# Patient Record
Sex: Female | Born: 1950
Health system: Southern US, Community
[De-identification: ages and names within clinical notes are randomized; demographics above are authoritative.]

## PROBLEM LIST (undated history)

## (undated) DIAGNOSIS — E119 Type 2 diabetes mellitus without complications: Secondary | ICD-10-CM

## (undated) DIAGNOSIS — I10 Essential (primary) hypertension: Secondary | ICD-10-CM

## (undated) DIAGNOSIS — F32A Depression, unspecified: Secondary | ICD-10-CM

## (undated) DIAGNOSIS — I2510 Atherosclerotic heart disease of native coronary artery without angina pectoris: Secondary | ICD-10-CM

## (undated) DIAGNOSIS — N182 Chronic kidney disease, stage 2 (mild): Secondary | ICD-10-CM

## (undated) DIAGNOSIS — I219 Acute myocardial infarction, unspecified: Secondary | ICD-10-CM

## (undated) DIAGNOSIS — F329 Major depressive disorder, single episode, unspecified: Secondary | ICD-10-CM

## (undated) DIAGNOSIS — G4733 Obstructive sleep apnea (adult) (pediatric): Secondary | ICD-10-CM

## (undated) DIAGNOSIS — F419 Anxiety disorder, unspecified: Secondary | ICD-10-CM

## (undated) DIAGNOSIS — G43909 Migraine, unspecified, not intractable, without status migrainosus: Secondary | ICD-10-CM

## (undated) DIAGNOSIS — D649 Anemia, unspecified: Secondary | ICD-10-CM

## (undated) DIAGNOSIS — J189 Pneumonia, unspecified organism: Secondary | ICD-10-CM

## (undated) DIAGNOSIS — M1990 Unspecified osteoarthritis, unspecified site: Secondary | ICD-10-CM

## (undated) DIAGNOSIS — R51 Headache: Secondary | ICD-10-CM

## (undated) DIAGNOSIS — K219 Gastro-esophageal reflux disease without esophagitis: Secondary | ICD-10-CM

## (undated) DIAGNOSIS — R0789 Other chest pain: Secondary | ICD-10-CM

## (undated) DIAGNOSIS — Z9189 Other specified personal risk factors, not elsewhere classified: Secondary | ICD-10-CM

## (undated) DIAGNOSIS — Z9989 Dependence on other enabling machines and devices: Secondary | ICD-10-CM

## (undated) DIAGNOSIS — H3500 Unspecified background retinopathy: Secondary | ICD-10-CM

## (undated) DIAGNOSIS — E785 Hyperlipidemia, unspecified: Secondary | ICD-10-CM

## (undated) DIAGNOSIS — E669 Obesity, unspecified: Secondary | ICD-10-CM

## (undated) DIAGNOSIS — G2581 Restless legs syndrome: Secondary | ICD-10-CM

## (undated) DIAGNOSIS — E079 Disorder of thyroid, unspecified: Secondary | ICD-10-CM

## (undated) DIAGNOSIS — I5042 Chronic combined systolic (congestive) and diastolic (congestive) heart failure: Secondary | ICD-10-CM

## (undated) DIAGNOSIS — N189 Chronic kidney disease, unspecified: Secondary | ICD-10-CM

## (undated) DIAGNOSIS — M8580 Other specified disorders of bone density and structure, unspecified site: Secondary | ICD-10-CM

## (undated) HISTORY — DX: Restless legs syndrome: G25.81

## (undated) HISTORY — DX: Other chest pain: R07.89

## (undated) HISTORY — PX: LAPAROSCOPIC CHOLECYSTECTOMY: SUR755

## (undated) HISTORY — DX: Chronic kidney disease, stage 2 (mild): N18.2

## (undated) HISTORY — DX: Unspecified background retinopathy: H35.00

## (undated) HISTORY — DX: Other specified personal risk factors, not elsewhere classified: Z91.89

## (undated) HISTORY — PX: CORONARY ANGIOPLASTY WITH STENT PLACEMENT: SHX49

## (undated) HISTORY — PX: CORONARY ARTERY BYPASS GRAFT: SHX141

## (undated) HISTORY — DX: Chronic combined systolic (congestive) and diastolic (congestive) heart failure: I50.42

## (undated) HISTORY — DX: Other specified disorders of bone density and structure, unspecified site: M85.80

---

## 1974-07-02 HISTORY — PX: TUBAL LIGATION: SHX77

## 1999-08-28 ENCOUNTER — Emergency Department (HOSPITAL_COMMUNITY): Admission: EM | Admit: 1999-08-28 | Discharge: 1999-08-28 | Payer: Self-pay | Admitting: Emergency Medicine

## 1999-08-28 ENCOUNTER — Encounter: Payer: Self-pay | Admitting: Emergency Medicine

## 2001-07-15 ENCOUNTER — Other Ambulatory Visit: Admission: RE | Admit: 2001-07-15 | Discharge: 2001-07-15 | Payer: Self-pay | Admitting: Family Medicine

## 2006-04-06 ENCOUNTER — Emergency Department (HOSPITAL_COMMUNITY): Admission: EM | Admit: 2006-04-06 | Discharge: 2006-04-07 | Payer: Self-pay | Admitting: Emergency Medicine

## 2006-06-17 ENCOUNTER — Ambulatory Visit (HOSPITAL_COMMUNITY): Admission: RE | Admit: 2006-06-17 | Discharge: 2006-06-18 | Payer: Self-pay | Admitting: Cardiology

## 2006-07-10 ENCOUNTER — Inpatient Hospital Stay (HOSPITAL_COMMUNITY): Admission: EM | Admit: 2006-07-10 | Discharge: 2006-07-13 | Payer: Self-pay | Admitting: Emergency Medicine

## 2006-07-21 ENCOUNTER — Inpatient Hospital Stay (HOSPITAL_COMMUNITY): Admission: EM | Admit: 2006-07-21 | Discharge: 2006-07-30 | Payer: Self-pay | Admitting: Emergency Medicine

## 2006-07-23 ENCOUNTER — Encounter: Payer: Self-pay | Admitting: Vascular Surgery

## 2006-08-22 ENCOUNTER — Ambulatory Visit: Payer: Self-pay | Admitting: Cardiothoracic Surgery

## 2006-08-29 ENCOUNTER — Encounter (HOSPITAL_COMMUNITY): Admission: RE | Admit: 2006-08-29 | Discharge: 2006-11-27 | Payer: Self-pay | Admitting: Cardiology

## 2006-09-03 ENCOUNTER — Ambulatory Visit: Payer: Self-pay | Admitting: Thoracic Surgery (Cardiothoracic Vascular Surgery)

## 2006-09-11 ENCOUNTER — Encounter: Admission: RE | Admit: 2006-09-11 | Discharge: 2006-09-11 | Payer: Self-pay | Admitting: Surgery

## 2006-09-17 ENCOUNTER — Ambulatory Visit: Payer: Self-pay | Admitting: Oncology

## 2006-09-30 ENCOUNTER — Ambulatory Visit (HOSPITAL_COMMUNITY): Admission: RE | Admit: 2006-09-30 | Discharge: 2006-09-30 | Payer: Self-pay | Admitting: Surgery

## 2006-10-21 LAB — CBC WITH DIFFERENTIAL/PLATELET
BASO%: 0.2 % (ref 0.0–2.0)
EOS%: 2.2 % (ref 0.0–7.0)
HGB: 12.3 g/dL (ref 11.6–15.9)
LYMPH%: 20.2 % (ref 14.0–48.0)
MCV: 84.7 fL (ref 81.0–101.0)
NEUT#: 5.4 10*3/uL (ref 1.5–6.5)
NEUT%: 73.8 % (ref 39.6–76.8)
RBC: 4.15 10*6/uL (ref 3.70–5.32)
WBC: 7.3 10*3/uL (ref 3.9–10.0)

## 2006-10-21 LAB — ERYTHROCYTE SEDIMENTATION RATE: Sed Rate: 23 mm/hr (ref 0–30)

## 2006-10-24 LAB — EPSTEIN-BARR VIRUS EARLY D ANTIGEN ANTIBODY, IGG: EBV EA IgG: 0.77 {ISR}

## 2006-10-24 LAB — COMPREHENSIVE METABOLIC PANEL
ALT: 11 U/L (ref 0–35)
AST: 9 U/L (ref 0–37)
CO2: 19 mEq/L (ref 19–32)
Calcium: 9.3 mg/dL (ref 8.4–10.5)
Chloride: 107 mEq/L (ref 96–112)
Potassium: 4.7 mEq/L (ref 3.5–5.3)
Sodium: 140 mEq/L (ref 135–145)
Total Protein: 6.6 g/dL (ref 6.0–8.3)

## 2006-10-24 LAB — LACTATE DEHYDROGENASE: LDH: 96 U/L (ref 94–250)

## 2006-10-24 LAB — BETA 2 MICROGLOBULIN, SERUM: Beta-2 Microglobulin: 2.86 mg/L — ABNORMAL HIGH (ref 1.01–1.73)

## 2006-10-24 LAB — CYTOMEGALOVIRUS ANTIBODY, IGG: Cytomegalovirus Ab-IgG: <0.8 Index (ref <0.80)

## 2006-10-24 LAB — EPSTEIN-BARR VIRUS VCA, IGM: EBV VCA IgM: 0.17 {ISR}

## 2006-10-24 LAB — HEPATITIS B SURFACE ANTIGEN: Hepatitis B Surface Ag: NEGATIVE

## 2006-10-24 LAB — CMV IGM: CMV IgM: <0.9 Index (ref <0.90)

## 2006-11-01 ENCOUNTER — Ambulatory Visit: Payer: Self-pay | Admitting: Oncology

## 2006-11-28 ENCOUNTER — Encounter (HOSPITAL_COMMUNITY): Admission: RE | Admit: 2006-11-28 | Discharge: 2007-02-26 | Payer: Self-pay | Admitting: Cardiology

## 2006-12-24 ENCOUNTER — Ambulatory Visit (HOSPITAL_COMMUNITY): Admission: RE | Admit: 2006-12-24 | Discharge: 2006-12-25 | Payer: Self-pay | Admitting: Cardiology

## 2007-02-18 ENCOUNTER — Observation Stay (HOSPITAL_COMMUNITY): Admission: EM | Admit: 2007-02-18 | Discharge: 2007-02-20 | Payer: Self-pay | Admitting: Emergency Medicine

## 2007-03-31 ENCOUNTER — Emergency Department (HOSPITAL_COMMUNITY): Admission: EM | Admit: 2007-03-31 | Discharge: 2007-03-31 | Payer: Self-pay | Admitting: Emergency Medicine

## 2007-04-25 ENCOUNTER — Ambulatory Visit: Payer: Self-pay | Admitting: Oncology

## 2007-05-01 ENCOUNTER — Ambulatory Visit (HOSPITAL_COMMUNITY): Admission: RE | Admit: 2007-05-01 | Discharge: 2007-05-01 | Payer: Self-pay | Admitting: Oncology

## 2007-05-01 LAB — CBC WITH DIFFERENTIAL/PLATELET
BASO%: 0.3 % (ref 0.0–2.0)
Basophils Absolute: 0 10*3/uL (ref 0.0–0.1)
HCT: 35 % (ref 34.8–46.6)
HGB: 12.5 g/dL (ref 11.6–15.9)
LYMPH%: 19.5 % (ref 14.0–48.0)
MCHC: 35.7 g/dL (ref 32.0–36.0)
MONO#: 0.3 10*3/uL (ref 0.1–0.9)
NEUT%: 74.5 % (ref 39.6–76.8)
Platelets: 274 10*3/uL (ref 145–400)
WBC: 7.1 10*3/uL (ref 3.9–10.0)
lymph#: 1.4 10*3/uL (ref 0.9–3.3)

## 2007-05-01 LAB — COMPREHENSIVE METABOLIC PANEL
BUN: 29 mg/dL — ABNORMAL HIGH (ref 6–23)
CO2: 31 mEq/L (ref 19–32)
Calcium: 9.8 mg/dL (ref 8.4–10.5)
Chloride: 97 mEq/L (ref 96–112)
Creatinine, Ser: 1.24 mg/dL — ABNORMAL HIGH (ref 0.40–1.20)
Glucose, Bld: 103 mg/dL — ABNORMAL HIGH (ref 70–99)
Total Bilirubin: 0.7 mg/dL (ref 0.3–1.2)

## 2007-05-14 ENCOUNTER — Inpatient Hospital Stay (HOSPITAL_COMMUNITY): Admission: EM | Admit: 2007-05-14 | Discharge: 2007-05-17 | Payer: Self-pay | Admitting: *Deleted

## 2007-06-02 DIAGNOSIS — H3500 Unspecified background retinopathy: Secondary | ICD-10-CM

## 2007-06-02 HISTORY — DX: Unspecified background retinopathy: H35.00

## 2007-06-24 ENCOUNTER — Ambulatory Visit: Payer: Self-pay | Admitting: Oncology

## 2007-09-05 ENCOUNTER — Ambulatory Visit (HOSPITAL_COMMUNITY): Admission: RE | Admit: 2007-09-05 | Discharge: 2007-09-05 | Payer: Self-pay | Admitting: Cardiology

## 2007-10-28 ENCOUNTER — Ambulatory Visit (HOSPITAL_COMMUNITY): Admission: RE | Admit: 2007-10-28 | Discharge: 2007-10-28 | Payer: Self-pay | Admitting: Oncology

## 2007-10-29 ENCOUNTER — Ambulatory Visit: Payer: Self-pay | Admitting: Oncology

## 2008-01-31 ENCOUNTER — Ambulatory Visit: Payer: Self-pay | Admitting: Cardiology

## 2008-02-01 ENCOUNTER — Inpatient Hospital Stay (HOSPITAL_COMMUNITY): Admission: EM | Admit: 2008-02-01 | Discharge: 2008-02-04 | Payer: Self-pay | Admitting: Emergency Medicine

## 2008-02-03 ENCOUNTER — Ambulatory Visit: Admission: RE | Admit: 2008-02-03 | Discharge: 2008-02-03 | Payer: Self-pay | Admitting: Cardiology

## 2008-02-03 HISTORY — PX: CARDIAC CATHETERIZATION: SHX172

## 2008-06-05 ENCOUNTER — Emergency Department (HOSPITAL_COMMUNITY): Admission: EM | Admit: 2008-06-05 | Discharge: 2008-06-05 | Payer: Self-pay | Admitting: Family Medicine

## 2008-06-22 ENCOUNTER — Encounter (HOSPITAL_COMMUNITY): Admission: RE | Admit: 2008-06-22 | Discharge: 2008-06-29 | Payer: Self-pay | Admitting: Internal Medicine

## 2008-07-01 ENCOUNTER — Emergency Department (HOSPITAL_COMMUNITY): Admission: EM | Admit: 2008-07-01 | Discharge: 2008-07-01 | Payer: Self-pay | Admitting: Emergency Medicine

## 2008-07-05 ENCOUNTER — Encounter: Admission: RE | Admit: 2008-07-05 | Discharge: 2008-07-05 | Payer: Self-pay | Admitting: Internal Medicine

## 2008-07-20 ENCOUNTER — Other Ambulatory Visit: Admission: RE | Admit: 2008-07-20 | Discharge: 2008-07-20 | Payer: Self-pay | Admitting: Interventional Radiology

## 2008-07-20 ENCOUNTER — Encounter: Admission: RE | Admit: 2008-07-20 | Discharge: 2008-07-20 | Payer: Self-pay | Admitting: Internal Medicine

## 2008-07-20 ENCOUNTER — Encounter (INDEPENDENT_AMBULATORY_CARE_PROVIDER_SITE_OTHER): Payer: Self-pay | Admitting: Interventional Radiology

## 2008-12-01 ENCOUNTER — Observation Stay (HOSPITAL_COMMUNITY): Admission: EM | Admit: 2008-12-01 | Discharge: 2008-12-05 | Payer: Self-pay | Admitting: Emergency Medicine

## 2008-12-03 ENCOUNTER — Encounter (INDEPENDENT_AMBULATORY_CARE_PROVIDER_SITE_OTHER): Payer: Self-pay | Admitting: Cardiology

## 2009-02-08 ENCOUNTER — Encounter: Admission: RE | Admit: 2009-02-08 | Discharge: 2009-02-08 | Payer: Self-pay | Admitting: Internal Medicine

## 2009-04-17 ENCOUNTER — Emergency Department (HOSPITAL_COMMUNITY): Admission: EM | Admit: 2009-04-17 | Discharge: 2009-04-17 | Payer: Self-pay | Admitting: Emergency Medicine

## 2009-05-13 ENCOUNTER — Observation Stay (HOSPITAL_COMMUNITY): Admission: AD | Admit: 2009-05-13 | Discharge: 2009-05-14 | Payer: Self-pay | Admitting: Surgery

## 2009-05-13 ENCOUNTER — Encounter (INDEPENDENT_AMBULATORY_CARE_PROVIDER_SITE_OTHER): Payer: Self-pay | Admitting: Surgery

## 2009-07-04 ENCOUNTER — Emergency Department (HOSPITAL_COMMUNITY): Admission: EM | Admit: 2009-07-04 | Discharge: 2009-07-05 | Payer: Self-pay | Admitting: Emergency Medicine

## 2009-08-04 ENCOUNTER — Encounter (HOSPITAL_COMMUNITY): Admission: RE | Admit: 2009-08-04 | Discharge: 2009-10-06 | Payer: Self-pay | Admitting: Internal Medicine

## 2009-11-30 ENCOUNTER — Observation Stay (HOSPITAL_COMMUNITY): Admission: EM | Admit: 2009-11-30 | Discharge: 2009-12-01 | Payer: Self-pay | Admitting: Emergency Medicine

## 2010-04-30 ENCOUNTER — Emergency Department (HOSPITAL_COMMUNITY): Admission: EM | Admit: 2010-04-30 | Discharge: 2010-04-30 | Payer: Self-pay | Admitting: Emergency Medicine

## 2010-08-28 ENCOUNTER — Other Ambulatory Visit: Payer: Self-pay | Admitting: Gastroenterology

## 2010-09-01 ENCOUNTER — Ambulatory Visit
Admission: RE | Admit: 2010-09-01 | Discharge: 2010-09-01 | Disposition: A | Discharge status: Home / Self Care | Payer: BC Managed Care – PPO | Source: Ambulatory Visit | Attending: Gastroenterology | Admitting: Gastroenterology

## 2010-09-13 LAB — BASIC METABOLIC PANEL
BUN: 12 mg/dL (ref 6–23)
Calcium: 9.3 mg/dL (ref 8.4–10.5)
Creatinine, Ser: 0.88 mg/dL (ref 0.4–1.2)
GFR calc non Af Amer: >60 mL/min (ref >60)
Potassium: 4.7 mEq/L (ref 3.5–5.1)

## 2010-09-13 LAB — CBC
MCHC: 33 g/dL (ref 30.0–36.0)
Platelets: 197 10*3/uL (ref 150–400)
RDW: 13.8 % (ref 11.5–15.5)
WBC: 8.6 10*3/uL (ref 4.0–10.5)

## 2010-09-13 LAB — DIFFERENTIAL
Basophils Absolute: 0 10*3/uL (ref 0.0–0.1)
Basophils Relative: 0 % (ref 0–1)
Lymphocytes Relative: 31 % (ref 12–46)
Neutro Abs: 5.4 10*3/uL (ref 1.7–7.7)
Neutrophils Relative %: 63 % (ref 43–77)

## 2010-09-18 LAB — CBC
HCT: 33.1 % — ABNORMAL LOW (ref 36.0–46.0)
HCT: 34.8 % — ABNORMAL LOW (ref 36.0–46.0)
MCHC: 34.2 g/dL (ref 30.0–36.0)
MCV: 93 fL (ref 78.0–100.0)
MCV: 93.4 fL (ref 78.0–100.0)
Platelets: 212 10*3/uL (ref 150–400)
Platelets: 227 10*3/uL (ref 150–400)
RBC: 3.57 MIL/uL — ABNORMAL LOW (ref 3.87–5.11)
RBC: 3.72 MIL/uL — ABNORMAL LOW (ref 3.87–5.11)
WBC: 9.3 10*3/uL (ref 4.0–10.5)

## 2010-09-18 LAB — CARDIAC PANEL(CRET KIN+CKTOT+MB+TROPI)
Relative Index: INVALID (ref 0.0–2.5)
Relative Index: INVALID (ref 0.0–2.5)
Troponin I: <0.01 ng/mL (ref 0.00–0.06)

## 2010-09-18 LAB — DIFFERENTIAL
Eosinophils Absolute: 0.1 10*3/uL (ref 0.0–0.7)
Eosinophils Relative: 1 % (ref 0–5)
Lymphocytes Relative: 23 % (ref 12–46)
Lymphs Abs: 2.1 10*3/uL (ref 0.7–4.0)
Monocytes Relative: 5 % (ref 3–12)
Neutrophils Relative %: 70 % (ref 43–77)

## 2010-09-18 LAB — POCT CARDIAC MARKERS
CKMB, poc: <1 ng/mL — ABNORMAL LOW (ref 1.0–8.0)
Troponin i, poc: <0.05 ng/mL (ref 0.00–0.09)

## 2010-09-18 LAB — COMPREHENSIVE METABOLIC PANEL
AST: 14 U/L (ref 0–37)
BUN: 16 mg/dL (ref 6–23)
CO2: 29 mEq/L (ref 19–32)
Calcium: 9.1 mg/dL (ref 8.4–10.5)
Creatinine, Ser: 1.1 mg/dL (ref 0.4–1.2)
GFR calc Af Amer: >60 mL/min (ref >60)
GFR calc non Af Amer: 51 mL/min — ABNORMAL LOW (ref >60)

## 2010-09-18 LAB — CK TOTAL AND CKMB (NOT AT ARMC)
CK, MB: 0.6 ng/mL (ref 0.3–4.0)
Total CK: 19 U/L (ref 7–177)

## 2010-09-18 LAB — BASIC METABOLIC PANEL
Chloride: 104 mEq/L (ref 96–112)
GFR calc Af Amer: >60 mL/min (ref >60)
GFR calc non Af Amer: 50 mL/min — ABNORMAL LOW (ref >60)
Potassium: 3.9 mEq/L (ref 3.5–5.1)

## 2010-09-18 LAB — PROTIME-INR: INR: 0.95 (ref 0.00–1.49)

## 2010-09-18 LAB — GLUCOSE, CAPILLARY
Glucose-Capillary: 101 mg/dL — ABNORMAL HIGH (ref 70–99)
Glucose-Capillary: 80 mg/dL (ref 70–99)
Glucose-Capillary: 97 mg/dL (ref 70–99)

## 2010-09-18 LAB — TSH: TSH: 0.986 u[IU]/mL (ref 0.350–4.500)

## 2010-10-01 HISTORY — PX: COLONOSCOPY: SHX174

## 2010-10-04 LAB — DIFFERENTIAL
Basophils Absolute: 0 10*3/uL (ref 0.0–0.1)
Lymphocytes Relative: 36 % (ref 12–46)
Lymphs Abs: 2.8 10*3/uL (ref 0.7–4.0)
Neutrophils Relative %: 57 % (ref 43–77)

## 2010-10-04 LAB — CBC
HCT: 34 % — ABNORMAL LOW (ref 36.0–46.0)
Hemoglobin: 11.7 g/dL — ABNORMAL LOW (ref 12.0–15.0)
MCHC: 34.5 g/dL (ref 30.0–36.0)
MCV: 86 fL (ref 78.0–100.0)
RBC: 3.95 MIL/uL (ref 3.87–5.11)
WBC: 8 10*3/uL (ref 4.0–10.5)

## 2010-10-04 LAB — COMPREHENSIVE METABOLIC PANEL
BUN: 15 mg/dL (ref 6–23)
CO2: 28 mEq/L (ref 19–32)
Calcium: 9.1 mg/dL (ref 8.4–10.5)
Chloride: 102 mEq/L (ref 96–112)
Creatinine, Ser: 1.14 mg/dL (ref 0.4–1.2)
GFR calc non Af Amer: 49 mL/min — ABNORMAL LOW (ref >60)
Glucose, Bld: 142 mg/dL — ABNORMAL HIGH (ref 70–99)
Total Bilirubin: 0.8 mg/dL (ref 0.3–1.2)

## 2010-10-04 LAB — GLUCOSE, CAPILLARY
Glucose-Capillary: 151 mg/dL — ABNORMAL HIGH (ref 70–99)
Glucose-Capillary: 88 mg/dL (ref 70–99)

## 2010-10-04 LAB — LIPASE, BLOOD: Lipase: 44 U/L (ref 11–59)

## 2010-10-05 LAB — COMPREHENSIVE METABOLIC PANEL
ALT: 54 U/L — ABNORMAL HIGH (ref 0–35)
AST: 74 U/L — ABNORMAL HIGH (ref 0–37)
Albumin: 3.2 g/dL — ABNORMAL LOW (ref 3.5–5.2)
Alkaline Phosphatase: 137 U/L — ABNORMAL HIGH (ref 39–117)
GFR calc Af Amer: >60 mL/min (ref >60)
Glucose, Bld: 87 mg/dL (ref 70–99)
Potassium: 4.2 mEq/L (ref 3.5–5.1)
Sodium: 140 mEq/L (ref 135–145)
Total Protein: 6 g/dL (ref 6.0–8.3)

## 2010-10-05 LAB — POCT CARDIAC MARKERS
CKMB, poc: <1 ng/mL — ABNORMAL LOW (ref 1.0–8.0)
Myoglobin, poc: 44.2 ng/mL (ref 12–200)
Troponin i, poc: <0.05 ng/mL (ref 0.00–0.09)

## 2010-10-05 LAB — DIFFERENTIAL
Basophils Relative: 0 % (ref 0–1)
Eosinophils Absolute: 0 10*3/uL (ref 0.0–0.7)
Eosinophils Relative: 0 % (ref 0–5)
Monocytes Absolute: 0.2 10*3/uL (ref 0.1–1.0)
Monocytes Relative: 5 % (ref 3–12)
Neutrophils Relative %: 52 % (ref 43–77)

## 2010-10-05 LAB — URINE CULTURE

## 2010-10-05 LAB — URINALYSIS, ROUTINE W REFLEX MICROSCOPIC
Bilirubin Urine: NEGATIVE
Glucose, UA: NEGATIVE mg/dL
Hgb urine dipstick: NEGATIVE
Ketones, ur: NEGATIVE mg/dL
Protein, ur: NEGATIVE mg/dL

## 2010-10-05 LAB — URINE MICROSCOPIC-ADD ON

## 2010-10-05 LAB — CBC
HCT: 32.3 % — ABNORMAL LOW (ref 36.0–46.0)
Hemoglobin: 11 g/dL — ABNORMAL LOW (ref 12.0–15.0)
MCV: 88.1 fL (ref 78.0–100.0)
Platelets: 108 10*3/uL — ABNORMAL LOW (ref 150–400)
RDW: 15.5 % (ref 11.5–15.5)
WBC: 5 10*3/uL (ref 4.0–10.5)

## 2010-10-05 LAB — GLUCOSE, CAPILLARY: Glucose-Capillary: 107 mg/dL — ABNORMAL HIGH (ref 70–99)

## 2010-10-09 LAB — GLUCOSE, CAPILLARY
Glucose-Capillary: 109 mg/dL — ABNORMAL HIGH (ref 70–99)
Glucose-Capillary: 141 mg/dL — ABNORMAL HIGH (ref 70–99)
Glucose-Capillary: 145 mg/dL — ABNORMAL HIGH (ref 70–99)
Glucose-Capillary: 152 mg/dL — ABNORMAL HIGH (ref 70–99)
Glucose-Capillary: 163 mg/dL — ABNORMAL HIGH (ref 70–99)
Glucose-Capillary: 163 mg/dL — ABNORMAL HIGH (ref 70–99)
Glucose-Capillary: 193 mg/dL — ABNORMAL HIGH (ref 70–99)
Glucose-Capillary: 93 mg/dL (ref 70–99)

## 2010-10-09 LAB — CBC
HCT: 37.1 % (ref 36.0–46.0)
Hemoglobin: 11.6 g/dL — ABNORMAL LOW (ref 12.0–15.0)
MCHC: 33.7 g/dL (ref 30.0–36.0)
MCHC: 33.9 g/dL (ref 30.0–36.0)
MCHC: 33.9 g/dL (ref 30.0–36.0)
MCHC: 34.4 g/dL (ref 30.0–36.0)
MCV: 90 fL (ref 78.0–100.0)
MCV: 90.4 fL (ref 78.0–100.0)
Platelets: 222 10*3/uL (ref 150–400)
Platelets: 241 10*3/uL (ref 150–400)
Platelets: 267 10*3/uL (ref 150–400)
RBC: 3.57 MIL/uL — ABNORMAL LOW (ref 3.87–5.11)
RBC: 4.11 MIL/uL (ref 3.87–5.11)
RDW: 14.4 % (ref 11.5–15.5)
WBC: 10.7 10*3/uL — ABNORMAL HIGH (ref 4.0–10.5)
WBC: 9.4 10*3/uL (ref 4.0–10.5)
WBC: 9.5 10*3/uL (ref 4.0–10.5)

## 2010-10-09 LAB — PROTIME-INR: Prothrombin Time: 12.8 seconds (ref 11.6–15.2)

## 2010-10-09 LAB — BASIC METABOLIC PANEL
BUN: 14 mg/dL (ref 6–23)
BUN: 18 mg/dL (ref 6–23)
CO2: 31 mEq/L (ref 19–32)
Calcium: 9.2 mg/dL (ref 8.4–10.5)
Calcium: 9.3 mg/dL (ref 8.4–10.5)
Chloride: 98 mEq/L (ref 96–112)
Creatinine, Ser: 0.92 mg/dL (ref 0.4–1.2)
Creatinine, Ser: 1.15 mg/dL (ref 0.4–1.2)
GFR calc Af Amer: 58 mL/min — ABNORMAL LOW (ref >60)
GFR calc Af Amer: >60 mL/min (ref >60)
GFR calc non Af Amer: >60 mL/min (ref >60)

## 2010-10-09 LAB — LIPID PANEL
HDL: 38 mg/dL — ABNORMAL LOW (ref >39)
Total CHOL/HDL Ratio: 3.3 RATIO

## 2010-10-09 LAB — COMPREHENSIVE METABOLIC PANEL
ALT: 12 U/L (ref 0–35)
AST: 21 U/L (ref 0–37)
CO2: 29 mEq/L (ref 19–32)
Chloride: 102 mEq/L (ref 96–112)
Creatinine, Ser: 1.42 mg/dL — ABNORMAL HIGH (ref 0.4–1.2)
GFR calc Af Amer: 46 mL/min — ABNORMAL LOW (ref >60)
GFR calc non Af Amer: 38 mL/min — ABNORMAL LOW (ref >60)
Total Bilirubin: 0.6 mg/dL (ref 0.3–1.2)

## 2010-10-09 LAB — CK TOTAL AND CKMB (NOT AT ARMC)
CK, MB: 0.6 ng/mL (ref 0.3–4.0)
Relative Index: INVALID (ref 0.0–2.5)
Relative Index: INVALID (ref 0.0–2.5)

## 2010-10-09 LAB — HEPARIN LEVEL (UNFRACTIONATED)
Heparin Unfractionated: 0.3 IU/mL (ref 0.30–0.70)
Heparin Unfractionated: 0.38 IU/mL (ref 0.30–0.70)
Heparin Unfractionated: 0.97 IU/mL — ABNORMAL HIGH (ref 0.30–0.70)

## 2010-10-09 LAB — DIFFERENTIAL
Basophils Absolute: 0 10*3/uL (ref 0.0–0.1)
Eosinophils Absolute: 0.2 10*3/uL (ref 0.0–0.7)
Eosinophils Relative: 2 % (ref 0–5)

## 2010-10-09 LAB — TROPONIN I: Troponin I: 0.01 ng/mL (ref 0.00–0.06)

## 2010-10-14 ENCOUNTER — Observation Stay (HOSPITAL_COMMUNITY)
Admission: EM | Admit: 2010-10-14 | Discharge: 2010-10-19 | DRG: 813 | Disposition: A | Discharge status: Home / Self Care | Payer: BC Managed Care – PPO | Attending: Gastroenterology | Admitting: Gastroenterology

## 2010-10-14 DIAGNOSIS — I2510 Atherosclerotic heart disease of native coronary artery without angina pectoris: Secondary | ICD-10-CM | POA: Insufficient documentation

## 2010-10-14 DIAGNOSIS — R599 Enlarged lymph nodes, unspecified: Secondary | ICD-10-CM | POA: Insufficient documentation

## 2010-10-14 DIAGNOSIS — E119 Type 2 diabetes mellitus without complications: Secondary | ICD-10-CM | POA: Insufficient documentation

## 2010-10-14 DIAGNOSIS — R112 Nausea with vomiting, unspecified: Secondary | ICD-10-CM | POA: Insufficient documentation

## 2010-10-14 DIAGNOSIS — R1310 Dysphagia, unspecified: Secondary | ICD-10-CM | POA: Insufficient documentation

## 2010-10-14 DIAGNOSIS — K449 Diaphragmatic hernia without obstruction or gangrene: Secondary | ICD-10-CM | POA: Insufficient documentation

## 2010-10-14 DIAGNOSIS — E0500 Thyrotoxicosis with diffuse goiter without thyrotoxic crisis or storm: Secondary | ICD-10-CM | POA: Insufficient documentation

## 2010-10-14 DIAGNOSIS — T40605A Adverse effect of unspecified narcotics, initial encounter: Secondary | ICD-10-CM | POA: Insufficient documentation

## 2010-10-14 DIAGNOSIS — Z9861 Coronary angioplasty status: Secondary | ICD-10-CM | POA: Insufficient documentation

## 2010-10-14 DIAGNOSIS — R1031 Right lower quadrant pain: Principal | ICD-10-CM | POA: Insufficient documentation

## 2010-10-15 ENCOUNTER — Emergency Department (HOSPITAL_COMMUNITY): Payer: BC Managed Care – PPO

## 2010-10-15 LAB — GLUCOSE, CAPILLARY
Glucose-Capillary: 225 mg/dL — ABNORMAL HIGH (ref 70–99)
Glucose-Capillary: 154 mg/dL — ABNORMAL HIGH (ref 70–99)

## 2010-10-15 LAB — CBC
MCH: 29.1 pg (ref 26.0–34.0)
MCHC: 34.8 g/dL (ref 30.0–36.0)
MCV: 83.7 fL (ref 78.0–100.0)
Platelets: 224 10*3/uL (ref 150–400)
Platelets: 212 10*3/uL (ref 150–400)
RBC: 4.18 MIL/uL (ref 3.87–5.11)
RBC: 3.98 MIL/uL (ref 3.87–5.11)
RDW: 13.6 % (ref 11.5–15.5)
WBC: 10 10*3/uL (ref 4.0–10.5)

## 2010-10-15 LAB — DIFFERENTIAL
Basophils Absolute: 0 10*3/uL (ref 0.0–0.1)
Eosinophils Absolute: 0.2 10*3/uL (ref 0.0–0.7)
Eosinophils Relative: 2 % (ref 0–5)
Neutrophils Relative %: 64 % (ref 43–77)

## 2010-10-15 LAB — COMPREHENSIVE METABOLIC PANEL
AST: 20 U/L (ref 0–37)
Albumin: 3.5 g/dL (ref 3.5–5.2)
BUN: 10 mg/dL (ref 6–23)
Calcium: 8.8 mg/dL (ref 8.4–10.5)
Chloride: 98 mEq/L (ref 96–112)
Creatinine, Ser: 0.81 mg/dL (ref 0.4–1.2)
GFR calc Af Amer: >60 mL/min (ref >60)
GFR calc non Af Amer: >60 mL/min (ref >60)
Total Bilirubin: 0.5 mg/dL (ref 0.3–1.2)

## 2010-10-15 LAB — BASIC METABOLIC PANEL
Chloride: 103 mEq/L (ref 96–112)
GFR calc Af Amer: >60 mL/min (ref >60)
GFR calc non Af Amer: 60 mL/min — ABNORMAL LOW (ref >60)
Potassium: 3.2 mEq/L — ABNORMAL LOW (ref 3.5–5.1)
Sodium: 140 mEq/L (ref 135–145)

## 2010-10-15 LAB — URINALYSIS, ROUTINE W REFLEX MICROSCOPIC
Ketones, ur: NEGATIVE mg/dL
Nitrite: NEGATIVE
Specific Gravity, Urine: 1.013 (ref 1.005–1.030)
pH: 5 (ref 5.0–8.0)

## 2010-10-15 MED ORDER — IOHEXOL 300 MG/ML  SOLN
100.0000 mL | Freq: Once | INTRAMUSCULAR | Status: AC | PRN
  Administered 2010-10-15: 100 mL via INTRAVENOUS

## 2010-10-16 LAB — CBC
HCT: 33.6 % — ABNORMAL LOW (ref 36.0–46.0)
Hemoglobin: 11.6 g/dL — ABNORMAL LOW (ref 12.0–15.0)
MCH: 29.1 pg (ref 26.0–34.0)
MCHC: 34.5 g/dL (ref 30.0–36.0)
MCV: 84.2 fL (ref 78.0–100.0)
Platelets: 202 10*3/uL (ref 150–400)
RBC: 3.99 MIL/uL (ref 3.87–5.11)
RDW: 13.3 % (ref 11.5–15.5)
WBC: 7.9 10*3/uL (ref 4.0–10.5)

## 2010-10-16 LAB — GLUCOSE, CAPILLARY
Glucose-Capillary: 170 mg/dL — ABNORMAL HIGH (ref 70–99)
Glucose-Capillary: 214 mg/dL — ABNORMAL HIGH (ref 70–99)
Glucose-Capillary: 249 mg/dL — ABNORMAL HIGH (ref 70–99)
Glucose-Capillary: 142 mg/dL — ABNORMAL HIGH (ref 70–99)

## 2010-10-16 LAB — BASIC METABOLIC PANEL
BUN: 7 mg/dL (ref 6–23)
Calcium: 8.7 mg/dL (ref 8.4–10.5)
GFR calc non Af Amer: >60 mL/min (ref >60)
Glucose, Bld: 153 mg/dL — ABNORMAL HIGH (ref 70–99)

## 2010-10-16 LAB — URINE CULTURE: Culture  Setup Time: 201204151201

## 2010-10-17 LAB — BASIC METABOLIC PANEL
BUN: 4 mg/dL — ABNORMAL LOW (ref 6–23)
Calcium: 8.7 mg/dL (ref 8.4–10.5)
Creatinine, Ser: 0.91 mg/dL (ref 0.4–1.2)
GFR calc non Af Amer: >60 mL/min (ref >60)

## 2010-10-17 LAB — GLUCOSE, CAPILLARY
Glucose-Capillary: 167 mg/dL — ABNORMAL HIGH (ref 70–99)
Glucose-Capillary: 199 mg/dL — ABNORMAL HIGH (ref 70–99)

## 2010-10-18 LAB — BASIC METABOLIC PANEL
BUN: 7 mg/dL (ref 6–23)
Calcium: 9.1 mg/dL (ref 8.4–10.5)
Chloride: 103 mEq/L (ref 96–112)
Creatinine, Ser: 1.12 mg/dL (ref 0.4–1.2)

## 2010-10-18 LAB — GLUCOSE, CAPILLARY
Glucose-Capillary: 211 mg/dL — ABNORMAL HIGH (ref 70–99)
Glucose-Capillary: 131 mg/dL — ABNORMAL HIGH (ref 70–99)
Glucose-Capillary: 134 mg/dL — ABNORMAL HIGH (ref 70–99)

## 2010-10-18 LAB — CBC
MCH: 28.7 pg (ref 26.0–34.0)
MCHC: 33.3 g/dL (ref 30.0–36.0)
MCV: 86.1 fL (ref 78.0–100.0)
Platelets: 183 10*3/uL (ref 150–400)
RBC: 3.73 MIL/uL — ABNORMAL LOW (ref 3.87–5.11)

## 2010-10-19 LAB — CBC
HCT: 32.4 % — ABNORMAL LOW (ref 36.0–46.0)
Platelets: 188 10*3/uL (ref 150–400)
RDW: 13.7 % (ref 11.5–15.5)
WBC: 7.4 10*3/uL (ref 4.0–10.5)

## 2010-10-19 LAB — BASIC METABOLIC PANEL
GFR calc non Af Amer: 54 mL/min — ABNORMAL LOW (ref >60)
Potassium: 4.3 mEq/L (ref 3.5–5.1)
Sodium: 138 mEq/L (ref 135–145)

## 2010-10-19 LAB — GLUCOSE, CAPILLARY: Glucose-Capillary: 205 mg/dL — ABNORMAL HIGH (ref 70–99)

## 2010-10-20 NOTE — Consult Note (Signed)
  NAMEMAHINA, SALATINO                 ACCOUNT NO.:  [id number]  MEDICAL RECORD NO.:  [id number]           PATIENT TYPE:  LOCATION:                                 FACILITY:  PHYSICIAN:  Gabrielle Dare. Janee Morn, M.D.DATE OF BIRTH:  12/02/1950  DATE OF CONSULTATION:  10/15/2010 DATE OF DISCHARGE:                                CONSULTATION   CHIEF COMPLAINT:  Abdominal pain.  HISTORY OF PRESENT ILLNESS:  [name] is a 60 year old white female who is status post colonoscopy and EGD on October 11, 2010, done by Dr. Carman Ching.  The patient had removal of one rectal polyp at that time.  She initially did very well.  She was eating, moving her bowels, but she developed acute onset of right lower quadrant abdominal pain last night at 7 p.m. while out shopping.  She came to the emergency department for further evaluation after speaking with Dr. Randa Evens and she underwent CT scan of the abdomen and pelvis.  This demonstrated good visualization of the appendix which is normal and obtained here as well.  There is no evidence on CT scan of complication from a colonoscopy.  She does, however, have multiple enlarged mesenteric lymph nodes and retroperitoneal lymphadenopathy of unknown etiology.  She developed some nausea and vomiting right after receiving some Dilaudid on the floor after admission but otherwise had not been vomiting.  PAST MEDICAL HISTORY:  Type 2 diabetes mellitus, coronary artery disease with stent, and Graves disease.  PAST SURGICAL HISTORY:  Coronary artery bypass grafting, cholecystectomy, and bilateral tubal ligation.  ALLERGIES:  No known drug allergies.  CURRENT MEDICATIONS:  Cipro and Flagyl IV.  REVIEW OF SYSTEMS:  GI complaints as above with the addition of the nausea and vomiting after administration of Dilaudid.  For cardiac, she does have history of coronary artery disease.  She has no current chest pain or complaints from that.  Remainder of the system review  is unremarkable.  PHYSICAL EXAMINATION:  GENERAL:  She is awake and alert. NECK:  Supple. LUNGS:  Clear to auscultation. HEART:  Regular with no murmurs. ABDOMEN:  Soft.  She does have some tenderness intermittently in the right lower quadrant.  There is no guarding present.  There is no generalized tenderness.  There are no peritoneal signs.  Bowel sounds are present.  Liver function tests within normal limits.  White blood cell count 9.4.  IMPRESSION:  Right lower quadrant abdominal pain 4 days status post colonoscopy of uncertain etiology.  Her appendix clearly looks normal on CAT scan, and there are no complicating features involving her colon noted.  It is uncertain what the etiology of her lymphadenopathy is. There is no current need for emergent surgery at this time; however, we will follow her closely with you and I discussed this case with Dr. Randa Evens.     Gabrielle Dare Janee Morn, M.D.     BET/MEDQ  D:  10/15/2010  T:  10/16/2010  Job:  [id number]  cc:   Fayrene Fearing L. Malon Kindle., M.D.  Electronically Signed by Violeta Gelinas M.D. on 10/20/2010 10:57:24 AM

## 2010-10-22 NOTE — Discharge Summary (Signed)
  [name], [name]                 ACCOUNT NO.:  [id number]  MEDICAL RECORD NO.:  [id number]           PATIENT TYPE:  I  LOCATION:  2024                         FACILITY:  [facility]  PHYSICIAN:  Shirley Friar, MDDATE OF BIRTH:  23-Aug-1950  DATE OF ADMISSION:  10/14/2010 DATE OF DISCHARGE:  10/19/2010                              DISCHARGE SUMMARY   DISCHARGE DIAGNOSIS:  Right lower quadrant abdominal pain.  HISTORY OF PRESENT ILLNESS:  [name] is a pleasant 60 year old female who had a sudden onset of right lower quadrant abdominal pain 4 days after an upper endoscopy and colonoscopy.  She had a 1-cm sessile polyp removed from the rectum during her colonoscopy and felt well until 4 days following the procedure when she developed severe right lower quadrant abdominal pain.  The procedure was done by Dr. Randa Evens and he was on-call over the weekend and the patient was admitted for further evaluation.  Her CT scan on presentation showed no evidence for appendicitis and a normal-appearing colon.  She did have increased retroperitoneal adenopathy and otherwise no other significant findings. She was medically managed with IV antibiotics, IV fluids, and bowel rest initially.  Her right lower quadrant pain improved during her hospitalization.  She did have episodes of nausea and vomiting during hospitalization that resolved after medical management.  She had a bloody loose stool on one day and hemoglobin decreased from 11.6 to 10.7 but on the day of discharge, her hemoglobin was back to 11.0.  She also had an episode of hypokalemia with a potassium down to 2.9 that was repleted and following repletion of her potassium, her potassium remained in the normal range. She had no further bloody stools and was tolerating a diabetic diet on the day of discharge.  She was also ambulating well without any difficulty.  She does describe some mild right lower quadrant pain, it has improved since  admission.  DISCHARGE ACTIVITY:  No heavy lifting until further instruction, increase activity slowly.  DISCHARGE DIET:  Diabetic diet.  CONDITION:  Improved.  DISPOSITION:  Discharged home.  DISCHARGE MEDICATIONS: 1. Cipro 500 mg p.o. b.i.d. x7 days. 2. Flagyl 500 mg p.o. t.i.d. x7 days. 3. Vicodin 5/325 mg 1-2 tablets p.o. q.6 hours as needed for pain. 4. Aspirin 325 mg p.o. daily. 5. Citalopram 20 mg p.o. daily. 6. Flexeril 10 mg p.o. daily as needed. 7. Metformin 1000 mg p.o. b.i.d. 8. Metoprolol 12.5 mg p.o. b.i.d. 9. Omeprazole 20 mg p.o. daily 10.Simvastatin 40 mg p.o. at bedtime. 11.The patient was instructed to stop her Tylenol for right now.  FOLLOWUP:  Follow up with Dr. Randa Evens on Nov 01, 2010, at 3:30 p.m.     Shirley Friar, MD     VCS/MEDQ  D:  10/19/2010  T:  10/19/2010  Job:  [id number]  cc:   Fayrene Fearing L. Malon Kindle., M.D. Dario Guardian, M.D. Armanda Magic, M.D.  Electronically Signed by Charlott Rakes MD on 10/22/2010 12:44:46 PM

## 2010-10-30 NOTE — H&P (Signed)
NAMEDUSTEE, BOTTENFIELD                 ACCOUNT NO.:  [id number]  MEDICAL RECORD NO.:  [id number]           PATIENT TYPE:  LOCATION:                                 FACILITY:  PHYSICIAN:  [place] L. Malon Kindle., M.D.DATE OF BIRTH:  August 16, 1950  DATE OF ADMISSION:  10/15/2010 DATE OF DISCHARGE:                             HISTORY & PHYSICAL   PRIMARY CARE DOCTOR:  Dario Guardian, MD  CARDIOLOGIST:  Armanda Magic, MD  REASON FOR ADMISSION:  Sudden onset of right lower quadrant abdominal pain.  HISTORY:  The patient is a 60 year old woman who had been having some problems with dysphagia.  It was probably due to hiatal hernia and motility problem.  On April 11 four days ago, she underwent EGD which was basically normal other than a small hiatal hernia and underwent screening colonoscopy at that time as well by myself.  The colonoscopy was easy, uneventful, and the only finding was a 1 cm sessile polyp in the rectum that was easily removed and recovered.  There were no immediate complications from the colonoscopy and the pathology on the polyp was still pending.  From the time of the procedures until yesterday evening after supper, she felt fine, was having good bowel movements, passing flatus, and eating well.  She was active in her usual state of health.  She was out walking with her husband last night after supper and began to have right lower quadrant abdominal pain.  This became progressively worse.  She called me yesterday evening with complaints of worsening right lower quadrant pain and I suggested that she come to the emergency room for evaluation.  She has been in the emergency room since and her evaluation there has included a urinalysis that was negative, a normal white count of 10 and she was afebrile.  Two ER physicians have examined her and felt that she had severe localized right lower quadrant pain and a CT scan of the abdomen and pelvis has been obtained.  This revealed  some increase lymphadenopathy in the abdomen which she has had before, but no significant abnormalities of the abdomen.  The appendix was seen and appeared normal.  The colon and rectal area appeared normal.  There were was some increase in retroperitoneal adenopathy of unclear significance.  There was no clear inflammation around the rectum.  The pain has continued to be isolated to the right lower quadrant and despite some IV morphine, the patient is still having pain.  It is felt that she likely needs to be admitted due to her continuing pain and uncertainty of the diagnosis.  MEDICATIONS ON ADMISSION: 1. Metformin 1000 mg daily. 2. Cyclobenzaprine 10 mg p.r.n. for neck spasm. 3. Metoprolol 25 mg b.i.d. 4. Hydrochlorothiazide 25 mg daily. 5. Simvastatin 40 mg at bedtime. 6. Omeprazole 20 mg daily. 7. Ultram and Tylenol p.r.n.  ALLERGIES:  The patient has no drug allergies.  MEDICAL HISTORY:  History of restless leg syndrome, chronic neck pain and back pain.  She has coronary disease and several stents placed and had CABG in 2008.  She is currently followed by Dr. Gloris Manchester  Turner.  She has a history of depression, type 2 diabetes, hypertension, hyperlipidemia.  She also has a history of Graves' disease.  PAST SURGERIES:  CABG 2008, tubal ligation, and cholecystectomy.  FAMILY HISTORY:  No colon cancer or GI cancer in the family.  There is diabetes, hypertension, heart disease, and COPD in family.  SOCIAL HISTORY:  She is a former smoker, quit about 20 years ago.  She is married, does not drink.  REVIEW OF SYSTEMS:  Primarily as noted above.  PHYSICAL EXAMINATION:  VITAL SIGNS:  Temperature 97.3, blood pressure 136/67, pulse 80. GENERAL:  A pleasant obese white female in no distress. EYES:  Sclerae nonicteric. NECK:  Supple. THROAT:  Reveals somewhat dry mucous membranes. HEART:  Regular rate and rhythm murmurs or gallops. LUNGS:  Clear. ABDOMEN:  Obese and soft with  excellent bowel sounds with localized tenderness in the right lower quadrant, possibly with some rebound.  ASSESSMENT:  Right lower quadrant abdominal pain - this is sudden onset and the CT does not show appendicitis but the exam is certainly suspicious for this.  She had no symptoms at all for the previous 3-4 days after the colonoscopy until this came on suddenly.  There is no clear indication on the CT that this is related to the colonoscopy, although with removal of a rectal polyp, we have to assume there could be a burn through and some inflammation.  I feel that she should be admitted, given IV antibiotics, and pain medications and observe and probably have a surgeon evaluate her.          ______________________________ Llana Aliment. Malon Kindle., M.D.     Waldron Session  D:  10/15/2010  T:  10/15/2010  Job:  [id number]  cc:   Dario Guardian, M.D. Armanda Magic, M.D.  Electronically Signed by Carman Ching M.D. on 10/30/2010 08:37:12 AM

## 2010-11-14 NOTE — Discharge Summary (Signed)
[name], [name]                 ACCOUNT NO.:  [id number]   MEDICAL RECORD NO.:  [id number]          PATIENT TYPE:  INP   LOCATION:  6527                         FACILITY:  [facility]   PHYSICIAN:  Francisca December, M.D.  DATE OF BIRTH:  1951-04-09   DATE OF ADMISSION:  02/18/2007  DATE OF DISCHARGE:  02/20/2007                               DISCHARGE SUMMARY   DISCHARGE DIAGNOSES:  1. Noncardiac chest and back pain, probable musculoskeletal back pain.  2. known coronary artery disease, history of bypass surgery.  Status      post cardiac catheterization on 02/19/2007 under the care of Dr.      Armanda Magic.  3. Hyperlipidemia, treated.  4. Peripheral vascular disease.  5. Hypertension.  6. Diabetes mellitus.  7. Gastroesophageal reflux disease.  8. Depression.  9. History of retroperitoneal lymphadenopathy.  10.Long-term medication use.   HISTORY OF PRESENT ILLNESS:  [name] is a 60 year old female with a  known history of coronary artery disease.  In January of 2008, she  underwent bypass surgery with vein graft column leading to the LAD and  saphenous vein graft to the diagonal.  Recurrent chest pain led to  recatheterization in June of 2008; and she was found to have an occluded  saphenous vein graft to the diagonal.  She had a subtotal ostial  diagonal lesion and underwent drug-eluting stent placement to this area.   On February 18, 2007, she awoke with substernal chest pain, diaphoresis,  distal low scapular pain.  Her chest pressure was similar to, but not as  strong as, previous pain.  She was admitted to [facility] for  observation.  Lab work indicated her cardiac enzymes were negative, her  D-dimer was normal.  White count 10.7, hemoglobin 12.1, hematocrit 35.4,  platelets 275,000.  Sodium 137, potassium 4.3, BUN 35, creatinine 1.45,  magnesium 1.5, amylase 101, lipase 44.  EKG shows normal sinus rhythm  with T wave inversion in V1 and V2.  This has not changed  from previous  EKGs.   HOSPITAL COURSE:  During her hospitalization, she did undergo a real-  look cardiac catheterization which showed that at this time this vein  graft was occluded, as previously known.  The LIMA to the LAD was  patent.  She demonstrated one-vessel obstructive coronary artery disease  with a mid LAD lesion at 90%.  In general, it was thought that her  catheterization was no different from previous catheterizations, and  therefore the D-dimer was checked and this was normal.  The patient had  an amylase and lipase checked just to ensure this was not GI in nature  and these were normal.   On February 20, 2007, the patient was felt to be ready for discharge to  home.  Her pain was felt to be musculoskeletal in nature and therefore  her medications were changed to assist in this pain.   MEDICATIONS ON DISCHARGE:  1. Naprosyn 500 mg one p.o. twice per day for at least a week.  I have      given her more if  she needs it if her pain persists.  She is to eat      food with the Naprosyn.  In addition she is to take her Nexium with      the Naprosyn.  2. Nexium 40 mg one p.o. twice per day.  3. Enteric coated aspirin 325 mg per day.  4. Plavix 75 mg per day.  5. Metoprolol ER 25 mg daily.  6. Lipitor 80 mg per day.  7. Zetia 10 mg per day.  8. Lasix and potassium; she takes these daily as needed.  9. Folic acid daily.  10.Lexapro 10 mg per day.  11.Requip 1 mg at bedtime.  12.Glipizide 5 mg twice per day.  13.Imdur 60 mg per day.  14.She is to stop taking Ranexa.  15.She does not need to restart her Metformin until February 22, 2007.   DISCHARGE ACTIVITIES:  Increase activities slowly.  No lifting over 10  pounds for one week, no driving for two days.  Keep her March 06, 2007 office visit appointment.      Guy Franco, P.A.      Francisca December, M.D.  Electronically Signed    LB/MEDQ  D:  02/20/2007  T:  02/20/2007  Job:  [id number]   cc:   Francisca December,  M.D.

## 2010-11-14 NOTE — H&P (Signed)
[name], [name]                 ACCOUNT NO.:  [id number]   MEDICAL RECORD NO.:  [id number]          PATIENT TYPE:  INP   LOCATION:  1826                         FACILITY:  [facility]   PHYSICIAN:  Francisca December, M.D.  DATE OF BIRTH:  April 18, 1951   DATE OF ADMISSION:  05/14/2007  DATE OF DISCHARGE:                              HISTORY & PHYSICAL   CHIEF COMPLAINT:  Chest pain.   HISTORY OF PRESENT ILLNESS:  [name] is a pleasant 60 year old  patient with known coronary artery disease history, who has had over the  last week several episodes of chest pain consistent with her usual  angina.  States it is a chest heaviness that radiates to the back  between the scapula.  It is also associated with left arm numbness.  Pain can occur at rest or with ambulation and lasts approximately 30  minutes to an hour.  Nitroglycerin sublingual does help it, but it does  not totally relieve the discomfort.  Gradually, it will go away on its  own.  She had diaphoresis as well as shortness of breath with the pain.  This morning, she developed the same discomfort with ambulation.  She  had nausea with it as well as sweats.  She took one nitroglycerin, but  it did not completely relieve the pain.  The pain had increased in  severity when she presented to the office, a 10 on the scale of 1 to 10.  After two nitroglycerin given five minutes apart, the pain was decreased  to 2.  She is being admitted for unstable angina.   REVIEW OF SYSTEMS:  As above.  No recent illness.  CHEST:  As above.  Denies any pleuritic nature to discomfort.  No cough, wheezing, PND,  orthopnea.  ABDOMEN:  Denies pain.  Has had nausea with her symptoms and  on one occasion vomiting.  Denies any hematemesis, melena or  hematochezia.  GU:  Denies dysuria, urinary frequency, hematuria.  MUSCULOSKELETAL:  Denies joint problems.  Has had some left lower arm  numbness and tingling with above pain.  No edema.   PAST MEDICAL  HISTORY:  1. CABG x2, July 26, 2006, LAD and diagonal.  2. Recurrent atypical angina, unstable, status post PCI/DE stent to      the LAD and diagonal, December 24, 2006.  3. Diastolic CHF prior to the recurrent angina.  4. Hospitalization for chest and back pain, February 18, 2007, that      revealed patent LAD and diagonal stent.  5. ASPVD, decreased ABIs, 0.56 on right, 0.85 on the left, question      atypical claudication.  6. Hypertension.  7. Dyslipidemia.  8. Diabetes mellitus.  9. GERD.  10.Depression.   PAST SURGICAL HISTORY:  CABG x2 as above.   CURRENT MEDICATIONS:  1. Metformin 1000 mg b.i.d.  2. Lexapro 10 mg a day.  3. Lipitor 80 mg daily.  4. Nexium 40 mg twice a day.  5. Aspirin 325 mg a day.  6. Folic acid 1 mg daily.  7. Metoprolol ER 25 mg daily.  8.  Plavix 75 mg a day.  9. Requip 1 mg at bedtime.  10.Furosemide 40 mg b.i.d.  11.KCL 20 mEq daily.  12.Isosorbide 60 mg daily.  13.Nitroglycerin 0.4 mg sublingual p.r.n.  14.Lisinopril 5 mg a day.  15.Glipizide 5 mg b.i.d.  16.Naprosyn 500 mg b.i.d. p.r.n.  17.Ranexa 1000 mg b.i.d. (Dose increased on May 13, 2007).   DRUG ALLERGIES:  None.   FAMILY HISTORY:  Father died at age 11 from lung cancer; mother died at  84 of cancer, unknown type, had heart disease and hypertension as well  as diabetes.  She has two siblings, both without heart disease.   OBJECTIVE DATA:  VITAL SIGNS:  Weight 189, height 4 feet 11 inches,  pulse 72, blood pressure 118/70.  GENERAL:  Slightly pale, in minimal distress.  SKIN:  Warm and dry.  NECK:  No JVD.  No carotid bruits are auscultated.  Carotid upstrokes +2  and normal.  CHEST:  Normal S1, S2 without murmur, S3, S4, rub, click or gallop.  LUNGS:  Clear throughout.  ABDOMEN:  Soft and nondistended.  Bowel sounds present in all four  quadrants.  No abdominal tenderness or masses.  No hepatosplenomegaly.  GU:  Deferred.  EXTREMITIES:  +1 dorsalis pedal pulses.  There is  no lower extremity  edema.  NEURO:  Cranial nerves II-XII intact.  Gait steady.  Speech appropriate.   EKG:  Sinus rhythm, nonspecific ST-T wave abnormality, unchanged from  prior.   IMPRESSION:  1. Stable angina.  2. Significant coronary artery disease history, status post coronary      artery bypass grafting x2 as above.  3. Recent cardiac catheterization, February 18, 2007, revealing patent      left anterior descending and diagonal stent placement.  4. Hypertension.  5. Dyslipidemia.  6. Diabetes mellitus.   PLAN:  1. Admit to monitored bed.  2. IV nitroglycerin for pain control.  3. Start Lovenox.  4. Continue aspirin and Plavix.  5. Hold Glucophage.  6. NPO after midnight for cath tomorrow afternoon.      Tamera C. Lianne Cure      Francisca December, M.D.  Electronically Signed    TCL/MEDQ  D:  05/14/2007  T:  05/14/2007  Job:  [id number]

## 2010-11-14 NOTE — Cardiovascular Report (Signed)
[name]                 ACCOUNT NO.:  [id number]   MEDICAL RECORD NO.:  [id number]          PATIENT TYPE:  INP   LOCATION:  2918                         FACILITY:  [facility]   PHYSICIAN:  Francisca December, M.D.  DATE OF BIRTH:  1951/05/12   DATE OF PROCEDURE:  12/03/2008  DATE OF DISCHARGE:                            CARDIAC CATHETERIZATION   PROCEDURES PERFORMED:  1. Left heart catheterization.  2. Left ventriculogram.  3. Coronary angiography.  4. Left internal mammary artery angiography.   INDICATIONS:  [name] is a 60 year old woman with a longstanding  history of ASCVD status post PCI and CABG.  She has returned to the  hospital 2 days prior to this procedure with persistent rest angina  including the anterior chest, left arm, and mid scapular region.  She  has not responded to IV nitroglycerin and heparin.  CK-MB and troponin  enzymes have been negative.  There have been T-wave changes in the  lateral precordial leads on her ECG.  She is brought to the  catheterization laboratory at this time to identify possible progressive  disease and provide further therapeutic options.  She does have a  history of proven spasm within the LIMA graft on  previous procedure  performed on February 03, 2008.   PROCEDURE NOTE:  The patient was brought to the cardiac catheterization  laboratory in the fasting state.  The right groin was prepped and draped  in the usual sterile fashion.  Local anesthesia was obtained with  infiltration of 1% lidocaine.  A 6-French catheter sheath was inserted  percutaneously into the right femoral artery utilizing an anterior  approach over guiding J-wire.  A left heart catheterization and coronary  angiography then proceeded in a standard fashion using 6-French #4 right  Judkins catheters and a 6-French #3.5 left Judkins catheter.  A cine  left ventriculogram was performed utilizing a power injector and a 110-  cm pigtail catheter, 36 mL were injected  at 12 mL per second.  The right  coronary artery catheter was also used to cannulate the LIMA graft and  cineangiography performed in LAO, RAO, and left lateral projections.  At  the completion of the procedure, the catheter and catheter sheaths were  removed.  Hemostasis was achieved by direct pressure.  The patient was  transported to the recovery area in stable condition with an intact  distal pulse.   HEMODYNAMIC RESULTS:  Systemic arterial pressure was 146/80 with mean of  106 mmHg.  There was no systolic gradient cross the aortic valve.  The  left ventricular end-diastolic pressure was 12 mmHg.   Angiography:  The left ventriculogram demonstrated normal chamber size  and lower limit of normal left ventricular systolic function.  A  calculated ejection fraction utilizing a single-plane cine method was  51%.  There was mild-to-moderate apical and anterior hypokinesis seen.  There was left coronary artery calcification.  There was no mitral  vegetation.  The aortic valve was trileaflet and opens normally during  systole.   There was a right-dominant coronary artery system present.  The main  left  coronary artery is normal.   The left anterior descending artery and its branches are highly  diseased; the vessel contains a 70% narrowing in the midportion at the  origin of a large diagonal branch.  The diagonal branch itself, which  has previously been stented into the LAD is widely patent.  The LAD  itself has also been stented in the same region and there was a tubular  70% narrowing just after the origin of the diagonal branch.  The ongoing  LAD reaches, but barely traverses the apex and there is some competitive  flow seen.   The left circumflex coronary artery is small and normal giving rise to a  single small-to-moderate size marginal branch.   The right coronary artery is large and dominant and demonstrates luminal  irregularities within the mid section.  The distal portion  bifurcates  into a large posterior descending artery and a very large posterolateral  segment with a single large left ventricular branch.   The saphenous vein graft to the diagonal branch is known to be occluded  and was not injected.   The LIMA graft to the LAD is widely patent and demonstrates competitive  filling in the distal portion of the LAD.  The LIMA itself is tortuous,  but no focal or diffuse spasm is seen.  However, when cannulated with  the 6-French right coronary, the flow was completely obstructed.  A 0.2  mg of intracoronary nitroglycerin was administered and cine angiography  performed in LAO and left lateral projections.  Left lateral projection  demonstrated a nicely patent anastomotic site.   FINAL IMPRESSION:  1. Atherosclerotic cardiovascular disease, single vessel.  2. Intact left ventricular global systolic function with regional wall      motion abnormalities noted, ejection fraction 51%.  3. Tubular severe narrowing in the midportion of the left anterior      descending within a previously stented segment.  4. No clear culprit lesion for the patient's persistent angina is      identified.   PLAN:  Continue medical therapy, topical and intravenous nitroglycerin.  Discontinue heparin.  Ambulate the patient in the near future.      Francisca December, M.D.  Electronically Signed     JHE/MEDQ  D:  12/03/2008  T:  12/04/2008  Job:  [id number]

## 2010-11-14 NOTE — Discharge Summary (Signed)
NAME:  [name], [name]                     ACCOUNT NO.:  r.   MEDICAL RECORD NO.:  [id number]          PATIENT TYPE:  OIB   LOCATION:  6524                         FACILITY:  [facility]   PHYSICIAN:  Francisca December, M.D.  DATE OF BIRTH:  [birth year]   DATE OF ADMISSION:  12/24/2006  DATE OF DISCHARGE:  12/25/2006                               DISCHARGE SUMMARY   DISCHARGE DIAGNOSES:  1. Coronary artery disease status post drug eluting stent placement to      the ostial diagonal lesion on December 06, 2006 by Dr. Corliss Marcus.  2. Known coronary artery disease with history of drug eluting stent      placement to the mid left anterior descending artery in December      2007, percutaneous transluminal coronary angioplasty to the first      diagonal January 2008, history of bypass surgery on July 26, 2006 with graft to the left anterior descending and diagonal      arteries.  3. Peripheral vascular disease.  4. Hypertension.  5. Dyslipidemia.  6. Diabetes mellitus.  7. Reflux disease.  8. Depression.  9. Abdominal and retroperitoneal adenopathy with borderline      splenomegaly suspicious for lymphoma January 2008.  10.Long term medication use.   [name] has a significant cardiac history as mentioned above.  She was  seen in the office on December 10, 2006 after having some diaphoresis and  shortness of breath.  She was felt to be in volume overload and her  diuretics were increased.  She was seen a week later and due to  developing chest discomfort, Cardiolite was performed.  The Cardiolite  images showed anterolateral wall ischemia.  The patient was then brought  in for cardiac catheterization.   The patient underwent cardiac catheterization with graft visualization  and found to have the following:  Normal global LV function,  anterolateral hypokinesis, left main had a distal 30% stenosis, the LAD  had a 95% mid stenosis at the previous stent site. There was a 99%  stenosis at the  ostial diagonal.  Cervical vessels were small with no  disease as was the RCA.  The saphenous vein graft to the diagonal was  occluded at the origin.  The lumen to the LAD was widely patent.   Dr. Amil Amen then performed a percutaneous intervention utilizing a drug  eluting stent to the ostial subtotaled diagonal reducing the lesion to a  0% post procedure.  The patient tolerated the procedure well.   She remained in the hospital overnight.  Lab studies showed BUN of 18,  creatinine of 1.05.  Hemoglobin 12, hematocrit 35.3.  Her vital signs  were stable and she was in good physical condition.  She was discharged  to home in stable and improved condition.   DISCHARGE MEDICATIONS:  1. Nitroglycerin sublingual p.r.n. chest pain  2. Enteric-coated aspirin 325 mg a day.  3. Plavix 75 mg a day.  4. Lipitor 40 mg a day.  5. Lasix 40 mg p.o. b.i.d.  6. Folic  acid 1 mg per day.  7. Potassium 20 mEq daily.  8. Lisinopril 5 mg a day.  9. Requip q.h.s. as before admission.  10.Lexapro 20 mg a day.  11.Zetia 10 mg a day.  12.Metoprolol ER 25 mg a day.  13.Nexium daily as part of admission.   She is to resume her Metformin on December 28, 2006.   Clean cath site gently with soap and water.  No scrubbing.  She remains  on a diabetic heart healthy diet.  Increase activity slowly.  Participate in cardiac rehabilitation as prior to admission.  No lifting  over 10 pounds for 1 week.  No driving for 2 days.   She is to followup with Dr. Clydene Laming on January 17, 2007 at 9:00 a.m.  At  this point we need to make sure she has had a recent statin panel.  I am  more than certain she has had one since she has had good followup in the  office; although at this hospitalization we did not do a statin panel.      Guy Franco, P.A.      Francisca December, M.D.  Electronically Signed    LB/MEDQ  D:  12/25/2006  T:  12/25/2006  Job:  [id number]

## 2010-11-14 NOTE — Cardiovascular Report (Signed)
[name], [name]                 ACCOUNT NO.:  [id number]   MEDICAL RECORD NO.:  [id number]          PATIENT TYPE:  INP   LOCATION:  3733                         FACILITY:  [facility]   PHYSICIAN:  Francisca December, M.D.  DATE OF BIRTH:  [birth year]   DATE OF PROCEDURE:  05/15/2007  DATE OF DISCHARGE:                            CARDIAC CATHETERIZATION   PROCEDURES PERFORMED:  1. Left heart catheterization.  2. Left ventriculogram.  3. Coronary angiography.  4. Saphenous vein graft angiography.  5. Arterial conduit angiography.   INDICATIONS:  [name] is a 60 year old woman with significant single  vessel CAD.  She underwent coronary bypass including an LIMA to the LAD  and a saphenous vein graft to diagonal in January 2008 after attempted  PTCA with DE stent implantation in the LAD, was relatively unsuccessful  and early restenosis occurred due to inability to completely expand the  stent at the LAD obstruction.  Bypass surgery was performed as mentioned  above.  However, within 2-3 months, she had a recurrence of chest pain  and repeat cardiac catheterization showed occlusion of diagonal  saphenous vein graft.  She underwent a DE stent implantation of the LAD  diagonal through the previously placed LAD stent struts and had  significant relief of her chest discomfort.  However, she was  hospitalized in August 2008 with recurrent chest pain and a cardiac  catheterization at that time revealed patent stent and LIMA to the LAD.  Her medical therapy was increased and she was discharged.  She was  admitted yesterday with a five day recurrence of her usual atypical  angina.  Cardiac markers were negative.  She is brought to  catheterization laboratory at this time to identify possible progressive  disease or restenosis in the LAD diagonal stent.   PROCEDURE NOTE:  The patient was brought to cardiac catheterization  laboratory in the fasting state.  The right groin was prepped and  draped  in the usual sterile fashion.  Local anesthesia was obtained with  infiltration of 1% lidocaine.  A 6-French catheter sheath was inserted  percutaneously into the right femoral artery utilizing an anterior  approach over guiding J-wire.  Left heart catheterization and coronary  angiography then proceeded in standard fashion using 6-French 3.5 left  Judkins catheters, a 6-French #4 right Judkins catheter, and a 110-cm  pigtail catheter.  A 30 degrees RAO cine left ventriculogram was  performed utilizing a power injector, 36 mL were injected at 12 mL per  second.  The right coronary catheter was used to inject the saphenous  vein graft to the diagonal which remained occluded.  It was also used to  manipulated into the subclavian artery where the LIMA graft was  cannulated and cineangiography performed in LAO left lateral and RAO  projections.  At the completion of the procedure, a right femoral  arteriogram in the 45 degrees RAO angulation via the catheter sheath by  hand injection documented adequate anatomy for placement of percutaneous  closure device Angio-Seal.  This was subsequently deployed with good  hemostasis and intact distal pulse.  HEMODYNAMIC RESULTS:  Systemic arterial pressure was 124/73 with a mean  of 95 mmHg.  There was no systolic gradient across the aortic valve.  Left ventricular end-diastolic pressure was 12 mmHg pre-ventriculogram.   ANGIOGRAPHY:  The left ventriculogram demonstrated normal chamber size  and mildly depressed left ventricular systolic function.  There is  anterolateral severe hypokinesis.  A visual estimated ejection fraction  is 35-50%.  There is 2+ mitral regurgitation present.  The aortic valve  is trileaflet and opens normally during systole.   There is a right-dominant coronary system present.  The main left  coronary artery was normal.   The left anterior descending artery, its branches are highly diseased;  the vessel contains a  90% narrowing at the origin of a moderate-sized  diagonal branch.  There is a flow into the distal LAD and then  retrograde into the LIMA graft.  Competitive filling is not seen.  The  stented segment from the LAD diagonal appears to be widely patent.   The ongoing LAD taper significantly indicative of diffuse disease  consistent with her diabetic state.  It does reach and barely traverse  the apex.   The left circumflex coronary is relatively small.  It gives rise to a  tiny first marginal branch and then a small to moderate size second  marginal branch which is bifurcating.  This branch does not go below the  midportion of the left lateral LV.   The right coronary artery is large and dominant.  Other luminal  irregularities seen but no significant obstruction.  The distal vessel  gives rise to a large posterior descending artery, two small left  ventricular branches off of a large posterolateral segment, and then one  large left ventricular branch which perfuses the inferolateral wall of  the left ventricle.  No obstructions are seen in the distal vessels.   The saphenous vein graft to the diagonal remains occluded.   The LIMA graft to the LAD appears to be widely patent and quite tortuous  it inserts upon the mid to distal LAD.  It Provides antegrade and  retrograde filling.  Retrograde filling is competitive.  Intracoronary  nitroglycerin infused via the LIMA graft did not significantly dilate  the distal LAD.  There is perhaps a 30% stenosis at the anastomosis at  the LIMA to the LAD seen best in the left lateral projection.   FINAL IMPRESSION:  1. Atherosclerotic cardiovascular disease, severe single-vessel  2. Mildly diminished left ventricular systolic function with regional      wall motion abnormalities noted.  3. Mild to moderate mitral vegetation.  4. Angiographically patent LAD diagonal stent.  5. Severe diffuse disease of the distal LAD.  6. Patent LIMA graft to the  LAD.   PLAN/RECOMMENDATIONS:  I will review the old cine angiograms from August  and determine whether this decrease in LV function and regional wall  motion abnormalities is new.  That would certainly suggest there is  ongoing ischemia in the diagonal branch distribution despite the  appearance of the coronary angiograms.    Other etiologies for her chest pain and back pain will be sought and  perhaps a nuclear perfusion study obtained to confirm adequate entire  left ventricular profusion      Francisca December, M.D.  Electronically Signed     JHE/MEDQ  D:  05/15/2007  T:  05/16/2007  Job:  [id number]

## 2010-11-14 NOTE — Discharge Summary (Signed)
[name], [name]                 ACCOUNT NO.:  [id number]   MEDICAL RECORD NO.:  [id number]          PATIENT TYPE:  INP   LOCATION:  3733                         FACILITY:  [facility]   PHYSICIAN:  Lyn Records, M.D.   DATE OF BIRTH:  [birth year]   DATE OF ADMISSION:  05/14/2007  DATE OF DISCHARGE:  05/17/2007                               DISCHARGE SUMMARY   DISCHARGE DIAGNOSES:  1. Chest pain, supposedly noncardiac in nature.  2. Known coronary artery disease.  3. Diabetes mellitus.  4. Hyperlipidemia.  5. Hypokalemia, repleted.  6. Hypomagnesemia, repleted.   HOSPITAL COURSE:  [name] is a 60 year old female with a known  history of coronary artery disease, who was seen in the office on  May 14, 2007, with chest pain.  Because of her chest pain and known  coronary artery disease, she was admitted to [facility].  She has a  history of previous bypass surgery with known graft failure.   During her hospitalization, the labs today showed normal cardiac  isoenzymes.  She also had normal LFTs.  Her magnesium did drop to 1.2.  This was repleted.  Sodium 141, potassium 4.4 (after repletion), BUN 18,  creatinine 1.15, D-dimer 0.37, white count 7.2, hemoglobin 10.7,  hematocrit 31.4 and platelets 251.   Her studies included a cardiac catheterization, which showed the  following:  Anterolateral hypokinesis, EF of 45 to 50% with a 2+ MR, the  left main two grafts clinically normal with LAD 90% mid diagonal with a  diagonal stent patent, the circumflex small but with no obvious disease,  the right coronary artery is dominant with no disease, the saphenous  vein graft was occluded, the lesion to the LAD was patent, there was a  30% stenosis at the anastomosis with no clear angiographic culprit  lesions.  Most recently went ahead and pursued a Cardiolite, which  showed no ischemia.   She had some pain into her back and we then performed an MRI that showed  multiple thoracic  disk protrusions, but no significant cord compression  or spinal stenosis.   By May 17, 2007, the patient was felt to be ready for discharge to  home.  She will follow up with Dr. Logan Bores in two weeks, remain on a low  sodium, heart healthy, diabetic diet, activity as tolerated, Darvocet N-  100 one to two tablets q.6h p.r.n. pain, sublingual nitroglycerin p.r.n.  chest pain, her other medications are unchanged from her admission.   ADMISSION MEDICATIONS:  1. Metformin 1000 mg b.i.d.  2. Lexapro 10 mg daily.  3. Lipitor 80 mg a day.  4. Nexium 40 mg twice a day.  5. Enteric-coated aspirin 325 mg a day.  6. Folic acid 1 mg daily.  7. Metoprolol-ER 25 mg a day.  8. Plavix 75 mg a day.  9. Requip 1 mg q.h.s.  10.Lasix 40 mg b.i.d.  11.Potassium 20 mEq a day.  12.Imdur 60 mg daily.  13.Lisinopril 5 mg a day.  14.Glucophage 5 mg twice a day.  15.Renexa 1000 mg twice a day.  Guy Franco, P.A.      Lyn Records, M.D.  Electronically Signed    LB/MEDQ  D:  06/12/2007  T:  06/12/2007  Job:  [id number]   cc:   Francisca December, M.D.

## 2010-11-14 NOTE — Cardiovascular Report (Signed)
NAME:  [name], [name]                 ACCOUNT NO.:  [id number]   MEDICAL RECORD NO.:  [id number]          PATIENT TYPE:  OIB   LOCATION:  6524                         FACILITY:  [facility]   PHYSICIAN:  Francisca December, M.D.  DATE OF BIRTH:  13-Jan-1951   DATE OF PROCEDURE:  12/24/2006  DATE OF DISCHARGE:                            CARDIAC CATHETERIZATION   PROCEDURES PERFORMED:  1. Left heart catheterization.  2. Left ventriculogram.  3. Coronary angiography.  4. Saphenous vein graft angiography.  5. Arterial conduit angiography.  6. Percutaneous coronary intervention/drug-eluting stent implantation,      first diagonal branch.   INDICATIONS:  [name] is a pleasant, but unfortunate 60 year old  woman who has had 2 previous PCIs in the anterior descending artery  which were unsuccessful due to early restenosis.  Six months ago she  underwent coronary bypass surgery with an LIMA to the LAD and a  saphenous vein graft to the diagonal.  Recently, she has developed  symptoms and signs of heart failure which have been successfully treated  with diuretics and ACE inhibitors.  She has more recently developed an  atypical anginal syndrome with intrascapular pain; because of this and  her recent episode of CHF, I have performed exercise myocardial  perfusion scintigraphy that resulted in the findings of exercise-induced  intrascapular back pain as well as ischemic electrographic changes and a  moderate-sized reversible anterolateral defect.  She is therefore  brought to the catheterization laboratory at this time to identify the  extent of recurrent disease or progressive disease and provide for  further therapeutic options.   PROCEDURAL NOTE:  The patient was brought to cardiac catheterization  laboratory in the fasting state.  The right groin was prepped and draped  in the usual sterile fashion.  Local anesthesia was obtained with the  infiltration of 1% lidocaine.  A 6-French catheter  sheath was inserted  percutaneously into the right femoral artery utilizing an anterior  approach over guiding J-wire.  A 110-cm pigtail catheter was used to  measure pressures in the ascending aorta and in the left ventricle, both  prior to and following the ventriculogram.  A 30-degree RAO cine left  ventriculogram was performed utilizing power injector.  Twenty-five  milliliters were injected at 12 mL per second.  Coronary angiography was  then performed.  Attempts to cannulate the left coronary os with a left  Judkins #4 and a left Judkins #3.5 were unsuccessful.  It was finally  cannulated with a 3.0 CLS guiding catheter.  Cineangiography was  performed in LAO and RAO projections.  This catheter was exchanged for 6-  [place] #4 right Judkins catheters.  Cineangiography of the right  coronary was conducted in LAO and RAO projections.  The right coronary  catheter was then withdrawn into the ascending aorta and it was used to  perform a cineangiography of the stump of the saphenous vein graft to  the diagonal branch; this was completely occluded at its ostium.  The  right coronary catheter was then manipulated into the left subclavian  artery and successfully cannulated the LIMA graft  to the LAD.  Cineangiography was performed in AP and LAO projections.   I then proceeded with percutaneous coronary intervention.  The CLS  guiding catheter was returned to the circulation and again successfully  cannulated the left main.  The patient received 300 mg of p.o. Plavix as  well as a 0.75 mg/kg bolus of bivalirudin followed by a 1.25 mg/kg per  hour constant infusion.  The resultant ACT was 374 seconds.  A 0.014-  inch Whisper wire was advanced into the anterior descending artery and  across a subtotal lesion at the ostium of the diagonal branch without  difficulty.  Initial balloon dilatation was performed with a 2.0/12-mm  Monorail Maverick; this was inflated to 8 atmospheres for 97  seconds.  This device was deflated and removed and a 2.5/12-mm Medtronic Endeavor  drug-eluting stent was advanced into place, carefully positioned and  deployed a peak pressure of 16 atmospheres for 51 seconds.  It was  initially deployed at 9 atmospheres and the balloon was then deflated  and withdrawn slightly and inflated to 16 atmospheres.  The stent  balloon was deflated and removed and a 2.75/8-mm Quantum Maverick  intracoronary balloon was advanced into the more proximal portion of the  stent and inflated to 16 atmospheres for 45 seconds; it was then  deflated and advanced slightly into the more midportion of the stent and  inflated to 12 atmospheres for 22 seconds.   It should be noted that this stent was placed within the struts of the  previously placed LAD stents.   Following confirmation of adequate patency in orthogonal views, both  with and without the guidewire in place, the guiding catheter was  removed.  A right femoral arteriogram in the 45-degree RAO angulation  via the catheter sheath by hand injection documented adequate anatomy  for placement of percutaneous closure device, Angio-Seal.  This was  subsequently deployed with good hemostasis and an intact distal pulse.   HEMODYNAMIC RESULTS:  Systemic arterial pressure was 134/70 with a mean  of 97 mmHg.  There was no systolic gradient across the aortic valve.  The left ventricular end-diastolic pressure was 10 mmHg pre  ventriculogram.   ANGIOGRAPHY:  The left ventriculogram demonstrated normal chamber size  and normal global systolic function with a visually estimated ejection  fraction of 60%.  There was anterolateral and focal apical hypokinesis.  The previously placed stent in the LAD is easily visualized easily  visualized.  There is no mitral regurgitation.  The aortic valve is  trileaflet and opens normally during systole.  There is a right-dominant coronary system present.  The main left  coronary  artery demonstrated a proximal 20% narrowing.   The anterior descending artery is highly diseased; there is a subtotal  stenosis in the midportion within the previously placed stented segment.  As well, this stenosis extends into the proximal portion of the diagonal  branch, where it is 99% stenotic.   The left circumflex coronary artery is relatively small; it amounts to a  single small trifurcating marginal branch that does not leave the basal  to midportion of the lateral wall of the heart.   It should be noted there is a small to moderate first diagonal branch.  The subtotal lesion is in the second diagonal branch.   The right coronary artery is large and dominant.  There is a 20% to 30%  plaque on the lateral wall of the midportion of the right coronary;  otherwise, I can see  no significant obstructions.  The distal segment  gives rise to a moderate-sized posterior descending artery, then 3 small  posterolateral branches and then a very large fourth posterolateral  branch, which reaches out to the apex.  There are no obstructions in  these distal branches of the right coronary.   As mentioned, the saphenous vein graft to the right coronary is occluded  at its ostium.   The LIMA graft to the LAD is widely patent throughout its segment, quite  tortuous of course, and inserts upon the mid and distal portion of the  anterior descending artery.  It provides retrograde and antegrade  filling.  Retrograde filling reaches to the stented segment and there is  a trace amount of flow into the diagonal branch itself; is highly  diseased.  The antegrade filling reaches via a small vessel to the apex.  I can see no anastomotic obstruction.   Following balloon dilatation and stent implantation in the ostium of the  second diagonal branch, there was no residual stenosis.  There was a  slight lucency at the site where the stent was placed through the struts  of the previously-placed LAD  stent.  There is diminished antegrade flow  in the anterior descending artery, but this is still competitive and the  rest of the anterior descending artery is supplied by the LIMA graft.   FINAL IMPRESSION:  1. Atherosclerotic coronary vascular disease, single vessel.  2. Status post successful percutaneous coronary intervention/DE stent      implantation, ostium of the first diagonal branch.  3. Early closure of saphenous vein graft to the diagonal branch.  4. Widely patent left internal mammary artery graft to the left      anterior descending.  5. Intact global left ventricular systolic function, ejection fraction      60%.  6. Regional wall motion abnormality as noted.  7. Atypical angina (interscapular pain) was produced with device      insertion and balloon inflation.      Francisca December, M.D.  Electronically Signed     JHE/MEDQ  D:  12/24/2006  T:  12/25/2006  Job:  [id number]  cc:   Dario Guardian, M.D.

## 2010-11-14 NOTE — Cardiovascular Report (Signed)
[name], [name]                 ACCOUNT NO.:  [id number]   MEDICAL RECORD NO.:  [id number]          PATIENT TYPE:  INP   LOCATION:  6527                         FACILITY:  [facility]   PHYSICIAN:  Armanda Magic, M.D.     DATE OF BIRTH:  Apr 04, 1951   DATE OF PROCEDURE:  02/19/2007  DATE OF DISCHARGE:                            CARDIAC CATHETERIZATION   PROCEDURE:  1. Left heart catheterization.  2. Coronary angiography.  3. Left ventriculography.  4. Left internal mammary artery angiography.  5. Aortic root angiography.   OPERATORS:  1. Armanda Magic, M.D.  2. Jake Bathe, M.D.   COMPLICATIONS:  None.   INTRAVENOUS MEDICATIONS:  Versed 1 mg, fentanyl 25 mcg IV.   INDICATIONS:  Unstable angina and coronary disease.   INTRAVENOUS ACCESS:  Via right femoral artery 6-French sheath.   INDICATION:  This is a very pleasant 60 year old white female who is  status post coronary artery bypass grafting in January 2008 with a LIMA  to the LAD and saphenous vein graft to the second diagonal.  She had  recurrent chest pain which led to a repeat cath in June.  She was found  to have an occluded saphenous vein graft to the diagonal and a  subtotaled ostial diagonal lesion, status post DES stent to this area.  She awoke yesterday afternoon with substernal chest pain and with  profound diaphoresis, with also scapular pain and pressure in her chest  similar to what she has had in the past.  She now presents for repeat  cardiac catheterization.   DESCRIPTION OF PROCEDURE:  The patient was brought to cardiac  catheterization laboratory in a fasting nonsedated state.  Informed  consent was obtained.  The patient was connected to continuous heart  rate and pulse oximetry monitoring and intermittent blood pressure  might.  The right groin was prepped and draped in a sterile fashion.  Xylocaine 1% was used for local anesthesia.  Using a modified Seldinger  technique, a 6-French sheath was placed in  the right femoral artery.  Under fluoroscopic guidance, a 6-French JL-4 catheter was placed into  the area of the left coronary ostium, but could not adequately engage  the ostium.  The catheter was exchanged out over a guidewire for a 6-  [place] JL-3.5 catheter and was successfully engage to the coronary  ostium.  Multiple cine films were taken in the 30-degree RAO and 40-  degree LAO views.  The catheter was then exchanged out over a guidewire  for 6-French JR-4 catheter, which was placed under fluoroscopic guidance  into the aortic root and extended into the left internal mammary artery.  Multiple cine films were taken in the 30-degree RAO and 40-degree LAO  views.  This catheter was then removed from the ostium of the LIMA and  advanced into the ostium of the right coronary artery.  Multiple cine  films taken in the 30-degree RAO and 40-degree LAO views.  This catheter  was then advanced into the saphenous vein graft to the diagonal and 1  cine film was obtained which showed an occluded  vein graft at the  ostium.  The catheter was then removed over a guidewire and a 6-French  angled pigtail catheter was placed under fluoroscopic guidance in the  left ventricular cavity.  Left ventriculography was performed in the 30-  degree RAO view using a total of 25 mL of contrast at 12 mL per second.  The catheter was then pulled back across the aortic valve with no  significant gradient noted.  Catheter was then placed just above the  aortic valve and aortic root angiography was performed using a total of  20 mL of contrast at 20 mL per second.  The catheter was then removed  over a guidewire.  At the end of the procedure, all catheters and  sheaths were removed.  Manual compression was performed until adequate  hemostasis was obtained.  The patient was transferred back to her room  in stable condition.   RESULTS:  The left main coronary artery is widely patent and bifurcates  into the left  anterior descending artery and the left circumflex artery.   The left anterior descending artery is widely patent in its proximal  portion, giving rise to a first diagonal branch, which is widely patent.  The ongoing left anterior descending then gives rise to a second  diagonal branch and just distal to the takeoff of the second diagonal  branch, there is evidence of a prior stent with a 90% narrowing within  the stent.  There is evidence of competitive flow in the distal LAD from  a LIMA graft.  Both the diagonal #1 and the diagonal #2 are widely  patent.   The left circumflex is a small artery that gives rise to one obtuse  marginal branch which bifurcates into 2 daughter branches.  The  circumflex is widely patent   The left internal mammary artery is widely patent throughout its course  to the distal LAD.  There is evidence of competitive flow and there  appears to be no anastomotic lesion.   The right coronary artery is extremely large and dominant and gives rise  to a posterior descending artery, which is widely patent; it also gives  rise to a posterolateral artery with 3 small branches, all of which are  widely patent.   LEFT VENTRICULOGRAPHY:  Showed normal LV systolic function, EF 55%,  aortic pressure 109/58 mmHg, LV pressure 108/8 mmHg.  LVEDP was 14 mmHg.   AORTIC ROOT ANGIOGRAPHY:  Performed to rule out aortic dissection.  There was no evidence of aortic aneurysm or dissection in the ascending  or thoracic descending aorta.   ASSESSMENT:  1. One-vessel obstructive coronary disease.  2. Patent left internal mammary artery to the left anterior      descending.  3. Patent stent in the diagonal #2.  4. Occluded saphenous vein graft the diagonal #2.  5. Normal left ventricular function.  6. Normal aortic root and descending thoracic aorta.  7. Noncardiac chest pain.   PLAN:  Check D-dimer STAT to rule out PE.  Continue aspirin and Plavix.  Further workup per Dr.  Amil Amen.      Armanda Magic, M.D.  Electronically Signed     TT/MEDQ  D:  02/19/2007  T:  02/20/2007  Job:  [id number]   cc:   Dario Guardian, M.D.

## 2010-11-14 NOTE — H&P (Signed)
NAME:  Kelli, [name]                 ACCOUNT NO.:  [id number]   MEDICAL RECORD NO.:  [id number]          PATIENT TYPE:  OBV   LOCATION:  4736                         FACILITY:  [facility]   PHYSICIAN:  Rollene Rotunda, MD, FACCDATE OF BIRTH:  March 30, 1951   DATE OF ADMISSION:  01/31/2008  DATE OF DISCHARGE:                              HISTORY & PHYSICAL   PRIMARY CARE PHYSICIAN:  Dario Guardian, MD.   CARDIOLOGIST:  Francisca December, MD.   REASON FOR PRESENTATION:  Evaluate the patient with chest pain.   HISTORY OF PRESENT ILLNESS:  The patient is a 60 year old white female  well known to Dr. Amil Amen.  She has a history of coronary artery disease  as described below.  Last catheterization was in November 2008 for chest  and shoulder discomfort.  The results are described below.  At the time  of that admission, she was diagnosed with nonanginal chest pain.  She  did have an MRI demonstrating some disk protrusions.  She has had some  infrequent chest pain off and on since then.  She usually rests and  relaxes and the pain goes away.  She does not take nitroglycerin.  However, yesterday she had some left shoulder blade discomfort that was  similar to her previous angina and similar to the symptoms she had in  November.  She actually took one nitroglycerin and then ran out.  Today,  she has shoulder discomfort.  She also had some substernal chest  discomfort and nausea.  Because of this, she presented to the emergency  room.  She did not have any nitroglycerin.  This was all similar to her  complaints in November and previous angina.  She felt her hands  tingling.  She did not have any palpitations, presyncope, or syncope.  She was not particularly short of breath.  She said she was somewhat  diaphoretic.  All of her symptoms came on at rest.  They lasted about 15  minutes.  Her ER records demonstrate that she may have had some  discomfort when she presented though she has not really reported  this.   Prior to the last couple of days, she has been in her usual state which  is fairly limited.  She does not do much activity, though she keeps her  grandchildren.  She walks to the grocery store.  She has to walk slowly  because she does have some chronic shortness of breath.  She sleeps  chronically on three pillows.  She has had no significant change in  this.  She has had no change in her exercise tolerance.   PAST MEDICAL HISTORY:  Coronary artery disease (status post CABG and  stenting).  Last catheterization in November 2008 demonstrated normal  left main.  There was an LAD with a stent that had 90% stenosis but the  distal vessel filled via LIMA.  There was a diagonal stent to D2 which  was widely patent.  The circumflex was patent.  The right coronary  artery was patent.  The LIMA to the LAD was widely patent.  Saphenous  vein graft to second diagonal was occluded (normal left ventricle  function), diastolic congestive heart failure, peripheral vascular  disease (0.56 ABI on the right and 0.85 on the left), hypertension times  years, dyslipidemia times years, diabetes mellitus times 5-6 years,  gastroesophageal reflux disease, and depression.   PAST SURGICAL HISTORY:  CABG x2.   ALLERGIES:  None.   MEDICATIONS:  1. Glipizide 5 mg b.i.d.  2. Isosorbide 60 mg daily.  3. Lasix 40 mg b.i.d.  4. Lexapro 10 mg daily.  5. Lipitor 80 mg daily.  6. Lisinopril 5 mg daily.  7. Lopressor 25 mg daily.  8. Metformin 1000 mg b.i.d.  9. Nexium 40 mg daily.  10.Plavix 75 mg daily.  11.Potassium 20 mEq daily.  12.Aspirin 325 mg daily.  13.Folic acid.  14.Ranexa 1000 mg b.i.d.  15.ReQuip 1 mg at bedtime.   SOCIAL HISTORY:  The patient is married.  She has never really smoked  cigarettes.  She does not drink alcohol.   FAMILY HISTORY:  Noncontributory for early coronary artery disease.  Her  father died at 55 of lung cancer.  Her mother had cancer at 12.   REVIEW OF SYSTEMS:   As stated in the HPI and otherwise negative for all  other systems.   PHYSICAL EXAMINATION:  GENERAL:  The patient is in no acute distress.  She has a pleasant affect.  VITAL SIGNS:  Blood pressure 117/67 to 80/49, heart rate 82 and regular,  respiratory rate 16, and afebrile.  HEENT:  Eyelids are unremarkable.  Pupils equal, round, and reactive to  light.  Fundi not visualized.  Oral mucosa unremarkable.  NECK:  No jugular venous distention at 45 degrees.  Carotid upstroke  brisk and symmetrical.  No bruits.  No thyromegaly.  LYMPHATICS:  No  cervical, axillary, or inguinal adenopathy.  LUNGS:  Clear to auscultation bilaterally.  BACK:  No costovertebral angle tenderness.  CHEST:  Well-healed sternotomy scar.  HEART:  PMI not displaced or sustained.  S1 and S2 within normal limits.  No S3.  No S4.  No clicks.  No rubs.  No murmurs.  ABDOMEN:  Obese.  Positive bowel sounds.  Normal in frequency and pitch.  No bruits.  No  rebound.  No guarding.  No midline pulsatile mass.  No hepatomegaly.  No  splenomegaly.  SKIN:  No rashes.  No nodules.  EXTREMITIES:  Pulses 2+ throughout.  No edema.  No cyanosis.  No  clubbing.  NEURO:  Oriented to person, place, and time.  Cranial nerves II through  XII grossly intact.  Motor grossly intact.   EKG:  Sinus rhythm, rate 86, axis within normal limits, intervals within  normal limits, and no acute ST-T wave changes.   LABORATORY DATA:  Hemoglobin 11.9, sodium 36, potassium 4.6, BUN 36, and  creatinine 1.3.  Point-of-care markers negative x1.  Chest x-ray:  No  acute disease.   ASSESSMENT AND PLAN:  1. Chest discomfort.  The patient's chest discomfort is somewhat      atypical.  It is similar to her previous pain in November when she      was diagnosed with nonanginal chest pain.  There are no objective      markers of ischemia.  At this point, I think the pretest      probability of recurrent obstructive coronary artery disease as the       etiology is low.  I will put her in an observation status.  She  will have enzymes cycled.  She will continue on heparin which was      started by the ER.  She will continue on her other medications.      She will get an a.m. EKG.  I would suggest that if the EKG and      enzymes are not abnormal, no further testing would be necessary and      she could continue with aggressive secondary risk reduction.  2. Diabetes.  She will continue her oral regimen, and I will cover      sliding scale.  3. Diastolic heart failure.  She seems to be euvolemic, and we will      continue on the Lasix.  This is chronic diastolic heart failure.  4. Hypertension.  Blood pressure is controlled.  She will continue on      medications as listed.  5. Dyslipidemia.  She will continue on Lipitor and this can be      followed by Dr. Katrinka Blazing and Dr. Amil Amen.      Rollene Rotunda, MD, [facility]  Electronically Signed     JH/MEDQ  D:  01/31/2008  T:  02/01/2008  Job:  [id number]   cc:   Francisca December, M.D.

## 2010-11-17 NOTE — Op Note (Signed)
[name], [name]                 ACCOUNT NO.:  [id number]   MEDICAL RECORD NO.:  [id number]          PATIENT TYPE:  INP   LOCATION:  2003                         FACILITY:  [facility]   PHYSICIAN:  Sheliah Plane, MD    DATE OF BIRTH:  [birth year]   DATE OF PROCEDURE:  07/26/2006  DATE OF DISCHARGE:  07/30/2006                               OPERATIVE REPORT   PREOPERATIVE DIAGNOSIS:  Recurrent coronary occlusive disease, status  post early restenosis of stents.   POSTOPERATIVE DIAGNOSIS:  Recurrent coronary occlusive disease, status  post early restenosis of stents.   OPERATION/PROCEDURE:  Off-pump coronary artery bypass grafting x2 with  the left internal mammary artery to the left anterior descending  coronary artery and reverse saphenous vein graft to the diagonal  coronary artery using right thigh endo vein harvesting.   SURGEON:  Gwenith Daily. Tyrone Sage, M.D.   FIRST ASSISTANT:  Kerin Perna, M.D.   SECOND ASSISTANT:  Theda Belfast, PA-C.   BRIEF HISTORY:  The patient is a 60 year old female who presented in the  fall of 2007 with unstable chest pain.  She underwent cardiac  catheterization and subsequently over the past month has had four  catheterizations with drug coated stent placed in the LAD,  angioplasty  of the diagonal branch and now returns with recurrent stenosis at the  above sites.  The patient has been maintained on Plavix.  Films were  reviewed and coronary artery bypass grafting was recommended to the  patient.  Because of the drug-coated stents, she was maintained on  Integrilin.  She was maintained on heparin prior to proceeding with  surgery after washout of Plavix.   DESCRIPTION OF PROCEDURE:  The patient underwent general endotracheal  anesthesia without incident.  The skin of the chest and legs was prepped  with Betadine and draped in the usual sterile manner.  The right arm was  also prepped and draped in the sterile manner.  A median sternotomy  was  performed.  The left internal mammary artery was dissected down as  pedicle graft and distal artery was divided.  Had good free flow.  The  vessel was hydrostatically dilated with heparinized saline.  The  pericardium was then opened. After examination, the anatomic situation  appeared suitable for off-pump bypass.  Guidant off-pump bypass system  was used.  The patient was systemically heparinized.  Using the Guidant  endo vein harvesting system, vein was harvested from the right thigh and  was of good quality and caliber.  The apex of the heart was elevated.  With the stabilization device, the site of anastomosis in the diagonal  and LAD were visible.  The heart was returned to the chest.  Partial  occlusion clamp was placed on the ascending aorta.  A segment of reverse  saphenous vein graft was anastomosed to the ascending aorta doing the  proximal anastomosis first.  The partial occlusion graft was removed and  the graft was trimmed to appropriate length, anastomosed to the diagonal  vessel.  The stabilization retractors were then positioned and attention  was turned first  to the diagonal coronary artery.  Vessel loops were  placed proximally and distally to the site of anastomosis.  This allowed  inflow of the vessel. The vessel was opened and using a running 7-0  Prolene, a distal anastomosis with a segment of the reverse saphenous  vein graft was performed.  Prior to complete closure, the bulldog on the  mammary artery was removed to flush air from the vessel and the  anastomosis was completed.  The stabilization device was then moved to  accommodate the LAD between the mid and distal third.  The vessel loop  retractor tapes were used to control the vessel proximally and distally.  The LAD was opened.  Using a running 8-0 Prolene, left internal mammary  artery was anastomosed to the left anterior descending coronary artery.  Before complete closure, air was evacuated from the  vessels and a  bulldog removed off the mammary artery.  Sites of anastomosis were  inspected and free of bleeding.  The patient remained hemodynamically  stable throughout the procedure.  The heart was then returned to the  pericardial space.  Ventricular pacing wires only were placed.  Graft  markers were placed on the proximal vein graft.  Sites of anastomosis  were free of bleeding.  Pericardium was loosely approximated.  Sternum  was reapproximated with a #6 stainless steel wire.  Fascia closed with  interrupted 0 Vicryl, running 3-0 Vicryl in the subcutaneous tissue with  4-0 subcuticular stitch in skin edges.  A left chest tube and a  substernal Blake drain were in place.  The patient was then transferred  to the surgical intensive care unit for further postoperative care.      Sheliah Plane, MD  Electronically Signed     EG/MEDQ  D:  07/30/2006  T:  07/31/2006  Job:  [id number]   cc:   Francisca December, M.D.

## 2010-11-17 NOTE — Cardiovascular Report (Signed)
NAME:  [name]                 ACCOUNT NO.:  [id number]   MEDICAL RECORD NO.:  [id number]          PATIENT TYPE:  OIB   LOCATION:  6532                         FACILITY:  [facility]   PHYSICIAN:  Francisca December, M.D.  DATE OF BIRTH:  May 11, 1951   DATE OF PROCEDURE:  06/17/2006  DATE OF DISCHARGE:                            CARDIAC CATHETERIZATION   PERCUTANEOUS CORONARY INTERVENTION:   PROCEDURES PERFORMED:  1. PCI/bare metal stent implantation mid-LAD.  2. Balloon dilatation ostium first diagonal branch.   INDICATIONS:  [name] is a 60 year old woman with atypical class IV  angina.  She has undergone a myocardial perfusion study which was  unremarkable.  Because of multiple risk factors and persistence of her  symptoms, she underwent cardiac catheterization which revealed an 80%  focal stenosis in the midportion of the LAD.  This was just after the  origin of a large diagonal branch.  She is brought to the  catheterization laboratory at this time to provide for percutaneous  revascularization.   PROCEDURAL NOTE:  The patient was brought to the cardiac catheterization  laboratory in the fasting state.  The right groin was prepped and draped  in the usual sterile fashion.  Local anesthesia was obtained with the  infiltration of 1% lidocaine.  A 6-French catheter sheath was inserted  percutaneously into the right femoral artery utilizing an anterior  approach over a guiding J-wire.  The patient the received 0.75 mg/kg of  bivalirudin and then a constant infusion of 1.75 mg/kg per hour.  The  resultant ACT was 323 seconds.  A 6-French number 3.0 CLS guiding  catheter was advanced to the ascending aorta where the left coronary os  was engaged.  A 0.014 inch Luge intracoronary guidewire was passed  across the lesion in the mid LAD without difficulty.  The lesion was  primarily stented using a 3.5/16 Liberte bare metal intracoronary stent.  This was deployed at peak pressure of 14  atmospheres for 49 seconds.  Post dilatation was then performed with a 3.5/8 mm Quantum Maverick  intracoronary balloon.  This was inflated to 22 atmospheres for 53  seconds.  It was clear that the balloon and stent had not fully  expanded.  This balloon was removed, and the wire was withdrawn and  advanced into the diagonal branch which was experiencing plaque shift.  A second Luge wire was advanced down the LAD through the stent.  A  2.5/15 mm Scimed Maverick intracoronary balloon was then advanced to the  proximal portion of the diagonal branch and inflated to 6 atmospheres on  two occasions for not greater than 30 seconds.  This balloon was  deflated and an attempt to perform kissing balloons with a 3.75/8 mm  Guidant PowerSail balloon was unsuccessful.  I was unable to advance  both devices to the guiding catheter.  Therefore, the 2.5/15 mm Monorail  Maverick was removed and the PowerSail advanced into place within the  LAD stent.  It was inflated to a peak pressure of 18 atmospheres for  approximately 100 seconds.  This balloon also did not fully  inflate.  Finally, I performed kissing balloon dilatation with the 2.5/15 mm  Maverick in the diagonal branch and a 3.25/8 mm Quantum Maverick in the  diagonal branch.  The Quantum Maverick was inflated twice to 12  atmospheres for 41 seconds.  The diagonal Maverick balloon was inflated  twice to 6 atmospheres.  The balloons were deflated and removed, and  following confirmation of adequate patency in the orthogonal views, both  with and without the guidewires in place, the guiding catheter was  removed.  A right femoral arteriogram in the 45 degrees RAO angulation  via the catheter sheath by hand injection documented adequate anatomy  for placement of the percutaneous closure device AngioSeal.  This was  subsequent deployed with good hemostasis and an intact distal pulse.   ANGIOGRAPHY:  As mentioned, the lesion treated within the  midportion of  the anterior descending artery was about 5-6 mm in length and 80%  stenotic.  Following balloon dilatation and stent implantation, there  was a 10-20% residual stenosis within the midportion of the stented  segment.  The diagonal branch was also somewhat hazy in the proximal  segment but had good TIMI grade III flow.  There was no significant  obstruction in the diagonal branch prior to initiation of the  angioplasty.  There was significant plaque shift, however, following  placement of the stent.   FINAL IMPRESSION:  1. Atherosclerotic coronary vascular disease, two-vessel.  2. Status post successful bare metal stent implantation mid-left      anterior descending with typical angina reproduced.      Francisca December, M.D.  Electronically Signed     JHE/MEDQ  D:  06/17/2006  T:  06/18/2006  Job:  [id number]   cc:   Dario Guardian, M.D.

## 2010-11-17 NOTE — H&P (Signed)
[name], [name]                 ACCOUNT NO.:  [id number]   MEDICAL RECORD NO.:  [id number]          PATIENT TYPE:  INP   LOCATION:  3731                         FACILITY:  [facility]   PHYSICIAN:  Cassell Clement, M.D. DATE OF BIRTH:  Dec 29, 1950   DATE OF ADMISSION:  07/21/2006  DATE OF DISCHARGE:                              HISTORY & PHYSICAL   CHIEF COMPLAINT:  Chest pain.   HISTORY:  This is a 60 year old Caucasian female who was readmitted with  chest pain.  The patient has a history of known coronary artery disease  and had a known stent to her left anterior descending artery by Dr.  Amil Amen on June 17, 2006.  She presented with recurrent chest pain  and was readmitted on July 10, 2006 and underwent a cath on that date  and had an angioplasty to a branch vessel near the stent, according to  the patient.  Since then, she has had occasional episodes of brief chest  pressure relieved by sublingual nitroglycerin, but today while sitting  at the computer, she noted severe interscapular pain which radiated into  the precordium and was not helped by sublingual nitroglycerin.  There  was no diaphoresis, nausea or vomiting.  The patient came to the  emergency room where her initial EKG and cardiac enzymes were negative.  Because the pain is similar to that that she experienced prior to her  stent and her angioplasty, she is now being readmitted and will probably  undergo a diagnostic cath in the morning by Dr. Amil Amen or associate.   MEDICATIONS:  1. Metformin 1000 mg twice a day.  2. Nexium 40 mg twice a day.  3. Lipitor 40 mg daily.  4. Lisinopril HCT 10/12.5 once a day.  5. Zetia 10 mg once a day.  6. Lexapro 10 mg once a day.  7. Plavix 75 mg daily.  8. Aspirin 325 mg daily.   ALLERGIES:  NO KNOWN DRUG ALLERGIES.   PAST MEDICAL HISTORY:  1. Diabetes.  2. Hypertension.  3. Hypercholesterolemia.   SOCIAL HISTORY:  She does not use alcohol or tobacco.  She is a  housewife.   REVIEW OF SYSTEMS:  GI:  No ulcer history.  GU:  No genitourinary  symptoms.  ENDOCRINE:  Adult onset diabetes for less than 10 years.  Her  last metformin dose was this morning.  Remainder of review of systems is  negative in detail.   PHYSICAL EXAMINATION:  VITAL SIGNS:  Blood pressure is 110/60, pulse 90  and regular, respirations are normal.  GENERAL:  This is a mildly obese Caucasian female in no acute distress.  SKIN:  Warm and dry.  NECK:  Jugular venous pressure normal.  Carotids normal.  Thyroid  normal.  CHEST:  Clear.  HEART:  Reveals a quiet precordium without murmur, gallop, rub or click.  ABDOMEN:  No abnormal mass or tenderness.  Bowel sounds are normal.  Liver and spleen are not enlarged.  EXTREMITIES:  Show good pulses.  No edema.  No phlebitis.   CHEST X-RAY:  No active disease and the heart size  was normal.   ELECTROCARDIOGRAM:  Her electrocardiogram in the emergency room was  normal.   LABORATORY DATA:  Initial labs including CBC, BUN, and creatinine were  normal.  The blood sugar was 86.   IMPRESSION:  1. Recurrent chest pain 11 days after her most recent percutaneous      coronary intervention.  2. Diabetes mellitus, adult onset.  3. Hypertensive cardiovascular disease.  4. Hypercholesterolemia.   DISPOSITION:  We are going to admit to telemetry to Dr. Amil Amen'  service.  She will be on IV nitroglycerin, IV heparin.  Will continue  aspirin and Plavix.  We will add a beta blocker.  We will anticipate  possible cardiac catheterization on July 22, 2006.  Will get serial  enzymes and EKGs.  Further workup as per Dr. Amil Amen.           ______________________________  Cassell Clement, M.D.     TB/MEDQ  D:  07/21/2006  T:  07/21/2006  Job:  [id number]   cc:   Francisca December, M.D.  Dario Guardian, M.D.

## 2010-11-17 NOTE — Consult Note (Signed)
NAMEQUINNIE, BARCELO                 ACCOUNT NO.:  [id number]   MEDICAL RECORD NO.:  [id number]          PATIENT TYPE:  INP   LOCATION:  3731                         FACILITY:  [facility]   PHYSICIAN:  Sheliah Plane, MD    DATE OF BIRTH:  1950-08-18   DATE OF CONSULTATION:  07/23/2006  DATE OF DISCHARGE:                                 CONSULTATION   PRIMARY CARDIOLOGIST:  Francisca December, M.D.   CHIEF COMPLAINT:  Chest pain with re-stenosis of stented arteries.   HISTORY OF PRESENT ILLNESS:  The patient is a 60 year old female, who  was admitted with chest discomfort on July 21, 2006.  She has a  history of known coronary occlusive disease and had a drug eluting stent  placed in her LAD by Dr. Amil Amen on June 17, 2006.  She was  readmitted with recurrent chest pain on July 10, 2006, underwent  repeat cardiac catheterization and at that time, had angioplasty of a  diagonal branch in the vicinity of the stent.  Since then, she has had  occasional brief episodes of chest discomfort, relieved by  nitroglycerin.  On the day of admission, July 21, 2006, she had  severe scaphoid pain, radiating into the precordium that was not  initially relieved with nitroglycerin.  She had no diaphoresis, nausea  or vomiting, and came to the emergency room at [facility], where she  was admitted.  In the emergency room, she became pain-free.  Maximum  troponin in the hospital has been elevated to 0.07 with an MB of 1.1.  Repeat troponin was elevated to 0.12.  After being stabilized medically,  she underwent repeat cardiac catheterization on July 22, 2006, by Dr.  Eldridge Dace.  At that time, it was suggested to continue aggressive medical  therapy.  Aspirin and Plavix were continued.  Following the cardiac  catheterization, that evening, the patient became hypotensive with blood  pressure in the 70s, requiring a fluid bolus.  A CT scan of the abdomen  showed no evidence of retroperitoneal  bleed.  Subsequently, on January  20, the patient stabilized and films were reviewed by Dr. Amil Amen and  the patient was referred for consideration of coronary artery bypass  grafting to the LAD and diagonal.   PAST MEDICAL HISTORY:  Includes 8-10 years of diabetes.  History of  hypertension.  History of hypercholesterolemia.  She denies any previous  surgery.   SOCIAL AND FAMILY HISTORY:  The patient denies use of alcohol and  tobacco, works as a housewife.   REVIEW OF SYSTEMS:  CARDIAC:  Positive for chest pain, denies shortness  of breath.  Denies orthopnea, syncope, presyncope.  GENERAL:  Denies  fever, chills, night sweats or other constitutional symptoms.  Denies  blood in her urine.  Denies blood in her stool.  Denies claudication.  Denies amaurosis.  Denies TIAs.  Denies cold intolerance.  Other review  of systems are negative.   CURRENT MEDICATIONS INCLUDE:  1. Metformin 1000 mg twice a day, held for the cardiac      catheterization.  2. Nexium 40 mg twice a  day.  3. Lipitor 40 mg daily.  4. Lisinopril/HCT 10/12.5 once a day.  5. Zetia 10 mg once a day.  6. Lexapro 10 mg a day.  7. Plavix 75 mg a day.  8. Aspirin 325 mg a day.   The patient received Plavix on the day of consultation.   PHYSICAL EXAMINATION:  Blood pressure is 100/60, pulse is 80 and  regular, respiratory rate is 18.  The patient is awake, alert,  neurologically intact, without any distress.  She has no carotid bruits.  Her lungs are clear bilaterally.  Cardiac exam reveals regular rate and  rhythm without gallop.  Abdominal exam is benign, without palpable  masses or tenderness.  The right calf __________ but she does have  superficial varicosities.  The right leg appears to have adequate vein.  Left leg has +1 DP and PT pulses bilaterally.   LABORATORY FINDINGS:  Include EKG with normal sinus rhythm without acute  changes.  No chest x-rays are reported.   Laboratory findings revealed a hematocrit  of 28.6, white count of 7.4,  platelet count 42.  Creatinine of 1.18, glucose of 142, hemoglobin A1c  is not recorded.  Total cholesterol is 127, triglyceride is 160, HDL 37,  LDL 58.   Cardiac catheterization films are reviewed.  In the past four to five  weeks, the patient has had three cardiac catheterizations, which were  reviewed.  One angioplasty and one stent placement.  She has significant  disease in the bifurcated lesion of the mid LAD, involving the diagonal  and the LAD.  Circumflex is a small system, but without significant  disease.  The right coronary artery is a moderate sized vessel with  __________.  Overall ventricular function is preserved.   With the patient's recurrent symptoms and significant disease, involving  LAD and diagonal, already treated with stents and angioplasty, coronary  artery bypass grafting to the LAD and diagonal were recommended to the  patient.  Risks of surgery, to include death, infection,  myocardial  infarction, bleeding  have all been discussed.  Because the patient is  on Plavix, hold the Plavix, continue with aspirin, as discussed with  cardiology.  __________.   The patient is agreeable with this approach and will discuss with Dr.  Amil Amen.      Sheliah Plane, MD  Electronically Signed     EG/MEDQ  D:  07/24/2006  T:  07/24/2006  Job:  [id number]   cc:   Francisca December, M.D.

## 2010-11-17 NOTE — Cardiovascular Report (Signed)
[name], [name]                 ACCOUNT NO.:  [id number]   MEDICAL RECORD NO.:  [id number]          PATIENT TYPE:  INP   LOCATION:  6523                         FACILITY:  [facility]   PHYSICIAN:  Francisca December, M.D.  DATE OF BIRTH:  23-Nov-1950   DATE OF PROCEDURE:  07/11/2006  DATE OF DISCHARGE:                            CARDIAC CATHETERIZATION   PROCEDURES PERFORMED:  1. Left heart catheterization.  2. Left ventriculogram.  3. Coronary angiography.  4. Percutaneous coronary intervention/balloon dilatation, first      diagonal branch.   INDICATIONS:  [name] is a 60 year old woman who is now 3 weeks S/P  PCI and DE stent in the mid LAD.  There was some jailing of the  diagonal branch.  Final kissing balloons were used.  The LAD stent did  not fully expand despite very high-pressure dilatation.  She was  admitted yesterday after a prolonged episode of chest pain and has had a  mild elevation in CK-MB and troponin.  She is therefore brought to the  catheterization laboratory to evaluate the stented segment and provide  further therapeutic options.   PROCEDURAL NOTE:  The patient was brought to the cardiac catheterization  laboratory in the fasting state.  The right groin was prepped and draped  in the usual sterile fashion.  Local anesthesia was obtained with the  infiltration of 1% lidocaine.  A 5-French catheter sheath was inserted  percutaneously into the right femoral artery utilizing an anterior  approach over a guiding J-wire.  Left heart catheterization and coronary  angiography then proceeded in a standard fashion using 5-French #4 left  and right Judkins catheters and a 110-cm pigtail catheter.  A 30-degree  RAO cine left ventriculogram was performed utilizing the power injector.  Thirty-nine milliliters were injected at 13 mL per second.   We then proceeded with percutaneous intervention.  The 5-French catheter  sheath was exchanged for a 6-French catheter over a  long guiding J-wire.  The patient received 4000 units of heparin intravenously.  She was  already n a constant infusion of the heparin and Integrilin.  The final  ACT was greater than 300.  A 6-French 3.0 CLS guiding catheter was  advanced to the ascending aorta, where the left coronary os was engaged.  A 0.014 inch Whisper wire was then advanced across the subtotal  occlusion of the origin of the diagonal branch.  The LAD stent appeared  reasonably patent.  There is perhaps a residual 30% stenosis in the LAD.  The diagonal branch was then balloon-dilated three times using a 2.5/15  mm Scimed Maverick intracoronary balloon.  Initial balloon dilatation  was 8 atmospheres for 90 seconds.  Successive balloon dilatations were 6  atmospheres for approximately 1 minute each.  Following this  intervention there was a 20-30% residual stenosis in the origin of the  diagonal branch.  The geometry of the LAD stent itself and the degree of  stenosis within the LAD did not appear to have changed.  Therefore,  after consultation with Dr. Darci Needle and full review of the  cine angiograms, we elected to not proceed with reinstrumentation of the  LAD stent itself.  Therefore, the guidewire and guiding catheter were  removed.  A right femoral arteriogram in the 45-degree RAO angulation  via the catheter sheath by hand injection documented adequate anatomy  for placement of the percutaneous closure device Angio-Seal.  This was  subsequent deployed with good hemostasis and intact distal pulse.   HEMODYNAMICS:  Systemic arterial pressure was 111/62 with a mean of 82  mmHg.  There was no systolic gradient across the aortic valve.  The left  ventricular end-diastolic pressure was 9 mmHg pre-ventriculogram.   ANGIOGRAPHY:  The left ventriculogram demonstrated normal chamber size  and normal global systolic function.  A visual estimate of the ejection  fraction is 65%.  There is focal anterobasal  hypokinesis.  There is  right coronary calcification seen, minimal left coronary calcification.   There is a right-dominant coronary system present.  The main left  coronary artery was normal.   The left anterior descending artery and its branches were diseased.  The  stented segment in the midportion did have a residual stenosis, as was  the case at the completion of the procedure 3 weeks ago.  It was  approximately 30% stenotic.  There was a 95% eccentric stenosis at the  origin of the diagonal branch, which is in stent jail.  The ongoing  anterior descending artery is without obstruction.  There are no other  significant diagonal branches.   The left circumflex coronary artery and its branches are relatively  small.  The left circumflex gives rise to two small marginal branches  only.  There are no obstructions in the circumflex artery.   The right coronary artery is large and dominant.  There are luminal  irregularities in the midportion of the right coronary.  The ongoing  vessel again is large, gives rise to a large posterior descending artery  and a large posterolateral segment.  The posterolateral segment gives  rise to three small left ventricular branches and one large fourth left  ventricular branch.  There are no obstructions in the distal right  coronary or its branches.   Following balloon dilatation of the origin of the diagonal branch, there  is a residual 20-30% stenosis.   FINAL IMPRESSION:  1. Atherosclerotic cardiovascular disease, single vessel.  2. Status post successful balloon dilatation only of the origin of the      diagonal branch.  3. Typical angina was reproduced with device insertion and balloon      inflation.  4. Intact left ventricular size and global systolic function.      Ejection fraction 65%.  Regional wall motion abnormality is noted.      Francisca December, M.D.  Electronically Signed     JHE/MEDQ  D:  07/11/2006  T:  07/12/2006   Job:  [id number]   cc:   Dario Guardian, M.D.

## 2010-11-17 NOTE — Discharge Summary (Signed)
NAMETAMERRA, MERKLEY                 ACCOUNT NO.:  [id number]   MEDICAL RECORD NO.:  [id number]          PATIENT TYPE:  INP   LOCATION:  2918                         FACILITY:  [facility]   PHYSICIAN:  Armanda Magic, M.D.     DATE OF BIRTH:  [birth year]   DATE OF ADMISSION:  12/01/2008  DATE OF DISCHARGE:  12/05/2008                               DISCHARGE SUMMARY   DISCHARGE DIAGNOSES:  1. Chest pain syndrome with no changes in cardiac catheterization,      questionable microvascular disease.  2. Coronary artery disease, stable.  3. Diabetes mellitus.  4. Hypertension.  5. Hyperlipidemia.  6. Depression.  7. Restless legs syndrome.   HOSPITAL COURSE:  [name] is a 60 year old female who presented to the  office on December 01, 2008, the day of admission, after having 3 days of  shoulder pain that was responding to sublingual nitroglycerin.  When  getting into a shower on the day of admission she began having dyspnea  and sweats.  She also had mild anterior chest pressure.  This was  similar to what the discomfort she had in the past.  She has known  coronary artery disease and has had bypass around 2008.   Her cardiac enzymes were negative but because of her persistent pain we  went ahead with cardiac catheterization.  Her ejection fraction was 45-  50% with anterior apical hypokinesis.  She had a 70% mid LAD lesion of  the first diagonal.  The first diagonal was widely patent.  The  circumflex was normal but small in caliber.  The right coronary artery  was large and dominant but had no disease.  The LIMA had no significant  obstruction.  The patient was felt to have microvascular angina and her  Ranexa was increased.  We watched her over the next several days weaning  off her IV nitroglycerin.  On December 05, 2008, she was felt to be ready to  go home.   DISCHARGE LABORATORY DATA:  Sodium 131, potassium 4.2, BUN 18, and  creatinine 1.15.  Hemoglobin 12.6, hematocrit 37.0, platelets 241,  and  white count 9.4.  Total cholesterol 127, triglycerides 189, HDL 38, and  LDL 51.  TSH 0.262 and this will need to be worked up by her primary  care physician.   She is to remain on a low-sodium, heart-healthy diet.  Activity; no  restrictions.  Follow with Dr. Amil Amen in 2 week.  She is to call for  this appointment.   DISCHARGE MEDICATIONS:  1. Enteric-coated aspirin 325 mg a day.  2. Plavix 75 mg a day.  3. Lasix 40 mg a day.  4. Folic acid 1 mg daily.  5. Ranexa 1000 mg p.o. b.i.d.  6. Glipizide 5 mg p.o. b.i.d.  7. Metoprolol tartrate 25 mg tablet one-half twice a day.  8. Zetia 10 mg a day.  9. Imdur 60 mg daily.  10.Metformin 500 mg 2 tablets twice a day.  She is to restart this on      December 06, 2008.  11.Potassium 20 mEq daily.  12.Lipitor  80 mg once a day.  13.Norvasc 5 mg a day.  14.Nexium 40 mg a day.  15.Lexapro 20 mg a day.      Guy Franco, P.A.      Armanda Magic, M.D.  Electronically Signed    LB/MEDQ  D:  01/11/2009  T:  01/12/2009  Job:  [id number]   cc:   Francisca December, M.D.  Dario Guardian, M.D.

## 2010-11-17 NOTE — H&P (Signed)
[name], [name] NO.:  [id number]   MEDICAL RECORD NO.:  [id number]          PATIENT TYPE:  INP   LOCATION:  4707                         FACILITY:  [facility]   PHYSICIAN:  Lyn Records, M.D.   DATE OF BIRTH:  07/15/1950   DATE OF ADMISSION:  07/10/2006  DATE OF DISCHARGE:                              HISTORY & PHYSICAL   PRIMARY CARE PHYSICIAN:  Merri Brunette, MD   CARDIOLOGIST:  Corliss Marcus, MD   CHIEF COMPLAINTS:  Chest pain.   HISTORY OF PRESENT ILLNESS:  [name] is a 60 year old Caucasian  female with a known history of two-vessel coronary artery disease,  status post bare-metal stent implantation to the mid LAD and balloon  dilatation to the diagonal #1 on June 17, 2006, hypertension,  dyslipidemia, and diabetes mellitus.  The patient complained of a sudden  onset of nonexertional, intermittent chest pain described as heaviness  while driving today at 1:61 p.m.  She rated the pain at 10/10 and it was  located at the upper left breast and radiated to her left axilla and  left arm.  She took sublingual nitroglycerin x1 with some chest pain  relief.  She then got out of her car to pick up her grandchildren from  school and while walking back to the car, experienced a recurrence of  the chest pain also rated as 10/10 with the duration of 60 minutes.  She  took sublingual nitroglycerin with some relief.  She admitted to  shortness of breath, diaphoresis, dizziness, nausea, and vomiting.  She  was brought to the [facility] ED via EMS.  While in the EMS vehicle, she  was treated with aspirin 81 mg x4 plus sublingual nitroglycerin x2 with  chest pain relief.  She denies a previous history of chest pain and  states that her chest pain worsened with deep inspiration; however,  there was no association with movement or meals.  She also stated that  the chest pain improved with palpation of the upper left breast area.  Upon arrival to the [facility] ED,  a 12-lead EKG was obtained revealing  normal sinus rhythm at 82 beats per minute with nonspecific T-wave  abnormalities with T-wave inversion in leads aVL, V1 and V2.  There were  no ischemic changes.  This EKG is consistent with a prior EKG dated  June 18, 2006.  Point-of-care enzymes x2 revealed an elevated  troponin I x1 at 0.25.  Chest x-ray revealed mild peribronchial  thickening without focal airspace disease.  The patient admits to  currently being chest-pain-free.   PAST MEDICAL HISTORY:  1. Two-vessel coronary artery disease, status post bare-metal stent      implantation to the mid LAD and PTA to the diagonal #1 on June 17, 2006.  2. Hypertension.  3. Dyslipidemia.  4. Diabetes mellitus.  5. GERD.  6. Depression.   ALLERGIES:  NO KNOWN DRUG ALLERGIES.   CURRENT MEDICATIONS:  1. Metformin 1000 mg twice daily (but hold).  2. Nexium 40 mg daily.  3. Lipitor 40 mg  daily.  4. Lisinopril/ hydrochlorothiazide 10/12.5 mg daily.  5. Zetia 10 mg daily.  6. Lexapro 10 mg daily.  7. Plavix 75 mg daily.   PAST SURGICAL HISTORY:  1. Status post bare-metal stent implantation to the mid LAD, June 17, 2006.  2. Status post tubal ligation approximately 30 years ago.   FAMILY HISTORY:  1. Mother deceased at age 71 -- irregular heart beat, coronary artery      disease, diabetes mellitus, dyslipidemia, hypertension, and      emphysema.  2. Father unknown.   SOCIAL HISTORY:  Married.  [name] cares for her grandchildren during  the day.  She admits to being a former tobacco user with occasional  cigarette use for 1 month with cessation 20 years ago.  She currently  denies tobacco, alcohol, or illicit drug use.   REVIEW OF SYSTEMS:  All other systems reviewed are negative other than  what is stated in the HPI.   PHYSICAL EXAM:  GENERAL:  A 60 year old female, pleasant and  cooperative, NAD.  VITALS:  Temperature 97.8, blood pressure 93/61, pulse 81,  respirations  12, O2 saturations 95% over room air.  HEENT:  Unremarkable.  NECK:  Supple without JVD, lymphadenopathy, or bilateral bruits.  Carotid upstrokes 2+.  PULMONARY:  Breath sounds are equal and clear to auscultation  bilaterally.  No use of accessory muscles.  CV:  Regular rate and rhythm.  Normal S1 and S2 without murmur, gallops,  clicks or rubs.  ABDOMEN:  Soft, nontender, nondistended with active bowel sounds.  No  masses, organomegaly, or bilateral bruits.  EXTREMITIES:  No peripheral  edema.  DP pulses 2+/2, bilaterally.  SKIN:  Warm and dry without rashes or lesions.  NEUROLOGIC:  No focal deficits.  PSYCHIATRIC:  Normal mood and affect.  BACK:  No kyphosis or scoliosis.   LABORATORY DATA:  1. Hemoglobin 11.9, hematocrit 35.0.  Sodium 135, potassium 4.2,      chloride 105, bicarbonate 26.1, BUN 27, creatinine 1.0, glucose      153.  PT 13.3, PTT 29, INR 1.0.  Point-of-care enzymes:  Myoglobin      171 and 376, respectively; CK-MB less than 1.0 and 4.0,      respectively.  Troponin I less than 0.05 and 0.25, respectively.   1. EKG as stated in the HPI.   Chest x-ray as stated in the HPI.   ASSESSMENT:  1. Acute non-ST-segment-elevation myocardial infarction with an      elevated troponin I at 0.25.  2. Two-vessel coronary artery disease, status post bare-metal stent      implantation to the mid left anterior descending and percutaneous      transluminal angioplasty to the diagonal #1, December 2007.  3. Hypertension, controlled.  4. Dyslipidemia.  5. Gastroesophageal reflux disease.  6. Diabetes mellitus.  7. Depression.   PLAN:  1. Admit to the cardiac telemetry unit under the service of Corliss Marcus, MD with a diagnosis of NSTEMI.  2. Start IV heparin per pharmacy protocol, IV nitroglycerin, and IV      Integrilin.  Titrate nitroglycerin from 5 mcg to 10 mcg/min,     keeping a systolic blood pressure greater than or equal to 100.  3. Continue home  medications with the exception of putting metformin      on hold.  4. BMET, CBC, fasting lipid panel, and EKG in the a.m.  5. N.p.o. after midnight, except for  medications.  6. Start sliding-scale insulin.  7. Cardiac catheterization on July 11, 2006 after 2 p.m., to be      performed by Dr. Corliss Marcus.  The cardiac catheterization      procedure, risks, and potential complications were explained to the      patient by Dr. Verdis Prime including MI, CVA, death, IV contrast      dye allergy, renal insufficiency, and vascular complications      including bleeding, oozing, hematoma, and pseudoaneurysm.  8. The patient was seen, interviewed, and examined by Dr. Verdis Prime,      who participated in the medical decision-making and plan of care.  9. To hold lisinopril/hydrochlorothiazide for heart rate less than 60      or a systolic blood pressure less than 100.  10.Precardiac catheterization orders are sent to chart.  11.Start enteric-coated aspirin 325 mg daily.  12.Cardiac panel including troponin I q.8 h. x3.  13.Check magnesium, hemoglobin A1c, and BNP.      Tylene Fantasia, [place]      Lyn Records, M.D.  Electronically Signed    RDM/MEDQ  D:  07/10/2006  T:  07/11/2006  Job:  [id number]   cc:   Dario Guardian, M.D.

## 2010-11-17 NOTE — Discharge Summary (Signed)
[name], [name]                 ACCOUNT NO.:  [id number]   MEDICAL RECORD NO.:  [id number]          PATIENT TYPE:  INP   LOCATION:  2003                         FACILITY:  [facility]   PHYSICIAN:  Sheliah Plane, MD    DATE OF BIRTH:  [birth year]   DATE OF ADMISSION:  07/21/2006  DATE OF DISCHARGE:                               DISCHARGE SUMMARY   ADDENDUM:  [name] did undergo a CT scan of the abdomen following her  cardiac catheterization to rule out retroperitoneal bleed which was  indeed ruled out.  However, incidentally on the CT scan it was noted  that she had some significant mesenteric and retroperitoneal adenopathy  and borderline splenomegaly which were suspicious for lymphoma.  It was  recommended that following her recovery from surgery that she undergo a  follow-up scan and  any other follow-up diagnostic testing necessary for  this issue.      Coral Ceo, P.A.      Sheliah Plane, MD  Electronically Signed    GC/MEDQ  D:  07/30/2006  T:  07/30/2006  Job:  [id number]   cc:   Francisca December, M.D.  Dario Guardian, M.D.  [facility] office

## 2010-11-17 NOTE — Discharge Summary (Signed)
[name], [name]                 ACCOUNT NO.:  [id number]   MEDICAL RECORD NO.:  [id number]          PATIENT TYPE:  OIB   LOCATION:  6532                         FACILITY:  [facility]   PHYSICIAN:  Francisca December, M.D.  DATE OF BIRTH:  February 16, 1951   DATE OF ADMISSION:  06/17/2006  DATE OF DISCHARGE:  06/18/2006                               DISCHARGE SUMMARY   DISCHARGE DIAGNOSES:  1. Single-vessel coronary artery disease status post bare mental stent      to the left anterior descending.  2. Hypertension.  3. Hypercholesterolemia.  4. Diabetes mellitus.  Patient is to hold Glucophage until June 20, 2006.  5. Depression.  6. Gastroesophageal reflux disease.  7. Long-term medication use.   HISTORY OF PRESENT ILLNESS:  [name] is a 60 year old female with  significant risk factors for coronary artery disease as listed above.  She underwent a stress Cardiolite for her shortness of breath and  choking sensations.  She was only able to remain on the treadmill for  a couple of minutes and the test was stopped.  For this reason, Dr.  Amil Amen felt that cardiac catheterization was warranted.  She then  underwent a cardiac catheterization at the [facility].  This was shown to have a single-vessel LAD as well as decreased LVEF at  46%.  She was then brought over to [facility] on June 17, 2006  for PCI.   The patient underwent a PCI of the mid LAD reducing the 80% lesion to a  20% lesion post procedure, kissing and balloon procedure at the mid LAD  and second large diagonal.  Her typical angina was produced during the  procedure.  The diagonal was hazy but had TIMI-3 flow.   She tolerated the procedure well and overnight she remained stable  enough for [place] to feel that she was ready for discharge to home.  Her lab  studies were essentially normal with exception of her hemoglobin was  10.8; this is down from 12.0.   DISCHARGE MEDICATIONS:  She was discharged to  home in stable condition  on the following medications:  1. Metformin 500 mg twice a day.  This was to be resumed on June 20, 2006.  2. Lisinopril/HCTZ 10/12.5 mg once a day.  3. Lexapro 10 mg once a day.  4. Zetia 10 mg a day.  5. Lipitor 40 mg a day.  6. Nexium 40 mg a day.  7. Omega-3 fatty acid as before.  8. Plavix 75 mg a day.  9. Enteric-coated aspirin 325 mg a day.   DISCHARGE INSTRUCTIONS:  She is to increase activity slowly as per  cardiac rehabilitation.  Follow up with Kelli Franco, PA/Dr. Amil Amen on  June 27, 2006 at 10:30 a.m.  She is to remain on a low-fat/low-  cholesterol diabetic diet.      [name], P.A.      Francisca December, M.D.  Electronically Signed    LB/MEDQ  D:  06/18/2006  T:  06/18/2006  Job:  [id number]  cc:   Francisca December, M.D.

## 2010-11-17 NOTE — Discharge Summary (Signed)
NAMEJAMIE, HAFFORD                 ACCOUNT NO.:  [id number]   MEDICAL RECORD NO.:  [id number]          PATIENT TYPE:  INP   LOCATION:  6523                         FACILITY:  [facility]   PHYSICIAN:  Francisca December, M.D.  DATE OF BIRTH:  1950/12/28   DATE OF ADMISSION:  07/10/2006  DATE OF DISCHARGE:  07/13/2006                               DISCHARGE SUMMARY   DISCHARGE DIAGNOSES:  1. Non-ST segment myocardial infarction with angioplasty of the      diagonal lesion.  2. Coronary artery disease.  3. Hypertension.  4. Dyslipidemia.  5. Gastroesophageal reflux disease.  6. Depression.  7. Diabetes mellitus, oral agent therapy.  8. Family history coronary artery disease.  9. Former smoker.  10.Longterm medication use.   Ms. Gains is a 60 year old female with a known history of coronary  artery disease who had undergone drug-eluting stent placement to the LAD  with underlying dilatation to the first diagonal in December of 2007.  She presented to the hospital on July 02, 2006 with recurrent angina.  Ultimately, her lab studies revealed her troponin at 1.17.  An EKG was  consistent with non-ST segment myocardial infarction.  She was placed on  aspirin, Integrilin and IV nitroglycerin.  The following day, she  underwent cardiac catheterization and was found to have an  angiographically normal right coronary artery and left circumflex.  Her  LAD had a residual 30% in-stent restenosis.  A 90% ostial diagonal  lesion was found and this area was angioplastied only because of its  size.  The patient tolerated the procedure well and was Angiosealed.  She was monitored in the hospital for the next couple of days and by  July 13, 2006, she was ready for discharge to home.  Cardiac rehab  was initiated.   LAB STUDIES:  Include a hemoglobin of 11.5, hematocrit 32.7, platelets  237, white count 8.4.  Hemoglobin A1c 6.5.  marinum CK-MB 123/16.8 with  maximum troponin at 3.17.  Total  cholesterol 156, triglycerides 177, LDL  86, HDL 35.   DISCHARGE MEDICATIONS:  Include:  1. Enteric-coated aspirin 325 mg a day.  2. Lisinopril/HCTZ 10/12.5 mg daily.  3. Lipitor 40 mg a day.  4. Zetia 10 mg a day.  5. Nexium 40 mg a day.  6. Plavix 75 mg 1 tablet twice a day for 7 days and then 1 tablet      daily.  7. Lexapro 10 mg a day.  8. Nitroglycerin sublingual chest pain.   No lifting over 10 pounds for 1 week.  Increase activity slowly.  Remain  on a low-fat diet.  She is not to resume her metformin for, at least, 2  days postprocedure.  She is to follow back up with Dr. Angelina Sheriff, PA-C on July 23, 2006 at 11 a.m.  She is to call for any  questions or concerns.      Guy Franco, P.A.      Francisca December, M.D.  Electronically Signed    LB/MEDQ  D:  08/13/2006  T:  08/14/2006  Job:  [id number]  cc:   Judithann Sauger, M.D.

## 2010-11-17 NOTE — Discharge Summary (Signed)
NAMEAIMY, [name]                 ACCOUNT NO.:  [id number]   MEDICAL RECORD NO.:  [id number]          PATIENT TYPE:  INP   LOCATION:  2914                         FACILITY:  [facility]   PHYSICIAN:  Francisca December, M.D.  DATE OF BIRTH:  Sep 15, 1950   DATE OF ADMISSION:  01/31/2008  DATE OF DISCHARGE:  02/04/2008                               DISCHARGE SUMMARY   DISCHARGE DIAGNOSES:  1. Coronary artery disease.  2. Coronary vasospasm.  3. Hypertension.  4. Diabetes mellitus.  5. Diastolic heart failure, compensated.  6. Long-term medication use.   HOSPITAL COURSE:  [name] is a 60 year old female who presented to  the emergency room with 24 hours of unstable angina.  She took 1  nitroglycerin the day before her admission and then ran out.  On the day  of admission, she had more shoulder discomfort, substernal chest pain,  and nausea.  Because of this, she presented to the emergency room.  In  addition, she had run out of nitroglycerin at home.  Her symptoms were  similar to her complaints in November 2008 that showed severe single  vessel 90% diagonal disease.   Nonetheless, she was admitted to [facility] for this episode of chest  pain.  She ruled out for acute myocardial infarction.   Other lab work showed TSH 0.469, sodium 140, potassium 4.3, BUN 12,  creatinine 0.98, hemoglobin 10.4, hematocrit 30.3, white count 9.1, and  platelets 243.   Because of her chest pain and known coronary artery disease (known  CABG), she was taken to the nuclear lab and underwent a  radiopharmaceutical stress test, which showed a new large crisis of  exercise-induced myocardial ischemia in the anterior and anteroseptal  walls.  EF was 53%.  Because of the positive stress test, she was then  taken to the cath lab the next day, and this showed the following:  25%  mid RCA lesion, 80% mid LAD in-stent stenosis, 95% ostial SP1.  The  grafts showed:  LIMA to the LAD apparently patent, saphenous  vein graft  to the first diagonal apparently patent, the saphenous vein graft to the  first diagonal was occluded, and there was collateral blood flow in the  right PDA to the septals.  Harvest was then performed and showed no  significant obstruction to the LAD diagonal stent.  The smallest  diameter was 1.8 x 1.7 mm.  Distal measurements were 2.1 x 1.8 mm.  There was focal spasm noted in the mid body of the LIMA graft.  This  slowly resolved after intracoronary nitroglycerin.  Dr. Amil Amen felt  also that the collaterals to the SP1 may be contributing to angina.  For  these reasons, he recommended high-dose oral and topical nitroglycerin  with other medical management.  The patient remained in the hospital  overnight and was felt to be ready for discharge to home the following  day.   DISCHARGE MEDICATIONS:  1. Lisinopril 5 mg a day.  2. Ranexa 1000 mg twice a day.  3. Folic acid 1 tablet daily.  4. Aspirin 325 mg a  day.  5. Topical nitroglycerin patch 0.4 mg/hour, apply to skin in arm,      remove at bedtime.  6. Imdur 60 mg 1 p.o. daily.   ACTIVITY:  No restrictions.   Follow up with Tamera C. Melvyn Neth, nurse practitioner/Dr. Amil Amen on February 17, 2008, at 9:30 a.m.      Guy Franco, P.A.      Francisca December, M.D.  Electronically Signed    LB/MEDQ  D:  03/11/2008  T:  03/12/2008  Job:  [id number]   cc:   Dario Guardian, M.D.

## 2010-11-17 NOTE — Cardiovascular Report (Signed)
NAMELEONDRA, CULLIN                 ACCOUNT NO.:  [id number]   MEDICAL RECORD NO.:  [id number]          PATIENT TYPE:  INP   LOCATION:  3731                         FACILITY:  [facility]   PHYSICIAN:  Corky Crafts, MDDATE OF BIRTH:  1950-09-07   DATE OF PROCEDURE:  07/22/2006  DATE OF DISCHARGE:                            CARDIAC CATHETERIZATION   REFERRING PHYSICIAN:  Francisca December, M.D.   PROCEDURES PERFORMED:  1. Left heart catheterization.  2. Left ventriculogram.  3. Coronary angiogram.  4. IVUS at diagonal #1 and left main.   OPERATOR:  Corky Crafts, MD   INDICATIONS:  Chest pain and prior coronary artery disease.   PROCEDURAL NARRATIVE:  The risks and benefits of cardiac catheterization  and PCI were explained to the patient. Informed consent was obtained.  The patient was brought to the catheterization lab, and she was prepped  and draped in the usual sterile  fashion.  The left groin was  infiltrated with 1% lidocaine.  A 6-French arterial sheath was placed  into the left femoral artery using modified Seldinger technique.  Left  coronary artery angiography was initially attempted with a JL-3.5  catheter.  The catheter was advanced to the vessel ostium under  fluoroscopic guidance.  Digital angiography was performed in multiple  projections using hand injection of contrast.  Damping of the catheter  was noted as we were waiting for a guiding catheter with side holes to  come to the room.  The right coronary artery angiography was performed  using a JR-4.0 catheter.  The catheter was advanced to the vessel ostium  under fluoroscopic guidance.  Digital angiography was performed in  multiple projections using hand injection of contrast.  Engaging the  left main was attempted with several different guiding catheters with  side holes.  None of them were successful.  We then used a 5-French JL-  3.5 guiding catheter to take the remainder of the pictures.  A CLS  3.0  guiding catheter was used to intubate the ostium of the left main.  A  Prowater wire was placed down the LAD and into the first diagonal.  The  catheter was then advanced as far as it would go which was just to the  ostium of the diagonal, past the LAD stent. Pullback was performed at  this location. Pullback was then performed in the ostium of the left  main with the guiding catheter in the aorta. Femoral angiography was  then performed, but the vessel was small and not felt suitable for  closure. Heparin was used for anticoagulation.   FINDINGS:  1. Left main has an ostial 30% lesion with mild catheter dampening.  2. The left circumflex is a small vessel which is patent with only      mild irregularities.  3. The right coronary artery is a large dominant vessel with mild      luminal irregularities.  4. The left anterior descending is a medium-size vessel with a 40-50%      mid vessel stenosis. The first diagonal is hazy at the ostium, but  there is normal flow.  The remainder of the LAD is small and      diffusely diseased.  5. The left ventriculogram shows normal ventricular function with an      EF of 55%.  6. Hemodynamics showed left ventricular pressure was 103/5 with an      LVEDP of 13 mmHg.  The aortic pressure is 95/52 with a mean aortic      pressure of 73 mmHg.  7. IVUS at the ostium of the diagonal #1 showed cross-sectional area      of 2.4 mm squared with diffuse circumferential disease.  We did not      pass the IVUS catheter into the vessel.  The ostium of the left      main showed eccentric plaque with a cross-sectional area of 7.09 mm      squared.   IMPRESSION:  1. Patent mid left anterior descending stent with a 50-60% mid vessel      stenosis.  2. Significant stenosis of the ostium of the diagonal #1 at prior      percutaneous transluminal cardiac angioplasty site.  3. Normal left ventricular function.  4. Normal hemodynamics.  5. IVUS showing  diffuse disease at the ostium of the diagonal #1 with      cross-sectional area 2.4 mm2.  6. There is mild eccentric plaquing at the ostium of the left main      without significant stenosis.  The cross-sectional area was 7.09      mm2.   RECOMMENDATIONS:  1. Continue aggressive medical therapy including aspirin and Plavix.      If angina persists, would consider 2 vessel CABG given difficulties      expanding the mid LAD stent.  2. Consider adding antianginal therapy including long-acting nitrates.  3. The patient will be watched overnight.      Corky Crafts, MD  Electronically Signed     JSV/MEDQ  D:  07/22/2006  T:  07/22/2006  Job:  [id number]

## 2010-11-17 NOTE — Discharge Summary (Signed)
NAMEARANDA, BIHM                 ACCOUNT NO.:  [id number]   MEDICAL RECORD NO.:  [id number]          PATIENT TYPE:  INP   LOCATION:  2003                         FACILITY:  [facility]   PHYSICIAN:  Sheliah Plane, MD    DATE OF BIRTH:  [birth year]   DATE OF ADMISSION:  07/21/2006  DATE OF DISCHARGE:  07/30/2006                               DISCHARGE SUMMARY   PRIMARY ADMITTING DIAGNOSIS:  Chest pain.   ADDITIONAL/DISCHARGE DIAGNOSES:  1. Coronary artery disease status post stent to her left anterior      descending in December of 2007.  2. Recurrent angina.  3. Hypertension.  4. Type 2 diabetes mellitus.  5. Hypercholesterolemia.   PROCEDURES PERFORMED:  1. Cardiac catheterization.  2. Off-pump coronary artery bypass grafting x2 (left internal mammary      artery to the left anterior descending, saphenous vein graft to the      diagonal).  3. Endoscopic vein harvest, right thigh.   HISTORY:  The patient is a 60 year old white female with a known history  of coronary artery disease.  She is status post a drug-eluting stent  placement in her LAD by Dr. Amil Amen on June 17, 2006.  Since that  time, she was readmitted once on July 10, 2006 with recurrent chest  pain, and at that time underwent a repeat cardiac catheterization which  showed a blockage in the diagonal, for which she underwent an  angioplasty.  Since that time, she has had intermittent brief episodes  of chest discomfort which were relieved by nitroglycerin.  On the date  of this admission, however, she developed severe right-sided chest pain  mainly in the scaphoid area which radiated into the chest which was not  relieved with nitroglycerin.  She presented to the emergency department  at [facility] for further evaluation and was subsequently admitted for  cardiac workup.   HOSPITAL COURSE:  She was stabilized medically and her pain resolved  following her admission.  Her peak troponin was 0.12.  She underwent  a  repeat cardiac catheterization on July 22, 2006 by Dr. Eldridge Dace, and  at that time it was recommended that she continue aggressive medical  therapy including aspirin and Plavix.  Following her catheterization,  however, she became hypotensive with blood pressure in the 70s.  A CT  scan of the abdomen was performed which showed no evidence of  retroperitoneal bleed.  She subsequently stabilized, her films were  reviewed by Dr. Amil Amen, and she was then referred to Dr. Sheliah Plane for consideration of surgical revascularization.  Dr. Tyrone Sage  reviewed her films and agreed that her best course of action at this  time would be to proceed with surgical revascularization in light of the  fact that she had failed both percutaneous intervention and medical  management.  Because the patient had been on Plavix continuously up to  this point, it was recommended also that she undergo a Plavix washout  over the course of the next 5-7 days prior to surgery, provided that she  remained stable.  Her preoperative workup was undertaken in  the interim,  and she underwent carotid Dopplers which showed no significant ICA  stenosis and ABIs which were greater than 1.0 bilaterally.  She was  continued on Lovenox, aspirin and a beta blocker and remained stable and  pain free in the interim.  She was taken to the operating room on  July 26, 2006 and underwent off-pump CABG x2 as described in detail  above performed by Dr. Tyrone Sage.  She tolerated  the procedure well and  was transferred to the SICU in stable condition.  She was able to be  extubated shortly after surgery.  She was hemodynamically stable and off  all drips on postoperative day #1.  She was kept in the unit for further  monitoring.  Her chest tubes and hemodynamic monitoring devices were  removed.  She was mobilized by cardiac rehab phase one.  Late in the day  postoperative day #2, she was able to be transferred to the floor.   Overall, she has done very well postoperatively.  Her ambulation was  progressing as tolerated and currently she is ambulating independently  with her husband, as well as with cardiac rehab phase one.  She has been  mildly volume overloaded and is presently being diuresed.  She has had a  mild anemia during her entire hospital stay; however, this has remained  stable and has required no intervention.  She has been afebrile and all  vital signs have been stable.  She is maintaining normal sinus rhythm.  Her incisions are all healing well.  Her labs on postoperative day #3  show hemoglobin of 10, hematocrit 28.5, platelets 200, white count 9.1,  sodium 135, potassium 4.2, BUN 15, creatinine 0.9.  Her most recent  chest x-ray was stable with mild atelectasis bilaterally.  She will be  reevaluated on the morning of postoperative day #4, July 30, 2006.  If she has continued to remain stable at that point and looks good on  morning round evaluation, she will hopefully be ready for discharge  home.   DISCHARGE MEDICATIONS:  1. Enteric-coated aspirin 325 mg daily.  2. Lipitor 40 mg daily.  3. Toprol XL 25 mg daily.  4. Folic acid 1 mg daily.  5. Plavix 75 mg daily.  6. Glucophage 1000 mg b.i.d.  7. Nexium 40 mg b.i.d.  8. Lexapro 10 mg daily.  9. Tylox one to two q.4h. p.r.n. for pain.   DISCHARGE INSTRUCTIONS:  She is asked to refrain from driving, heavy  lifting or strenuous activity.  She may continue ambulating daily and  using her incentive spirometer.  She may shower daily and clean her  incisions with soap and water.  She will continue a low-fat, low-sodium,  carbohydrate-modified diet.   DISCHARGE FOLLOWUP:  She will see Dr. Amil Amen back the office on  August 16, 2006 with a chest x-ray.  She will then see Dr. Tyrone Sage  the following week, and our office will contact her with an appointment  date and time.  In the interim, if she experiences any problems or has questions,  she is asked to contact our office immediately.      Coral Ceo, P.A.      Sheliah Plane, MD  Electronically Signed    GC/MEDQ  D:  07/29/2006  T:  07/29/2006  Job:  [id number]   cc:   Francisca December, M.D.  Dario Guardian, M.D.  [facility] office

## 2010-12-07 ENCOUNTER — Other Ambulatory Visit (HOSPITAL_COMMUNITY)
Admission: RE | Admit: 2010-12-07 | Discharge: 2010-12-07 | Disposition: A | Discharge status: Home / Self Care | Payer: BC Managed Care – PPO | Source: Ambulatory Visit | Attending: Family Medicine | Admitting: Family Medicine

## 2010-12-07 DIAGNOSIS — Z0000 Encounter for general adult medical examination without abnormal findings: Secondary | ICD-10-CM | POA: Insufficient documentation

## 2010-12-08 ENCOUNTER — Other Ambulatory Visit: Payer: Self-pay | Admitting: Family Medicine

## 2011-01-26 ENCOUNTER — Ambulatory Visit (HOSPITAL_COMMUNITY): Admission: RE | Admit: 2011-01-26 | Payer: BC Managed Care – PPO | Source: Ambulatory Visit

## 2011-01-30 ENCOUNTER — Ambulatory Visit (HOSPITAL_COMMUNITY)
Admission: RE | Admit: 2011-01-30 | Discharge: 2011-01-30 | Disposition: A | Discharge status: Home / Self Care | Payer: BC Managed Care – PPO | Source: Ambulatory Visit | Attending: Cardiology | Admitting: Cardiology

## 2011-01-30 DIAGNOSIS — R072 Precordial pain: Secondary | ICD-10-CM | POA: Insufficient documentation

## 2011-03-27 LAB — CREATININE, SERUM
Creatinine, Ser: 0.86
GFR calc Af Amer: >60
GFR calc non Af Amer: >60

## 2011-03-27 LAB — BUN: BUN: 17

## 2011-03-30 LAB — BASIC METABOLIC PANEL
BUN: 12
BUN: 19
BUN: 19
CO2: 27
CO2: 27
CO2: 27
Chloride: 102
Chloride: 103
Chloride: 103
Creatinine, Ser: 1.09
GFR calc Af Amer: >60
GFR calc Af Amer: >60
Glucose, Bld: 112 — ABNORMAL HIGH
Glucose, Bld: 88
Potassium: 4.3
Potassium: 4.4
Sodium: 142

## 2011-03-30 LAB — CBC
HCT: 30.2 — ABNORMAL LOW
HCT: 30.3 — ABNORMAL LOW
Hemoglobin: 10.5 — ABNORMAL LOW
MCHC: 34.2
MCHC: 34.9
MCV: 91.6
MCV: 92.5
MCV: 93.1
Platelets: 207
RBC: 3.27 — ABNORMAL LOW
RBC: 3.28 — ABNORMAL LOW
RDW: 14.3
RDW: 14.3
RDW: 14.4
WBC: 7
WBC: 9.1

## 2011-03-30 LAB — POCT I-STAT, CHEM 8
BUN: 36 — ABNORMAL HIGH
Calcium, Ion: 1.16
Chloride: 103
Glucose, Bld: 125 — ABNORMAL HIGH
HCT: 35 — ABNORMAL LOW
Potassium: 4.6

## 2011-03-30 LAB — HEPARIN LEVEL (UNFRACTIONATED)
Heparin Unfractionated: 0.8 — ABNORMAL HIGH
Heparin Unfractionated: <0.1 — ABNORMAL LOW

## 2011-03-30 LAB — POCT CARDIAC MARKERS
CKMB, poc: <1 — ABNORMAL LOW
Myoglobin, poc: 46
Myoglobin, poc: 63.6
Troponin i, poc: <0.05

## 2011-03-30 LAB — CARDIAC PANEL(CRET KIN+CKTOT+MB+TROPI)
CK, MB: 0.6
CK, MB: 0.6
Relative Index: INVALID
Total CK: 32
Total CK: 38
Troponin I: <0.01

## 2011-03-30 LAB — CK TOTAL AND CKMB (NOT AT ARMC)
Relative Index: INVALID
Total CK: 37

## 2011-03-30 LAB — PLATELET COUNT: Platelets: 243

## 2011-04-06 LAB — POCT I-STAT, CHEM 8
BUN: 17 mg/dL (ref 6–23)
Calcium, Ion: 1.1 mmol/L — ABNORMAL LOW (ref 1.12–1.32)
Creatinine, Ser: 1 mg/dL (ref 0.4–1.2)
TCO2: 27 mmol/L (ref 0–100)

## 2011-04-07 ENCOUNTER — Emergency Department (HOSPITAL_COMMUNITY): Payer: BC Managed Care – PPO

## 2011-04-07 ENCOUNTER — Emergency Department (HOSPITAL_COMMUNITY)
Admission: EM | Admit: 2011-04-07 | Discharge: 2011-04-07 | Disposition: A | Discharge status: Home / Self Care | Payer: BC Managed Care – PPO | Attending: Emergency Medicine | Admitting: Emergency Medicine

## 2011-04-07 ENCOUNTER — Encounter (HOSPITAL_COMMUNITY): Payer: Self-pay | Admitting: Radiology

## 2011-04-07 DIAGNOSIS — F430 Acute stress reaction: Secondary | ICD-10-CM | POA: Insufficient documentation

## 2011-04-07 DIAGNOSIS — R0789 Other chest pain: Secondary | ICD-10-CM | POA: Insufficient documentation

## 2011-04-07 DIAGNOSIS — R9389 Abnormal findings on diagnostic imaging of other specified body structures: Secondary | ICD-10-CM | POA: Insufficient documentation

## 2011-04-07 DIAGNOSIS — I252 Old myocardial infarction: Secondary | ICD-10-CM | POA: Insufficient documentation

## 2011-04-07 DIAGNOSIS — Z951 Presence of aortocoronary bypass graft: Secondary | ICD-10-CM | POA: Insufficient documentation

## 2011-04-07 DIAGNOSIS — E785 Hyperlipidemia, unspecified: Secondary | ICD-10-CM | POA: Insufficient documentation

## 2011-04-07 DIAGNOSIS — E119 Type 2 diabetes mellitus without complications: Secondary | ICD-10-CM | POA: Insufficient documentation

## 2011-04-07 DIAGNOSIS — I10 Essential (primary) hypertension: Secondary | ICD-10-CM | POA: Insufficient documentation

## 2011-04-07 DIAGNOSIS — E0500 Thyrotoxicosis with diffuse goiter without thyrotoxic crisis or storm: Secondary | ICD-10-CM | POA: Insufficient documentation

## 2011-04-07 DIAGNOSIS — I2510 Atherosclerotic heart disease of native coronary artery without angina pectoris: Secondary | ICD-10-CM | POA: Insufficient documentation

## 2011-04-07 HISTORY — DX: Essential (primary) hypertension: I10

## 2011-04-07 HISTORY — DX: Atherosclerotic heart disease of native coronary artery without angina pectoris: I25.10

## 2011-04-07 LAB — COMPREHENSIVE METABOLIC PANEL
ALT: 17 U/L (ref 0–35)
AST: 16 U/L (ref 0–37)
Albumin: 4.1 g/dL (ref 3.5–5.2)
Calcium: 9.5 mg/dL (ref 8.4–10.5)
Potassium: 3.4 mEq/L — ABNORMAL LOW (ref 3.5–5.1)
Sodium: 138 mEq/L (ref 135–145)
Total Protein: 7.1 g/dL (ref 6.0–8.3)

## 2011-04-07 LAB — CBC
MCH: 29.3 pg (ref 26.0–34.0)
MCHC: 34.7 g/dL (ref 30.0–36.0)
MCV: 84.5 fL (ref 78.0–100.0)
Platelets: 284 10*3/uL (ref 150–400)
RDW: 13.8 % (ref 11.5–15.5)

## 2011-04-07 LAB — POCT I-STAT TROPONIN I: Troponin i, poc: 0 ng/mL (ref 0.00–0.08)

## 2011-04-07 MED ORDER — IOHEXOL 350 MG/ML SOLN
80.0000 mL | Freq: Once | INTRAVENOUS | Status: AC | PRN
  Administered 2011-04-07: 80 mL via INTRAVENOUS

## 2011-04-10 LAB — BASIC METABOLIC PANEL
BUN: 18
CO2: 32
Calcium: 9.4
Creatinine, Ser: 1.41 — ABNORMAL HIGH
GFR calc Af Amer: 59 — ABNORMAL LOW
GFR calc non Af Amer: 49 — ABNORMAL LOW
Glucose, Bld: 90
Potassium: 4.4
Sodium: 141
Sodium: 141

## 2011-04-10 LAB — POCT CARDIAC MARKERS
CKMB, poc: <1 — ABNORMAL LOW
Myoglobin, poc: 74.4
Operator id: 288331
Troponin i, poc: <0.05

## 2011-04-10 LAB — I-STAT 8, (EC8 V) (CONVERTED LAB)
BUN: 44 — ABNORMAL HIGH
Bicarbonate: 24.8 — ABNORMAL HIGH
Glucose, Bld: 62 — ABNORMAL LOW
Hemoglobin: 12.9
Sodium: 137
pH, Ven: 7.432 — ABNORMAL HIGH

## 2011-04-10 LAB — COMPREHENSIVE METABOLIC PANEL
ALT: 10
AST: 13
Albumin: 3.6
Albumin: 3.6
Alkaline Phosphatase: 86
Calcium: 9.3
Chloride: 96
Creatinine, Ser: 1.16
GFR calc Af Amer: 58 — ABNORMAL LOW
Potassium: 4.2
Sodium: 138
Total Protein: 6.2

## 2011-04-10 LAB — CBC
Hemoglobin: 11.1 — ABNORMAL LOW
Hemoglobin: 12.2
MCHC: 34
MCHC: 34.2
MCHC: 34.8
MCV: 90.3
MCV: 91.3
Platelets: 251
RBC: 3.91
RDW: 13
WBC: 7.2

## 2011-04-10 LAB — CK TOTAL AND CKMB (NOT AT ARMC)
CK, MB: 0.6
CK, MB: 0.7
Relative Index: INVALID
Total CK: 31
Total CK: 35

## 2011-04-10 LAB — DIFFERENTIAL
Lymphocytes Relative: 17
Monocytes Absolute: 0.3
Monocytes Relative: 4
Neutro Abs: 7

## 2011-04-10 LAB — TROPONIN I: Troponin I: 0.02

## 2011-04-10 LAB — TSH: TSH: 0.22 — ABNORMAL LOW

## 2011-04-10 LAB — HEMOGLOBIN A1C
Hgb A1c MFr Bld: 5.9
Mean Plasma Glucose: 133

## 2011-04-13 LAB — COMPREHENSIVE METABOLIC PANEL
ALT: 14
Alkaline Phosphatase: 87
CO2: 28
GFR calc non Af Amer: 37 — ABNORMAL LOW
Glucose, Bld: 120 — ABNORMAL HIGH
Potassium: 4.4
Sodium: 140

## 2011-04-13 LAB — PROTIME-INR: Prothrombin Time: 12.4

## 2011-04-13 LAB — I-STAT 8, (EC8 V) (CONVERTED LAB)
BUN: 36 — ABNORMAL HIGH
Chloride: 102
Hemoglobin: 12.9
Operator id: 146091
Potassium: 4.3
pCO2, Ven: 56.6 — ABNORMAL HIGH

## 2011-04-13 LAB — CBC
Hemoglobin: 12.1
MCHC: 34.1
MCV: 92
RBC: 3.85 — ABNORMAL LOW

## 2011-04-13 LAB — POCT CARDIAC MARKERS
CKMB, poc: <1 — ABNORMAL LOW
Myoglobin, poc: 52
Operator id: 146091

## 2011-04-13 LAB — AMYLASE: Amylase: 101

## 2011-04-13 LAB — DIFFERENTIAL
Basophils Relative: 0
Eosinophils Absolute: 0.1
Monocytes Relative: 2 — ABNORMAL LOW
Neutrophils Relative %: 85 — ABNORMAL HIGH

## 2011-04-13 LAB — CK TOTAL AND CKMB (NOT AT ARMC)
CK, MB: 0.7
Total CK: 24
Total CK: 30

## 2011-04-13 LAB — D-DIMER, QUANTITATIVE: D-Dimer, Quant: <0.22

## 2011-04-13 LAB — LIPASE, BLOOD: Lipase: 44

## 2011-04-13 LAB — MAGNESIUM: Magnesium: 1.5

## 2011-04-18 LAB — BASIC METABOLIC PANEL
BUN: 18
CO2: 32
Calcium: 9.6
Creatinine, Ser: 1.05
Glucose, Bld: 135 — ABNORMAL HIGH

## 2011-04-18 LAB — CBC
MCHC: 33.9
MCHC: 34
Platelets: 251
RDW: 14
RDW: 14.1 — ABNORMAL HIGH

## 2011-04-18 LAB — APTT: aPTT: 29

## 2011-04-18 LAB — PROTIME-INR
INR: 0.9
Prothrombin Time: 12.5

## 2012-01-13 ENCOUNTER — Encounter (HOSPITAL_COMMUNITY): Payer: Self-pay

## 2012-01-13 ENCOUNTER — Observation Stay (HOSPITAL_COMMUNITY)
Admission: EM | Admit: 2012-01-13 | Discharge: 2012-01-14 | Disposition: A | Discharge status: Home / Self Care | Payer: BC Managed Care – PPO | Attending: Emergency Medicine | Admitting: Emergency Medicine

## 2012-01-13 DIAGNOSIS — I2510 Atherosclerotic heart disease of native coronary artery without angina pectoris: Secondary | ICD-10-CM | POA: Insufficient documentation

## 2012-01-13 DIAGNOSIS — E860 Dehydration: Principal | ICD-10-CM | POA: Insufficient documentation

## 2012-01-13 DIAGNOSIS — K529 Noninfective gastroenteritis and colitis, unspecified: Secondary | ICD-10-CM

## 2012-01-13 DIAGNOSIS — E0500 Thyrotoxicosis with diffuse goiter without thyrotoxic crisis or storm: Secondary | ICD-10-CM | POA: Insufficient documentation

## 2012-01-13 DIAGNOSIS — K5289 Other specified noninfective gastroenteritis and colitis: Secondary | ICD-10-CM | POA: Insufficient documentation

## 2012-01-13 DIAGNOSIS — N390 Urinary tract infection, site not specified: Secondary | ICD-10-CM | POA: Insufficient documentation

## 2012-01-13 DIAGNOSIS — E119 Type 2 diabetes mellitus without complications: Secondary | ICD-10-CM | POA: Insufficient documentation

## 2012-01-13 DIAGNOSIS — R5381 Other malaise: Secondary | ICD-10-CM | POA: Insufficient documentation

## 2012-01-13 DIAGNOSIS — R197 Diarrhea, unspecified: Secondary | ICD-10-CM | POA: Insufficient documentation

## 2012-01-13 DIAGNOSIS — I10 Essential (primary) hypertension: Secondary | ICD-10-CM | POA: Insufficient documentation

## 2012-01-13 DIAGNOSIS — I252 Old myocardial infarction: Secondary | ICD-10-CM | POA: Insufficient documentation

## 2012-01-13 DIAGNOSIS — R110 Nausea: Secondary | ICD-10-CM | POA: Insufficient documentation

## 2012-01-13 LAB — URINE MICROSCOPIC-ADD ON

## 2012-01-13 LAB — COMPREHENSIVE METABOLIC PANEL
ALT: 12 U/L (ref 0–35)
AST: 20 U/L (ref 0–37)
Alkaline Phosphatase: 99 U/L (ref 39–117)
CO2: 23 mEq/L (ref 19–32)
GFR calc Af Amer: 39 mL/min — ABNORMAL LOW (ref >90)
Glucose, Bld: 191 mg/dL — ABNORMAL HIGH (ref 70–99)
Potassium: 4.1 mEq/L (ref 3.5–5.1)
Sodium: 135 mEq/L (ref 135–145)
Total Protein: 7.5 g/dL (ref 6.0–8.3)

## 2012-01-13 LAB — CBC WITH DIFFERENTIAL/PLATELET
Eosinophils Absolute: 0.1 10*3/uL (ref 0.0–0.7)
Lymphocytes Relative: 19 % (ref 12–46)
Lymphs Abs: 2.1 10*3/uL (ref 0.7–4.0)
Neutrophils Relative %: 76 % (ref 43–77)
Platelets: 290 10*3/uL (ref 150–400)
RBC: 4.39 MIL/uL (ref 3.87–5.11)
WBC: 11.1 10*3/uL — ABNORMAL HIGH (ref 4.0–10.5)

## 2012-01-13 LAB — URINALYSIS, ROUTINE W REFLEX MICROSCOPIC
Hgb urine dipstick: NEGATIVE
Ketones, ur: 15 mg/dL — AB
Nitrite: POSITIVE — AB
pH: 5 (ref 5.0–8.0)

## 2012-01-13 MED ORDER — SODIUM CHLORIDE 0.9 % IV SOLN
1000.0000 mL | INTRAVENOUS | Status: DC
  Administered 2012-01-14: 1000 mL via INTRAVENOUS

## 2012-01-13 MED ORDER — DIPHENOXYLATE-ATROPINE 2.5-0.025 MG PO TABS: 1.0000 | ORAL_TABLET | Freq: Four times a day (QID) | ORAL | Status: DC | PRN

## 2012-01-13 MED ORDER — SODIUM CHLORIDE 0.9 % IV BOLUS (SEPSIS)
1000.0000 mL | Freq: Once | INTRAVENOUS | Status: AC
  Administered 2012-01-13: 1000 mL via INTRAVENOUS

## 2012-01-13 MED ORDER — ONDANSETRON HCL 4 MG/2ML IJ SOLN
4.0000 mg | INTRAMUSCULAR | Status: DC | PRN
  Administered 2012-01-13: 4 mg via INTRAVENOUS
  Filled 2012-01-13: qty 2

## 2012-01-13 MED ORDER — CIPROFLOXACIN IN D5W 400 MG/200ML IV SOLN
400.0000 mg | Freq: Two times a day (BID) | INTRAVENOUS | Status: DC
  Administered 2012-01-13: 400 mg via INTRAVENOUS
  Filled 2012-01-13: qty 200

## 2012-01-13 NOTE — ED Provider Notes (Cosign Needed)
History     CSN: [id number]  Arrival date & time 01/13/12  1515   First MD Initiated Contact with Patient 01/13/12 1833      Chief Complaint  Patient presents with  . Diarrhea  . Nausea    (Consider location/radiation/quality/duration/timing/severity/associated sxs/prior treatment) HPI Comments: [name] is a 61 y.o. Female who complains of diarrhea for 3 days. Today she had only 2 stools. Stool is brown in color, and liquid consistency. She initially had vomiting, but has not vomited in 36 hours. She has persistent nausea. She denies fever. No known sick contacts or suspected food toxins. She has mild, weakness, but no presyncope. She has taken her usual medications. She feels better when resting. She cannot tolerate liquids or a full diet. She denies dysuria, urinary frequency, or hematuria.  The history is provided by the patient and a relative.    Past Medical History  Diagnosis Date  . Hypertension   . Diabetes mellitus   . CAD (coronary artery disease)   . MI (myocardial infarction)   . Grave's disease     No past surgical history on file.  No family history on file.  History  Substance Use Topics  . Smoking status: Never Smoker   . Smokeless tobacco: Not on file  . Alcohol Use: No    OB History    Grav Para Term Preterm Abortions TAB SAB Ect Mult Living                  Review of Systems  All other systems reviewed and are negative.    Allergies  Review of patient's allergies indicates no known allergies.  Home Medications   Current Outpatient Rx  Name Route Sig Dispense Refill  . ASPIRIN 325 MG PO TABS Oral Take 325 mg by mouth daily.    . ATORVASTATIN CALCIUM 80 MG PO TABS Oral Take 80 mg by mouth every evening.    Marland Kitchen HYDROCHLOROTHIAZIDE 25 MG PO TABS Oral Take 25 mg by mouth daily.    Marland Kitchen VICTOZA [place] Subcutaneous Inject 1.2 mg into the skin daily.    Marland Kitchen LISINOPRIL 5 MG PO TABS Oral Take 5 mg by mouth daily.    Marland Kitchen METFORMIN HCL 1000 MG PO TABS  Oral Take 1,000 mg by mouth 2 (two) times daily with a meal.    . METOPROLOL TARTRATE 25 MG PO TABS Oral Take 25 mg by mouth 2 (two) times daily.    Marland Kitchen RANOLAZINE ER 500 MG PO TB12 Oral Take 1,000 mg by mouth 2 (two) times daily.    Marland Kitchen CIPROFLOXACIN HCL 500 MG PO TABS Oral Take 1 tablet (500 mg total) by mouth every 12 (twelve) hours. 10 tablet 0  . ONDANSETRON HCL 4 MG PO TABS Oral Take 1 tablet (4 mg total) by mouth every 6 (six) hours. 12 tablet 0    BP 118/72  Pulse 76  Temp 97.8 F (36.6 C) (Oral)  Resp 20  SpO2 100%  Physical Exam  Nursing note and vitals reviewed. Constitutional: She is oriented to person, place, and time. She appears well-developed and well-nourished.  HENT:  Head: Normocephalic and atraumatic.       Mucous membranes are moist  Eyes: Conjunctivae and EOM are normal. Pupils are equal, round, and reactive to light.  Neck: Normal range of motion and phonation normal. Neck supple.  Cardiovascular: Normal rate, regular rhythm and intact distal pulses.   Pulmonary/Chest: Effort normal and breath sounds normal. She exhibits no  tenderness.  Abdominal: Soft. She exhibits no distension. There is no tenderness. There is no guarding.  Musculoskeletal: Normal range of motion.  Neurological: She is alert and oriented to person, place, and time. She has normal strength. She exhibits normal muscle tone.  Skin: Skin is warm and dry.  Psychiatric: She has a normal mood and affect. Her behavior is normal. Judgment and thought content normal.    ED Course  Procedures (including critical care time)  Emergency department treatment. IV fluid bolus x2 L. IV, maintenance fluid. IV, Cipro for possible UTI. Urine culture sent  Patient sent to CDU for completion of treatment. She is on CDU dehydration protocol.  Wt is 158 lbs., much higher than baseline   Labs Reviewed  CBC WITH DIFFERENTIAL - Abnormal; Notable for the following:    WBC 11.1 (*)     Neutro Abs 8.4 (*)     All  other components within normal limits  COMPREHENSIVE METABOLIC PANEL - Abnormal; Notable for the following:    Chloride 94 (*)     Glucose, Bld 191 (*)     BUN 41 (*)     Creatinine, Ser 1.61 (*)     GFR calc non Af Amer 33 (*)     GFR calc Af Amer 39 (*)     All other components within normal limits  URINALYSIS, ROUTINE W REFLEX MICROSCOPIC - Abnormal; Notable for the following:    Color, Urine RED (*)  BIOCHEMICALS MAY BE AFFECTED BY COLOR   APPearance CLOUDY (*)     Bilirubin Urine MODERATE (*)     Ketones, ur 15 (*)     Protein, ur 30 (*)     Nitrite POSITIVE (*)     Leukocytes, UA MODERATE (*)     All other components within normal limits  URINE MICROSCOPIC-ADD ON - Abnormal; Notable for the following:    Squamous Epithelial / LPF MANY (*)     Bacteria, UA MANY (*)     All other components within normal limits  BASIC METABOLIC PANEL - Abnormal; Notable for the following:    Glucose, Bld 124 (*)     BUN 30 (*)     Creatinine, Ser 1.19 (*)     GFR calc non Af Amer 48 (*)     GFR calc Af Amer 56 (*)     All other components within normal limits  URINE CULTURE   No results found.   1. UTI (lower urinary tract infection)   2. Gastroenteritis   3. Dehydration       MDM        Gastroenteritis, improving with persistent diarrhea. Mild volume depletion with elevated BUN, and creatinine.; Consistent with prerenal state. Incidental urinary tract infection, be treated. Patient is describing fluids in the emergency department prior to discharge.   Care to oncoming ED providers.       Flint Melter, MD 01/14/12 [phone]

## 2012-01-13 NOTE — ED Notes (Signed)
Diarrhea and nausea, x 1 week, worse since Wednesday.

## 2012-01-14 LAB — BASIC METABOLIC PANEL
BUN: 30 mg/dL — ABNORMAL HIGH (ref 6–23)
CO2: 25 mEq/L (ref 19–32)
Chloride: 105 mEq/L (ref 96–112)
Creatinine, Ser: 1.19 mg/dL — ABNORMAL HIGH (ref 0.50–1.10)
Glucose, Bld: 124 mg/dL — ABNORMAL HIGH (ref 70–99)
Potassium: 3.7 mEq/L (ref 3.5–5.1)

## 2012-01-14 MED ORDER — ONDANSETRON HCL 4 MG PO TABS: 4.0000 mg | ORAL_TABLET | Freq: Four times a day (QID) | ORAL | Status: AC

## 2012-01-14 MED ORDER — CIPROFLOXACIN HCL 500 MG PO TABS
500.0000 mg | ORAL_TABLET | Freq: Once | ORAL | Status: AC
  Administered 2012-01-14: 500 mg via ORAL
  Filled 2012-01-14: qty 1

## 2012-01-14 MED ORDER — CIPROFLOXACIN HCL 500 MG PO TABS: 500.0000 mg | ORAL_TABLET | Freq: Two times a day (BID) | ORAL | Status: AC

## 2012-01-14 MED ORDER — ONDANSETRON 4 MG PO TBDP
8.0000 mg | ORAL_TABLET | Freq: Once | ORAL | Status: AC
  Administered 2012-01-14: 8 mg via ORAL
  Filled 2012-01-14: qty 2

## 2012-01-14 NOTE — ED Notes (Signed)
Pt family coming around 9 am to take her home.

## 2012-01-14 NOTE — ED Provider Notes (Signed)
0700 Report received.  Patient feels better.  Slept all night .  No more diarrhea.  Will give po cipro this am for UTI.  Eat breakfast.  Daughter will pick her up in 1 hours.  Zofran for nausea. 0900 patient's daughter is at the bedside. Patient states that she feels better and wants to go home. Patient will be dispositioned home with followup this week with Dr. Merri Brunette. Diarrhea has resolved give her a prescription for Imodium and Zofran for nausea and Cipro for her UTI.  Remi Haggard, NP 01/14/12 [phone]

## 2012-01-14 NOTE — ED Provider Notes (Signed)
Medical screening examination/treatment/procedure(s) were performed by non-physician practitioner and as supervising physician I was immediately available for consultation/collaboration.  Jasmine Awe, MD 01/14/12 2342

## 2012-01-15 LAB — URINE CULTURE

## 2012-01-16 NOTE — ED Notes (Signed)
+   urine Patient treated with Cipro-sensitive to same-chart appended per protocol MD. 

## 2012-01-23 NOTE — Progress Notes (Signed)
Observation review is complete for 01/13/2012 visit.

## 2012-04-17 ENCOUNTER — Other Ambulatory Visit: Payer: Self-pay | Admitting: Gastroenterology

## 2012-04-17 DIAGNOSIS — R110 Nausea: Secondary | ICD-10-CM

## 2012-04-18 ENCOUNTER — Other Ambulatory Visit: Payer: BC Managed Care – PPO

## 2012-04-18 ENCOUNTER — Ambulatory Visit
Admission: RE | Admit: 2012-04-18 | Discharge: 2012-04-18 | Disposition: A | Discharge status: Home / Self Care | Payer: BC Managed Care – PPO | Source: Ambulatory Visit | Attending: Gastroenterology | Admitting: Gastroenterology

## 2012-04-18 DIAGNOSIS — R110 Nausea: Secondary | ICD-10-CM

## 2012-04-18 MED ORDER — IOHEXOL 300 MG/ML  SOLN
100.0000 mL | Freq: Once | INTRAMUSCULAR | Status: AC | PRN
  Administered 2012-04-18: 100 mL via INTRAVENOUS

## 2012-06-13 ENCOUNTER — Emergency Department (HOSPITAL_COMMUNITY)
Admission: EM | Admit: 2012-06-13 | Discharge: 2012-06-14 | Disposition: A | Discharge status: Home / Self Care | Payer: BC Managed Care – PPO | Attending: Emergency Medicine | Admitting: Emergency Medicine

## 2012-06-13 ENCOUNTER — Encounter (HOSPITAL_COMMUNITY): Payer: Self-pay | Admitting: *Deleted

## 2012-06-13 ENCOUNTER — Emergency Department (HOSPITAL_COMMUNITY): Payer: BC Managed Care – PPO

## 2012-06-13 DIAGNOSIS — I2510 Atherosclerotic heart disease of native coronary artery without angina pectoris: Secondary | ICD-10-CM | POA: Insufficient documentation

## 2012-06-13 DIAGNOSIS — S8001XA Contusion of right knee, initial encounter: Secondary | ICD-10-CM

## 2012-06-13 DIAGNOSIS — E119 Type 2 diabetes mellitus without complications: Secondary | ICD-10-CM | POA: Insufficient documentation

## 2012-06-13 DIAGNOSIS — W19XXXA Unspecified fall, initial encounter: Secondary | ICD-10-CM

## 2012-06-13 DIAGNOSIS — Z8639 Personal history of other endocrine, nutritional and metabolic disease: Secondary | ICD-10-CM | POA: Insufficient documentation

## 2012-06-13 DIAGNOSIS — S8000XA Contusion of unspecified knee, initial encounter: Secondary | ICD-10-CM | POA: Insufficient documentation

## 2012-06-13 DIAGNOSIS — Y929 Unspecified place or not applicable: Secondary | ICD-10-CM | POA: Insufficient documentation

## 2012-06-13 DIAGNOSIS — R51 Headache: Secondary | ICD-10-CM | POA: Insufficient documentation

## 2012-06-13 DIAGNOSIS — W010XXA Fall on same level from slipping, tripping and stumbling without subsequent striking against object, initial encounter: Secondary | ICD-10-CM | POA: Insufficient documentation

## 2012-06-13 DIAGNOSIS — Y9301 Activity, walking, marching and hiking: Secondary | ICD-10-CM | POA: Insufficient documentation

## 2012-06-13 DIAGNOSIS — S5000XA Contusion of unspecified elbow, initial encounter: Secondary | ICD-10-CM | POA: Insufficient documentation

## 2012-06-13 DIAGNOSIS — S5001XA Contusion of right elbow, initial encounter: Secondary | ICD-10-CM

## 2012-06-13 DIAGNOSIS — I252 Old myocardial infarction: Secondary | ICD-10-CM | POA: Insufficient documentation

## 2012-06-13 DIAGNOSIS — Z862 Personal history of diseases of the blood and blood-forming organs and certain disorders involving the immune mechanism: Secondary | ICD-10-CM | POA: Insufficient documentation

## 2012-06-13 DIAGNOSIS — Z79899 Other long term (current) drug therapy: Secondary | ICD-10-CM | POA: Insufficient documentation

## 2012-06-13 DIAGNOSIS — I10 Essential (primary) hypertension: Secondary | ICD-10-CM | POA: Insufficient documentation

## 2012-06-13 DIAGNOSIS — Z7982 Long term (current) use of aspirin: Secondary | ICD-10-CM | POA: Insufficient documentation

## 2012-06-13 MED ORDER — ACETAMINOPHEN 500 MG PO TABS
1000.0000 mg | ORAL_TABLET | Freq: Once | ORAL | Status: DC
  Filled 2012-06-13: qty 2

## 2012-06-13 NOTE — ED Notes (Addendum)
Ortho paged.  Wound cleansed with ns and bacitracin placed.

## 2012-06-13 NOTE — ED Notes (Signed)
Pt reports when she fell she did hit her head, but did not lose LOC.  Pt reports headache right now.  No s/s of N/V.

## 2012-06-13 NOTE — ED Notes (Signed)
The pt tripped and fell while coming around her bed approx one hour ago.  She has  Rt knee pain with rt elbow.  No loc

## 2012-06-13 NOTE — ED Provider Notes (Signed)
History     CSN: [id number]  Arrival date & time 06/13/12  2041   First MD Initiated Contact with Patient 06/13/12 2123      Chief Complaint  Patient presents with  . Fall    (Consider location/radiation/quality/duration/timing/severity/associated sxs/prior treatment) HPI History provided by pt.   Pt tripped while walking to the bathroom this evening, fell forward, and landed on right knee/elbow and right side of head.  C/o right-sided headache, but denies LOC, dizziness blurred vision and N/V.   Denies neck/back pain.  Has abrasions and pain of right knee and elbow.  Pain aggravated by ROM and knee by bearing weight.  No associated paresthesias.  Wounds have not been cleaned.  Tetanus up to date.  Pt is not anti-coagulated.  Recent illnesses include one week of non-productive cough.  Denies fever, SOB, CP, vomiting, diarrhea and urinary sx.  Past Medical History  Diagnosis Date  . Hypertension   . Diabetes mellitus   . CAD (coronary artery disease)   . MI (myocardial infarction)   . Grave's disease     History reviewed. No pertinent past surgical history.  No family history on file.  History  Substance Use Topics  . Smoking status: Never Smoker   . Smokeless tobacco: Not on file  . Alcohol Use: No    OB History    Grav Para Term Preterm Abortions TAB SAB Ect Mult Living                  Review of Systems  All other systems reviewed and are negative.    Allergies  Review of patient's allergies indicates no known allergies.  Home Medications   Current Outpatient Rx  Name  Route  Sig  Dispense  Refill  . ASPIRIN 325 MG PO TABS   Oral   Take 325 mg by mouth daily.         . ATORVASTATIN CALCIUM 80 MG PO TABS   Oral   Take 80 mg by mouth every evening.         Marland Kitchen HYDROCHLOROTHIAZIDE 25 MG PO TABS   Oral   Take 25 mg by mouth daily.         Marland Kitchen VICTOZA [place]   Subcutaneous   Inject 1.2 mg into the skin daily.         Marland Kitchen LISINOPRIL 5 MG PO TABS  Oral   Take 5 mg by mouth daily.         Marland Kitchen METFORMIN HCL 1000 MG PO TABS   Oral   Take 1,000 mg by mouth 2 (two) times daily with a meal.         . METOPROLOL TARTRATE 25 MG PO TABS   Oral   Take 25 mg by mouth 2 (two) times daily.         Marland Kitchen RANOLAZINE ER 500 MG PO TB12   Oral   Take 1,000 mg by mouth 2 (two) times daily.           BP 124/55  Pulse 80  Temp 97.9 F (36.6 C) (Oral)  Resp 20  SpO2 99%  Physical Exam  Nursing note and vitals reviewed. Constitutional: She is oriented to person, place, and time. She appears well-developed and well-nourished. No distress.  HENT:  Head: Normocephalic and atraumatic.  Mouth/Throat: Oropharynx is clear and moist.       No hematoma.  Mild tenderness R parietal scalp  Eyes:       Normal appearance  Neck: Normal range of motion.  Cardiovascular: Normal rate and regular rhythm.   Pulmonary/Chest: Effort normal and breath sounds normal. No respiratory distress.  Musculoskeletal: Normal range of motion.       Entire spine non-tender.  Superficial abrasion posterior right elbow.  No significant tenderness and pt denies pain w/ passive ROM.  Nml shoulder and wrist.  Right knee w/ hemostatic and clean superficial abrasion.  No deformity, edema or ecchymosis.  Tenderness at and medial to patella.  Pain w/ passive flexion.  2+ radial and DP pulses and distal sensation intact.    Neurological: She is alert and oriented to person, place, and time.       CN 3-12 intact.  No sensory deficits.  5/5 and equal upper and lower extremity strength.  No past pointing.   Skin: Skin is warm and dry. No rash noted.  Psychiatric: She has a normal mood and affect. Her behavior is normal.    ED Course  Procedures (including critical care time)  Labs Reviewed - No data to display Dg Knee Complete 4 Views Right  06/13/2012  *RADIOLOGY REPORT*  Clinical Data: Right anterior knee pain and swelling after fall today.  RIGHT KNEE - COMPLETE 4+ VIEW   Comparison: 06/05/2008  Findings: Mild degenerative changes with hypertrophic changes in the patellofemoral joint and mild medial compartment narrowing.  No evidence of acute fracture or subluxation.  No focal bone lesion or bone destruction.  Bone cortex and trabecular architecture appear intact.  No significant effusion.  Vascular calcifications and surgical clips in the soft tissues.  No significant change since previous study.  IMPRESSION: Mild degenerative changes.  No acute bony abnormalities.   Original Report Authenticated By: Burman Nieves, M.D.      1. Fall   2. Contusion of right knee   3. Contusion of right elbow       MDM  61yo F presents w/ mechanical fall w/ head impact and abrasions/pain R elbow and knee.  Doubt TBI; mild headache, no scalp hematoma, no focal neuro deficits.  No tenderness of spine.  Doubt elbow fx based on exam.  Fx of knee unlikely based on exam but will xray d/t pain w/ bearing weight and limited ROM.  Nursing staff has cleaned wounds and administered tylenol for pain.  Tetanus is up to date.  10:29 PM     Xray neg.  Results discussed w/ pt.  Ortho tech placed in knee sleeve for comfort.  I recommended NSAID and RICE.  Return precautions, particularly worsening headache discussed.        Otilio Miu, PA-C 06/13/12 2331  Otilio Miu, PA-C 06/13/12 2332  Otilio Miu, PA-C 06/14/12 0028

## 2012-06-13 NOTE — ED Notes (Signed)
Patient transported to X-ray 

## 2012-06-13 NOTE — Progress Notes (Signed)
Orthopedic Tech Progress Note Patient Details:  [name] 24-Jan-1951 [id number]  Ortho Devices Type of Ortho Device: Knee Sleeve   Haskell Flirt 06/13/2012, 11:47 PM

## 2012-06-14 NOTE — ED Provider Notes (Signed)
Medical screening examination/treatment/procedure(s) were performed by non-physician practitioner and as supervising physician I was immediately available for consultation/collaboration.   Charles B. Sheldon, MD 06/14/12 0058 

## 2013-06-19 ENCOUNTER — Encounter: Payer: Self-pay | Admitting: Pharmacist

## 2013-07-14 ENCOUNTER — Emergency Department (HOSPITAL_COMMUNITY): Payer: PRIVATE HEALTH INSURANCE

## 2013-07-14 ENCOUNTER — Encounter (HOSPITAL_COMMUNITY): Payer: Self-pay | Admitting: Emergency Medicine

## 2013-07-14 ENCOUNTER — Inpatient Hospital Stay (HOSPITAL_COMMUNITY)
Admission: EM | Admit: 2013-07-14 | Discharge: 2013-07-15 | DRG: 287 | Disposition: A | Discharge status: Home / Self Care | Payer: PRIVATE HEALTH INSURANCE | Attending: Cardiology | Admitting: Cardiology

## 2013-07-14 DIAGNOSIS — Y832 Surgical operation with anastomosis, bypass or graft as the cause of abnormal reaction of the patient, or of later complication, without mention of misadventure at the time of the procedure: Secondary | ICD-10-CM | POA: Diagnosis present

## 2013-07-14 DIAGNOSIS — Z7982 Long term (current) use of aspirin: Secondary | ICD-10-CM

## 2013-07-14 DIAGNOSIS — Z9861 Coronary angioplasty status: Secondary | ICD-10-CM

## 2013-07-14 DIAGNOSIS — I200 Unstable angina: Secondary | ICD-10-CM | POA: Diagnosis present

## 2013-07-14 DIAGNOSIS — K219 Gastro-esophageal reflux disease without esophagitis: Secondary | ICD-10-CM

## 2013-07-14 DIAGNOSIS — I2510 Atherosclerotic heart disease of native coronary artery without angina pectoris: Secondary | ICD-10-CM

## 2013-07-14 DIAGNOSIS — R079 Chest pain, unspecified: Secondary | ICD-10-CM

## 2013-07-14 DIAGNOSIS — E0500 Thyrotoxicosis with diffuse goiter without thyrotoxic crisis or storm: Secondary | ICD-10-CM | POA: Diagnosis present

## 2013-07-14 DIAGNOSIS — D649 Anemia, unspecified: Secondary | ICD-10-CM | POA: Diagnosis present

## 2013-07-14 DIAGNOSIS — T82897A Other specified complication of cardiac prosthetic devices, implants and grafts, initial encounter: Principal | ICD-10-CM | POA: Diagnosis present

## 2013-07-14 DIAGNOSIS — I10 Essential (primary) hypertension: Secondary | ICD-10-CM

## 2013-07-14 DIAGNOSIS — Z951 Presence of aortocoronary bypass graft: Secondary | ICD-10-CM

## 2013-07-14 DIAGNOSIS — I252 Old myocardial infarction: Secondary | ICD-10-CM

## 2013-07-14 DIAGNOSIS — E785 Hyperlipidemia, unspecified: Secondary | ICD-10-CM

## 2013-07-14 DIAGNOSIS — E669 Obesity, unspecified: Secondary | ICD-10-CM

## 2013-07-14 DIAGNOSIS — E119 Type 2 diabetes mellitus without complications: Secondary | ICD-10-CM | POA: Diagnosis present

## 2013-07-14 HISTORY — DX: Depression, unspecified: F32.A

## 2013-07-14 HISTORY — DX: Major depressive disorder, single episode, unspecified: F32.9

## 2013-07-14 HISTORY — DX: Type 2 diabetes mellitus without complications: E11.9

## 2013-07-14 HISTORY — DX: Headache: R51

## 2013-07-14 HISTORY — DX: Migraine, unspecified, not intractable, without status migrainosus: G43.909

## 2013-07-14 HISTORY — DX: Acute myocardial infarction, unspecified: I21.9

## 2013-07-14 HISTORY — DX: Anemia, unspecified: D64.9

## 2013-07-14 HISTORY — DX: Gastro-esophageal reflux disease without esophagitis: K21.9

## 2013-07-14 HISTORY — DX: Unspecified osteoarthritis, unspecified site: M19.90

## 2013-07-14 HISTORY — DX: Obesity, unspecified: E66.9

## 2013-07-14 LAB — COMPREHENSIVE METABOLIC PANEL
ALBUMIN: 3.7 g/dL (ref 3.5–5.2)
ALT: 12 U/L (ref 0–35)
ALT: 12 U/L (ref 0–35)
AST: 17 U/L (ref 0–37)
AST: 13 U/L (ref 0–37)
Albumin: 3.5 g/dL (ref 3.5–5.2)
Alkaline Phosphatase: 123 U/L — ABNORMAL HIGH (ref 39–117)
Alkaline Phosphatase: 115 U/L (ref 39–117)
BUN: 19 mg/dL (ref 6–23)
BUN: 18 mg/dL (ref 6–23)
CALCIUM: 8.7 mg/dL (ref 8.4–10.5)
CHLORIDE: 100 meq/L (ref 96–112)
CO2: 26 meq/L (ref 19–32)
CO2: 27 mEq/L (ref 19–32)
CREATININE: 0.87 mg/dL (ref 0.50–1.10)
Calcium: 8.6 mg/dL (ref 8.4–10.5)
Chloride: 101 mEq/L (ref 96–112)
Creatinine, Ser: 0.95 mg/dL (ref 0.50–1.10)
GFR calc Af Amer: 80 mL/min — ABNORMAL LOW (ref >90)
GFR calc non Af Amer: 62 mL/min — ABNORMAL LOW (ref >90)
GFR, EST AFRICAN AMERICAN: 72 mL/min — AB (ref >90)
GFR, EST NON AFRICAN AMERICAN: 69 mL/min — AB (ref >90)
Glucose, Bld: 93 mg/dL (ref 70–99)
Glucose, Bld: 100 mg/dL — ABNORMAL HIGH (ref 70–99)
Potassium: 4.9 mEq/L (ref 3.7–5.3)
Potassium: 4.8 mEq/L (ref 3.7–5.3)
SODIUM: 139 meq/L (ref 137–147)
Sodium: 138 mEq/L (ref 137–147)
TOTAL PROTEIN: 6.3 g/dL (ref 6.0–8.3)
Total Bilirubin: <0.2 mg/dL — ABNORMAL LOW (ref 0.3–1.2)
Total Protein: 6.7 g/dL (ref 6.0–8.3)

## 2013-07-14 LAB — CBC WITH DIFFERENTIAL/PLATELET
BASOS ABS: 0 10*3/uL (ref 0.0–0.1)
Basophils Relative: 0 % (ref 0–1)
Eosinophils Absolute: 0.2 10*3/uL (ref 0.0–0.7)
Eosinophils Relative: 2 % (ref 0–5)
HEMATOCRIT: 32.8 % — AB (ref 36.0–46.0)
Hemoglobin: 10.9 g/dL — ABNORMAL LOW (ref 12.0–15.0)
LYMPHS ABS: 2.9 10*3/uL (ref 0.7–4.0)
Lymphocytes Relative: 30 % (ref 12–46)
MCH: 29.8 pg (ref 26.0–34.0)
MCHC: 33.2 g/dL (ref 30.0–36.0)
MCV: 89.6 fL (ref 78.0–100.0)
MONO ABS: 0.5 10*3/uL (ref 0.1–1.0)
Monocytes Relative: 5 % (ref 3–12)
NEUTROS ABS: 6.2 10*3/uL (ref 1.7–7.7)
Neutrophils Relative %: 63 % (ref 43–77)
PLATELETS: 263 10*3/uL (ref 150–400)
RBC: 3.66 MIL/uL — ABNORMAL LOW (ref 3.87–5.11)
RDW: 14 % (ref 11.5–15.5)
WBC: 9.8 10*3/uL (ref 4.0–10.5)

## 2013-07-14 LAB — POCT I-STAT TROPONIN I: TROPONIN I, POC: 0 ng/mL (ref 0.00–0.08)

## 2013-07-14 LAB — TROPONIN I

## 2013-07-14 MED ORDER — HEPARIN (PORCINE) IN NACL 100-0.45 UNIT/ML-% IJ SOLN
1000.0000 [IU]/h | INTRAMUSCULAR | Status: DC
  Administered 2013-07-14: 1000 [IU]/h via INTRAVENOUS
  Filled 2013-07-14 (×2): qty 250

## 2013-07-14 MED ORDER — SODIUM CHLORIDE 0.9 % IV SOLN: 250.0000 mL | INTRAVENOUS | Status: DC | PRN

## 2013-07-14 MED ORDER — HYDROCHLOROTHIAZIDE 25 MG PO TABS
25.0000 mg | ORAL_TABLET | Freq: Every day | ORAL | Status: DC
  Administered 2013-07-15: 25 mg via ORAL
  Filled 2013-07-14: qty 1

## 2013-07-14 MED ORDER — NITROGLYCERIN 0.4 MG SL SUBL: 0.4000 mg | SUBLINGUAL_TABLET | SUBLINGUAL | Status: DC | PRN

## 2013-07-14 MED ORDER — SODIUM CHLORIDE 0.9 % IJ SOLN: 3.0000 mL | INTRAMUSCULAR | Status: DC | PRN

## 2013-07-14 MED ORDER — PANTOPRAZOLE SODIUM 40 MG PO TBEC
40.0000 mg | DELAYED_RELEASE_TABLET | Freq: Every day | ORAL | Status: DC
  Administered 2013-07-14 – 2013-07-15 (×2): 40 mg via ORAL
  Filled 2013-07-14 (×2): qty 1

## 2013-07-14 MED ORDER — STUDY - INVESTIGATIONAL MEDICATION
1.0000 | Freq: Every day | Status: DC
  Filled 2013-07-14: qty 1

## 2013-07-14 MED ORDER — SODIUM CHLORIDE 0.9 % IJ SOLN: 3.0000 mL | Freq: Two times a day (BID) | INTRAMUSCULAR | Status: DC

## 2013-07-14 MED ORDER — HEPARIN BOLUS VIA INFUSION
4000.0000 [IU] | Freq: Once | INTRAVENOUS | Status: AC
  Administered 2013-07-14: 4000 [IU] via INTRAVENOUS
  Filled 2013-07-14: qty 4000

## 2013-07-14 MED ORDER — METOPROLOL TARTRATE 25 MG PO TABS
25.0000 mg | ORAL_TABLET | Freq: Two times a day (BID) | ORAL | Status: DC
  Administered 2013-07-14 – 2013-07-15 (×2): 25 mg via ORAL
  Filled 2013-07-14 (×3): qty 1

## 2013-07-14 MED ORDER — LISINOPRIL 5 MG PO TABS
5.0000 mg | ORAL_TABLET | Freq: Every day | ORAL | Status: DC
  Administered 2013-07-15: 5 mg via ORAL
  Filled 2013-07-14: qty 1

## 2013-07-14 MED ORDER — ACETAMINOPHEN 325 MG PO TABS: 650.0000 mg | ORAL_TABLET | ORAL | Status: DC | PRN

## 2013-07-14 MED ORDER — ESCITALOPRAM OXALATE 5 MG PO TABS
5.0000 mg | ORAL_TABLET | Freq: Every day | ORAL | Status: DC
  Administered 2013-07-15: 5 mg via ORAL
  Filled 2013-07-14: qty 1

## 2013-07-14 MED ORDER — ATORVASTATIN CALCIUM 80 MG PO TABS
80.0000 mg | ORAL_TABLET | Freq: Every evening | ORAL | Status: DC
  Administered 2013-07-14 – 2013-07-15 (×2): 80 mg via ORAL
  Filled 2013-07-14 (×2): qty 1

## 2013-07-14 MED ORDER — ONDANSETRON HCL 4 MG PO TABS: 4.0000 mg | ORAL_TABLET | Freq: Three times a day (TID) | ORAL | Status: DC | PRN

## 2013-07-14 MED ORDER — ASPIRIN 325 MG PO TABS
325.0000 mg | ORAL_TABLET | Freq: Every day | ORAL | Status: DC
  Filled 2013-07-14: qty 1

## 2013-07-14 MED ORDER — ONDANSETRON HCL 4 MG/2ML IJ SOLN: 4.0000 mg | Freq: Four times a day (QID) | INTRAMUSCULAR | Status: DC | PRN

## 2013-07-14 NOTE — Progress Notes (Signed)
ANTICOAGULATION CONSULT NOTE - Initial Consult  Pharmacy Consult for heparin Indication: chest pain/ACS  No Known Allergies  Patient Measurements: Height: 4\' 11"  (149.9 cm) Weight: 197 lb 7 oz (89.557 kg) IBW/kg (Calculated) : 43.2 Heparin Dosing Weight: 67 kg  Vital Signs: Temp: 97.8 F (36.6 C) (01/13 1825) Temp src: Oral (01/13 1825) BP: 119/56 mmHg (01/13 2100) Pulse Rate: 74 (01/13 2100)  Labs:  Recent Labs  07/14/13 1841  HGB 10.9*  HCT 32.8*  PLT 263  CREATININE 0.87    Estimated Creatinine Clearance: 64.6 ml/min (by C-G formula based on Cr of 0.87).   Medical History: Past Medical History  Diagnosis Date  . Hypertension   . Diabetes mellitus   . CAD (coronary artery disease)   . MI (myocardial infarction)   . Grave's disease     Medications:  See medication history  Assessment: 63 year old woman with complaints of chest pain to be admitted for ACS workup. Goal of Therapy:  Heparin level 0.3-0.7 units/ml Monitor platelets by anticoagulation protocol: Yes   Plan:  Give 4000 units bolus x 1 Start heparin infusion at 1000 units/hr Check anti-Xa level in 6 hours and daily while on heparin Continue to monitor H&H and platelets  Candie Mile 07/14/2013,9:16 PM

## 2013-07-14 NOTE — ED Notes (Signed)
PA at bedside.

## 2013-07-14 NOTE — ED Provider Notes (Signed)
CSN: [id number]     Arrival date & time 07/14/13  1818 History   First MD Initiated Contact with Patient 07/14/13 1853     Chief Complaint  Patient presents with  . Chest Pain   (Consider location/radiation/quality/duration/timing/severity/associated sxs/prior Treatment) Patient is a 63 y.o. female presenting with chest pain. The history is provided by the patient and the spouse. No language interpreter was used.  Chest Pain Pain location:  L chest Pain radiates to:  L arm Pain radiates to the back: yes   Pain severity:  Mild Onset quality:  Gradual Duration:  2 days Timing:  Intermittent Associated symptoms: nausea and shortness of breath   Associated symptoms: no abdominal pain, no cough and no fever   Associated symptoms comment:  Pain that started in her upper back, radiating forward to left chest and affecting left upper arm, similar in pattern to previous MI's. She has a history of HTN, DM, CAD, and is followed for cardiac disease by Dr. Fransico Him. Her symptoms started 2 days ago. She does have NTG at home and took two yesterday with partial relief. She has not taken any today.    Past Medical History  Diagnosis Date  . Hypertension   . Diabetes mellitus   . CAD (coronary artery disease)   . MI (myocardial infarction)   . Grave's disease    Past Surgical History  Procedure Laterality Date  . Coronary angioplasty with stent placement     No family history on file. History  Substance Use Topics  . Smoking status: Never Smoker   . Smokeless tobacco: Not on file  . Alcohol Use: No   OB History   Grav Para Term Preterm Abortions TAB SAB Ect Mult Living                 Review of Systems  Constitutional: Negative for fever and chills.  HENT: Negative.   Respiratory: Positive for shortness of breath. Negative for cough.   Cardiovascular: Positive for chest pain.  Gastrointestinal: Positive for nausea. Negative for abdominal pain.  Genitourinary: Negative.    Musculoskeletal: Negative.   Skin: Negative.   Neurological: Negative.   Psychiatric/Behavioral: Negative for confusion.    Allergies  Review of patient's allergies indicates no known allergies.  Home Medications   Current Outpatient Rx  Name  Route  Sig  Dispense  Refill  . aspirin 325 MG tablet   Oral   Take 325 mg by mouth daily.         Marland Kitchen atorvastatin (LIPITOR) 80 MG tablet   Oral   Take 80 mg by mouth every evening.         . hydrochlorothiazide (HYDRODIURIL) 25 MG tablet   Oral   Take 25 mg by mouth daily.         . Liraglutide (VICTOZA [place])   Subcutaneous   Inject 1.2 mg into the skin daily.         Marland Kitchen lisinopril (PRINIVIL,ZESTRIL) 5 MG tablet   Oral   Take 5 mg by mouth daily.         . metFORMIN (GLUCOPHAGE) 1000 MG tablet   Oral   Take 1,000 mg by mouth 2 (two) times daily with a meal.         . metoprolol tartrate (LOPRESSOR) 25 MG tablet   Oral   Take 25 mg by mouth 2 (two) times daily.         . ranolazine (RANEXA) 500 MG 12 hr tablet  Oral   Take 1,000 mg by mouth 2 (two) times daily.         . STUDY MEDICATION      Evacetrapib (CETP inhibitor) lipid lowering study with [facility]          BP 107/64  Pulse 76  Temp(Src) 97.8 F (36.6 C) (Oral)  Resp 20  Ht 4\' 11"  (1.499 m)  Wt 197 lb 7 oz (89.557 kg)  BMI 39.86 kg/m2  SpO2 98% Physical Exam  Constitutional: She is oriented to person, place, and time. She appears well-developed and well-nourished.  HENT:  Head: Normocephalic.  Neck: Normal range of motion. Neck supple.  Cardiovascular: Normal rate and regular rhythm.   No murmur heard. Pulmonary/Chest: Effort normal and breath sounds normal. She exhibits no tenderness.  Abdominal: Soft. Bowel sounds are normal. There is no tenderness. There is no rebound and no guarding.  Musculoskeletal: Normal range of motion. She exhibits no edema.  Neurological: She is alert and oriented to person, place, and time.  Skin: Skin  is warm and dry. No rash noted.  Psychiatric: She has a normal mood and affect.    ED Course  Procedures (including critical care time) Labs Review Labs Reviewed  CBC WITH DIFFERENTIAL - Abnormal; Notable for the following:    RBC 3.66 (*)    Hemoglobin 10.9 (*)    HCT 32.8 (*)    All other components within normal limits  COMPREHENSIVE METABOLIC PANEL - Abnormal; Notable for the following:    Alkaline Phosphatase 123 (*)    Total Bilirubin <0.2 (*)    GFR calc non Af Amer 69 (*)    GFR calc Af Amer 80 (*)    All other components within normal limits  POCT I-STAT TROPONIN I   Imaging Review Dg Chest 2 View  07/14/2013   CLINICAL DATA:  Chest pain for 3 days.  EXAM: CHEST  2 VIEW  COMPARISON:  CT chest 04/07/2011 and plain film of the chest 04/07/2011.  FINDINGS: The patient is status post CABG. Heart size is normal. Lungs are clear. There is no pneumothorax or pleural effusion. No focal bony abnormality.  IMPRESSION: No acute disease.   Electronically Signed   By: Inge Rise M.D.   On: 07/14/2013 19:18    EKG Interpretation    Date/Time:  Tuesday July 14 2013 18:22:33 EST Ventricular Rate:  78 PR Interval:  154 QRS Duration: 76 QT Interval:  374 QTC Calculation: 426 R Axis:   56 Text Interpretation:  Normal sinus rhythm Nonspecific ST abnormality Abnormal ECG No significant change since last tracing 04/2011 Confirmed by KOHUT  MD, Cameron ([id number]) on 07/14/2013 6:29:29 PM            MDM  No diagnosis found. 1. Chest pain  She has a non-acute EKG, negative initial Troponin and is currently pain-free, however, she has a significant history of cardiac disease, multiple risk factors with pain similar to previous MI. She has taken her aspirin today. Discussed with Dr. Wynonia Lawman who will see in the emergency department to evaluate for admission.    Dewaine Oats, PA-C 07/14/13 1956

## 2013-07-14 NOTE — ED Notes (Signed)
Pt c/o mid chest pain radiating through to back onset 2 days ago but today pain is worse with nausea and radiation into left arm

## 2013-07-14 NOTE — ED Notes (Signed)
Pt reports back pain that started appx 2 days ago while resting, States it started to radiate towards chest yesterday. Pt describes pain as pressure that is in back radiating towards chest. Associated with SOB, nausea and weakness.  Pt denies worse with movement/inspiration.  Hx: MI, 8 stents. AO x4. NAD.

## 2013-07-14 NOTE — ED Notes (Signed)
Cardiology at bedside.

## 2013-07-14 NOTE — ED Notes (Signed)
Transporting patient to new room assignment. 

## 2013-07-14 NOTE — H&P (Addendum)
History and Physical   Admit date: 07/14/2013 Name:  [name] Medical record number: [id number] DOB/Age:  [birth year]  63 y.o. female  Referring Physician:   [facility]  Primary Cardiologist:  Dr. Golden Hurter  Primary Physician:  Dr. Carol Ada  Chief complaint/reason for admission: Chest pain  HPI:  This 63 year old female has a very complex past history. In 2007 she had unstable angina and had stenting of the LAD with a 3.5 x 16 mm Liberte stent. Following that she had PTCA of the diagonal branch for restenosis and had multiple catheterizations because recurrent chest pain and eventually had bypass grafting off-pump with a mammary graft to the LAD and a vein graft to the diagonal branch for Dr. Madolyn Frieze on 125/08. She presented with congestive heart failure in June of that year and a repeat catheterization was found to have occluded her diagonal graft and had stenting of the diagonal branch with a 2.5 x 12 mm Endeavor stent. She did had recurrent chest pain through the years and occasionally his nitroglycerin and was eventually re\re catheterize last in 2010 and thought to have small vessel disease. She has been treated medically since that time without recurrent admissions for chest pain.   She began to have chest pain in the month of December and about 3 days ago began to have substernal chest discomfort Sunday which was persistent. She described the pain as beginning in her mid back and radiating through to her chest and described as pressure. The pain was dull and she did not think take nitroglycerin but took several yesterday with some relief. The discomfort intensified and she eventually came to the emergency room. She describes the pain as pressure and similar to her previous anginal pain. He does not radiating and is associated with sweating and is not pleuritic. She became somewhat dyspneic earlier today but is currently pain-free. She is relatively inactive and is  obese.  She has been involved in a drug study for lipid-lowering through [facility] recently. She has occasional nausea and uses medication for this periodically she had nausea earlier today for which she did take ondesantren.   Past Medical History  Diagnosis Date  . Hypertension   . Diabetes mellitus   . CAD (coronary artery disease)   . MI (myocardial infarction)   . Grave's disease        Past Surgical History  Procedure Laterality Date  . Coronary angioplasty with stent placement    . Coronary artery bypass graft    . Cholecystectomy, laparoscopic    . Tubal ligation    .  Allergies: has No Known Allergies.   Medications: Prior to Admission medications   Medication Sig Start Date End Date Taking? Authorizing Provider  aspirin 325 MG tablet Take 325 mg by mouth daily.   Yes Historical Provider, MD  atorvastatin (LIPITOR) 80 MG tablet Take 80 mg by mouth every evening.   Yes Historical Provider, MD  escitalopram (LEXAPRO) 5 MG tablet Take 5 mg by mouth daily.   Yes Historical Provider, MD  hydrochlorothiazide (HYDRODIURIL) 25 MG tablet Take 25 mg by mouth daily.   Yes Historical Provider, MD  lisinopril (PRINIVIL,ZESTRIL) 5 MG tablet Take 5 mg by mouth daily.   Yes Historical Provider, MD  metFORMIN (GLUCOPHAGE) 1000 MG tablet Take 1,000 mg by mouth 2 (two) times daily with a meal.   Yes Historical Provider, MD  metoprolol tartrate (LOPRESSOR) 25 MG tablet Take 25 mg by mouth 2 (two) times daily.  Yes Historical Provider, MD  omeprazole (PRILOSEC) 20 MG capsule Take 20 mg by mouth daily.   Yes Historical Provider, MD  ondansetron (ZOFRAN) 4 MG tablet Take 4 mg by mouth every 8 (eight) hours as needed for nausea or vomiting.   Yes Historical Provider, MD  STUDY MEDICATION Take 1 tablet by mouth daily. Evacetrapib (CETP inhibitor) lipid lowering study with [facility]   Yes Historical Provider, MD    Family History:  Family Status  Relation Status Death Age  . Father  Deceased 37's    lung cancer  . Mother Deceased 33    lung cancer  . Brother Alive   . Brother Alive   . Brother Alive   . Sister Alive   . Sister Alive   . Sister Alive   . Sister Alive     Social History:   reports that she has never smoked. She has never used smokeless tobacco. She reports that she does not drink alcohol or use illicit drugs.   History   Social History Narrative  . No narrative on file     Review of Systems: He has significant reflux in the past. She has been obese and unable to lose weight. Very mild dyspnea with exertion. No claudication. Other than as noted above, the remainder of the review of systems is normal  Physical Exam: BP 103/42  Pulse 71  Temp(Src) 97.8 F (36.6 C) (Oral)  Resp 20  Ht 4\' 11"  (1.499 m)  Wt 89.557 kg (197 lb 7 oz)  BMI 39.86 kg/m2  SpO2 97%  General appearance: Short, obese pleasant female in no acute distress Head: Normocephalic, without obvious abnormality, atraumatic Eyes: conjunctivae/corneas clear. PERRL, EOM's intact. Fundi not examined  Neck: no adenopathy, no carotid bruit, no JVD and supple, symmetrical, trachea midline Lungs: clear to auscultation bilaterally Heart: regular rate and rhythm, S1, S2 normal, no murmur, click, rub or gallop Abdomen: soft, non-tender; bowel sounds normal; no masses,  no organomegaly Pelvic: deferred Extremities: extremities normal, atraumatic, no cyanosis or edema Pulses: 2+ and symmetric Neurologic: Grossly normal  Labs: CBC  Recent Labs  07/14/13 1841  WBC 9.8  RBC 3.66*  HGB 10.9*  HCT 32.8*  PLT 263  MCV 89.6  MCH 29.8  MCHC 33.2  RDW 14.0  LYMPHSABS 2.9  MONOABS 0.5  EOSABS 0.2  BASOSABS 0.0   CMP   Recent Labs  07/14/13 1841  NA 138  K 4.9  CL 100  CO2 26  GLUCOSE 93  BUN 19  CREATININE 0.87  CALCIUM 8.7  PROT 6.7  ALBUMIN 3.7  AST 17  ALT 12  ALKPHOS 123*  BILITOT <0.2*  GFRNONAA 69*  GFRAA 80*     Cardiac Panel (last 3  results) Troponin (Point of Care Test)  Recent Labs  07/14/13 1903  TROPIPOC 0.00   EKG: T wave inversion in V2 with nonspecific ST changes  Radiology: Clear lungs, no acute cardiopulmonary disease   IMPRESSIONS: 1. Prolonged chest pain with ischemic features and other atypical features in a patient with known complex coronary artery disease but negative enzymes 2. Coronary artery disease with previous stenting of the LAD and diagonal branch and previous bypass grafting of the LAD 3. Morbid obesity 4. Hypertension 5. Hyperlipidemia 6. Type 2 diabetes mellitus 7. History of Graves' disease 8. GERD 9. Anemia  PLAN: Patient will have serial cardiac enzymes and be heparinized. She is currently pain-free. I suspect that she will need to have repeat catheterization because of  her prior coronary artery disease history in the significant chest pain history the past several days as well as abnormal EKG.  Signed: Kerry Hough MD [facility]  07/14/2013, 8:30 PM

## 2013-07-14 NOTE — ED Provider Notes (Signed)
Medical screening examination/treatment/procedure(s) were performed by non-physician practitioner and as supervising physician I was immediately available for consultation/collaboration.  EKG Interpretation    Date/Time:  Tuesday July 14 2013 18:22:33 EST Ventricular Rate:  78 PR Interval:  154 QRS Duration: 76 QT Interval:  374 QTC Calculation: 426 R Axis:   56 Text Interpretation:  Normal sinus rhythm Nonspecific ST abnormality Abnormal ECG No significant change since last tracing 04/2011 Confirmed by [place]  MD, [place] ([id number]) on 07/14/2013 6:29:29 PM           She presents to ER for evaluation of chest pain radiating to the back. Patient has a history of coronary artery disease and at night. She reports that this pain is similar. Initial workup is negative, will hospitalization for further management.  Orpah Greek, MD 07/14/13 2005

## 2013-07-15 ENCOUNTER — Encounter (HOSPITAL_COMMUNITY)
Admission: EM | Disposition: A | Discharge status: Home / Self Care | Payer: Self-pay | Source: Home / Self Care | Attending: Cardiology

## 2013-07-15 ENCOUNTER — Encounter (HOSPITAL_COMMUNITY): Payer: Self-pay | Admitting: Physician Assistant

## 2013-07-15 DIAGNOSIS — K219 Gastro-esophageal reflux disease without esophagitis: Secondary | ICD-10-CM

## 2013-07-15 DIAGNOSIS — I2510 Atherosclerotic heart disease of native coronary artery without angina pectoris: Secondary | ICD-10-CM

## 2013-07-15 DIAGNOSIS — R079 Chest pain, unspecified: Secondary | ICD-10-CM

## 2013-07-15 DIAGNOSIS — E669 Obesity, unspecified: Secondary | ICD-10-CM

## 2013-07-15 DIAGNOSIS — I10 Essential (primary) hypertension: Secondary | ICD-10-CM

## 2013-07-15 DIAGNOSIS — E785 Hyperlipidemia, unspecified: Secondary | ICD-10-CM

## 2013-07-15 HISTORY — PX: LEFT HEART CATHETERIZATION WITH CORONARY ANGIOGRAM: SHX5451

## 2013-07-15 LAB — CBC
HCT: 32.4 % — ABNORMAL LOW (ref 36.0–46.0)
Hemoglobin: 10.6 g/dL — ABNORMAL LOW (ref 12.0–15.0)
MCH: 28.9 pg (ref 26.0–34.0)
MCHC: 32.7 g/dL (ref 30.0–36.0)
MCV: 88.3 fL (ref 78.0–100.0)
PLATELETS: 223 10*3/uL (ref 150–400)
RBC: 3.67 MIL/uL — ABNORMAL LOW (ref 3.87–5.11)
RDW: 14 % (ref 11.5–15.5)
WBC: 8.8 10*3/uL (ref 4.0–10.5)

## 2013-07-15 LAB — PROTIME-INR
INR: 0.97 (ref 0.00–1.49)
PROTHROMBIN TIME: 12.7 s (ref 11.6–15.2)

## 2013-07-15 LAB — TSH: TSH: 0.402 u[IU]/mL (ref 0.350–4.500)

## 2013-07-15 LAB — IRON AND TIBC
Iron: 34 ug/dL — ABNORMAL LOW (ref 42–135)
SATURATION RATIOS: 9 % — AB (ref 20–55)
TIBC: 372 ug/dL (ref 250–470)
UIBC: 338 ug/dL (ref 125–400)

## 2013-07-15 LAB — HEMOGLOBIN A1C
Hgb A1c MFr Bld: 6.8 % — ABNORMAL HIGH (ref <5.7)
Mean Plasma Glucose: 148 mg/dL — ABNORMAL HIGH (ref <117)

## 2013-07-15 LAB — TROPONIN I

## 2013-07-15 LAB — HEPARIN LEVEL (UNFRACTIONATED): Heparin Unfractionated: 0.52 IU/mL (ref 0.30–0.70)

## 2013-07-15 SURGERY — LEFT HEART CATHETERIZATION WITH CORONARY ANGIOGRAM
Anesthesia: LOCAL

## 2013-07-15 MED ORDER — SODIUM CHLORIDE 0.9 % IJ SOLN
3.0000 mL | Freq: Two times a day (BID) | INTRAMUSCULAR | Status: DC
Start: 2013-07-15 — End: 2013-07-15

## 2013-07-15 MED ORDER — MIDAZOLAM HCL 2 MG/2ML IJ SOLN
INTRAMUSCULAR | Status: AC
  Filled 2013-07-15: qty 2

## 2013-07-15 MED ORDER — SODIUM CHLORIDE 0.9 % IV SOLN: 250.0000 mL | INTRAVENOUS | Status: DC | PRN

## 2013-07-15 MED ORDER — METFORMIN HCL 1000 MG PO TABS: 1000.0000 mg | ORAL_TABLET | Freq: Two times a day (BID) | ORAL | Status: DC

## 2013-07-15 MED ORDER — NITROGLYCERIN 0.2 MG/ML ON CALL CATH LAB
INTRAVENOUS | Status: AC
  Filled 2013-07-15: qty 1

## 2013-07-15 MED ORDER — SODIUM CHLORIDE 0.9 % IV SOLN
INTRAVENOUS | Status: DC
Start: 2013-07-15 — End: 2013-07-15

## 2013-07-15 MED ORDER — SODIUM CHLORIDE 0.9 % IV SOLN: 1.0000 mL/kg/h | INTRAVENOUS | Status: DC

## 2013-07-15 MED ORDER — FENTANYL CITRATE 0.05 MG/ML IJ SOLN
INTRAMUSCULAR | Status: AC
  Filled 2013-07-15: qty 2

## 2013-07-15 MED ORDER — SODIUM CHLORIDE 0.9 % IJ SOLN: 3.0000 mL | INTRAMUSCULAR | Status: DC | PRN

## 2013-07-15 MED ORDER — INFLUENZA VAC SPLIT QUAD 0.5 ML IM SUSP: 0.5000 mL | INTRAMUSCULAR | Status: DC

## 2013-07-15 MED ORDER — ASPIRIN 81 MG PO CHEW
81.0000 mg | CHEWABLE_TABLET | ORAL | Status: AC
  Administered 2013-07-15: 81 mg via ORAL
  Filled 2013-07-15: qty 1

## 2013-07-15 MED ORDER — VERAPAMIL HCL 2.5 MG/ML IV SOLN
INTRAVENOUS | Status: AC
  Filled 2013-07-15: qty 2

## 2013-07-15 MED ORDER — STUDY - INVESTIGATIONAL MEDICATION: 1.0000 | Freq: Every day | Status: DC

## 2013-07-15 MED ORDER — HEPARIN (PORCINE) IN NACL 2-0.9 UNIT/ML-% IJ SOLN
INTRAMUSCULAR | Status: AC
  Filled 2013-07-15: qty 1500

## 2013-07-15 MED ORDER — HEPARIN SODIUM (PORCINE) 1000 UNIT/ML IJ SOLN
INTRAMUSCULAR | Status: AC
Start: 2013-07-15 — End: 2013-07-15
  Filled 2013-07-15: qty 1

## 2013-07-15 MED ORDER — LIDOCAINE HCL (PF) 1 % IJ SOLN
INTRAMUSCULAR | Status: AC
  Filled 2013-07-15: qty 30

## 2013-07-15 NOTE — Progress Notes (Signed)
  Subjective: Still complaining of mild 1-2/10 back pain.  Objective: Vital signs in last 24 hours: Temp:  [97.8 F (36.6 C)-97.9 F (36.6 C)] 97.9 F (36.6 C) (01/14 0532) Pulse Rate:  [70-76] 70 (01/14 0532) Resp:  [20-21] 20 (01/14 0532) BP: (103-119)/(42-64) 111/56 mmHg (01/14 0532) SpO2:  [97 %-98 %] 98 % (01/14 0532) Weight:  [197 lb 3.4 oz (89.455 kg)-197 lb 7 oz (89.557 kg)] 197 lb 3.4 oz (89.455 kg) (01/14 0532)    Intake/Output from previous day:   Intake/Output this shift:    Medications Current Facility-Administered Medications  Medication Dose Route Frequency Provider Last Rate Last Dose  . 0.9 %  sodium chloride infusion  250 mL Intravenous PRN William S Tilley, MD      . acetaminophen (TYLENOL) tablet 650 mg  650 mg Oral Q4H PRN William S Tilley, MD      . aspirin tablet 325 mg  325 mg Oral Daily William S Tilley, MD      . atorvastatin (LIPITOR) tablet 80 mg  80 mg Oral QPM William S Tilley, MD   80 mg at 07/14/13 2313  . escitalopram (LEXAPRO) tablet 5 mg  5 mg Oral Daily William S Tilley, MD      . heparin ADULT infusion 100 units/mL (25000 units/250 mL)  1,000 Units/hr Intravenous Continuous Traci R Turner, MD 10 mL/hr at 07/14/13 2313 1,000 Units/hr at 07/14/13 2313  . hydrochlorothiazide (HYDRODIURIL) tablet 25 mg  25 mg Oral Daily William S Tilley, MD      . [START ON 07/16/2013] influenza vac split quadrivalent PF (FLUARIX) injection 0.5 mL  0.5 mL Intramuscular Tomorrow-1000 Traci R Turner, MD      . lisinopril (PRINIVIL,ZESTRIL) tablet 5 mg  5 mg Oral Daily William S Tilley, MD      . metoprolol tartrate (LOPRESSOR) tablet 25 mg  25 mg Oral BID William S Tilley, MD   25 mg at 07/14/13 2313  . nitroGLYCERIN (NITROSTAT) SL tablet 0.4 mg  0.4 mg Sublingual Q5 Min x 3 PRN William S Tilley, MD      . ondansetron (ZOFRAN) injection 4 mg  4 mg Intravenous Q6H PRN William S Tilley, MD      . ondansetron (ZOFRAN) tablet 4 mg  4 mg Oral Q8H PRN William S Tilley,  MD      . pantoprazole (PROTONIX) EC tablet 40 mg  40 mg Oral Daily William S Tilley, MD   40 mg at 07/14/13 2313  . sodium chloride 0.9 % injection 3 mL  3 mL Intravenous Q12H William S Tilley, MD      . sodium chloride 0.9 % injection 3 mL  3 mL Intravenous PRN William S Tilley, MD      . STUDY MEDICATION 1 tablet  1 tablet Oral Daily William S Tilley, MD        PE: General appearance: alert, cooperative and no distress Lungs: clear to auscultation bilaterally Heart: regular rate and rhythm and 1/6 sys MM Extremities: No LEE Pulses: 2+ and symmetric Skin: Warm and dry Neurologic: Grossly normal  Lab Results:   Recent Labs  07/14/13 1841 07/15/13 0310  WBC 9.8 8.8  HGB 10.9* 10.6*  HCT 32.8* 32.4*  PLT 263 223   BMET  Recent Labs  07/14/13 1841 07/14/13 2109  NA 138 139  K 4.9 4.8  CL 100 101  CO2 26 27  GLUCOSE 93 100*  BUN 19 18  CREATININE 0.87 0.95  CALCIUM 8.7 8.6     PT/INR  Recent Labs  07/15/13 0551  LABPROT 12.7  INR 0.97    Assessment/Plan   Active Problems:   CAD (coronary artery disease)   Hyperlipidemia   Diabetes mellitus type 2, noninsulin dependent   Hypertension   Unstable angina   Anemia  Plan:   Still having 1-2/10 back pain.  Ruled out for MI thus far.  EKG with nonspecific TW changes similar to previous.  Still having 1-2/10 back pain.  Recommend Cath given history of recurrent and progressive CAD.   Anemia-Apparently chronic.  Will check iron panel.    BP and HR stable.   LOS: 1 day    HAGER, BRYAN 07/15/2013 8:41 AM  The patient was seen, examined and discussed with Tarri Fuller, PA-C and I agree with the above.   The symptoms appear typical for the patient and qualify as unstable angina, we will schedule for a cath today. Continue ASA 325 mg qd, high dose atorvastatin, ACEI, metoprolol, iv NTG.  Ena Dawley, Lemmie Evens 07/15/2013

## 2013-07-15 NOTE — H&P (View-Only) (Signed)
Subjective: Still complaining of mild 1-2/10 back pain.  Objective: Vital signs in last 24 hours: Temp:  [97.8 F (36.6 C)-97.9 F (36.6 C)] 97.9 F (36.6 C) (01/14 0532) Pulse Rate:  [70-76] 70 (01/14 0532) Resp:  [20-21] 20 (01/14 0532) BP: (103-119)/(42-64) 111/56 mmHg (01/14 0532) SpO2:  [97 %-98 %] 98 % (01/14 0532) Weight:  [197 lb 3.4 oz (89.455 kg)-197 lb 7 oz (89.557 kg)] 197 lb 3.4 oz (89.455 kg) (01/14 0532)    Intake/Output from previous day:   Intake/Output this shift:    Medications Current Facility-Administered Medications  Medication Dose Route Frequency Provider Last Rate Last Dose  . 0.9 %  sodium chloride infusion  250 mL Intravenous PRN Jacolyn Reedy, MD      . acetaminophen (TYLENOL) tablet 650 mg  650 mg Oral Q4H PRN Jacolyn Reedy, MD      . aspirin tablet 325 mg  325 mg Oral Daily Jacolyn Reedy, MD      . atorvastatin (LIPITOR) tablet 80 mg  80 mg Oral QPM Jacolyn Reedy, MD   80 mg at 07/14/13 2313  . escitalopram (LEXAPRO) tablet 5 mg  5 mg Oral Daily Jacolyn Reedy, MD      . heparin ADULT infusion 100 units/mL (25000 units/250 mL)  1,000 Units/hr Intravenous Continuous Sueanne Margarita, MD 10 mL/hr at 07/14/13 2313 1,000 Units/hr at 07/14/13 2313  . hydrochlorothiazide (HYDRODIURIL) tablet 25 mg  25 mg Oral Daily Jacolyn Reedy, MD      . Derrill Memo ON 07/16/2013] influenza vac split quadrivalent PF (FLUARIX) injection 0.5 mL  0.5 mL Intramuscular Tomorrow-1000 Sueanne Margarita, MD      . lisinopril (PRINIVIL,ZESTRIL) tablet 5 mg  5 mg Oral Daily Jacolyn Reedy, MD      . metoprolol tartrate (LOPRESSOR) tablet 25 mg  25 mg Oral BID Jacolyn Reedy, MD   25 mg at 07/14/13 2313  . nitroGLYCERIN (NITROSTAT) SL tablet 0.4 mg  0.4 mg Sublingual Q5 Min x 3 PRN Jacolyn Reedy, MD      . ondansetron [facility]) injection 4 mg  4 mg Intravenous Q6H PRN Jacolyn Reedy, MD      . ondansetron [facility]) tablet 4 mg  4 mg Oral Q8H PRN Jacolyn Reedy,  MD      . pantoprazole (PROTONIX) EC tablet 40 mg  40 mg Oral Daily Jacolyn Reedy, MD   40 mg at 07/14/13 2313  . sodium chloride 0.9 % injection 3 mL  3 mL Intravenous Q12H Jacolyn Reedy, MD      . sodium chloride 0.9 % injection 3 mL  3 mL Intravenous PRN Jacolyn Reedy, MD      . STUDY MEDICATION 1 tablet  1 tablet Oral Daily Jacolyn Reedy, MD        PE: General appearance: alert, cooperative and no distress Lungs: clear to auscultation bilaterally Heart: regular rate and rhythm and 1/6 sys MM Extremities: No LEE Pulses: 2+ and symmetric Skin: Warm and dry Neurologic: Grossly normal  Lab Results:   Recent Labs  07/14/13 1841 07/15/13 0310  WBC 9.8 8.8  HGB 10.9* 10.6*  HCT 32.8* 32.4*  PLT 263 223   BMET  Recent Labs  07/14/13 1841 07/14/13 2109  NA 138 139  K 4.9 4.8  CL 100 101  CO2 26 27  GLUCOSE 93 100*  BUN 19 18  CREATININE 0.87 0.95  CALCIUM 8.7 8.6  PT/INR  Recent Labs  07/15/13 0551  LABPROT 12.7  INR 0.97    Assessment/Plan   Active Problems:   CAD (coronary artery disease)   Hyperlipidemia   Diabetes mellitus type 2, noninsulin dependent   Hypertension   Unstable angina   Anemia  Plan:   Still having 1-2/10 back pain.  Ruled out for MI thus far.  EKG with nonspecific TW changes similar to previous.  Still having 1-2/10 back pain.  Recommend Cath given history of recurrent and progressive CAD.   Anemia-Apparently chronic.  Will check iron panel.    BP and HR stable.   LOS: 1 day    HAGER, BRYAN 07/15/2013 8:41 AM  The patient was seen, examined and discussed with Tarri Fuller, PA-C and I agree with the above.   The symptoms appear typical for the patient and qualify as unstable angina, we will schedule for a cath today. Continue ASA 325 mg qd, high dose atorvastatin, ACEI, metoprolol, iv NTG.  Ena Dawley, Lemmie Evens 07/15/2013

## 2013-07-15 NOTE — Progress Notes (Signed)
ANTICOAGULATION CONSULT NOTE Pharmacy Consult for heparin Indication: chest pain/ACS  No Known Allergies  Patient Measurements: Height: 4' 11.5" (151.1 cm) Weight: 197 lb 3.4 oz (89.455 kg) IBW/kg (Calculated) : 44.35 Heparin Dosing Weight: 67 kg  Vital Signs: Temp: 97.9 F (36.6 C) (01/14 0532) Temp src: Oral (01/14 0532) BP: 111/56 mmHg (01/14 0532) Pulse Rate: 70 (01/14 0532)  Labs:  Recent Labs  07/14/13 1841 07/14/13 2108 07/14/13 2109 07/15/13 0310  HGB 10.9*  --   --  10.6*  HCT 32.8*  --   --  32.4*  PLT 263  --   --  223  HEPARINUNFRC  --   --   --  0.52  CREATININE 0.87  --  0.95  --   TROPONINI  --  <0.30  --  <0.30    Estimated Creatinine Clearance: 59.7 ml/min (by C-G formula based on Cr of 0.95).  Assessment: 63 y.o. female with chest pain for heparin   Goal of Therapy:  Heparin level 0.3-0.7 units/ml Monitor platelets by anticoagulation protocol: Yes   Plan:  Continue Heparin at current rate  Caryl Pina 07/15/2013,6:15 AM

## 2013-07-15 NOTE — Progress Notes (Signed)
UR Completed Beverlie Kurihara Graves-Bigelow, RN,BSN [phone]  

## 2013-07-15 NOTE — Discharge Summary (Signed)
Discharge Summary   Patient ID: [name] MRN: [id number], DOB/AGE: [birth year] 64 y.o. Admit date: 07/14/2013 D/C date:     07/15/2013  Primary Cardiologist: Dr. Golden Hurter   Active Problems:   CAD- s/p cardiac cath on this admission-- no change from previous study (2010) and treated medically    Hyperlipidemia   Diabetes mellitus type 2, noninsulin dependent   Hypertension   GERD   Obesity (BMI 30-39.9)   Graves disease   Hospital Course:  [name] is a 63 y.o. female with a history of obesity, hypertension, DM, graves disease, and CAD s/p CABG and multiple stents. In 2007 she had unstable angina and had stenting of the LAD with a 3.5 x 16 mm Liberte stent. Following that she had PTCA of the diagonal branch for restenosis and had multiple catheterizations because recurrent chest pain and eventually had bypass grafting off-pump with a mammary graft to the LAD and a vein graft to the diagonal branch for Dr. Madolyn Frieze in 07/2006. She presented with congestive heart failure in June of that year and a repeat catheterization was found to have occluded her diagonal graft and had stenting of the diagonal branch with a 2.5 x 12 mm Endeavor stent. She had recurrent chest pain through the years and was catheterized last in 2010 and thought to have small vessel disease. She has been treated medically since that time without recurrent admissions for chest pain.  [name] began to notice non-specific chest pain in Dec 2014 that became worse in the first week of January. She reported substernal chest discomfort that was reminiscent of previous cardiac chest pain and partially relieved by NTG. The discomfort intensified and she eventually came to the emergency room and was admitted on 07/14/13. Her EKG had T wave inversion in V2 with nonspecific ST changes and her troponin remained negative. With her complex CAD history and chest pain similar to previous cardiac chest pain, her symptoms were  considered unstable angina and it was elected to proceed with cardiac catheterization. Cath on 07/15/13 revealed single vessel obstructive CAD involving the mid LAD; patent LIMA to the LAD; chronic occlusion of the SVG to the diagonal; and normal LV function. There was no change when compared to prior study in 2010 and continued medical management was recommended.   Dr. Martinique has evaluated the patient today and deemed her ready for discharge home. No changes in her medical regimen have been made and she should follow up with her cardiologist, Dr. Radford Pax, as routinely scheduled.   Discharge Vitals: Blood pressure 100/49, pulse 74, temperature 98.5 F (36.9 C), temperature source Oral, resp. rate 20, height 4' 11.5" (1.511 m), weight 197 lb 3.4 oz (89.455 kg), SpO2 96.00%.  Labs: Lab Results  Component Value Date   WBC 8.8 07/15/2013   HGB 10.6* 07/15/2013   HCT 32.4* 07/15/2013   MCV 88.3 07/15/2013   PLT 223 07/15/2013     Recent Labs Lab 07/14/13 2109  NA 139  K 4.8  CL 101  CO2 27  BUN 18  CREATININE 0.95  CALCIUM 8.6  PROT 6.3  BILITOT <0.2*  ALKPHOS 115  ALT 12  AST 13  GLUCOSE 100*    Recent Labs  07/14/13 2108 07/15/13 0310 07/15/13 0821  TROPONINI <0.30 <0.30 <0.30    Diagnostic Studies/Procedures   Dg Chest 2 View  07/14/2013   CLINICAL DATA:  Chest pain for 3 days.  EXAM: CHEST  2 VIEW  COMPARISON:  CT  chest 04/07/2011 and plain film of the chest 04/07/2011.  FINDINGS: The patient is status post CABG. Heart size is normal. Lungs are clear. There is no pneumothorax or pleural effusion. No focal bony abnormality.  IMPRESSION: No acute disease.   Cardiac Catheterization Procedure Note 07/15/13 Procedure: Left Heart Cath, Selective Coronary Angiography, SVG angiography, LIMA angiography, LV angiography  Indication: 63 yo WF with history of CAD s/p CABG and prior stents presents with exacerbation of chronic chest pain.  Procedural Details: The left wrist was prepped,  draped, and anesthetized with 1% lidocaine. Using the modified Seldinger technique, a 5 French sheath was introduced into the left radial artery. 3 mg of verapamil was administered through the sheath, weight-based unfractionated heparin was administered intravenously. Standard Judkins catheters were used for selective coronary angiography and left ventriculography. Judkins left 3.0 was used for the left coronary artery. LIMA graft was used for the IMA and LCB catheter for the SVG.Catheter exchanges were performed over an exchange length guidewire. There were no immediate procedural complications. A TR band was used for radial hemostasis at the completion of the procedure. The patient was transferred to the post catheterization recovery area for further monitoring.  Procedural Findings:  Hemodynamics:  AO 126/55 mean 86 mm Hg  LV 128/14 mm Hg  Coronary angiography:  Coronary dominance: right  Left mainstem: Short and normal.  Left anterior descending (LAD): There is a stent in the mid LAD. The LAD has a 90% stenosis following the second diagonal. The distal vessel fills competively by the IMA graft. The first and second diagonals are normal.  Left circumflex (LCx): Small, normal.  Right coronary artery (RCA): Large dominant vessel without significant disease. Moderate calcification proximally with less than 20% stenosis.  SVG to diagonal is chronically occluded.  LIMA- difficult to directly engage but flush shots demonstrate that it is widely patent.  Left ventriculography: Left ventricular systolic function is normal, LVEF is estimated at 55-65%, there is no significant mitral regurgitation  Final Conclusions:  1. Single vessel obstructive CAD involving the mid LAD  2. Patent LIMA to the LAD.  3. Chronic occlusion of the SVG to the diagonal  4. Normal LV function.  Recommendations: No change compared to prior study in 2010. Continue medical management.   Discharge Medications     Medication  List         aspirin 325 MG tablet  Take 325 mg by mouth daily.     atorvastatin 80 MG tablet  Commonly known as:  LIPITOR  Take 80 mg by mouth every evening.     escitalopram 5 MG tablet  Commonly known as:  LEXAPRO  Take 5 mg by mouth daily.     hydrochlorothiazide 25 MG tablet  Commonly known as:  HYDRODIURIL  Take 25 mg by mouth daily.     lisinopril 5 MG tablet  Commonly known as:  PRINIVIL,ZESTRIL  Take 5 mg by mouth daily.     metFORMIN 1000 MG tablet  Commonly known as:  GLUCOPHAGE  Take 1 tablet (1,000 mg total) by mouth 2 (two) times daily with a meal.  Start taking on:  07/18/2013     metoprolol tartrate 25 MG tablet  Commonly known as:  LOPRESSOR  Take 25 mg by mouth 2 (two) times daily.     omeprazole 20 MG capsule  Commonly known as:  PRILOSEC  Take 20 mg by mouth daily.     ondansetron 4 MG tablet  Commonly known as:  ZOFRAN  Take  4 mg by mouth every 8 (eight) hours as needed for nausea or vomiting.     STUDY MEDICATION  Take 1 tablet by mouth daily. Evacetrapib (CETP inhibitor) lipid lowering study with [employer]        Disposition   The patient will be discharged in stable condition to home. Discharge Orders   Future Appointments Provider Department Dept Phone   08/18/2013 9:15 AM Sueanne Margarita, MD [facility] [phone]   Future Orders Complete By Expires   Diet - low sodium heart healthy  As directed    Increase activity slowly  As directed      Follow-up Information   Follow up with Sueanne Margarita, MD. (please call to follow up with your primary cardiologist )    Specialty:  Cardiology   Contact information:   [address] [place] [address] [phone]         Duration of Discharge Encounter: Greater than 30 minutes including physician and PA time.  SignedVertell Limber, Bronte Sabado PA-C 07/15/2013, 4:45 PM

## 2013-07-15 NOTE — Discharge Instructions (Signed)
PLEASE DO NOT RESUME YOUR METFORMIN UNTIL 07/18/13. THIS DRUG CAN INTERACT WITH THE CONTRAST DYE YOU RECEIVED WITH YOUR CARDIAC CATHETERIZATION. ON 07/18/13 YOU CAN RESUME AS YOU NORMALLY TAKE IT. PLEASE CONTACT YOUR DOCTOR WITH ANY QUESTIONS.   Radial Site Care Refer to this sheet in the next few weeks. These instructions provide you with information on caring for yourself after your procedure. Your caregiver may also give you more specific instructions. Your treatment has been planned according to current medical practices, but problems sometimes occur. Call your caregiver if you have any problems or questions after your procedure. HOME CARE INSTRUCTIONS  You may shower the day after the procedure.Remove the bandage (dressing) and gently wash the site with plain soap and water.Gently pat the site dry.   Do not apply powder or lotion to the site.   Do not submerge the affected site in water for 3 to 5 days.   Inspect the site at least twice daily.   Do not flex or bend the affected arm for 24 hours.   No lifting over 5 pounds (2.3 kg) for 5 days after your procedure.   Do not drive home if you are discharged the same day of the procedure. Have someone else drive you.   You may drive 24 hours after the procedure unless otherwise instructed by your caregiver.  What to expect:  Any bruising will usually fade within 1 to 2 weeks.   Blood that collects in the tissue (hematoma) may be painful to the touch. It should usually decrease in size and tenderness within 1 to 2 weeks.  SEEK IMMEDIATE MEDICAL CARE IF:  You have unusual pain at the radial site.   You have redness, warmth, swelling, or pain at the radial site.   You have drainage (other than a small amount of blood on the dressing).   You have chills.   You have a fever or persistent symptoms for more than 72 hours.   You have a fever and your symptoms suddenly get worse.   Your arm becomes pale, cool, tingly, or numb.   You  have heavy bleeding from the site. Hold pressure on the site.   Cardiac Diet This diet can help prevent heart disease and stroke. Many factors influence your heart health, including eating and exercise habits. Coronary risk rises a lot with abnormal blood fat (lipid) levels. Cardiac meal planning includes limiting unhealthy fats, increasing healthy fats, and making other small dietary changes. General guidelines are as follows:  Adjust calorie intake to reach and maintain desirable body weight.  Limit total fat intake to less than 30% of total calories. Saturated fat should be less than 7% of calories.  Saturated fats are found in animal products and in some vegetable products. Saturated vegetable fats are found in coconut oil, cocoa butter, palm oil, and palm kernel oil. Read labels carefully to avoid these products as much as possible. Use butter in moderation. Choose tub margarines and oils that have 2 grams of fat or less. Good cooking oils are canola and olive oils.  Practice low-fat cooking techniques. Do not fry food. Instead, broil, bake, boil, steam, grill, roast on a rack, stir-fry, or microwave it. Other fat reducing suggestions include:  Remove the skin from poultry.  Remove all visible fat from meats.  Skim the fat off stews, soups, and gravies before serving them.  Steam vegetables in water or broth instead of sauting them in fat.  Avoid foods with trans fat (or hydrogenated  oils), such as commercially fried foods and commercially baked goods. Commercial shortening and deep-frying fats will contain trans fat.  Increase intake of fruits, vegetables, whole grains, and legumes to replace foods high in fat.  Increase consumption of nuts, legumes, and seeds to at least 4 servings weekly. One serving of a legume equals  cup, and 1 serving of nuts or seeds equals  cup.  Choose whole grains more often. Have 3 servings per day (a serving is 1 ounce [oz]).  Eat 4 to 5 servings of  vegetables per day. A serving of vegetables is 1 cup of raw leafy vegetables;  cup of raw or cooked cut-up vegetables;  cup of vegetable juice.  Eat 4 to 5 servings of fruit per day. A serving of fruit is 1 medium whole fruit;  cup of dried fruit;  cup of fresh, frozen, or canned fruit;  cup of 100% fruit juice.  Increase your intake of dietary fiber to 20 to 30 grams per day. Insoluble fiber may help lower your risk of heart disease and may help curb your appetite.  Soluble fiber binds cholesterol to be removed from the blood. Foods high in soluble fiber are dried beans, citrus fruits, oats, apples, bananas, broccoli, Brussels sprouts, and eggplant.  Try to include foods fortified with plant sterols or stanols, such as yogurt, breads, juices, or margarines. Choose several fortified foods to achieve a daily intake of 2 to 3 grams of plant sterols or stanols.  Foods with omega-3 fats can help reduce your risk of heart disease. Aim to have a 3.5 oz portion of fatty fish twice per week, such as salmon, mackerel, albacore tuna, sardines, lake trout, or herring. If you wish to take a fish oil supplement, choose one that contains 1 gram of both DHA and EPA.  Limit processed meats to 2 servings (3 oz portion) weekly.  Limit the sodium in your diet to 1500 milligrams (mg) per day. If you have high blood pressure, talk to a registered dietitian about a DASH (Dietary Approaches to Stop Hypertension) eating plan.  Limit sweets and beverages with added sugar, such as soda, to no more than 5 servings per week. One serving is:   1 tablespoon sugar.  1 tablespoon jelly or jam.   cup sorbet.  1 cup lemonade.   cup regular soda. CHOOSING FOODS Starches  Allowed: Breads: All kinds (wheat, rye, raisin, white, oatmeal, [place], [place], and English muffin bread). Low-fat rolls: English muffins, frankfurter and hamburger buns, bagels, pita bread, tortillas (not fried). Pancakes, waffles, biscuits, and  muffins made with recommended oil.  Avoid: Products made with saturated or trans fats, oils, or whole milk products. Butter rolls, cheese breads, croissants. Commercial doughnuts, muffins, sweet rolls, biscuits, waffles, pancakes, store-bought mixes. Crackers  Allowed: Low-fat crackers and snacks: Animal, graham, rye, saltine (with recommended oil, no lard), oyster, and matzo crackers. Bread sticks, melba toast, rusks, flatbread, pretzels, and light popcorn.  Avoid: High-fat crackers: cheese crackers, butter crackers, and those made with coconut, palm oil, or trans fat (hydrogenated oils). Buttered popcorn. Cereals  Allowed: Hot or cold whole-grain cereals.  Avoid: Cereals containing coconut, hydrogenated vegetable fat, or animal fat. Potatoes / Pasta / Rice  Allowed: All kinds of potatoes, rice, and pasta (such as macaroni, spaghetti, and noodles).  Avoid: Pasta or rice prepared with cream sauce or high-fat cheese. Chow mein noodles, [place] fries. Vegetables  Allowed: All vegetables and vegetable juices.  Avoid: Fried vegetables. Vegetables in cream, butter, or high-fat cheese  sauces. Limit coconut. Fruit in cream or custard. Protein  Allowed: Limit your intake of meat, seafood, and poultry to no more than 6 oz (cooked weight) per day. All lean, well-trimmed beef, veal, pork, and lamb. All chicken and [place] without skin. All fish and shellfish. Wild game: wild duck, rabbit, pheasant, and venison. Egg whites or low-cholesterol egg substitutes may be used as desired. Meatless dishes: recipes with dried beans, peas, lentils, and tofu (soybean curd). Seeds and nuts: all seeds and most nuts.  Avoid: Prime grade and other heavily marbled and fatty meats, such as short ribs, spare ribs, rib eye roast or steak, frankfurters, sausage, bacon, and high-fat luncheon meats, mutton. Caviar. Commercially fried fish. Domestic duck, goose, venison sausage. Organ meats: liver, gizzard, heart,  chitterlings, brains, kidney, sweetbreads. Dairy  Allowed: Low-fat cheeses: nonfat or low-fat cottage cheese (1% or 2% fat), cheeses made with part skim milk, such as mozzarella, farmers, string, or ricotta. (Cheeses should be labeled no more than 2 to 6 grams fat per oz.). Skim (or 1%) milk: liquid, powdered, or evaporated. Buttermilk made with low-fat milk. Drinks made with skim or low-fat milk or cocoa. Chocolate milk or cocoa made with skim or low-fat (1%) milk. Nonfat or low-fat yogurt.  Avoid: Whole milk cheeses, including colby, cheddar, mu[place], Monte[place] Jack, [place], [place], [place], American, Swiss, and blue. Creamed cottage cheese, cream cheese. Whole milk and whole milk products, including buttermilk or yogurt made from whole milk, drinks made from whole milk. Condensed milk, evaporated whole milk, and 2% milk. Soups and Combination Foods  Allowed: Low-fat low-sodium soups: broth, dehydrated soups, homemade broth, soups with the fat removed, homemade cream soups made with skim or low-fat milk. Low-fat spaghetti, lasagna, chili, and Spanish rice if low-fat ingredients and low-fat cooking techniques are used.  Avoid: Cream soups made with whole milk, cream, or high-fat cheese. All other soups. Desserts and Sweets  Allowed: Sherbet, fruit ices, gelatins, meringues, and angel food cake. Homemade desserts with recommended fats, oils, and milk products. Jam, jelly, honey, marmalade, sugars, and syrups. Pure sugar candy, such as gum drops, hard candy, jelly beans, marshmallows, mints, and small amounts of dark chocolate.  Avoid: Commercially prepared cakes, pies, cookies, frosting, pudding, or mixes for these products. Desserts containing whole milk products, chocolate, coconut, lard, palm oil, or palm kernel oil. Ice cream or ice cream drinks. Candy that contains chocolate, coconut, butter, hydrogenated fat, or unknown ingredients. Buttered syrups. Fats and Oils  Allowed: Vegetable oils:  safflower, sunflower, corn, soybean, cottonseed, sesame, canola, olive, or peanut. Non-hydrogenated margarines. Salad dressing or mayonnaise: homemade or commercial, made with a recommended oil. Low or nonfat salad dressing or mayonnaise.  Limit added fats and oils to 6 to 8 tsp per day (includes fats used in cooking, baking, salads, and spreads on bread). Remember to count the "hidden fats" in foods.  Avoid: Solid fats and shortenings: butter, lard, salt pork, bacon drippings. Gravy containing meat fat, shortening, or suet. Cocoa butter, coconut. Coconut oil, palm oil, palm kernel oil, or hydrogenated oils: these ingredients are often used in bakery products, nondairy creamers, whipped toppings, candy, and commercially fried foods. Read labels carefully. Salad dressings made of unknown oils, sour cream, or cheese, such as blue cheese and Roquefort. Cream, all kinds: half-and-half, light, heavy, or whipping. Sour cream or cream cheese (even if "light" or low-fat). Nondairy cream substitutes: coffee creamers and sour cream substitutes made with palm, palm kernel, hydrogenated oils, or coconut oil. Beverages  Allowed: Coffee (regular or  decaffeinated), tea. Diet carbonated beverages, mineral water. Alcohol: Check with your caregiver. Moderation is recommended.  Avoid: Whole milk, regular sodas, and juice drinks with added sugar. Condiments  Allowed: All seasonings and condiments. Cocoa powder. "Cream" sauces made with recommended ingredients.  Avoid: Carob powder made with hydrogenated fats. SAMPLE MENU Breakfast   cup orange juice   cup oatmeal  1 slice toast  1 tsp margarine  1 cup skim milk Lunch  [place] sandwich with 2 oz [place], 2 slices bread  Lettuce and tomato slices  Fresh fruit  Carrot sticks  Coffee or tea Snack  Fresh fruit or low-fat crackers Dinner  3 oz lean ground beef  1 baked potato  1 tsp margarine   cup asparagus  Lettuce salad  1 tbs  non-creamy dressing   cup peach slices  1 cup skim milk Document Released: 03/27/2008 Document Revised: 12/18/2011 Document Reviewed: 09/11/2011 Exit[employer] Patient Information 2014 [place], [place].

## 2013-07-15 NOTE — Discharge Summary (Signed)
Patient seen and examined and history reviewed. Agree with above findings and plan. See cath report.  Luana Shu 07/15/2013 5:01 PM

## 2013-07-15 NOTE — CV Procedure (Signed)
    Cardiac Catheterization Procedure Note  Name: [name] MRN: [id number] DOB: July 27, 1950  Procedure: Left Heart Cath, Selective Coronary Angiography, SVG angiography, LIMA angiography, LV angiography  Indication: 63 yo WF with history of CAD s/p CABG and prior stents presents with exacerbation of chronic chest pain.   Procedural Details: The left wrist was prepped, draped, and anesthetized with 1% lidocaine. Using the modified Seldinger technique, a 5 French sheath was introduced into the left radial artery. 3 mg of verapamil was administered through the sheath, weight-based unfractionated heparin was administered intravenously. Standard Judkins catheters were used for selective coronary angiography and left ventriculography. Judkins left 3.0 was used for the left coronary artery. LIMA graft was used for the IMA and LCB catheter for the SVG.Catheter exchanges were performed over an exchange length guidewire. There were no immediate procedural complications. A TR band was used for radial hemostasis at the completion of the procedure.  The patient was transferred to the post catheterization recovery area for further monitoring.  Procedural Findings: Hemodynamics: AO 126/55 mean 86 mm Hg LV 128/14 mm Hg  Coronary angiography: Coronary dominance: right  Left mainstem: Short and normal.  Left anterior descending (LAD): There is a stent in the mid LAD. The LAD has a 90% stenosis following the second diagonal. The distal vessel fills competively by the IMA graft. The first and second diagonals are normal.  Left circumflex (LCx): Small, normal.  Right coronary artery (RCA): Large dominant vessel without significant disease. Moderate calcification proximally with less than 20% stenosis.  SVG to diagonal is chronically occluded.  LIMA- difficult to directly engage but flush shots demonstrate that it is widely patent.  Left ventriculography: Left ventricular systolic function is normal,  LVEF is estimated at 55-65%, there is no significant mitral regurgitation   Final Conclusions:   1. Single vessel obstructive CAD involving the mid LAD 2. Patent LIMA to the LAD. 3. Chronic occlusion of the SVG to the diagonal 4. Normal LV function.  Recommendations: No change compared to prior study in 2010. Continue medical management.  [name] 07/15/2013, 3:15 PM

## 2013-07-15 NOTE — Interval H&P Note (Signed)
History and Physical Interval Note:  07/15/2013 2:30 PM  [name]  has presented today for surgery, with the diagnosis of cp  The various methods of treatment have been discussed with the patient and family. After consideration of risks, benefits and other options for treatment, the patient has consented to  Procedure(s): LEFT HEART CATHETERIZATION WITH CORONARY ANGIOGRAM (N/A) as a surgical intervention .  The patient's history has been reviewed, patient examined, no change in status, stable for surgery.  I have reviewed the patient's chart and labs.  Questions were answered to the patient's satisfaction.   Cath Lab Visit (complete for each Cath Lab visit)  Clinical Evaluation Leading to the Procedure:   ACS: yes  Non-ACS:    Anginal Classification: CCS III  Anti-ischemic medical therapy: Minimal Therapy (1 class of medications)  Non-Invasive Test Results: No non-invasive testing performed  Prior CABG: Previous CABG        [name] [facility] 07/15/2013 2:30 PM

## 2013-07-15 NOTE — Care Management Note (Unsigned)
    Page 1 of 1   07/15/2013     10:27:35 AM   CARE MANAGEMENT NOTE 07/15/2013  Patient:  [name], [name]   Account Number:  [id number]  Date Initiated:  07/15/2013  Documentation initiated by:  GRAVES-BIGELOW,Kaysa Roulhac  Subjective/Objective Assessment:   Pt admitted for cp. Plan for cath 07-15-13.     Action/Plan:   CM to continue to monitor for disposition needs.   Anticipated DC Date:  07/17/2013   Anticipated DC Plan:  [place]  CM consult      Choice offered to / List presented to:             Status of service:  In process, will continue to follow Medicare Important Message given?   (If response is "NO", the following Medicare IM given date fields will be blank) Date Medicare IM given:   Date Additional Medicare IM given:    Discharge Disposition:    Per UR Regulation:  Reviewed for med. necessity/level of care/duration of stay  If discussed at [place] of Stay Meetings, dates discussed:    Comments:

## 2013-08-05 ENCOUNTER — Encounter: Payer: Self-pay | Admitting: General Surgery

## 2013-08-18 ENCOUNTER — Ambulatory Visit: Payer: BC Managed Care – PPO | Admitting: Cardiology

## 2013-09-16 ENCOUNTER — Ambulatory Visit (INDEPENDENT_AMBULATORY_CARE_PROVIDER_SITE_OTHER): Payer: PRIVATE HEALTH INSURANCE | Admitting: Cardiology

## 2013-09-16 ENCOUNTER — Encounter: Payer: Self-pay | Admitting: Cardiology

## 2013-09-16 VITALS — BP 126/60 | HR 78 | Ht 59.5 in | Wt 195.0 lb

## 2013-09-16 DIAGNOSIS — E785 Hyperlipidemia, unspecified: Secondary | ICD-10-CM

## 2013-09-16 DIAGNOSIS — I2510 Atherosclerotic heart disease of native coronary artery without angina pectoris: Secondary | ICD-10-CM

## 2013-09-16 DIAGNOSIS — I10 Essential (primary) hypertension: Secondary | ICD-10-CM

## 2013-09-16 DIAGNOSIS — I209 Angina pectoris, unspecified: Secondary | ICD-10-CM

## 2013-09-16 MED ORDER — ASPIRIN EC 81 MG PO TBEC: 81.0000 mg | DELAYED_RELEASE_TABLET | Freq: Every day | ORAL | Status: DC

## 2013-09-16 MED ORDER — RANOLAZINE ER 1000 MG PO TB12
1000.0000 mg | ORAL_TABLET | Freq: Two times a day (BID) | ORAL | Status: DC
Start: 2013-09-16 — End: 2014-06-15

## 2013-09-16 NOTE — Progress Notes (Signed)
[facility], [place], [place]  [address] Phone: [phone] Fax:  [phone]  Date:  09/16/2013   ID:  [name], DOB 1951-02-25, MRN [id number]  PCP:  Reginia Naas, MD  Cardiologist:  Fransico Him, MD     History of Present Illness: [name] is a 63 y.o. female with a history of ASCAD, HTn and dyslipidemia who presents today for followup.  She is doing well.  She usually gets samples of Ranexa because she cannot afford it and she ran out of it a few months ago.  Shortly after that she started having CP and was hospitalized in January and had a cardiac cath which revealed single vessel ASCAD involving the mid LAD with a 90% mid LAD with the distal vessel filling competitvely by the IMA graft, patent LIMA to the LAD and chronically occluded SVG to the diagonal. Medical therapy was recommended.  Since the cath she has continued to have chest pain which is presumed to be due to Syndrome X.  She also complains of her heart racing.  She occasionally has some DOE.  She denies any LE edema.     Wt Readings from Last 3 Encounters:  09/16/13 195 lb (88.451 kg)  07/15/13 197 lb 3.4 oz (89.455 kg)  07/15/13 197 lb 3.4 oz (89.455 kg)     Past Medical History  Diagnosis Date  . Grave's disease   . Obesity   . High cholesterol   . Type II diabetes mellitus   . Myocardial infarction ?2008  . Anemia   . GERD (gastroesophageal reflux disease)   . ONGEXBMW(413.2)     "maybe once/month" (07/15/2013)  . Migraines     "couple times/yr"  (07/15/2013)  . Arthritis     "left hip"  (07/15/2013)  . Depression   . Retinopathy 12/08    right  . RLS (restless legs syndrome)   . History of ASCVD (atherosclerotic cardiovascular disease)     single vessel-Dr Radford Pax  . Osteopenia 12/2010 &01/21/2013    Minimal of hip DEXA   . Hypertension   . CAD (coronary artery disease)     a. s/p 2007  LAD w/ 3.5 x 16 mm Liberte stent b. s/p 07/2006 CABG with IMG to LAD and SVG to Diag.  c. 12/2006 diagonal branch w/ 2.5 x 12 mm Endeavor stent. d. s/p 2010 cath with small vessel disease treated with RX  e. 07/2013 s/p cath showing stable dx from 2010 with no interventions     Current Outpatient Prescriptions  Medication Sig Dispense Refill  . aspirin 325 MG tablet Take 325 mg by mouth daily.      Marland Kitchen atorvastatin (LIPITOR) 80 MG tablet Take 80 mg by mouth every evening.      . escitalopram (LEXAPRO) 5 MG tablet Take 5 mg by mouth daily.      . hydrochlorothiazide (HYDRODIURIL) 25 MG tablet Take 25 mg by mouth daily.      Marland Kitchen lisinopril (PRINIVIL,ZESTRIL) 5 MG tablet Take 5 mg by mouth daily.      . metFORMIN (GLUCOPHAGE) 1000 MG tablet Take 1 tablet (1,000 mg total) by mouth 2 (two) times daily with a meal.  30 tablet  6  . metoprolol tartrate (LOPRESSOR) 25 MG tablet Take 25 mg by mouth 2 (two) times daily.      . nitroGLYCERIN (NITROSTAT) 0.4 MG SL tablet Place 0.4 mg under the tongue every 5 (five) minutes as needed for chest pain.      Marland Kitchen  omeprazole (PRILOSEC) 20 MG capsule Take 20 mg by mouth daily.      . ondansetron (ZOFRAN) 4 MG tablet Take 4 mg by mouth every 8 (eight) hours as needed for nausea or vomiting.      . ranolazine (RANEXA) 1000 MG SR tablet Take 500 mg by mouth 2 (two) times daily.      . STUDY MEDICATION Take 1 tablet by mouth daily. Evacetrapib (CETP inhibitor) lipid lowering study with [employer]       No current facility-administered medications for this visit.    Allergies:   No Known Allergies  Social History:  The patient  reports that she quit smoking about 24 years ago. She has never used smokeless tobacco. She reports that she does not drink alcohol or use illicit drugs.   Family History:  The patient's family history includes Alzheimer's disease in her maternal grandmother; Cancer in her father and mother; Diabetes in her maternal grandmother and mother; Hypertension in her mother.   ROS:  Please see the history of present illness.      All other  systems reviewed and negative.   PHYSICAL EXAM: VS:  BP 126/60  Pulse 78  Ht 4' 11.5" (1.511 m)  Wt 195 lb (88.451 kg)  BMI 38.74 kg/m2 Well nourished, well developed, in no acute distress HEENT: normal Neck: no JVD Cardiac:  normal S1, S2; RRR; no murmur Lungs:  clear to auscultation bilaterally, no wheezing, rhonchi or rales Abd: soft, nontender, no hepatomegaly Ext: no edema Skin: warm and dry Neuro:  CNs 2-12 intact, no focal abnormalities noted  EKG:  NSR     ASSESSMENT AND PLAN:  1. ASCAD with microvascular angina that has persisted since running out of her Ranexa.  Cath a few months ago showed patent IMA to LAD with good distal flow and 90% mid LAD lesion and nonobstructive disease of the RCA and chronically occluded SVG to left circ with small native LCx.   - restart Ranexa 100mg  BID - continue metoprolol - decrease ASA to 81mg  daily 2.  HTN - controlled - continue metoprolol/Lisinopril/HCTZ 3.  Dyslipidemia - continue study drug  Followup with my NP in 2 weeks to make sure CP has improved back on Ranexa - if she is still having CP may need to add back a long acting nitrate Followup with me in 6 month  Signed, Fransico Him, MD 09/16/2013 10:57 AM

## 2013-09-16 NOTE — Patient Instructions (Addendum)
Your physician has recommended you make the following change in your medication: 1. Decrease Aspirin to 81 MG 1 tablet daily 2. Continue Ranexa 1,000 MG 1 tablet twice  A day  Samples of Ranexa given to you today that will last a month. Call us once you start your last pack from more samples to pick up at our office.   Your physician recommends that you schedule a follow-up appointment in: 2 Weeks with NP or PA  Your physician wants you to follow-up in: 6 months with Dr Mallie Snooks will receive a reminder letter in the mail two months in advance. If you don't receive a letter, please call our office to schedule the follow-up appointment.

## 2013-10-01 ENCOUNTER — Encounter: Payer: Self-pay | Admitting: Physician Assistant

## 2013-10-01 ENCOUNTER — Ambulatory Visit (INDEPENDENT_AMBULATORY_CARE_PROVIDER_SITE_OTHER): Payer: PRIVATE HEALTH INSURANCE | Admitting: Physician Assistant

## 2013-10-01 VITALS — BP 126/68 | HR 65 | Wt 197.0 lb

## 2013-10-01 DIAGNOSIS — E785 Hyperlipidemia, unspecified: Secondary | ICD-10-CM

## 2013-10-01 DIAGNOSIS — R079 Chest pain, unspecified: Secondary | ICD-10-CM

## 2013-10-01 DIAGNOSIS — I2510 Atherosclerotic heart disease of native coronary artery without angina pectoris: Secondary | ICD-10-CM

## 2013-10-01 DIAGNOSIS — I10 Essential (primary) hypertension: Secondary | ICD-10-CM

## 2013-10-01 MED ORDER — ISOSORBIDE MONONITRATE ER 30 MG PO TB24: 30.0000 mg | ORAL_TABLET | Freq: Every day | ORAL | Status: DC

## 2013-10-01 NOTE — Patient Instructions (Addendum)
YOU HAVE BEEN GIVEN RANEXA 1000MG  SAMPLES TODAY  Your physician has recommended you make the following change in your medication:  1)START IMDUR 30MG  1/2 TABLET(15MG ) DAILY FOR 3 DAYS. THEN 30MG  DAILY  TAKE ALL OTHER MEDICATION AS PRESCRIBED  YOU HAVE A FOLLOW UP APPT SCHEDULED FOR 10/23/13 @ 8:50AM WITH Margaret Pyle

## 2013-10-01 NOTE — Progress Notes (Signed)
[place], [place], [place]  [address] Phone: [phone] Fax:  [phone]  Date:  10/01/2013   ID:  [name], DOB 18-Sep-1950, MRN [id number]  PCP:  Reginia Naas, MD  Cardiologist:  Fransico Him, MD     History of Present Illness: [name] is a 63 y.o. female with a history of ASCAD, HTN and dyslipidemia who presents today for followup.   She usually gets samples of Ranexa because she cannot afford it and she ran out of it a few months ago.  Shortly after that she started having CP and was hospitalized in January and had a cardiac cath which revealed single vessel ASCAD involving the mid LAD with a 90% mid LAD with the distal vessel filling competitvely by the IMA graft, patent LIMA to the LAD and chronically occluded SVG to the diagonal. Medical therapy was recommended.  Since the cath she continued to have chest pain which was presumed to be due to Syndrome X.  She also complained of her heart racing.   She saw Dr. Fransico Him 09/16/13 in follow up.  Ranexa was resumed at 1000 mg bid.  She returns for further management of her chronic angina.  She is doing better since resuming Ranexa. She continues to have occasional interscapular back pain with activity. She also has occasional sharp chest pains with rest and activity. She does have occasional dyspnea with exertion. There are times she can exert herself without dyspnea. She denies any significant cough or any wheezing. She sleeps on 2 pillows chronically. She denies PND or edema. She denies syncope. Overall, she is NYHA class 2-2b.  Studies:  - Echo (12/03/08): EF 55-60%, normal wall motion, grade 1 diastolic dysfunction, mild aortic sclerosis without stenosis  - LHC (07/14/13): Mid LAD stent, mid LAD 90%, proximal RCA less than 20%, SVG-diagonal occluded, LIMA-LAD patent, EF 55-65%.  Recent Labs: 07/14/2013: ALT 12; AST 13; BUN 18; Creatinine 0.95; Potassium 4.8; Sodium 139; TSH 0.402  07/15/2013:  Hemoglobin 10.6*    Wt Readings from Last 3 Encounters:  10/01/13 197 lb (89.359 kg)  09/16/13 195 lb (88.451 kg)  07/15/13 197 lb 3.4 oz (89.455 kg)     Past Medical History  Diagnosis Date  . Grave's disease   . Obesity   . High cholesterol   . Type II diabetes mellitus   . Myocardial infarction ?2008  . Anemia   . GERD (gastroesophageal reflux disease)   . SWFUXNAT(557.3)     "maybe once/month" (07/15/2013)  . Migraines     "couple times/yr"  (07/15/2013)  . Arthritis     "left hip"  (07/15/2013)  . Depression   . Retinopathy 12/08    right  . RLS (restless legs syndrome)   . History of ASCVD (atherosclerotic cardiovascular disease)     single vessel-Dr Radford Pax  . Osteopenia 12/2010 &01/21/2013    Minimal of hip DEXA   . Hypertension   . CAD (coronary artery disease)     a. s/p 2007  LAD w/ 3.5 x 16 mm Liberte stent b. s/p 07/2006 CABG with IMG to LAD and SVG to Diag. c. 12/2006 diagonal branch w/ 2.5 x 12 mm Endeavor stent. d. s/p 2010 cath with small vessel disease treated with RX  e. 07/2013 s/p cath showing stable dx from 2010 with no interventions     Current Outpatient Prescriptions  Medication Sig Dispense Refill  . aspirin EC 81 MG tablet Take 1 tablet (81 mg total)  by mouth daily.      Marland Kitchen atorvastatin (LIPITOR) 80 MG tablet Take 80 mg by mouth every evening.      . escitalopram (LEXAPRO) 5 MG tablet Take 5 mg by mouth daily.      . hydrochlorothiazide (HYDRODIURIL) 25 MG tablet Take 25 mg by mouth daily.      Marland Kitchen lisinopril (PRINIVIL,ZESTRIL) 5 MG tablet Take 5 mg by mouth daily.      . metFORMIN (GLUCOPHAGE) 1000 MG tablet Take 1 tablet (1,000 mg total) by mouth 2 (two) times daily with a meal.  30 tablet  6  . metoprolol tartrate (LOPRESSOR) 25 MG tablet Take 25 mg by mouth 2 (two) times daily.      . nitroGLYCERIN (NITROSTAT) 0.4 MG SL tablet Place 0.4 mg under the tongue every 5 (five) minutes as needed for chest pain.      Marland Kitchen omeprazole (PRILOSEC) 20 MG capsule  Take 20 mg by mouth daily.      . ondansetron (ZOFRAN) 4 MG tablet Take 4 mg by mouth every 8 (eight) hours as needed for nausea or vomiting.      . ranolazine (RANEXA) 1000 MG SR tablet Take 1 tablet (1,000 mg total) by mouth 2 (two) times daily.      . STUDY MEDICATION Take 1 tablet by mouth daily. Evacetrapib (CETP inhibitor) lipid lowering study with [facility]       No current facility-administered medications for this visit.    Allergies:   No Known Allergies  Social History:  The patient  reports that she quit smoking about 24 years ago. She has never used smokeless tobacco. She reports that she does not drink alcohol or use illicit drugs.   Family History:  The patient's family history includes Alzheimer's disease in her maternal grandmother; Cancer in her father and mother; Diabetes in her maternal grandmother and mother; Hypertension in her mother.   ROS:  Please see the history of present illness.      All other systems reviewed and negative.   PHYSICAL EXAM: VS:  BP 126/68  Pulse 65  Wt 197 lb (89.359 kg) Well nourished, well developed, in no acute distress HEENT: normal Neck: no JVD Cardiac:  normal S1, S2; RRR; no murmur Lungs:  clear to auscultation bilaterally, no wheezing, rhonchi or rales Abd: soft, nontender, no hepatomegaly Ext: no edema Skin: warm and dry Neuro:  CNs 2-12 intact, no focal abnormalities noted  EKG:   NSR, HR 65, normal axis, nonspecific ST-T wave changes, QTc 413    ASSESSMENT AND PLAN:  1. CAD s/p CABG:  She has microvascular angina that has improved since resuming Ranexa.  I will add Imdur back at 15 mg QD x 3 days, then 30 mg QD.  Continue Ranexa, metoprolol, ASA, study drug.  2. HTN:  Controlled.  Continue metoprolol/Lisinopril/HCTZ. 3. Dyslipidemia:  Continue study drug. 4. Disposition:  F/u with me in 3-4 weeks.   Signed, Richardson Dopp, PA-C   10/01/2013 9:14 AM

## 2013-10-23 ENCOUNTER — Encounter: Payer: Self-pay | Admitting: Physician Assistant

## 2013-10-23 ENCOUNTER — Ambulatory Visit (INDEPENDENT_AMBULATORY_CARE_PROVIDER_SITE_OTHER): Payer: PRIVATE HEALTH INSURANCE | Admitting: Physician Assistant

## 2013-10-23 VITALS — BP 118/60 | HR 65 | Ht 59.5 in | Wt 199.0 lb

## 2013-10-23 DIAGNOSIS — I2510 Atherosclerotic heart disease of native coronary artery without angina pectoris: Secondary | ICD-10-CM

## 2013-10-23 DIAGNOSIS — I10 Essential (primary) hypertension: Secondary | ICD-10-CM

## 2013-10-23 DIAGNOSIS — E785 Hyperlipidemia, unspecified: Secondary | ICD-10-CM

## 2013-10-23 NOTE — Progress Notes (Signed)
[place], [place], [place]  [address] Phone: [phone] Fax:  [phone]  Date:  10/23/2013   ID:  [name], DOB 09/04/1950, MRN [id number]  PCP:  Reginia Naas, MD  Cardiologist:  Fransico Him, MD     History of Present Illness: [name] is a 64 y.o. female with a hx of ASCAD, HTN and dyslipidemia.   She usually gets samples of Ranexa because she cannot afford it and she ran out of it a few months ago.  Shortly after that she started having CP and was hospitalized in January and had a cardiac cath which revealed single vessel ASCAD involving the mid LAD with a 90% mid LAD with the distal vessel filling competitvely by the IMA graft, patent LIMA to the LAD and chronically occluded SVG to the diagonal. Medical therapy was recommended.  Since the cath she continued to have chest pain which was presumed to be due to Syndrome X.  She also complained of her heart racing.   She saw Dr. Radford Pax and Ranexa was resumed at 1000 mg bid.  I saw her 10/01/13 in f/u and she had significant improvement.  She continued to have some symptoms of exertional interscapular pain.  I placed her on Isosorbide and asked her to return today.    She is feeling better. She continues to have occasional episodes of interscapular back pain and chest discomfort with activity. She probably describes CCS class II angina. She feels that she is back to her baseline. She continues to have chronic dyspnea on exertion. There has been no significant change. She sleeps on 2 pillows chronically without change. She denies PND or significant LE edema. She denies syncope.   Studies:  - Echo (12/03/08): EF 55-60%, normal wall motion, grade 1 diastolic dysfunction, mild aortic sclerosis without stenosis  - LHC (07/14/13): Mid LAD stent, mid LAD 90%, proximal RCA less than 20%, SVG-diagonal occluded, LIMA-LAD patent, EF 55-65%.  Recent Labs: 07/14/2013: ALT 12; AST 13; BUN 18; Creatinine 0.95; Potassium  4.8; Sodium 139; TSH 0.402  07/15/2013: Hemoglobin 10.6*    Wt Readings from Last 3 Encounters:  10/23/13 199 lb (90.266 kg)  10/01/13 197 lb (89.359 kg)  09/16/13 195 lb (88.451 kg)     Past Medical History  Diagnosis Date  . Grave's disease   . Obesity   . High cholesterol   . Type II diabetes mellitus   . Myocardial infarction ?2008  . Anemia   . GERD (gastroesophageal reflux disease)   . GGYIRSWN(462.7)     "maybe once/month" (07/15/2013)  . Migraines     "couple times/yr"  (07/15/2013)  . Arthritis     "left hip"  (07/15/2013)  . Depression   . Retinopathy 12/08    right  . RLS (restless legs syndrome)   . History of ASCVD (atherosclerotic cardiovascular disease)     single vessel-Dr Radford Pax  . Osteopenia 12/2010 &01/21/2013    Minimal of hip DEXA   . Hypertension   . CAD (coronary artery disease)     a. s/p 2007  LAD w/ 3.5 x 16 mm Liberte stent b. s/p 07/2006 CABG with IMG to LAD and SVG to Diag. c. 12/2006 diagonal branch w/ 2.5 x 12 mm Endeavor stent. d. s/p 2010 cath with small vessel disease treated with RX  e. 07/2013 s/p cath showing stable dx from 2010 with no interventions     Current Outpatient Prescriptions  Medication Sig Dispense Refill  .  aspirin EC 81 MG tablet Take 1 tablet (81 mg total) by mouth daily.      Marland Kitchen atorvastatin (LIPITOR) 80 MG tablet Take 80 mg by mouth every evening.      . calcium carbonate (OS-CAL) 600 MG TABS tablet Take 600 mg by mouth 2 (two) times daily with a meal.      . escitalopram (LEXAPRO) 5 MG tablet Take 5 mg by mouth daily.      . hydrochlorothiazide (HYDRODIURIL) 25 MG tablet Take 25 mg by mouth daily.      . isosorbide mononitrate (IMDUR) 30 MG 24 hr tablet Take 1 tablet (30 mg total) by mouth daily.  30 tablet  11  . lisinopril (PRINIVIL,ZESTRIL) 5 MG tablet Take 5 mg by mouth daily.      . metFORMIN (GLUCOPHAGE) 1000 MG tablet Take 1 tablet (1,000 mg total) by mouth 2 (two) times daily with a meal.  30 tablet  6  .  metoprolol tartrate (LOPRESSOR) 25 MG tablet Take 25 mg by mouth 2 (two) times daily.      . nitroGLYCERIN (NITROSTAT) 0.4 MG SL tablet Place 0.4 mg under the tongue every 5 (five) minutes as needed for chest pain.      Marland Kitchen omeprazole (PRILOSEC) 40 MG capsule Take 40 mg by mouth daily.      . ondansetron (ZOFRAN) 4 MG tablet Take 4 mg by mouth every 8 (eight) hours as needed for nausea or vomiting.      . ranolazine (RANEXA) 1000 MG SR tablet Take 1 tablet (1,000 mg total) by mouth 2 (two) times daily.      . STUDY MEDICATION Take 1 tablet by mouth daily. Evacetrapib (CETP inhibitor) lipid lowering study with [facility]       No current facility-administered medications for this visit.    Allergies:   No Known Allergies  Social History:  The patient  reports that she quit smoking about 24 years ago. She has never used smokeless tobacco. She reports that she does not drink alcohol or use illicit drugs.   Family History:  The patient's family history includes Alzheimer's disease in her maternal grandmother; Cancer in her father and mother; Diabetes in her maternal grandmother and mother; Hypertension in her mother.   ROS:  Please see the history of present illness.     All other systems reviewed and negative.   PHYSICAL EXAM: VS:  BP 118/60  Pulse 65  Ht 4' 11.5" (1.511 m)  Wt 199 lb (90.266 kg)  BMI 39.54 kg/m2 Well nourished, well developed, in no acute distress HEENT: normal Neck: no JVD Cardiac:  normal S1, S2; RRR; no murmur Lungs:  clear to auscultation bilaterally, no wheezing, rhonchi or rales Abd: soft, nontender, no hepatomegaly Ext: no edema Skin: warm and dry Neuro:  CNs 2-12 intact, no focal abnormalities noted   EKG:   NSR, HR 65, normal axis, no ST changes   ASSESSMENT AND PLAN:  1. CAD s/p CABG:  She has chronic, stable angina. This is improved on her current regimen. We had a long discussion regarding whether to adjust her isosorbide further versus continue her  current therapy. She prefers to continue current therapy.  Continue isosorbide, Ranexa, metoprolol, ASA, study drug.  2. HTN:  Controlled.  Continue metoprolol/Lisinopril/HCTZ. 3. Dyslipidemia:  Continue study drug. 4. Disposition:  F/u with Dr. Radford Pax in 6 months.   Signed, Richardson Dopp, PA-C   10/23/2013 9:14 AM

## 2013-10-23 NOTE — Patient Instructions (Signed)
Your physician recommends that you continue on your current medications as directed. Please refer to the Current Medication list given to you today.   Your physician wants you to follow-up in:  6 MONTHS WITH DR TURNER.. You will receive a reminder letter in the mail two months in advance. If you don't receive a letter, please call our office to schedule the follow-up appointment.  

## 2013-11-13 ENCOUNTER — Telehealth: Payer: Self-pay

## 2013-11-13 NOTE — Telephone Encounter (Signed)
Patient called for samples of ranexa placed samples up front 

## 2013-12-18 ENCOUNTER — Telehealth: Payer: Self-pay | Admitting: *Deleted

## 2013-12-18 NOTE — Telephone Encounter (Signed)
Patient requests ranexa samples. I will place at the front desk for pick up.

## 2014-01-26 ENCOUNTER — Telehealth: Payer: Self-pay | Admitting: *Deleted

## 2014-01-26 NOTE — Telephone Encounter (Signed)
Ranexa samples placed at the front desk for pick up.

## 2014-02-19 ENCOUNTER — Telehealth: Payer: Self-pay | Admitting: *Deleted

## 2014-02-19 NOTE — Telephone Encounter (Signed)
Ranexa samples placed at the front desk for pick up.

## 2014-03-16 ENCOUNTER — Ambulatory Visit (INDEPENDENT_AMBULATORY_CARE_PROVIDER_SITE_OTHER): Payer: PRIVATE HEALTH INSURANCE | Admitting: Cardiology

## 2014-03-16 ENCOUNTER — Encounter: Payer: Self-pay | Admitting: General Surgery

## 2014-03-16 ENCOUNTER — Encounter: Payer: Self-pay | Admitting: Cardiology

## 2014-03-16 VITALS — BP 116/68 | HR 67 | Ht 59.5 in | Wt 197.0 lb

## 2014-03-16 DIAGNOSIS — E785 Hyperlipidemia, unspecified: Secondary | ICD-10-CM

## 2014-03-16 DIAGNOSIS — I10 Essential (primary) hypertension: Secondary | ICD-10-CM

## 2014-03-16 DIAGNOSIS — I209 Angina pectoris, unspecified: Secondary | ICD-10-CM

## 2014-03-16 DIAGNOSIS — E119 Type 2 diabetes mellitus without complications: Secondary | ICD-10-CM

## 2014-03-16 DIAGNOSIS — I25810 Atherosclerosis of coronary artery bypass graft(s) without angina pectoris: Secondary | ICD-10-CM

## 2014-03-16 LAB — BASIC METABOLIC PANEL
BUN: 20 mg/dL (ref 6–23)
CALCIUM: 8.6 mg/dL (ref 8.4–10.5)
CO2: 24 mEq/L (ref 19–32)
CREATININE: 1.1 mg/dL (ref 0.4–1.2)
Chloride: 104 mEq/L (ref 96–112)
GFR: 54.78 mL/min — AB (ref >60.00)
GLUCOSE: 103 mg/dL — AB (ref 70–99)
Potassium: 4.8 mEq/L (ref 3.5–5.1)
Sodium: 137 mEq/L (ref 135–145)

## 2014-03-16 NOTE — Progress Notes (Signed)
[name], [name], [name]  [address] Phone: [phone] Fax:  [phone]  Date:  03/16/2014   ID:  [name], DOB Nov 30, 1950, MRN [id number]  PCP:  Reginia Naas, MD  Cardiologist:  Fransico Him, MD     History of Present Illness: [name] is a 63 y.o. female with a hx of ASCAD, HTN and dyslipidemia. She usually gets samples of Ranexa because she cannot afford it and she ran out of it and she started having CP and was hospitalized in January and had a cardiac cath which revealed single vessel ASCAD involving the mid LAD with a 90% mid LAD with the distal vessel filling competitvely by the IMA graft, patent LIMA to the LAD and chronically occluded SVG to the diagonal. Medical therapy was recommended. Since the cath she continued to have chest pain which was presumed to be due to Syndrome X. She is feeling better. She continues to have occasional episodes of interscapular back pain and chest discomfort with activity. She probably describes CCS class II angina. She feels that she is back to her baseline. She continues to have chronic dyspnea on exertion which is at her baseline.  She sleeps on 2 pillows chronically without change. She denies PND or significant LE edema. She denies syncope.      Wt Readings from Last 3 Encounters:  03/16/14 197 lb (89.359 kg)  10/23/13 199 lb (90.266 kg)  10/01/13 197 lb (89.359 kg)     Past Medical History  Diagnosis Date  . Grave's disease   . Obesity   . High cholesterol   . Type II diabetes mellitus   . Myocardial infarction ?2008  . Anemia   . GERD (gastroesophageal reflux disease)   . IWPYKDXI(338.2)     "maybe once/month" (07/15/2013)  . Migraines     "couple times/yr"  (07/15/2013)  . Arthritis     "left hip"  (07/15/2013)  . Depression   . Retinopathy 12/08    right  . RLS (restless legs syndrome)   . History of ASCVD (atherosclerotic cardiovascular disease)     single vessel-Dr Radford Pax  .  Osteopenia 12/2010 &01/21/2013    Minimal of hip DEXA   . Hypertension   . CAD (coronary artery disease)     a. s/p 2007  LAD w/ 3.5 x 16 mm Liberte stent b. s/p 07/2006 CABG with IMG to LAD and SVG to Diag. c. 12/2006 diagonal branch w/ 2.5 x 12 mm Endeavor stent. d. s/p 2010 cath with small vessel disease treated with RX  e. 07/2013 s/p cath showing stable dx from 2010 with no interventions     Current Outpatient Prescriptions  Medication Sig Dispense Refill  . aspirin EC 81 MG tablet Take 1 tablet (81 mg total) by mouth daily.      Marland Kitchen atorvastatin (LIPITOR) 80 MG tablet Take 80 mg by mouth every evening.      . calcium carbonate (OS-CAL) 600 MG TABS tablet Take 600 mg by mouth 2 (two) times daily with a meal.      . escitalopram (LEXAPRO) 5 MG tablet Take 5 mg by mouth daily.      . hydrochlorothiazide (HYDRODIURIL) 25 MG tablet Take 25 mg by mouth daily.      . isosorbide mononitrate (IMDUR) 30 MG 24 hr tablet Take 1 tablet (30 mg total) by mouth daily.  30 tablet  11  . lisinopril (PRINIVIL,ZESTRIL) 5 MG tablet Take 5 mg by mouth daily.      Marland Kitchen  metFORMIN (GLUCOPHAGE) 1000 MG tablet Take 1 tablet (1,000 mg total) by mouth 2 (two) times daily with a meal.  30 tablet  6  . metoprolol tartrate (LOPRESSOR) 25 MG tablet Take 25 mg by mouth 2 (two) times daily.      . nitroGLYCERIN (NITROSTAT) 0.4 MG SL tablet Place 0.4 mg under the tongue every 5 (five) minutes as needed for chest pain.      Marland Kitchen omeprazole (PRILOSEC) 40 MG capsule Take 40 mg by mouth daily.      . ranolazine (RANEXA) 1000 MG SR tablet Take 1 tablet (1,000 mg total) by mouth 2 (two) times daily.      . STUDY MEDICATION Take 1 tablet by mouth daily. Evacetrapib (CETP inhibitor) lipid lowering study with [facility]      . ondansetron (ZOFRAN) 4 MG tablet Take 4 mg by mouth every 8 (eight) hours as needed for nausea or vomiting.       No current facility-administered medications for this visit.    Allergies:   No Known  Allergies  Social History:  The patient  reports that she quit smoking about 24 years ago. She has never used smokeless tobacco. She reports that she does not drink alcohol or use illicit drugs.   Family History:  The patient's family history includes Alzheimer's disease in her maternal grandmother; Cancer in her father and mother; Diabetes in her maternal grandmother and mother; Hypertension in her mother.   ROS:  Please see the history of present illness.      All other systems reviewed and negative.   PHYSICAL EXAM: VS:  BP 116/68  Pulse 67  Ht 4' 11.5" (1.511 m)  Wt 197 lb (89.359 kg)  BMI 39.14 kg/m2 Well nourished, well developed, in no acute distress HEENT: normal Neck: no JVD Cardiac:  normal S1, S2; RRR; no murmur Lungs:  clear to auscultation bilaterally, no wheezing, rhonchi or rales Abd: soft, nontender, no hepatomegaly Ext: no edema Skin: warm and dry Neuro:  CNs 2-12 intact, no focal abnormalities noted   ASSESSMENT AND PLAN:  1.  CAD s/p CABG: She has chronic, stable angina. This is improved on her current regimen. Continue Ranexa, metoprolol, ASA.  I will increase her Imdur to 60mg  daily to see if it helps with her CP 2.  HTN: Controlled. Continue metoprolol/Lisinopril/HCTZ. - check BMET 3.  Dyslipidemia: Continue study drug and Lipitor 4.  DM type II - follow with PCP  Followup with me in 6 months      Signed, Fransico Him, MD 03/16/2014 9:20 AM

## 2014-03-16 NOTE — Patient Instructions (Signed)
Your physician recommends that you continue on your current medications as directed. Please refer to the Current Medication list given to you today.  Your physician recommends that you go to the lab today for a BMET  Your physician recommends that you schedule a follow-up appointment in: 3 months with Dr Turner 

## 2014-05-24 ENCOUNTER — Telehealth: Payer: Self-pay | Admitting: Cardiology

## 2014-05-24 DIAGNOSIS — E785 Hyperlipidemia, unspecified: Secondary | ICD-10-CM

## 2014-05-24 NOTE — Telephone Encounter (Signed)
Patient just completed lipid study.  It is recommended by Dr. Valere Dross that we check that patient's FLP and ALT in 3 months to determine what therapy going forward we need.

## 2014-05-26 ENCOUNTER — Telehealth: Payer: Self-pay | Admitting: Cardiology

## 2014-05-26 NOTE — Telephone Encounter (Signed)
New Msg   Patient returning call, please contact at [phone].

## 2014-05-26 NOTE — Telephone Encounter (Signed)
F/u   Pt returning a call.

## 2014-05-26 NOTE — Telephone Encounter (Signed)
Left message to call back  

## 2014-05-26 NOTE — Addendum Note (Signed)
Addended by: Harland German A on: 05/26/2014 01:07 PM   Modules accepted: Orders

## 2014-05-26 NOTE — Telephone Encounter (Signed)
Spoke with patient. Confirmed fasting lab appointment for 08/26/14.

## 2014-05-26 NOTE — Telephone Encounter (Signed)
Spoke with patient. Confirmed fasting lab appointment for 08/26/14.  Addressed in other phone note.

## 2014-06-10 ENCOUNTER — Encounter (HOSPITAL_COMMUNITY): Payer: Self-pay | Admitting: Cardiology

## 2014-06-15 ENCOUNTER — Encounter: Payer: Self-pay | Admitting: Cardiology

## 2014-06-15 ENCOUNTER — Ambulatory Visit (INDEPENDENT_AMBULATORY_CARE_PROVIDER_SITE_OTHER): Payer: PRIVATE HEALTH INSURANCE | Admitting: Cardiology

## 2014-06-15 VITALS — BP 118/70 | HR 81 | Ht 59.5 in | Wt 194.2 lb

## 2014-06-15 DIAGNOSIS — E785 Hyperlipidemia, unspecified: Secondary | ICD-10-CM

## 2014-06-15 DIAGNOSIS — I10 Essential (primary) hypertension: Secondary | ICD-10-CM

## 2014-06-15 DIAGNOSIS — I2583 Coronary atherosclerosis due to lipid rich plaque: Principal | ICD-10-CM

## 2014-06-15 DIAGNOSIS — I2510 Atherosclerotic heart disease of native coronary artery without angina pectoris: Secondary | ICD-10-CM

## 2014-06-15 NOTE — Patient Instructions (Signed)
Low-Sodium Eating Plan Sodium raises blood pressure and causes water to be held in the body. Getting less sodium from food will help lower your blood pressure, reduce any swelling, and protect your heart, liver, and kidneys. We get sodium by adding salt (sodium chloride) to food. Most of our sodium comes from canned, boxed, and frozen foods. Restaurant foods, fast foods, and pizza are also very high in sodium. Even if you take medicine to lower your blood pressure or to reduce fluid in your body, getting less sodium from your food is important.  WHAT DO I NEED TO KNOW ABOUT THIS EATING PLAN? For the low-sodium eating plan, you will follow these general guidelines:  Choose foods with a % Daily Value for sodium of less than 5% (as listed on the food label).   Use salt-free seasonings or herbs instead of table salt or sea salt.   Check with your health care provider or pharmacist before using salt substitutes.   Eat fresh foods.  Eat more vegetables and fruits.  Limit canned vegetables. If you do use them, rinse them well to decrease the sodium.   Limit cheese to 1 oz (28 g) per day.   Eat lower-sodium products, often labeled as "lower sodium" or "no salt added."  Avoid foods that contain monosodium glutamate (MSG). MSG is sometimes added to [place] food and some canned foods.  Check food labels (Nutrition Facts labels) on foods to learn how much sodium is in one serving.  Eat more home-cooked food and less restaurant, buffet, and fast food.  When eating at a restaurant, ask that your food be prepared with less salt or none, if possible.  HOW DO I READ FOOD LABELS FOR SODIUM INFORMATION? The Nutrition Facts label lists the amount of sodium in one serving of the food. If you eat more than one serving, you must multiply the listed amount of sodium by the number of servings. Food labels may also identify foods as:  Sodium free--Less than 5 mg in a serving.  Very low sodium--35  mg or less in a serving.  Low sodium--140 mg or less in a serving.  Light in sodium--50% less sodium in a serving. For example, if a food that usually has 300 mg of sodium is changed to become light in sodium, it will have 150 mg of sodium.  Reduced sodium--25% less sodium in a serving. For example, if a food that usually has 400 mg of sodium is changed to reduced sodium, it will have 300 mg of sodium. WHAT FOODS CAN I EAT? Grains Low-sodium cereals, including oats, puffed wheat and rice, and shredded wheat cereals. Low-sodium crackers. Unsalted rice and pasta. Lower-sodium bread.  Vegetables Frozen or fresh vegetables. Low-sodium or reduced-sodium canned vegetables. Low-sodium or reduced-sodium tomato sauce and paste. Low-sodium or reduced-sodium tomato and vegetable juices.  Fruits Fresh, frozen, and canned fruit. Fruit juice.  Meat and Other Protein Products Low-sodium canned tuna and salmon. Fresh or frozen meat, poultry, seafood, and fish. Lamb. Unsalted nuts. Dried beans, peas, and lentils without added salt. Unsalted canned beans. Homemade soups without salt. Eggs.  Dairy Milk. Soy milk. Ricotta cheese. Low-sodium or reduced-sodium cheeses. Yogurt.  Condiments Fresh and dried herbs and spices. Salt-free seasonings. Onion and garlic powders. Low-sodium varieties of mustard and ketchup. Lemon juice.  Fats and Oils Reduced-sodium salad dressings. Unsalted butter.  Other Unsalted popcorn and pretzels.  The items listed above may not be a complete list of recommended foods or beverages. Contact your dietitian for  more options. WHAT FOODS ARE NOT RECOMMENDED? Grains Instant hot cereals. Bread stuffing, pancake, and biscuit mixes. Croutons. Seasoned rice or pasta mixes. Noodle soup cups. Boxed or frozen macaroni and cheese. Self-rising flour. Regular salted crackers. Vegetables Regular canned vegetables. Regular canned tomato sauce and paste. Regular tomato and vegetable  juices. Frozen vegetables in sauces. Salted french fries. Olives. Angie Fava. Relishes. Sauerkraut. Salsa. Meat and Other Protein Products Salted, canned, smoked, spiced, or pickled meats, seafood, or fish. Bacon, ham, sausage, hot dogs, corned beef, chipped beef, and packaged luncheon meats. Salt pork. Jerky. Pickled herring. Anchovies, regular canned tuna, and sardines. Salted nuts. Dairy Processed cheese and cheese spreads. Cheese curds. Blue cheese and cottage cheese. Buttermilk.  Condiments Onion and garlic salt, seasoned salt, table salt, and sea salt. Canned and packaged gravies. Worcestershire sauce. Tartar sauce. Barbecue sauce. Teriyaki sauce. Soy sauce, including reduced sodium. Steak sauce. Fish sauce. Oyster sauce. Cocktail sauce. Horseradish. Regular ketchup and mustard. Meat flavorings and tenderizers. Bouillon cubes. Hot sauce. Tabasco sauce. Marinades. Taco seasonings. Relishes. Fats and Oils Regular salad dressings. Salted butter. Margarine. Ghee. Bacon fat.  Other Potato and tortilla chips. Corn chips and puffs. Salted popcorn and pretzels. Canned or dried soups. Pizza. Frozen entrees and pot pies.  The items listed above may not be a complete list of foods and beverages to avoid. Contact your dietitian for more information. Document Released: 12/08/2001 Document Revised: 06/23/2013 Document Reviewed: 04/22/2013 [facility] Patient Information 2015 [place], [place]. This information is not intended to replace advice given to you by your health care provider. Make sure you discuss any questions you have with your health care provider.   Your physician wants you to follow-up in: 6 months with Dr. Radford Pax. You will receive a reminder letter in the mail two months in advance. If you don't receive a letter, please call our office to schedule the follow-up appointment.

## 2014-06-15 NOTE — Progress Notes (Signed)
[name], [name], [name]  [id number] Phone: [phone] Fax:  [phone]  Date:  06/15/2014   ID:  [name], DOB 06/18/1951, MRN [id number]  PCP:  Reginia Naas, MD  Cardiologist:  Fransico Him, MD    History of Present Illness: [name] is a 63 y.o. female with a hx of ASCAD, HTN and dyslipidemia.  Cardiac cath 07/2013 revealed single vessel ASCAD involving the mid LAD with a 90% mid LAD with the distal vessel filling competitvely by the IMA graft, patent LIMA to the LAD and chronically occluded SVG to the diagonal. She is on medical therapy.  She cannot afford Ranexa and had been getting samples but ran out and has not been using it.  She has not had any chest pain recently.  She feels that she is back to her baseline. She continues to have chronic dyspnea on exertion which is at her baseline. She sleeps on 2 pillows chronically without change. She denies PND.  She denies syncope. She occasionally has some edema in her hands.   Wt Readings from Last 3 Encounters:  06/15/14 194 lb 3.2 oz (88.089 kg)  03/16/14 197 lb (89.359 kg)  10/23/13 199 lb (90.266 kg)     Past Medical History  Diagnosis Date  . Grave's disease   . Obesity   . High cholesterol   . Type II diabetes mellitus   . Myocardial infarction ?2008  . Anemia   . GERD (gastroesophageal reflux disease)   . HYIFOYDX(412.8)     "maybe once/month" (07/15/2013)  . Migraines     "couple times/yr"  (07/15/2013)  . Arthritis     "left hip"  (07/15/2013)  . Depression   . Retinopathy 12/08    right  . RLS (restless legs syndrome)   . History of ASCVD (atherosclerotic cardiovascular disease)     single vessel-Dr Radford Pax  . Osteopenia 12/2010 &01/21/2013    Minimal of hip DEXA   . Hypertension   . CAD (coronary artery disease)     a. s/p 2007  LAD w/ 3.5 x 16 mm Liberte stent b. s/p 07/2006 CABG with IMG to LAD and SVG to Diag. c. 12/2006 diagonal branch w/ 2.5 x 12 mm Endeavor stent. d.  s/p 2010 cath with small vessel disease treated with RX  e. 07/2013 s/p cath showing stable dx from 2010 with no interventions     Current Outpatient Prescriptions  Medication Sig Dispense Refill  . aspirin EC 81 MG tablet Take 1 tablet (81 mg total) by mouth daily.    Marland Kitchen atorvastatin (LIPITOR) 80 MG tablet Take 80 mg by mouth every evening.    . escitalopram (LEXAPRO) 5 MG tablet Take 5 mg by mouth daily.    . hydrochlorothiazide (HYDRODIURIL) 25 MG tablet Take 25 mg by mouth daily.    . isosorbide mononitrate (IMDUR) 30 MG 24 hr tablet Take 1 tablet (30 mg total) by mouth daily. 30 tablet 11  . lisinopril (PRINIVIL,ZESTRIL) 5 MG tablet Take 5 mg by mouth daily.    . metFORMIN (GLUCOPHAGE) 1000 MG tablet Take 1 tablet (1,000 mg total) by mouth 2 (two) times daily with a meal. 30 tablet 6  . metoprolol tartrate (LOPRESSOR) 25 MG tablet Take 25 mg by mouth 2 (two) times daily.    . nitroGLYCERIN (NITROSTAT) 0.4 MG SL tablet Place 0.4 mg under the tongue every 5 (five) minutes as needed for chest pain.    Marland Kitchen omeprazole (PRILOSEC) 40  MG capsule Take 40 mg by mouth daily.    . ondansetron (ZOFRAN) 4 MG tablet Take 4 mg by mouth every 8 (eight) hours as needed for nausea or vomiting.     No current facility-administered medications for this visit.    Allergies:   No Known Allergies  Social History:  The patient  reports that she quit smoking about 24 years ago. She has never used smokeless tobacco. She reports that she does not drink alcohol or use illicit drugs.   Family History:  The patient's family history includes Alzheimer's disease in her maternal grandmother; Cancer in her father and mother; Diabetes in her maternal grandmother and mother; Hypertension in her mother.   ROS:  Please see the history of present illness.      All other systems reviewed and negative.   PHYSICAL EXAM: VS:  BP 118/70 mmHg  Pulse 81  Ht 4' 11.5" (1.511 m)  Wt 194 lb 3.2 oz (88.089 kg)  BMI 38.58 kg/m2  SpO2  99% Well nourished, well developed, in no acute distress HEENT: normal Neck: no JVD Cardiac:  normal S1, S2; RRR; no murmur Lungs:  clear to auscultation bilaterally, no wheezing, rhonchi or rales Abd: soft, nontender, no hepatomegaly Ext: trace edema Skin: warm and dry Neuro:  CNs 2-12 intact, no focal abnormalities noted   ASSESSMENT AND PLAN:  1. CAD s/p CABG: She has chronic, stable angina. This is improved on her current regimen. Continue metoprolol, ASA. 2. HTN: Controlled. Continue metoprolol/Lisinopril/HCTZ. 3. Dyslipidemia: Continue Lipitor.  She will have lipids checked in February. 4. DM type II - follow with PCP 5.  LE edema - I have asked her to cut out all table salt.  If her LE edema does not resolve she will call me  Followup with me in 6 months      Signed, Fransico Him, MD [facility] 06/15/2014 9:54 AM

## 2014-08-26 ENCOUNTER — Other Ambulatory Visit (INDEPENDENT_AMBULATORY_CARE_PROVIDER_SITE_OTHER): Payer: PRIVATE HEALTH INSURANCE | Admitting: *Deleted

## 2014-08-26 DIAGNOSIS — E785 Hyperlipidemia, unspecified: Secondary | ICD-10-CM

## 2014-08-26 LAB — LIPID PANEL
Cholesterol: 125 mg/dL (ref 0–200)
HDL: 38 mg/dL — AB (ref >39.00)
LDL Cholesterol: 67 mg/dL (ref 0–99)
NonHDL: 87
TRIGLYCERIDES: 102 mg/dL (ref 0.0–149.0)
Total CHOL/HDL Ratio: 3
VLDL: 20.4 mg/dL (ref 0.0–40.0)

## 2014-08-26 LAB — ALT: ALT: 9 U/L (ref 0–35)

## 2014-09-10 ENCOUNTER — Encounter: Payer: Self-pay | Admitting: Cardiology

## 2014-10-17 ENCOUNTER — Other Ambulatory Visit: Payer: Self-pay | Admitting: Physician Assistant

## 2014-11-24 ENCOUNTER — Telehealth: Payer: Self-pay | Admitting: Hematology

## 2014-11-24 NOTE — Telephone Encounter (Signed)
new patient appt-s/w patient and gave np appt for 06/21 @ 10:30 w/Dr. Burr Medico Referring Dr. Carol Ada Dx-Persistent Anemia

## 2014-12-02 NOTE — Progress Notes (Signed)
Cardiology Office Note   Date:  12/03/2014   ID:  [name], DOB 10/31/1950, MRN [id number]  PCP:  Reginia Naas, MD    Chief Complaint  Patient presents with  . Follow-up    CAD      History of Present Illness: [name] is a 63 y.o. female with a hx of ASCAD s/p remote CABG, HTN and dyslipidemia. Cardiac cath 07/2013 revealed single vessel ASCAD involving the mid LAD with a 90% mid LAD with the distal vessel filling competitvely by the IMA graft, patent LIMA to the LAD and chronically occluded SVG to the diagonal. She is on medical therapy. She has recently been having sharp pains in her chest and in her back between her shoulder blades and then at times she will feel like someone is sitting on her chest.   She continues to have chronic dyspnea on exertion which is at her baseline. She has 2 pillow orthopnea without change. She denies PND. She denies syncope, palpitations or diziness. She has LE edema at the end of the day.      Past Medical History  Diagnosis Date  . Grave's disease   . Obesity   . High cholesterol   . Type II diabetes mellitus   . Myocardial infarction ?2008  . Anemia   . GERD (gastroesophageal reflux disease)   . EYCXKGYJ(856.3)     "maybe once/month" (07/15/2013)  . Migraines     "couple times/yr"  (07/15/2013)  . Arthritis     "left hip"  (07/15/2013)  . Depression   . Retinopathy 12/08    right  . RLS (restless legs syndrome)   . History of ASCVD (atherosclerotic cardiovascular disease)     single vessel-Dr Radford Pax  . Osteopenia 12/2010 &01/21/2013    Minimal of hip DEXA   . Hypertension   . CAD (coronary artery disease)     a. s/p 2007  LAD w/ 3.5 x 16 mm Liberte stent b. s/p 07/2006 CABG with IMG to LAD and SVG to Diag. c. 12/2006 diagonal branch w/ 2.5 x 12 mm Endeavor stent. d. s/p 2010 cath with small vessel disease treated with RX  e. 07/2013 s/p cath showing stable dx from 2010 with no interventions      Past Surgical History  Procedure Laterality Date  . Laparoscopic cholecystectomy  1990's  . Tubal ligation  1976  . Coronary artery bypass graft  ?2008    s/p two vessel CABG off pump LAD diagonal and PTCA/DE Stent mid LAD/ DE stent LAD diagonal through previous stent sstruts,occluded SVG-diagonal  . Coronary angioplasty with stent placement      "I've had quite a few stents"   . Cardiac catheterization  02/03/2008    for CP patent LIMA w mid-vessel spasm.  . Colonoscopy  10/2010    rectal polyp  . Left heart catheterization with coronary angiogram N/A 07/15/2013    Procedure: LEFT HEART CATHETERIZATION WITH CORONARY ANGIOGRAM;  Surgeon: Peter M Martinique, MD;  Location[facility] CATH LAB;  Service: Cardiovascular;  Laterality: N/A;     Current Outpatient Prescriptions  Medication Sig Dispense Refill  . aspirin EC 81 MG tablet Take 1 tablet (81 mg total) by mouth daily.    Marland Kitchen atorvastatin (LIPITOR) 80 MG tablet Take 80 mg by mouth every evening.    . escitalopram (LEXAPRO) 10 MG tablet Take 10 mg by mouth daily.  1  . hydrochlorothiazide (HYDRODIURIL) 25 MG tablet Take 25 mg by mouth daily.    . isosorbide mononitrate (IMDUR) 30 MG 24 hr tablet TAKE 1 TABLET (30 MG TOTAL) BY MOUTH DAILY. 30 tablet 3  . lisinopril (PRINIVIL,ZESTRIL) 5 MG tablet Take 5 mg by mouth daily.    . metFORMIN (GLUCOPHAGE) 1000 MG tablet Take 1 tablet (1,000 mg total) by mouth 2 (two) times daily with a meal. 30 tablet 6  . metoprolol tartrate (LOPRESSOR) 25 MG tablet Take 25 mg by mouth 2 (two) times daily.    . Multiple Vitamins-Iron (MULTIVITAMIN/IRON PO) Take 1 tablet by mouth daily.    . nitroGLYCERIN (NITROSTAT) 0.4 MG SL tablet Place 0.4 mg under the tongue every 5 (five) minutes as needed for chest pain.    Marland Kitchen omeprazole (PRILOSEC) 40 MG capsule Take 40 mg by mouth daily.    . ondansetron (ZOFRAN) 4 MG tablet Take 4 mg by mouth every 8 (eight) hours as needed for nausea or vomiting.     No current  facility-administered medications for this visit.    Allergies:   Review of patient's allergies indicates no known allergies.    Social History:  The patient  reports that she quit smoking about 25 years ago. She has never used smokeless tobacco. She reports that she does not drink alcohol or use illicit drugs.   Family History:  The patient's family history includes Alzheimer's disease in her maternal grandmother; Cancer in her father and mother; Diabetes in her maternal grandmother and mother; Hypertension in her mother.    ROS:  Please see the history of present illness.   Otherwise, review of systems are positive for none.   All other systems are reviewed and negative.    PHYSICAL EXAM: VS:  BP 118/68 mmHg  Pulse 70  Ht 4' 11.5" (1.511 m)  Wt 198 lb 12.8 oz (90.175 kg)  BMI 39.50 kg/m2 , BMI Body mass index is 39.5 kg/(m^2). GEN: Well nourished, well developed, in no acute distress HEENT: normal Neck: no JVD, carotid bruits, or masses Cardiac: RRR; no murmurs, rubs, or gallops,no edema  Respiratory:  clear to auscultation bilaterally, normal work of breathing GI: soft, nontender, nondistended, + BS MS: no deformity or atrophy Skin: warm and dry, no rash Neuro:  Strength and sensation are intact Psych: euthymic mood, full affect   EKG:  EKG was ordered today and showed NSR with nonspecific T wave abnormality in V1-V2    Recent Labs: 03/16/2014: BUN 20; Creatinine 1.1; Potassium 4.8; Sodium 137 08/26/2014: ALT 9    Lipid Panel    Component Value Date/Time   CHOL 125 08/26/2014 0734   TRIG 102.0 08/26/2014 0734   HDL 38.00* 08/26/2014 0734   CHOLHDL 3 08/26/2014 0734   VLDL 20.4 08/26/2014 0734   LDLCALC 67 08/26/2014 0734      Wt Readings from Last 3 Encounters:  12/03/14 198 lb 12.8 oz (90.175 kg)  06/15/14 194 lb 3.2 oz (88.089 kg)  03/16/14 197 lb (89.359 kg)     ASSESSMENT AND PLAN:  1. CAD s/p CABG: She has chronic angina on her current regimen that  seems to have worsened. Continue metoprolol, Imdur, ASA and statin.  I will increase her Imdur to 60mg  daily. I will get a Lexiscan myoview to assess for ischemia.  2.  SOB with orthopnea which seems to be stable but  I will also check a 2D echo to assess LVF since she has not had an echo  in 6 years.  I will also check a CBC since she was told she may be anemic.  2. HTN: Controlled. Continue metoprolol/Lisinopril/HCTZ. 3. Dyslipidemia: Continue Lipitor. LDL is at goal 4. DM type II - follow with PCP 5. LE edema      Current medicines are reviewed at length with the patient today.  The patient does not have concerns regarding medicines.  The following changes have been made:  no change  Labs/ tests ordered today: See above Assessment and Plan No orders of the defined types were placed in this encounter.     Disposition:   FU with me in 6 months  Signed, Sueanne Margarita, MD  12/03/2014 9:39 AM    [facility] [address], [place], [place]  [address] Phone: [phone]; Fax: [phone]

## 2014-12-03 ENCOUNTER — Encounter: Payer: Self-pay | Admitting: Cardiology

## 2014-12-03 ENCOUNTER — Ambulatory Visit (INDEPENDENT_AMBULATORY_CARE_PROVIDER_SITE_OTHER): Payer: PRIVATE HEALTH INSURANCE | Admitting: Cardiology

## 2014-12-03 VITALS — BP 118/68 | HR 70 | Ht 59.5 in | Wt 198.8 lb

## 2014-12-03 DIAGNOSIS — R0602 Shortness of breath: Secondary | ICD-10-CM | POA: Diagnosis not present

## 2014-12-03 DIAGNOSIS — E785 Hyperlipidemia, unspecified: Secondary | ICD-10-CM

## 2014-12-03 DIAGNOSIS — I209 Angina pectoris, unspecified: Secondary | ICD-10-CM

## 2014-12-03 DIAGNOSIS — I10 Essential (primary) hypertension: Secondary | ICD-10-CM | POA: Diagnosis not present

## 2014-12-03 DIAGNOSIS — I2510 Atherosclerotic heart disease of native coronary artery without angina pectoris: Secondary | ICD-10-CM

## 2014-12-03 DIAGNOSIS — I2583 Coronary atherosclerosis due to lipid rich plaque: Principal | ICD-10-CM

## 2014-12-03 DIAGNOSIS — R079 Chest pain, unspecified: Secondary | ICD-10-CM

## 2014-12-03 LAB — CBC WITH DIFFERENTIAL/PLATELET
BASOS ABS: 0 10*3/uL (ref 0.0–0.1)
Basophils Relative: 0.1 % (ref 0.0–3.0)
EOS ABS: 0.1 10*3/uL (ref 0.0–0.7)
EOS PCT: 1.5 % (ref 0.0–5.0)
HCT: 33.1 % — ABNORMAL LOW (ref 36.0–46.0)
Hemoglobin: 10.8 g/dL — ABNORMAL LOW (ref 12.0–15.0)
LYMPHS PCT: 16.5 % (ref 12.0–46.0)
Lymphs Abs: 1.5 10*3/uL (ref 0.7–4.0)
MCHC: 32.7 g/dL (ref 30.0–36.0)
MCV: 85.3 fl (ref 78.0–100.0)
MONOS PCT: 5.1 % (ref 3.0–12.0)
Monocytes Absolute: 0.5 10*3/uL (ref 0.1–1.0)
Neutro Abs: 7.1 10*3/uL (ref 1.4–7.7)
Neutrophils Relative %: 76.8 % (ref 43.0–77.0)
PLATELETS: 230 10*3/uL (ref 150.0–400.0)
RBC: 3.88 Mil/uL (ref 3.87–5.11)
RDW: 16.7 % — ABNORMAL HIGH (ref 11.5–15.5)
WBC: 9.3 10*3/uL (ref 4.0–10.5)

## 2014-12-03 LAB — BASIC METABOLIC PANEL
BUN: 18 mg/dL (ref 6–23)
CO2: 29 mEq/L (ref 19–32)
Calcium: 9.3 mg/dL (ref 8.4–10.5)
Chloride: 102 mEq/L (ref 96–112)
Creatinine, Ser: 0.99 mg/dL (ref 0.40–1.20)
GFR: 59.79 mL/min — ABNORMAL LOW (ref >60.00)
Glucose, Bld: 132 mg/dL — ABNORMAL HIGH (ref 70–99)
Potassium: 4.6 mEq/L (ref 3.5–5.1)
Sodium: 138 mEq/L (ref 135–145)

## 2014-12-03 LAB — BRAIN NATRIURETIC PEPTIDE: Pro B Natriuretic peptide (BNP): 33 pg/mL (ref 0.0–100.0)

## 2014-12-03 NOTE — Patient Instructions (Addendum)
Medication Instructions:  Your physician recommends that you continue on your current medications as directed. Please refer to the Current Medication list given to you today.   Labwork: TODAY: BMET, CBC, BNP  Testing/Procedures: Your physician has requested that you have an echocardiogram. Echocardiography is a painless test that uses sound waves to create images of your heart. It provides your doctor with information about the size and shape of your heart and how well your heart's chambers and valves are working. This procedure takes approximately one hour. There are no restrictions for this procedure.   Your physician has requested that you have a lexiscan myoview. For further information please visit [email or web address]. Please follow instruction sheet, as given.  Follow-Up: Your physician wants you to follow-up in: 6 months with Dr. Radford Pax. You will receive a reminder letter in the mail two months in advance. If you don't receive a letter, please call our office to schedule the follow-up appointment.   Any Other Special Instructions Will Be Listed Below (If Applicable).

## 2014-12-20 ENCOUNTER — Telehealth: Payer: Self-pay | Admitting: Hematology

## 2014-12-20 NOTE — Telephone Encounter (Signed)
CONFIRMED NP APPT FOR 06/21 @ 10:30 W/DR. FENG

## 2014-12-21 ENCOUNTER — Encounter: Payer: Self-pay | Admitting: Hematology

## 2014-12-21 ENCOUNTER — Ambulatory Visit (HOSPITAL_BASED_OUTPATIENT_CLINIC_OR_DEPARTMENT_OTHER): Payer: PRIVATE HEALTH INSURANCE

## 2014-12-21 ENCOUNTER — Ambulatory Visit (HOSPITAL_BASED_OUTPATIENT_CLINIC_OR_DEPARTMENT_OTHER): Payer: PRIVATE HEALTH INSURANCE | Admitting: Hematology

## 2014-12-21 ENCOUNTER — Ambulatory Visit: Payer: PRIVATE HEALTH INSURANCE

## 2014-12-21 ENCOUNTER — Telehealth: Payer: Self-pay | Admitting: Hematology

## 2014-12-21 ENCOUNTER — Telehealth (HOSPITAL_COMMUNITY): Payer: Self-pay | Admitting: *Deleted

## 2014-12-21 VITALS — BP 134/60 | HR 84 | Temp 98.6°F | Resp 18 | Ht 59.5 in | Wt 198.4 lb

## 2014-12-21 DIAGNOSIS — I2510 Atherosclerotic heart disease of native coronary artery without angina pectoris: Secondary | ICD-10-CM | POA: Diagnosis not present

## 2014-12-21 DIAGNOSIS — D649 Anemia, unspecified: Secondary | ICD-10-CM

## 2014-12-21 DIAGNOSIS — E119 Type 2 diabetes mellitus without complications: Secondary | ICD-10-CM

## 2014-12-21 LAB — COMPREHENSIVE METABOLIC PANEL (CC13)
ALT: 18 U/L (ref 0–55)
ANION GAP: 8 meq/L (ref 3–11)
AST: 15 U/L (ref 5–34)
Albumin: 3.8 g/dL (ref 3.5–5.0)
Alkaline Phosphatase: 111 U/L (ref 40–150)
BUN: 29.8 mg/dL — ABNORMAL HIGH (ref 7.0–26.0)
CALCIUM: 9.9 mg/dL (ref 8.4–10.4)
CO2: 26 meq/L (ref 22–29)
CREATININE: 1 mg/dL (ref 0.6–1.1)
Chloride: 105 mEq/L (ref 98–109)
EGFR: 60 mL/min/{1.73_m2} — ABNORMAL LOW (ref >90)
GLUCOSE: 116 mg/dL (ref 70–140)
Potassium: 4.3 mEq/L (ref 3.5–5.1)
SODIUM: 140 meq/L (ref 136–145)
Total Bilirubin: 0.33 mg/dL (ref 0.20–1.20)
Total Protein: 6.4 g/dL (ref 6.4–8.3)

## 2014-12-21 LAB — CBC & DIFF AND RETIC
BASO%: 0.1 % (ref 0.0–2.0)
Basophils Absolute: 0 10*3/uL (ref 0.0–0.1)
EOS ABS: 0.1 10*3/uL (ref 0.0–0.5)
EOS%: 1.3 % (ref 0.0–7.0)
HCT: 31.6 % — ABNORMAL LOW (ref 34.8–46.6)
HEMOGLOBIN: 10.3 g/dL — AB (ref 11.6–15.9)
Immature Retic Fract: 11.6 % — ABNORMAL HIGH (ref 1.60–10.00)
LYMPH#: 1.9 10*3/uL (ref 0.9–3.3)
LYMPH%: 20.2 % (ref 14.0–49.7)
MCH: 28.4 pg (ref 25.1–34.0)
MCHC: 32.6 g/dL (ref 31.5–36.0)
MCV: 87.1 fL (ref 79.5–101.0)
MONO#: 0.4 10*3/uL (ref 0.1–0.9)
MONO%: 4.3 % (ref 0.0–14.0)
NEUT%: 74.1 % (ref 38.4–76.8)
NEUTROS ABS: 6.8 10*3/uL — AB (ref 1.5–6.5)
Platelets: 223 10*3/uL (ref 145–400)
RBC: 3.63 10*6/uL — AB (ref 3.70–5.45)
RDW: 15.5 % — AB (ref 11.2–14.5)
RETIC CT ABS: 80.59 10*3/uL (ref 33.70–90.70)
Retic %: 2.22 % — ABNORMAL HIGH (ref 0.70–2.10)
WBC: 9.2 10*3/uL (ref 3.9–10.3)

## 2014-12-21 LAB — IRON AND TIBC CHCC
%SAT: 13 % — AB (ref 21–57)
Iron: 47 ug/dL (ref 41–142)
TIBC: 353 ug/dL (ref 236–444)
UIBC: 306 ug/dL (ref 120–384)

## 2014-12-21 LAB — MORPHOLOGY: PLT EST: ADEQUATE

## 2014-12-21 LAB — LACTATE DEHYDROGENASE (CC13): LDH: 116 U/L — ABNORMAL LOW (ref 125–245)

## 2014-12-21 LAB — FERRITIN CHCC: Ferritin: 11 ng/ml (ref 9–269)

## 2014-12-21 LAB — TSH CHCC: TSH: 0.42 m[IU]/L (ref 0.308–3.960)

## 2014-12-21 NOTE — Progress Notes (Signed)
[facility]  Telephone:(336) [phone] Fax:([phone]  Clinic New consult Note   Patient Care Team: Carol Ada, MD as PCP - General (Family Medicine) Sueanne Margarita, MD as Consulting Physician (Cardiology) 12/21/2014  CHIEF COMPLAINTS/PURPOSE OF CONSULTATION:  Anemia   Referring physician:SMITH,CANDACE THIELE, MD  HISTORY OF PRESENTING ILLNESS:  [name] 64 y.o. female is here because of anemia.  She was found to have abnormal CBC since she was a child. She used to eat liver when she was a child. She does not remember her blood counts. She was not taking oral iron supplement until 6 months ago, she is on MVI with iron (61m) once daily. Her last CBC done by her PCP from 11/10/2014 showed WBC 10.2, normal differential, hemoglobin 10.1, hematocrit 31.0, MCV 83.9. Platelet count normal at 2 03/03/2014  She denies recent chest pain on exertion, she does have some shortness of breath on mild exertion, such as walking for a block, pre-syncopal episodes, or palpitations. She had not noticed any recent bleeding such as epistaxis, hematuria or hematochezia The patient takes aspirin 882mdaily, no other NSAID [facility], which all negative per patient.  She had no prior history or diagnosis of cancer. Her last screening mammogram was several years ago.  She denies any pica and eats a variety of diet. She never donated blood or received blood transfusion She had normal period, not heavy, and LMP was over 10 years ago.   She has some pain at left shoulder blade for a few years, no limited ROM of left shoulder. She otherwise feels well overball. 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   MEDICAL HISTORY:  Past Medical History  Diagnosis Date  . Grave's disease   . Obesity   . High cholesterol   . Type II diabetes mellitus   . Myocardial infarction ?2008  . Anemia   .  GERD (gastroesophageal reflux disease)   . HeSFKCLEXN(170.0    "maybe once/month" (07/15/2013)  . Migraines     "couple times/yr"  (07/15/2013)  . Arthritis     "left hip"  (07/15/2013)  . Depression   . Retinopathy 12/08    right  . RLS (restless legs syndrome)   . History of ASCVD (atherosclerotic cardiovascular disease)     single vessel-Dr TuRadford Pax. Osteopenia 12/2010 &01/21/2013    Minimal of hip DEXA   . Hypertension   . CAD (coronary artery disease)     a. s/p 2007  LAD w/ 3.5 x 16 mm Liberte stent b. s/p 07/2006 CABG with IMG to LAD and SVG to Diag. c. 12/2006 diagonal branch w/ 2.5 x 12 mm Endeavor stent. d. s/p 2010 cath with small vessel disease treated with RX  e. 07/2013 s/p cath showing stable dx from 2010 with no interventions     SURGICAL HISTORY: Past Surgical History  Procedure Laterality Date  . Laparoscopic cholecystectomy  1990's  . Tubal ligation  1976  . Coronary artery bypass graft  ?2008    s/p two vessel CABG off pump LAD diagonal and PTCA/DE Stent mid LAD/ DE stent LAD diagonal through previous stent sstruts,occluded SVG-diagonal  . Coronary angioplasty with stent placement      "I've had quite a few stents"   . Cardiac catheterization  02/03/2008    for CP patent LIMA w mid-vessel spasm.  . Colonoscopy  10/2010  rectal polyp  . Left heart catheterization with coronary angiogram N/A 07/15/2013    Procedure: LEFT HEART CATHETERIZATION WITH CORONARY ANGIOGRAM;  Surgeon: Peter M Martinique, MD;  Location: [facility] CATH LAB;  Service: Cardiovascular;  Laterality: N/A;    SOCIAL HISTORY: History   Social History  . Marital Status: Married    Spouse Name: N/A  . Number of Children: N/A  . Years of Education: N/A   Occupational History  . Not on file.   Social History Main Topics  . Smoking status: Former Smoker    Quit date: 07/02/1989  . Smokeless tobacco: Never Used  . Alcohol Use: No  . Drug Use: No  . Sexual Activity: Yes   Other Topics Concern  . Not on  file   Social History Narrative    FAMILY HISTORY: Family History  Problem Relation Age of Onset  . Cancer Father     Lung  . Hypertension Mother   . Cancer Mother     Lung  . Diabetes Mother   . Diabetes Maternal Grandmother   . Alzheimer's disease Maternal Grandmother     ALLERGIES:  has No Known Allergies.  MEDICATIONS:  Current Outpatient Prescriptions  Medication Sig Dispense Refill  . aspirin EC 81 MG tablet Take 1 tablet (81 mg total) by mouth daily.    Marland Kitchen atorvastatin (LIPITOR) 80 MG tablet Take 80 mg by mouth every evening.    . escitalopram (LEXAPRO) 10 MG tablet Take 10 mg by mouth daily.  1  . hydrochlorothiazide (HYDRODIURIL) 25 MG tablet Take 25 mg by mouth daily.    Marland Kitchen lisinopril (PRINIVIL,ZESTRIL) 5 MG tablet Take 5 mg by mouth daily.    . metFORMIN (GLUCOPHAGE) 1000 MG tablet Take 1 tablet (1,000 mg total) by mouth 2 (two) times daily with a meal. 30 tablet 6  . metoprolol tartrate (LOPRESSOR) 25 MG tablet Take 25 mg by mouth 2 (two) times daily.    . Multiple Vitamins-Iron (MULTIVITAMIN/IRON PO) Take 1 tablet by mouth daily.    . nitroGLYCERIN (NITROSTAT) 0.4 MG SL tablet Place 0.4 mg under the tongue every 5 (five) minutes as needed for chest pain.    Marland Kitchen omeprazole (PRILOSEC) 40 MG capsule Take 40 mg by mouth daily.    . ondansetron (ZOFRAN) 4 MG tablet Take 4 mg by mouth every 8 (eight) hours as needed for nausea or vomiting.    . isosorbide mononitrate (IMDUR) 30 MG 24 hr tablet TAKE 1 TABLET (30 MG TOTAL) BY MOUTH DAILY. 30 tablet 3   No current facility-administered medications for this visit.    REVIEW OF SYSTEMS:   Constitutional: Denies fevers, chills or abnormal night sweats Eyes: Denies blurriness of vision, double vision or watery eyes Ears, nose, mouth, throat, and face: Denies mucositis or sore throat Respiratory: Denies cough, dyspnea or wheezes Cardiovascular: Denies palpitation, chest discomfort or lower extremity swelling Gastrointestinal:   Denies nausea, heartburn or change in bowel habits Skin: Denies abnormal skin rashes Lymphatics: Denies new lymphadenopathy or easy bruising Neurological:Denies numbness, tingling or new weaknesses Behavioral/Psych: Mood is stable, no new changes  All other systems were reviewed with the patient and are negative.  PHYSICAL EXAMINATION: ECOG PERFORMANCE STATUS: 1  Filed Vitals:   12/21/14 1046  BP: 134/60  Pulse: 84  Temp: 98.6 F (37 C)  Resp: 18   Filed Weights   12/21/14 1046  Weight: 198 lb 6.4 oz (89.994 kg)    GENERAL:alert, no distress and comfortable SKIN: skin color, texture, turgor  are normal, no rashes or significant lesions EYES: normal, conjunctiva are pink and non-injected, sclera clear OROPHARYNX:no exudate, no erythema and lips, buccal mucosa, and tongue normal  NECK: supple, thyroid normal size, non-tender, without nodularity LYMPH:  no palpable lymphadenopathy in the cervical, axillary or inguinal LUNGS: clear to auscultation and percussion with normal breathing effort, scatter crackers on bilaterally lung basis HEART: regular rate & rhythm and no murmurs and no lower extremity edema ABDOMEN:abdomen soft, non-tender and normal bowel sounds Musculoskeletal:no cyanosis of digits and no clubbing, (+)  bilateral pitting edema up to any  PSYCH: alert & oriented x 3 with fluent speech NEURO: no focal motor/sensory deficits  LABORATORY DATA:  I have reviewed the data as listed CBC Latest Ref Rng 12/21/2014 12/03/2014 07/15/2013  WBC 3.9 - 10.3 10e3/uL 9.2 9.3 8.8  Hemoglobin 11.6 - 15.9 g/dL 10.3(L) 10.8(L) 10.6(L)  Hematocrit 34.8 - 46.6 % 31.6(L) 33.1(L) 32.4(L)  Platelets 145 - 400 10e3/uL 223 230.0 223    CMP Latest Ref Rng 12/03/2014 08/26/2014 03/16/2014  Glucose 70 - 99 mg/dL 132(H) - 103(H)  BUN 6 - 23 mg/dL 18 - 20  Creatinine 0.40 - 1.20 mg/dL 0.99 - 1.1  Sodium 135 - 145 mEq/L 138 - 137  Potassium 3.5 - 5.1 mEq/L 4.6 - 4.8  Chloride 96 - 112 mEq/L 102 -  104  CO2 19 - 32 mEq/L 29 - 24  Calcium 8.4 - 10.5 mg/dL 9.3 - 8.6  Total Protein 6.0 - 8.3 g/dL - - -  Total Bilirubin 0.3 - 1.2 mg/dL - - -  Alkaline Phos 39 - 117 U/L - - -  AST 0 - 37 U/L - - -  ALT 0 - 35 U/L - 9 -     RADIOGRAPHIC STUDIES: I have personally reviewed the radiological images as listed and agreed with the findings in the report. No results found.  ASSESSMENT & PLAN:  64 year old Caucasian female, with long-standing history of anemia since childhood (per patient), was referred for anemia evaluation.  1. Normocytic anemia -She has a mild anemia, with normal MCV. I'll check her reticular count today. -She had low serum iron level at 34, low transferrin saturation 9%, normal TIBC 372, on prior lab from January 2015. This is supportive for iron deficiency anemia. I'll repeat her ferritin and iron studies today. -We discussed the other possible etiology of her anemia, such as anemia of chronic disease, other nutritional anemia (folic acid or D74 deficiency ), bone marrow disorder, such as MDS, multiple myeloma etc.  -I'll check her J28, folic acid, SPEP with immunofixation, LDH, sedimentation rate, and I will review her peripheral blood smear morphology. -I suggest her to increase her oral iron intake, she will buy iron pill over the counter, and take 2-3 tablets a day. I suggest her to take vitamin C or orange juice with iron pill. -We discussed the alternative of IV iron if she does not respond to oral iron.  -She had negative GI workup 5 years ago per patient. She has had iron deficient anemia since childhood, could be an issue of absorption. -Givings her long duration of anemia since childhood, I'll also check hemoglobin electrophoresis -We also discussed the role of bone marrow biopsy to ruled out primary bone marrow disease giving her long-standing history of anemia, or stable, normal the BC and platelet count, I think the likelihood of bone marrow disease is very low.  I'll hold on the bone marrow biopsy for now.  2. Diabetes, CAD,  -  She'll continue follow-up with her primary care physician -She has signs of food overload on her physical exam today, she is scheduled to see a cardiologist and have her echocardiogram in 2 days. She'll follow-up with her cardiologist on that   Plan -lab today -Increase oral iron to 2-3 tablets a day -I'll see her back in 1 months with repeated CBC and ret   All questions were answered. The patient knows to call the clinic with any problems, questions or concerns. I spent 55 minutes counseling the patient face to face. The total time spent in the appointment was 60 minutes and more than 50% was on counseling.     Truitt Merle, MD 12/21/2014 12:34 PM

## 2014-12-21 NOTE — Telephone Encounter (Signed)
Patient given detailed instructions per Myocardial Perfusion Study Information Sheet for test on 12/23/14 at 0915. Patient Notified to arrive 15 minutes early, and that it is imperative to arrive on time for appointment to keep from having the test rescheduled. Patient verbalized understanding. Jomari Bartnik, Ranae [place]ms

## 2014-12-21 NOTE — Telephone Encounter (Signed)
Appointments made and avs printed for patient °

## 2014-12-21 NOTE — Progress Notes (Signed)
Checked in new pt with no financial concerns. °

## 2014-12-23 ENCOUNTER — Ambulatory Visit (HOSPITAL_COMMUNITY): Payer: PRIVATE HEALTH INSURANCE | Attending: Cardiovascular Disease

## 2014-12-23 ENCOUNTER — Other Ambulatory Visit: Payer: Self-pay

## 2014-12-23 ENCOUNTER — Ambulatory Visit (HOSPITAL_BASED_OUTPATIENT_CLINIC_OR_DEPARTMENT_OTHER): Payer: PRIVATE HEALTH INSURANCE

## 2014-12-23 ENCOUNTER — Telehealth: Payer: Self-pay

## 2014-12-23 DIAGNOSIS — R0602 Shortness of breath: Secondary | ICD-10-CM | POA: Diagnosis not present

## 2014-12-23 DIAGNOSIS — I10 Essential (primary) hypertension: Secondary | ICD-10-CM | POA: Insufficient documentation

## 2014-12-23 DIAGNOSIS — E785 Hyperlipidemia, unspecified: Secondary | ICD-10-CM | POA: Insufficient documentation

## 2014-12-23 DIAGNOSIS — E119 Type 2 diabetes mellitus without complications: Secondary | ICD-10-CM | POA: Diagnosis not present

## 2014-12-23 DIAGNOSIS — R079 Chest pain, unspecified: Secondary | ICD-10-CM | POA: Diagnosis not present

## 2014-12-23 DIAGNOSIS — Z951 Presence of aortocoronary bypass graft: Secondary | ICD-10-CM | POA: Diagnosis not present

## 2014-12-23 DIAGNOSIS — R9430 Abnormal result of cardiovascular function study, unspecified: Secondary | ICD-10-CM

## 2014-12-23 DIAGNOSIS — I071 Rheumatic tricuspid insufficiency: Secondary | ICD-10-CM | POA: Insufficient documentation

## 2014-12-23 LAB — SPEP & IFE WITH QIG
ALPHA-1-GLOBULIN: 0.3 g/dL (ref 0.2–0.3)
Albumin ELP: 3.8 g/dL (ref 3.8–4.8)
Alpha-2-Globulin: 0.8 g/dL (ref 0.5–0.9)
BETA GLOBULIN: 0.5 g/dL (ref 0.4–0.6)
Beta 2: 0.4 g/dL (ref 0.2–0.5)
Gamma Globulin: 0.6 g/dL — ABNORMAL LOW (ref 0.8–1.7)
IgA: 106 mg/dL (ref 69–380)
IgG (Immunoglobin G), Serum: 517 mg/dL — ABNORMAL LOW (ref 690–1700)
IgM, Serum: 60 mg/dL (ref 52–322)
Total Protein, Serum Electrophoresis: 6.4 g/dL (ref 6.1–8.1)

## 2014-12-23 LAB — MYOCARDIAL PERFUSION IMAGING
CHL CUP NUCLEAR SSS: 5
CHL CUP RESTING HR STRESS: 65 {beats}/min
LV dias vol: 81 mL
LVSYSVOL: 31 mL
NUC STRESS TID: 0.97
Peak HR: 103 {beats}/min
RATE: 0.29
SDS: 3
SRS: 2

## 2014-12-23 LAB — FOLATE RBC: RBC Folate: 1380 ng/mL (ref >280)

## 2014-12-23 LAB — VITAMIN B12: VITAMIN B 12: 256 pg/mL (ref 211–911)

## 2014-12-23 LAB — ERYTHROPOIETIN: Erythropoietin: 44.8 m[IU]/mL — ABNORMAL HIGH (ref 2.6–18.5)

## 2014-12-23 LAB — SEDIMENTATION RATE: Sed Rate: 12 mm/hr (ref 0–30)

## 2014-12-23 MED ORDER — TECHNETIUM TC 99M SESTAMIBI GENERIC - CARDIOLITE
33.0000 | Freq: Once | INTRAVENOUS | Status: AC | PRN
  Administered 2014-12-23: 33 via INTRAVENOUS

## 2014-12-23 MED ORDER — TECHNETIUM TC 99M SESTAMIBI GENERIC - CARDIOLITE
11.0000 | Freq: Once | INTRAVENOUS | Status: AC | PRN
  Administered 2014-12-23: 11 via INTRAVENOUS

## 2014-12-23 MED ORDER — REGADENOSON 0.4 MG/5ML IV SOLN
0.4000 mg | Freq: Once | INTRAVENOUS | Status: AC
  Administered 2014-12-23: 0.4 mg via INTRAVENOUS

## 2014-12-23 NOTE — Telephone Encounter (Signed)
-----   Message from Sueanne Margarita, MD sent at 12/23/2014  1:46 PM EDT ----- mildl LV dysfunction on echo but normal on nuclear and normal at time of last cath 2015. Please get cardiac MRI to assess EF

## 2014-12-23 NOTE — Telephone Encounter (Signed)
Informed patient of results and verbal understanding expressed.   Cardiac MRI ordered for scheduling.

## 2014-12-28 ENCOUNTER — Encounter: Payer: Self-pay | Admitting: Cardiology

## 2015-01-11 ENCOUNTER — Other Ambulatory Visit (HOSPITAL_COMMUNITY): Payer: PRIVATE HEALTH INSURANCE

## 2015-01-14 ENCOUNTER — Other Ambulatory Visit (HOSPITAL_BASED_OUTPATIENT_CLINIC_OR_DEPARTMENT_OTHER): Payer: PRIVATE HEALTH INSURANCE

## 2015-01-14 ENCOUNTER — Ambulatory Visit (HOSPITAL_COMMUNITY)
Admission: RE | Admit: 2015-01-14 | Discharge: 2015-01-14 | Disposition: A | Discharge status: Home / Self Care | Payer: No Typology Code available for payment source | Source: Ambulatory Visit | Attending: Cardiology | Admitting: Cardiology

## 2015-01-14 DIAGNOSIS — R9430 Abnormal result of cardiovascular function study, unspecified: Secondary | ICD-10-CM

## 2015-01-14 DIAGNOSIS — R0789 Other chest pain: Secondary | ICD-10-CM | POA: Diagnosis not present

## 2015-01-14 DIAGNOSIS — R0989 Other specified symptoms and signs involving the circulatory and respiratory systems: Secondary | ICD-10-CM | POA: Insufficient documentation

## 2015-01-14 DIAGNOSIS — D649 Anemia, unspecified: Secondary | ICD-10-CM | POA: Diagnosis not present

## 2015-01-14 LAB — CBC & DIFF AND RETIC
BASO%: 0.1 % (ref 0.0–2.0)
Basophils Absolute: 0 10*3/uL (ref 0.0–0.1)
EOS%: 1.7 % (ref 0.0–7.0)
Eosinophils Absolute: 0.2 10*3/uL (ref 0.0–0.5)
HCT: 30.8 % — ABNORMAL LOW (ref 34.8–46.6)
HGB: 9.9 g/dL — ABNORMAL LOW (ref 11.6–15.9)
Immature Retic Fract: 17.9 % — ABNORMAL HIGH (ref 1.60–10.00)
LYMPH#: 1.7 10*3/uL (ref 0.9–3.3)
LYMPH%: 18.6 % (ref 14.0–49.7)
MCH: 28.6 pg (ref 25.1–34.0)
MCHC: 32.1 g/dL (ref 31.5–36.0)
MCV: 89 fL (ref 79.5–101.0)
MONO#: 0.4 10*3/uL (ref 0.1–0.9)
MONO%: 4.4 % (ref 0.0–14.0)
NEUT#: 7 10*3/uL — ABNORMAL HIGH (ref 1.5–6.5)
NEUT%: 75.2 % (ref 38.4–76.8)
PLATELETS: 214 10*3/uL (ref 145–400)
RBC: 3.46 10*6/uL — AB (ref 3.70–5.45)
RDW: 15.3 % — ABNORMAL HIGH (ref 11.2–14.5)
Retic %: 2.17 % — ABNORMAL HIGH (ref 0.70–2.10)
Retic Ct Abs: 75.08 10*3/uL (ref 33.70–90.70)
WBC: 9.3 10*3/uL (ref 3.9–10.3)

## 2015-01-14 MED ORDER — GADOBENATE DIMEGLUMINE 529 MG/ML IV SOLN
30.0000 mL | Freq: Once | INTRAVENOUS | Status: AC
  Administered 2015-01-14: 30 mL via INTRAVENOUS

## 2015-01-18 ENCOUNTER — Encounter: Payer: PRIVATE HEALTH INSURANCE | Admitting: Hematology

## 2015-01-18 ENCOUNTER — Telehealth: Payer: Self-pay

## 2015-01-18 LAB — HEMOGLOBINOPATHY EVALUATION
Hemoglobin Other: 0 %
Hgb A2 Quant: 2.3 % (ref 2.2–3.2)
Hgb A: 97.7 % (ref 96.8–97.8)
Hgb F Quant: 0 % (ref 0.0–2.0)
Hgb S Quant: 0 %

## 2015-01-18 MED ORDER — ISOSORBIDE MONONITRATE ER 60 MG PO TB24: 60.0000 mg | ORAL_TABLET | Freq: Every day | ORAL | Status: DC

## 2015-01-18 NOTE — Progress Notes (Signed)
No show  This encounter was created in error - please disregard.

## 2015-01-18 NOTE — Telephone Encounter (Signed)
-----   Message from Sueanne Margarita, MD sent at 01/17/2015  7:49 PM EDT ----- Increase Imdur to 60mg  daily and call in 1-2 weeks to let us know if CP has improved

## 2015-01-18 NOTE — Telephone Encounter (Signed)
Instructed patient to INCREASE IMDUR to 60 mg daily and instructed her to call in 1-2 weeks for an update. Patient agrees with treatment plan.

## 2015-01-20 ENCOUNTER — Telehealth: Payer: Self-pay | Admitting: Hematology

## 2015-01-20 NOTE — Telephone Encounter (Signed)
per pof to sch pt appt-cld pt left message-mailed avs and gave pt r/s appt per pof

## 2015-02-14 ENCOUNTER — Other Ambulatory Visit: Payer: Self-pay | Admitting: Cardiology

## 2015-02-15 ENCOUNTER — Ambulatory Visit (HOSPITAL_BASED_OUTPATIENT_CLINIC_OR_DEPARTMENT_OTHER): Payer: PRIVATE HEALTH INSURANCE | Admitting: Hematology

## 2015-02-15 ENCOUNTER — Telehealth: Payer: Self-pay | Admitting: Hematology

## 2015-02-15 ENCOUNTER — Encounter: Payer: Self-pay | Admitting: Hematology

## 2015-02-15 ENCOUNTER — Ambulatory Visit (HOSPITAL_BASED_OUTPATIENT_CLINIC_OR_DEPARTMENT_OTHER): Payer: PRIVATE HEALTH INSURANCE

## 2015-02-15 VITALS — BP 112/46 | HR 77 | Temp 98.3°F | Resp 18 | Ht 59.5 in | Wt 200.0 lb

## 2015-02-15 DIAGNOSIS — D508 Other iron deficiency anemias: Secondary | ICD-10-CM

## 2015-02-15 DIAGNOSIS — E611 Iron deficiency: Secondary | ICD-10-CM | POA: Diagnosis not present

## 2015-02-15 DIAGNOSIS — I2510 Atherosclerotic heart disease of native coronary artery without angina pectoris: Secondary | ICD-10-CM | POA: Diagnosis not present

## 2015-02-15 DIAGNOSIS — D649 Anemia, unspecified: Secondary | ICD-10-CM | POA: Diagnosis not present

## 2015-02-15 DIAGNOSIS — E119 Type 2 diabetes mellitus without complications: Secondary | ICD-10-CM

## 2015-02-15 LAB — CBC & DIFF AND RETIC
BASO%: 0.1 % (ref 0.0–2.0)
BASOS ABS: 0 10*3/uL (ref 0.0–0.1)
EOS ABS: 0.1 10*3/uL (ref 0.0–0.5)
EOS%: 1.4 % (ref 0.0–7.0)
HCT: 29.3 % — ABNORMAL LOW (ref 34.8–46.6)
HEMOGLOBIN: 9.5 g/dL — AB (ref 11.6–15.9)
IMMATURE RETIC FRACT: 10.8 % — AB (ref 1.60–10.00)
LYMPH#: 1.5 10*3/uL (ref 0.9–3.3)
LYMPH%: 18.4 % (ref 14.0–49.7)
MCH: 28.7 pg (ref 25.1–34.0)
MCHC: 32.4 g/dL (ref 31.5–36.0)
MCV: 88.5 fL (ref 79.5–101.0)
MONO#: 0.3 10*3/uL (ref 0.1–0.9)
MONO%: 3.6 % (ref 0.0–14.0)
NEUT#: 6.4 10*3/uL (ref 1.5–6.5)
NEUT%: 76.5 % (ref 38.4–76.8)
PLATELETS: 211 10*3/uL (ref 145–400)
RBC: 3.31 10*6/uL — AB (ref 3.70–5.45)
RDW: 15.2 % — ABNORMAL HIGH (ref 11.2–14.5)
RETIC CT ABS: 65.54 10*3/uL (ref 33.70–90.70)
Retic %: 1.98 % (ref 0.70–2.10)
WBC: 8.4 10*3/uL (ref 3.9–10.3)

## 2015-02-15 LAB — FERRITIN CHCC: Ferritin: 12 ng/ml (ref 9–269)

## 2015-02-15 NOTE — Progress Notes (Addendum)
[facility]  Telephone:(336) [phone] Fax:(336) [phone]  Clinic New consult Note   Patient Care Team: Carol Ada, MD as PCP - General (Family Medicine) Sueanne Margarita, MD as Consulting Physician (Cardiology) 02/15/2015  CHIEF COMPLAINTS/PURPOSE OF CONSULTATION:  Anemia   Referring physician:SMITH,CANDACE Weyman Croon, MD  HISTORY OF PRESENTING ILLNESS:  [name] 64 y.o. female is here because of anemia.  She was found to have abnormal CBC since she was a child. She used to eat liver when she was a child. She does not remember her blood counts. She was not taking oral iron supplement until 6 months ago, she is on MVI with iron ($RemoveBe'18mg'WcRgPOumm$ ) once daily. Her last CBC done by her PCP from 11/10/2014 showed WBC 10.2, normal differential, hemoglobin 10.1, hematocrit 31.0, MCV 83.9. Platelet count normal at 2 03/03/2014  She denies recent chest pain on exertion, she does have some shortness of breath on mild exertion, such as walking for a block, pre-syncopal episodes, or palpitations. She had not noticed any recent bleeding such as epistaxis, hematuria or hematochezia The patient takes aspirin $RemoveBefor'81mg'YxdkTrGnKEXE$  daily, no other NSAID Her last colonoscopy was sabout 5 years ago at [facility], she also had EGD, which all negative per patient.  She had no prior history or diagnosis of cancer. Her last screening mammogram was several years ago.  She denies any pica and eats a variety of diet. She never donated blood or received blood transfusion She had normal period, not heavy, and LMP was over 10 years ago.   She has some pain at left shoulder blade for a few years, no limited ROM of left shoulder. She otherwise feels well overball. 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   INTERIM HISTORY [name] returns for follow-up. She is accompanied by her daughter. She has been taking iron pill once a day, no constipation or other new complaints. She has a mild fatigue,  able to function well. She denies any signs of bleeding.  MEDICAL HISTORY:  Past Medical History  Diagnosis Date  . Grave's disease   . Obesity   . High cholesterol   . Type II diabetes mellitus   . Myocardial infarction ?2008  . Anemia   . GERD (gastroesophageal reflux disease)   . SKAJGOTL(572.6)     "maybe once/month" (07/15/2013)  . Migraines     "couple times/yr"  (07/15/2013)  . Arthritis     "left hip"  (07/15/2013)  . Depression   . Retinopathy 12/08    right  . RLS (restless legs syndrome)   . History of ASCVD (atherosclerotic cardiovascular disease)     single vessel-Dr Radford Pax  . Osteopenia 12/2010 &01/21/2013    Minimal of hip DEXA   . Hypertension   . CAD (coronary artery disease)     a. s/p 2007  LAD w/ 3.5 x 16 mm Liberte stent b. s/p 07/2006 CABG with IMG to LAD and SVG to Diag. c. 12/2006 diagonal branch w/ 2.5 x 12 mm Endeavor stent. d. s/p 2010 cath with small vessel disease treated with RX  e. 07/2013 s/p cath showing stable dx from 2010 with no interventions     SURGICAL HISTORY: Past Surgical History  Procedure Laterality Date  . Laparoscopic cholecystectomy  1990's  . Tubal ligation  1976  . Coronary artery bypass graft  ?2008    s/p two vessel CABG off pump LAD diagonal and PTCA/DE Stent mid LAD/ DE stent LAD diagonal through previous  stent sstruts,occluded SVG-diagonal  . Coronary angioplasty with stent placement      "I've had quite a few stents"   . Cardiac catheterization  02/03/2008    for CP patent LIMA w mid-vessel spasm.  . Colonoscopy  10/2010    rectal polyp  . Left heart catheterization with coronary angiogram N/A 07/15/2013    Procedure: LEFT HEART CATHETERIZATION WITH CORONARY ANGIOGRAM;  Surgeon: Peter M Martinique, MD;  Location: [facility] CATH LAB;  Service: Cardiovascular;  Laterality: N/A;    SOCIAL HISTORY: Social History   Social History  . Marital Status: Married    Spouse Name: N/A  . Number of Children: N/A  . Years of Education: N/A    Occupational History  . Not on file.   Social History Main Topics  . Smoking status: Former Smoker    Quit date: 07/02/1989  . Smokeless tobacco: Never Used  . Alcohol Use: No  . Drug Use: No  . Sexual Activity: Yes   Other Topics Concern  . Not on file   Social History Narrative    FAMILY HISTORY: Family History  Problem Relation Age of Onset  . Cancer Father     Lung  . Hypertension Mother   . Cancer Mother     Lung  . Diabetes Mother   . Diabetes Maternal Grandmother   . Alzheimer's disease Maternal Grandmother     ALLERGIES:  has No Known Allergies.  MEDICATIONS:  Current Outpatient Prescriptions  Medication Sig Dispense Refill  . aspirin EC 81 MG tablet Take 1 tablet (81 mg total) by mouth daily.    Marland Kitchen atorvastatin (LIPITOR) 80 MG tablet Take 80 mg by mouth every evening.    . escitalopram (LEXAPRO) 10 MG tablet Take 10 mg by mouth daily.  1  . hydrochlorothiazide (HYDRODIURIL) 25 MG tablet Take 25 mg by mouth daily.    . isosorbide mononitrate (IMDUR) 60 MG 24 hr tablet Take 1 tablet (60 mg total) by mouth daily. 30 tablet 6  . lisinopril (PRINIVIL,ZESTRIL) 5 MG tablet Take 5 mg by mouth daily.    . metFORMIN (GLUCOPHAGE) 1000 MG tablet Take 1 tablet (1,000 mg total) by mouth 2 (two) times daily with a meal. 30 tablet 6  . metoprolol tartrate (LOPRESSOR) 25 MG tablet Take 25 mg by mouth 2 (two) times daily.    . Multiple Vitamins-Iron (MULTIVITAMIN/IRON PO) Take 1 tablet by mouth daily.    Marland Kitchen omeprazole (PRILOSEC) 40 MG capsule Take 40 mg by mouth daily.    . nitroGLYCERIN (NITROSTAT) 0.4 MG SL tablet Place 0.4 mg under the tongue every 5 (five) minutes as needed for chest pain.     No current facility-administered medications for this visit.    REVIEW OF SYSTEMS:   Constitutional: Denies fevers, chills or abnormal night sweats Eyes: Denies blurriness of vision, double vision or watery eyes Ears, nose, mouth, throat, and face: Denies mucositis or sore  throat Respiratory: Denies cough, dyspnea or wheezes Cardiovascular: Denies palpitation, chest discomfort or lower extremity swelling Gastrointestinal:  Denies nausea, heartburn or change in bowel habits Skin: Denies abnormal skin rashes Lymphatics: Denies new lymphadenopathy or easy bruising Neurological:Denies numbness, tingling or new weaknesses Behavioral/Psych: Mood is stable, no new changes  All other systems were reviewed with the patient and are negative.  PHYSICAL EXAMINATION: ECOG PERFORMANCE STATUS: 1  Filed Vitals:   02/15/15 1153  BP: 112/46  Pulse: 77  Temp: 98.3 F (36.8 C)  Resp: 18   Filed Weights  02/15/15 1153  Weight: 200 lb (90.719 kg)    GENERAL:alert, no distress and comfortable SKIN: skin color, texture, turgor are normal, no rashes or significant lesions EYES: normal, conjunctiva are pink and non-injected, sclera clear OROPHARYNX:no exudate, no erythema and lips, buccal mucosa, and tongue normal  NECK: supple, thyroid normal size, non-tender, without nodularity LYMPH:  no palpable lymphadenopathy in the cervical, axillary or inguinal LUNGS: clear to auscultation and percussion with normal breathing effort, scatter crackers on bilaterally lung basis HEART: regular rate & rhythm and no murmurs and no lower extremity edema ABDOMEN:abdomen soft, non-tender and normal bowel sounds Musculoskeletal:no cyanosis of digits and no clubbing, (+)  bilateral pitting edema up to any  PSYCH: alert & oriented x 3 with fluent speech NEURO: no focal motor/sensory deficits  LABORATORY DATA:  I have reviewed the data as listed CBC Latest Ref Rng 02/15/2015 01/14/2015 12/21/2014  WBC 3.9 - 10.3 10e3/uL 8.4 9.3 9.2  Hemoglobin 11.6 - 15.9 g/dL 9.5(L) 9.9(L) 10.3(L)  Hematocrit 34.8 - 46.6 % 29.3(L) 30.8(L) 31.6(L)  Platelets 145 - 400 10e3/uL 211 214 223    CMP Latest Ref Rng 12/21/2014 12/03/2014 08/26/2014  Glucose 70 - 140 mg/dl 116 132(H) -  BUN 7.0 - 26.0 mg/dL  29.8(H) 18 -  Creatinine 0.6 - 1.1 mg/dL 1.0 0.99 -  Sodium 136 - 145 mEq/L 140 138 -  Potassium 3.5 - 5.1 mEq/L 4.3 4.6 -  Chloride 96 - 112 mEq/L - 102 -  CO2 22 - 29 mEq/L 26 29 -  Calcium 8.4 - 10.4 mg/dL 9.9 9.3 -  Total Protein 6.4 - 8.3 g/dL 6.4 - -  Total Bilirubin 0.20 - 1.20 mg/dL 0.33 - -  Alkaline Phos 40 - 150 U/L 111 - -  AST 5 - 34 U/L 15 - -  ALT 0 - 55 U/L 18 - 9     RADIOGRAPHIC STUDIES: I have personally reviewed the radiological images as listed and agreed with the findings in the report. No results found.  ASSESSMENT & PLAN:  64 year old Caucasian female, with long-standing history of anemia since childhood (per patient), was referred for anemia evaluation.  1. Normocytic anemia, iron deficient and possible anemia of chronic disease  -She has a mild anemia, with normal MCV. I'll check her reticular count is low.  -She had low serum iron level at 34, low transferrin saturation 9%, normal TIBC 372, ferritin 11. This is supportive for iron deficiency anemia, but her anemia is slightly outer portion of her iron deficiency. She may have anemia of chronic disease also. -Her folic acid and X44 level were normal, SPEP were negative for M protein, TSH was normal, erythropoietin was elevated at 44.8, sedimentation rate was normal, and hemoglobin electrophoresis was normal. -She has not responded well to oral iron, I recommend her to have IV Feraheme weekly twice, benefits and the potential side effects, especially infusion ration, were discussed with her and she agrees to proceed -We also discussed other possibility of her anemia, especially bone marrow disease, such as MDS, If she does not respond to IV iron, I would consider to obtain a bone marrow biopsy.  2. Diabetes, CAD,  -She'll continue follow-up with her primary care physician   Plan -IV Feraheme 510 mg weekly twice, starting in 3 weeks -I'll see her back in 2 months, with lab a few days before her  appointment.  All questions were answered. The patient knows to call the clinic with any problems, questions or concerns. I spent 25  minutes counseling the patient face to face. The total time spent in the appointment was 30 minutes and more than 50% was on counseling.     Truitt Merle, MD 02/15/2015 12:56 PM

## 2015-02-15 NOTE — Telephone Encounter (Signed)
Gave avs & calendar for September °

## 2015-02-20 NOTE — Addendum Note (Signed)
Addended by: Truitt Merle on: 02/20/2015 10:55 PM   Modules accepted: Orders

## 2015-02-21 ENCOUNTER — Telehealth: Payer: Self-pay | Admitting: Hematology

## 2015-02-21 NOTE — Telephone Encounter (Signed)
per Dr Burr Medico to move appt to mid oct-sent Dr Burr Medico an staff message to see if feraheme should be moved as well-will call pt after reply

## 2015-02-23 ENCOUNTER — Telehealth: Payer: Self-pay | Admitting: Hematology

## 2015-02-23 NOTE — Telephone Encounter (Signed)
per pof to r/s pt appt per Dr feng-cld pt and answering machine kept cutting off-mailed pt avs with updated sch

## 2015-03-01 ENCOUNTER — Other Ambulatory Visit: Payer: PRIVATE HEALTH INSURANCE

## 2015-03-08 ENCOUNTER — Ambulatory Visit (HOSPITAL_BASED_OUTPATIENT_CLINIC_OR_DEPARTMENT_OTHER): Payer: PRIVATE HEALTH INSURANCE

## 2015-03-08 ENCOUNTER — Ambulatory Visit: Payer: PRIVATE HEALTH INSURANCE | Admitting: Hematology

## 2015-03-08 VITALS — BP 125/49 | HR 73 | Temp 97.7°F | Resp 18

## 2015-03-08 DIAGNOSIS — D649 Anemia, unspecified: Secondary | ICD-10-CM | POA: Diagnosis not present

## 2015-03-08 DIAGNOSIS — E611 Iron deficiency: Secondary | ICD-10-CM | POA: Diagnosis not present

## 2015-03-08 DIAGNOSIS — D508 Other iron deficiency anemias: Secondary | ICD-10-CM

## 2015-03-08 MED ORDER — SODIUM CHLORIDE 0.9 % IV SOLN
Freq: Once | INTRAVENOUS | Status: AC
  Administered 2015-03-08: 14:00:00 via INTRAVENOUS

## 2015-03-08 MED ORDER — SODIUM CHLORIDE 0.9 % IV SOLN
510.0000 mg | Freq: Once | INTRAVENOUS | Status: AC
  Administered 2015-03-08: 510 mg via INTRAVENOUS
  Filled 2015-03-08: qty 17

## 2015-03-08 NOTE — Patient Instructions (Signed)

## 2015-03-15 ENCOUNTER — Ambulatory Visit (HOSPITAL_BASED_OUTPATIENT_CLINIC_OR_DEPARTMENT_OTHER): Payer: PRIVATE HEALTH INSURANCE

## 2015-03-15 VITALS — BP 106/49 | HR 65 | Temp 97.1°F | Resp 20

## 2015-03-15 DIAGNOSIS — E611 Iron deficiency: Secondary | ICD-10-CM

## 2015-03-15 DIAGNOSIS — D508 Other iron deficiency anemias: Secondary | ICD-10-CM

## 2015-03-15 DIAGNOSIS — D649 Anemia, unspecified: Secondary | ICD-10-CM

## 2015-03-15 MED ORDER — SODIUM CHLORIDE 0.9 % IV SOLN
Freq: Once | INTRAVENOUS | Status: AC
  Administered 2015-03-15: 10:00:00 via INTRAVENOUS

## 2015-03-15 MED ORDER — SODIUM CHLORIDE 0.9 % IV SOLN
510.0000 mg | Freq: Once | INTRAVENOUS | Status: AC
  Administered 2015-03-15: 510 mg via INTRAVENOUS
  Filled 2015-03-15: qty 17

## 2015-03-15 NOTE — Patient Instructions (Signed)

## 2015-04-12 ENCOUNTER — Other Ambulatory Visit (HOSPITAL_BASED_OUTPATIENT_CLINIC_OR_DEPARTMENT_OTHER): Payer: No Typology Code available for payment source

## 2015-04-12 ENCOUNTER — Ambulatory Visit: Payer: PRIVATE HEALTH INSURANCE | Admitting: Hematology

## 2015-04-12 DIAGNOSIS — D649 Anemia, unspecified: Secondary | ICD-10-CM

## 2015-04-12 DIAGNOSIS — D508 Other iron deficiency anemias: Secondary | ICD-10-CM

## 2015-04-12 LAB — CBC & DIFF AND RETIC
BASO%: 0.1 % (ref 0.0–2.0)
BASOS ABS: 0 10*3/uL (ref 0.0–0.1)
EOS%: 2.1 % (ref 0.0–7.0)
Eosinophils Absolute: 0.2 10*3/uL (ref 0.0–0.5)
HEMATOCRIT: 34.2 % — AB (ref 34.8–46.6)
HGB: 11.2 g/dL — ABNORMAL LOW (ref 11.6–15.9)
Immature Retic Fract: 10.4 % — ABNORMAL HIGH (ref 1.60–10.00)
LYMPH%: 23.2 % (ref 14.0–49.7)
MCH: 30.4 pg (ref 25.1–34.0)
MCHC: 32.7 g/dL (ref 31.5–36.0)
MCV: 92.9 fL (ref 79.5–101.0)
MONO#: 0.3 10*3/uL (ref 0.1–0.9)
MONO%: 3.4 % (ref 0.0–14.0)
NEUT#: 6.1 10*3/uL (ref 1.5–6.5)
NEUT%: 71.2 % (ref 38.4–76.8)
Platelets: 191 10*3/uL (ref 145–400)
RBC: 3.68 10*6/uL — ABNORMAL LOW (ref 3.70–5.45)
RDW: 15.2 % — AB (ref 11.2–14.5)
RETIC %: 3.35 % — AB (ref 0.70–2.10)
Retic Ct Abs: 123.28 10*3/uL — ABNORMAL HIGH (ref 33.70–90.70)
WBC: 8.6 10*3/uL (ref 3.9–10.3)
lymph#: 2 10*3/uL (ref 0.9–3.3)

## 2015-04-12 LAB — FERRITIN CHCC: Ferritin: 234 ng/ml (ref 9–269)

## 2015-04-19 ENCOUNTER — Encounter: Payer: Self-pay | Admitting: Hematology

## 2015-04-19 ENCOUNTER — Telehealth: Payer: Self-pay | Admitting: Hematology

## 2015-04-19 ENCOUNTER — Ambulatory Visit (HOSPITAL_BASED_OUTPATIENT_CLINIC_OR_DEPARTMENT_OTHER): Payer: No Typology Code available for payment source | Admitting: Hematology

## 2015-04-19 VITALS — BP 140/60 | HR 90 | Temp 98.8°F | Resp 18 | Ht 59.5 in | Wt 206.1 lb

## 2015-04-19 DIAGNOSIS — E118 Type 2 diabetes mellitus with unspecified complications: Secondary | ICD-10-CM | POA: Diagnosis not present

## 2015-04-19 DIAGNOSIS — I2510 Atherosclerotic heart disease of native coronary artery without angina pectoris: Secondary | ICD-10-CM

## 2015-04-19 DIAGNOSIS — E611 Iron deficiency: Secondary | ICD-10-CM

## 2015-04-19 DIAGNOSIS — D649 Anemia, unspecified: Secondary | ICD-10-CM

## 2015-04-19 DIAGNOSIS — D508 Other iron deficiency anemias: Secondary | ICD-10-CM

## 2015-04-19 NOTE — Progress Notes (Signed)
[name]  [phone] [phone]  Clinic follow Up Note   Patient Care Team: Carol Ada, MD as PCP - General (Family Medicine) Sueanne Margarita, MD as Consulting Physician (Cardiology) 04/19/2015  CHIEF COMPLAINTS:  Follow up anemia   Referring physician:SMITH,CANDACE Weyman Croon, MD  HISTORY OF PRESENTING ILLNESS:  [name] 64 y.o. female is here because of anemia.  She was found to have abnormal CBC since she was a child. She used to eat liver when she was a child. She does not remember her blood counts. She was not taking oral iron supplement until 6 months ago, she is on MVI with iron (18mg ) once daily. Her last CBC done by her PCP from 11/10/2014 showed WBC 10.2, normal differential, hemoglobin 10.1, hematocrit 31.0, MCV 83.9. Platelet count normal at 2 03/03/2014  She denies recent chest pain on exertion, she does have some shortness of breath on mild exertion, such as walking for a block, pre-syncopal episodes, or palpitations. She had not noticed any recent bleeding such as epistaxis, hematuria or hematochezia The patient takes aspirin 81mg  daily, no other NSAID Her last colonoscopy was sabout 5 years ago at [place], she also had EGD, which all negative per patient.  She had no prior history or diagnosis of cancer. Her last screening mammogram was several years ago.  She denies any pica and eats a variety of diet. She never donated blood or received blood transfusion She had normal period, not heavy, and LMP was over 10 years ago.   She has some pain at left shoulder blade for a few years, no limited ROM of left shoulder. She otherwise feels well overball. 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   CURRENT THERAPY: iv Feraheme 510mg  weekly X2 as needed, she received on 03/08/15, 03/15/2015  INTERIM HISTORY [name] returns for follow-up. She is here for herself. She tolerated IV Feraheme very well,  without any side effects. She did not notice much change after the infusion. She feels well overall, no significant fatigue, pain, or other symptoms. She is very active physically.  MEDICAL HISTORY:  Past Medical History  Diagnosis Date  . Grave's disease   . Obesity   . High cholesterol   . Type II diabetes mellitus ([place])   . Myocardial infarction [facility]) ?2008  . Anemia   . GERD (gastroesophageal reflux disease)   . GYBWLSLH(734.2)     "maybe once/month" (07/15/2013)  . Migraines     "couple times/yr"  (07/15/2013)  . Arthritis     "left hip"  (07/15/2013)  . Depression   . Retinopathy 12/08    right  . RLS (restless legs syndrome)   . History of ASCVD (atherosclerotic cardiovascular disease)     single vessel-Dr Radford Pax  . Osteopenia 12/2010 &01/21/2013    Minimal of hip DEXA   . Hypertension   . CAD (coronary artery disease)     a. s/p 2007  LAD w/ 3.5 x 16 mm Liberte stent b. s/p 07/2006 CABG with IMG to LAD and SVG to Diag. c. 12/2006 diagonal branch w/ 2.5 x 12 mm Endeavor stent. d. s/p 2010 cath with small vessel disease treated with RX  e. 07/2013 s/p cath showing stable dx from 2010 with no interventions     SURGICAL HISTORY: Past Surgical History  Procedure Laterality Date  . Laparoscopic cholecystectomy  1990's  . Tubal ligation  1976  . Coronary artery bypass graft  ?2008  s/p two vessel CABG off pump LAD diagonal and PTCA/DE Stent mid LAD/ DE stent LAD diagonal through previous stent sstruts,occluded SVG-diagonal  . Coronary angioplasty with stent placement      "I've had quite a few stents"   . Cardiac catheterization  02/03/2008    for CP patent LIMA w mid-vessel spasm.  . Colonoscopy  10/2010    rectal polyp  . Left heart catheterization with coronary angiogram N/A 07/15/2013    Procedure: LEFT HEART CATHETERIZATION WITH CORONARY ANGIOGRAM;  Surgeon: Peter M Martinique, MD;  Location: [facility] CATH LAB;  Service: Cardiovascular;  Laterality: N/A;    SOCIAL HISTORY: Social  History   Social History  . Marital Status: Married    Spouse Name: N/A  . Number of Children: N/A  . Years of Education: N/A   Occupational History  . Not on file.   Social History Main Topics  . Smoking status: Former Smoker    Quit date: 07/02/1989  . Smokeless tobacco: Never Used  . Alcohol Use: No  . Drug Use: No  . Sexual Activity: Yes   Other Topics Concern  . Not on file   Social History Narrative    FAMILY HISTORY: Family History  Problem Relation Age of Onset  . Cancer Father     Lung  . Hypertension Mother   . Cancer Mother     Lung  . Diabetes Mother   . Diabetes Maternal Grandmother   . Alzheimer's disease Maternal Grandmother     ALLERGIES:  has No Known Allergies.  MEDICATIONS:  Current Outpatient Prescriptions  Medication Sig Dispense Refill  . aspirin EC 81 MG tablet Take 1 tablet (81 mg total) by mouth daily.    Marland Kitchen atorvastatin (LIPITOR) 80 MG tablet Take 80 mg by mouth every evening.    . escitalopram (LEXAPRO) 10 MG tablet Take 10 mg by mouth daily.  1  . hydrochlorothiazide (HYDRODIURIL) 25 MG tablet Take 25 mg by mouth daily.    . isosorbide mononitrate (IMDUR) 60 MG 24 hr tablet Take 1 tablet (60 mg total) by mouth daily. 30 tablet 6  . lisinopril (PRINIVIL,ZESTRIL) 5 MG tablet Take 5 mg by mouth daily.    . metFORMIN (GLUCOPHAGE) 1000 MG tablet Take 1 tablet (1,000 mg total) by mouth 2 (two) times daily with a meal. 30 tablet 6  . metoprolol tartrate (LOPRESSOR) 25 MG tablet Take 25 mg by mouth 2 (two) times daily.    . Multiple Vitamins-Iron (MULTIVITAMIN/IRON PO) Take 1 tablet by mouth daily.    . nitroGLYCERIN (NITROSTAT) 0.4 MG SL tablet Place 0.4 mg under the tongue every 5 (five) minutes as needed for chest pain.    Marland Kitchen omeprazole (PRILOSEC) 40 MG capsule Take 40 mg by mouth daily.     No current facility-administered medications for this visit.    REVIEW OF SYSTEMS:   Constitutional: Denies fevers, chills or abnormal night  sweats Eyes: Denies blurriness of vision, double vision or watery eyes Ears, nose, mouth, throat, and face: Denies mucositis or sore throat Respiratory: Denies cough, dyspnea or wheezes Cardiovascular: Denies palpitation, chest discomfort or lower extremity swelling Gastrointestinal:  Denies nausea, heartburn or change in bowel habits Skin: Denies abnormal skin rashes Lymphatics: Denies new lymphadenopathy or easy bruising Neurological:Denies numbness, tingling or new weaknesses Behavioral/Psych: Mood is stable, no new changes  All other systems were reviewed with the patient and are negative.  PHYSICAL EXAMINATION: ECOG PERFORMANCE STATUS: 0  Filed Vitals:   04/19/15 1305  BP:  140/60  Pulse: 90  Temp: 98.8 F (37.1 C)  Resp: 18   Filed Weights   04/19/15 1305  Weight: 206 lb 1.6 oz (93.486 kg)    GENERAL:alert, no distress and comfortable SKIN: skin color, texture, turgor are normal, no rashes or significant lesions, except a few ecchymosis on her left arm and right leg.  EYES: normal, conjunctiva are pink and non-injected, sclera clear OROPHARYNX:no exudate, no erythema and lips, buccal mucosa, and tongue normal  NECK: supple, thyroid normal size, non-tender, without nodularity LYMPH:  no palpable lymphadenopathy in the cervical, axillary or inguinal LUNGS: clear to auscultation and percussion with normal breathing effort, scatter crackers on bilaterally lung basis HEART: regular rate & rhythm and no murmurs and no lower extremity edema ABDOMEN:abdomen soft, non-tender and normal bowel sounds Musculoskeletal:no cyanosis of digits and no clubbing, (+)  bilateral pitting edema up to any  PSYCH: alert & oriented x 3 with fluent speech NEURO: no focal motor/sensory deficits  LABORATORY DATA:  I have reviewed the data as listed CBC Latest Ref Rng 04/12/2015 02/15/2015 01/14/2015  WBC 3.9 - 10.3 10e3/uL 8.6 8.4 9.3  Hemoglobin 11.6 - 15.9 g/dL 11.2(L) 9.5(L) 9.9(L)  Hematocrit  34.8 - 46.6 % 34.2(L) 29.3(L) 30.8(L)  Platelets 145 - 400 10e3/uL 191 211 214    CMP Latest Ref Rng 12/21/2014 12/03/2014 08/26/2014  Glucose 70 - 140 mg/dl 116 132(H) -  BUN 7.0 - 26.0 mg/dL 29.8(H) 18 -  Creatinine 0.6 - 1.1 mg/dL 1.0 0.99 -  Sodium 136 - 145 mEq/L 140 138 -  Potassium 3.5 - 5.1 mEq/L 4.3 4.6 -  Chloride 96 - 112 mEq/L - 102 -  CO2 22 - 29 mEq/L 26 29 -  Calcium 8.4 - 10.4 mg/dL 9.9 9.3 -  Total Protein 6.4 - 8.3 g/dL 6.4 - -  Total Bilirubin 0.20 - 1.20 mg/dL 0.33 - -  Alkaline Phos 40 - 150 U/L 111 - -  AST 5 - 34 U/L 15 - -  ALT 0 - 55 U/L 18 - 9   Ferritin  Status: Finalresult Visible to patient:  Not Released Nextappt: Today at 01:15 PM in Oncology Burr Medico, Krista Blue, MD) Dx:  Other iron deficiency anemias           Ref Range 7d ago  2mo ago  69mo ago     Ferritin 9 - 269 ng/ml 234 12 11          RADIOGRAPHIC STUDIES: I have personally reviewed the radiological images as listed and agreed with the findings in the report. No results found.  ASSESSMENT & PLAN:  64 year old Caucasian female, with long-standing history of anemia since childhood (per patient), was referred for anemia evaluation.  1. Normocytic anemia, iron deficient and possible anemia of chronic disease  -She has a mild anemia, with normal MCV. I'll check her reticular count is low.  -She had low serum iron level at 34, low transferrin saturation 9%, normal TIBC 372, ferritin 11. This is supportive for iron deficiency anemia, but her anemia is slightly outer portion of her iron deficiency. She may have anemia of chronic disease also. -Her folic acid and P53 level were normal, SPEP were negative for M protein, TSH was normal, erythropoietin was elevated at 44.8, sedimentation rate was normal, and hemoglobin electrophoresis was normal. -She has not responded well to oral iron, I recommended IV Feraheme. She tolerated well. Her hemoglobin has improved to 11.2 today, with  significant reticulocytosis, indicating her responding to IV Feraheme. -  We also discussed other possibility of her anemia, especially bone marrow disease, such as MDS, her anemia has improved after IV Feraheme, her white count and platelet count have been normal, I think is a suspicion for MDS is low.I'll hold on bone  Marrow biopsy for now. -Continue monitoring her blood counts and iron level.  2. Diabetes, CAD,  -She'll continue follow-up with her primary care physician   Plan -CBC and iron studies in 3 months -I'll see her back in 6 months with lab week before  All questions were answered. The patient knows to call the clinic with any problems, questions or concerns. I spent 25 minutes counseling the patient face to face. The total time spent in the appointment was 30 minutes and more than 50% was on counseling.     Truitt Merle, MD 04/19/2015 1:29 PM

## 2015-04-19 NOTE — Telephone Encounter (Signed)
Gave adn printed appt sched and avs for pt for Jan and April 2017

## 2015-07-12 ENCOUNTER — Other Ambulatory Visit: Payer: No Typology Code available for payment source

## 2015-08-13 ENCOUNTER — Emergency Department (HOSPITAL_COMMUNITY): Payer: No Typology Code available for payment source

## 2015-08-13 ENCOUNTER — Emergency Department (HOSPITAL_COMMUNITY)
Admission: EM | Admit: 2015-08-13 | Discharge: 2015-08-13 | Disposition: A | Discharge status: Home / Self Care | Payer: No Typology Code available for payment source | Attending: Emergency Medicine | Admitting: Emergency Medicine

## 2015-08-13 ENCOUNTER — Encounter (HOSPITAL_COMMUNITY): Payer: Self-pay

## 2015-08-13 DIAGNOSIS — S0990XA Unspecified injury of head, initial encounter: Secondary | ICD-10-CM | POA: Insufficient documentation

## 2015-08-13 DIAGNOSIS — E7800 Pure hypercholesterolemia, unspecified: Secondary | ICD-10-CM | POA: Diagnosis not present

## 2015-08-13 DIAGNOSIS — I10 Essential (primary) hypertension: Secondary | ICD-10-CM | POA: Diagnosis not present

## 2015-08-13 DIAGNOSIS — Z87891 Personal history of nicotine dependence: Secondary | ICD-10-CM | POA: Diagnosis not present

## 2015-08-13 DIAGNOSIS — R079 Chest pain, unspecified: Secondary | ICD-10-CM

## 2015-08-13 DIAGNOSIS — S29001A Unspecified injury of muscle and tendon of front wall of thorax, initial encounter: Secondary | ICD-10-CM | POA: Insufficient documentation

## 2015-08-13 DIAGNOSIS — F329 Major depressive disorder, single episode, unspecified: Secondary | ICD-10-CM | POA: Insufficient documentation

## 2015-08-13 DIAGNOSIS — S199XXA Unspecified injury of neck, initial encounter: Secondary | ICD-10-CM | POA: Diagnosis not present

## 2015-08-13 DIAGNOSIS — K219 Gastro-esophageal reflux disease without esophagitis: Secondary | ICD-10-CM | POA: Insufficient documentation

## 2015-08-13 DIAGNOSIS — D649 Anemia, unspecified: Secondary | ICD-10-CM | POA: Diagnosis not present

## 2015-08-13 DIAGNOSIS — Z9889 Other specified postprocedural states: Secondary | ICD-10-CM | POA: Diagnosis not present

## 2015-08-13 DIAGNOSIS — E669 Obesity, unspecified: Secondary | ICD-10-CM | POA: Insufficient documentation

## 2015-08-13 DIAGNOSIS — I252 Old myocardial infarction: Secondary | ICD-10-CM | POA: Insufficient documentation

## 2015-08-13 DIAGNOSIS — G2581 Restless legs syndrome: Secondary | ICD-10-CM | POA: Insufficient documentation

## 2015-08-13 DIAGNOSIS — Z951 Presence of aortocoronary bypass graft: Secondary | ICD-10-CM | POA: Diagnosis not present

## 2015-08-13 DIAGNOSIS — E119 Type 2 diabetes mellitus without complications: Secondary | ICD-10-CM | POA: Diagnosis not present

## 2015-08-13 DIAGNOSIS — S59902A Unspecified injury of left elbow, initial encounter: Secondary | ICD-10-CM | POA: Diagnosis not present

## 2015-08-13 DIAGNOSIS — Z79899 Other long term (current) drug therapy: Secondary | ICD-10-CM | POA: Insufficient documentation

## 2015-08-13 DIAGNOSIS — Z9861 Coronary angioplasty status: Secondary | ICD-10-CM | POA: Insufficient documentation

## 2015-08-13 DIAGNOSIS — M1990 Unspecified osteoarthritis, unspecified site: Secondary | ICD-10-CM | POA: Diagnosis not present

## 2015-08-13 DIAGNOSIS — Z7984 Long term (current) use of oral hypoglycemic drugs: Secondary | ICD-10-CM | POA: Insufficient documentation

## 2015-08-13 DIAGNOSIS — Y998 Other external cause status: Secondary | ICD-10-CM | POA: Insufficient documentation

## 2015-08-13 DIAGNOSIS — Y9389 Activity, other specified: Secondary | ICD-10-CM | POA: Insufficient documentation

## 2015-08-13 DIAGNOSIS — Z7982 Long term (current) use of aspirin: Secondary | ICD-10-CM | POA: Insufficient documentation

## 2015-08-13 DIAGNOSIS — W19XXXA Unspecified fall, initial encounter: Secondary | ICD-10-CM

## 2015-08-13 DIAGNOSIS — Y92410 Unspecified street and highway as the place of occurrence of the external cause: Secondary | ICD-10-CM | POA: Insufficient documentation

## 2015-08-13 LAB — I-STAT TROPONIN, ED: Troponin i, poc: 0 ng/mL (ref 0.00–0.08)

## 2015-08-13 LAB — BASIC METABOLIC PANEL
ANION GAP: 12 (ref 5–15)
BUN: 15 mg/dL (ref 6–20)
CHLORIDE: 101 mmol/L (ref 101–111)
CO2: 27 mmol/L (ref 22–32)
CREATININE: 0.88 mg/dL (ref 0.44–1.00)
Calcium: 9 mg/dL (ref 8.9–10.3)
GFR calc non Af Amer: >60 mL/min (ref >60)
Glucose, Bld: 144 mg/dL — ABNORMAL HIGH (ref 65–99)
POTASSIUM: 4 mmol/L (ref 3.5–5.1)
SODIUM: 140 mmol/L (ref 135–145)

## 2015-08-13 LAB — CBC
HCT: 36.1 % (ref 36.0–46.0)
HEMOGLOBIN: 12.1 g/dL (ref 12.0–15.0)
MCH: 31 pg (ref 26.0–34.0)
MCHC: 33.5 g/dL (ref 30.0–36.0)
MCV: 92.6 fL (ref 78.0–100.0)
PLATELETS: 210 10*3/uL (ref 150–400)
RBC: 3.9 MIL/uL (ref 3.87–5.11)
RDW: 13 % (ref 11.5–15.5)
WBC: 8.1 10*3/uL (ref 4.0–10.5)

## 2015-08-13 MED ORDER — TRAMADOL HCL 50 MG PO TABS: 50.0000 mg | ORAL_TABLET | Freq: Four times a day (QID) | ORAL | Status: DC | PRN

## 2015-08-13 NOTE — ED Notes (Signed)
Patient moved to waiting room. Patient family states that patient had chest early today. Patient stat that she forgot to inform ED staff of chest pain. Patient returned to Triage for EKG, and to speak with Nurse.

## 2015-08-13 NOTE — ED Notes (Signed)
Removed [place] collar

## 2015-08-13 NOTE — ED Notes (Signed)
Pt states she had cp 2-3 hours before slipping and falling. It was intermittent.

## 2015-08-13 NOTE — ED Provider Notes (Signed)
CSN: [id number]     Arrival date & time 08/13/15  1858 History   First MD Initiated Contact with Patient 08/13/15 2152     Chief Complaint  Patient presents with  . Head Injury  . Arm Pain  . Fall     (Consider location/radiation/quality/duration/timing/severity/associated sxs/prior Treatment) Patient is a 65 y.o. female presenting with head injury, arm pain, and fall. The history is provided by the patient and a relative.  Head Injury Associated symptoms: headache and neck pain   Associated symptoms: no nausea and no vomiting   Arm Pain Associated symptoms include chest pain and headaches. Pertinent negatives include no abdominal pain and no shortness of breath.  Fall Associated symptoms include chest pain and headaches. Pertinent negatives include no abdominal pain and no shortness of breath.   patient status post fall on getting on a church bus falling backwards striking the back of her head. Patient in triage with complaint of head pain left elbow pain and left hip pain. [place] complaining of left elbow pain and back of head pain. Patient's also had intermittent chest pain today. Patient had a history of this in the past. She does have stents of the nature of the chest pain is not unusual for her. Pain is never lasted even 5 minutes. Patient is followed by cardiology. No shortness of breath no nausea and vomiting. Patient able to ambulate fine.  Past Medical History  Diagnosis Date  . Grave's disease   . Obesity   . High cholesterol   . Type II diabetes mellitus ([place])   . Myocardial infarction [facility]) ?2008  . Anemia   . GERD (gastroesophageal reflux disease)   . [id number])     "maybe once/month" (07/15/2013)  . Migraines     "couple times/yr"  (07/15/2013)  . Arthritis     "left hip"  (07/15/2013)  . Depression   . Retinopathy 12/08    right  . RLS (restless legs syndrome)   . History of ASCVD (atherosclerotic cardiovascular disease)     single vessel-Dr Radford Pax  .  Osteopenia 12/2010 &01/21/2013    Minimal of hip DEXA   . Hypertension   . CAD (coronary artery disease)     a. s/p 2007  LAD w/ 3.5 x 16 mm Liberte stent b. s/p 07/2006 CABG with IMG to LAD and SVG to Diag. c. 12/2006 diagonal branch w/ 2.5 x 12 mm Endeavor stent. d. s/p 2010 cath with small vessel disease treated with RX  e. 07/2013 s/p cath showing stable dx from 2010 with no interventions    Past Surgical History  Procedure Laterality Date  . Laparoscopic cholecystectomy  1990's  . Tubal ligation  1976  . Coronary artery bypass graft  ?2008    s/p two vessel CABG off pump LAD diagonal and PTCA/DE Stent mid LAD/ DE stent LAD diagonal through previous stent sstruts,occluded SVG-diagonal  . Coronary angioplasty with stent placement      "I've had quite a few stents"   . Cardiac catheterization  02/03/2008    for CP patent LIMA w mid-vessel spasm.  . Colonoscopy  10/2010    rectal polyp  . Left heart catheterization with coronary angiogram N/A 07/15/2013    Procedure: LEFT HEART CATHETERIZATION WITH CORONARY ANGIOGRAM;  Surgeon: Peter M Martinique, MD;  Location: [facility] CATH LAB;  Service: Cardiovascular;  Laterality: N/A;   Family History  Problem Relation Age of Onset  . Cancer Father     Lung  . Hypertension Mother   .  Cancer Mother     Lung  . Diabetes Mother   . Diabetes Maternal Grandmother   . Alzheimer's disease Maternal Grandmother    Social History  Substance Use Topics  . Smoking status: Former Smoker    Quit date: 07/02/1989  . Smokeless tobacco: Never Used  . Alcohol Use: No   OB History    No data available     Review of Systems  Constitutional: Negative for fever.  HENT: Negative for congestion.   Eyes: Negative for visual disturbance.  Respiratory: Negative for shortness of breath.   Cardiovascular: Positive for chest pain.  Gastrointestinal: Negative for nausea, vomiting and abdominal pain.  Genitourinary: Negative for dysuria.  Musculoskeletal: Positive for neck  pain. Negative for back pain.  Skin: Negative for rash.  Neurological: Positive for headaches.  Hematological: Does not bruise/bleed easily.  Psychiatric/Behavioral: Negative for confusion.      Allergies  Review of patient's allergies indicates no known allergies.  Home Medications   Prior to Admission medications   Medication Sig Start Date End Date Taking? Authorizing Provider  aspirin EC 81 MG tablet Take 1 tablet (81 mg total) by mouth daily. 09/16/13  Yes Sueanne Margarita, MD  atorvastatin (LIPITOR) 80 MG tablet Take 80 mg by mouth every evening.   Yes Historical Provider, MD  escitalopram (LEXAPRO) 10 MG tablet Take 10 mg by mouth daily. 09/09/14  Yes Historical Provider, MD  ferrous sulfate 325 (65 FE) MG EC tablet Take 325 mg by mouth daily with breakfast.   Yes Historical Provider, MD  hydrochlorothiazide (HYDRODIURIL) 25 MG tablet Take 25 mg by mouth daily.   Yes Historical Provider, MD  isosorbide mononitrate (IMDUR) 60 MG 24 hr tablet Take 1 tablet (60 mg total) by mouth daily. 01/18/15  Yes Sueanne Margarita, MD  metFORMIN (GLUCOPHAGE) 1000 MG tablet Take 1 tablet (1,000 mg total) by mouth 2 (two) times daily with a meal. 07/18/13  Yes Eileen Stanford, PA-C  metoprolol tartrate (LOPRESSOR) 25 MG tablet Take 25 mg by mouth 2 (two) times daily.   Yes Historical Provider, MD  nitroGLYCERIN (NITROSTAT) 0.4 MG SL tablet Place 0.4 mg under the tongue every 5 (five) minutes as needed for chest pain.   Yes Historical Provider, MD  omeprazole (PRILOSEC) 40 MG capsule Take 40 mg by mouth daily.   Yes Historical Provider, MD  traMADol (ULTRAM) 50 MG tablet Take 1 tablet (50 mg total) by mouth every 6 (six) hours as needed. 08/13/15   Fredia Sorrow, MD   BP 144/69 mmHg  Pulse 70  Temp(Src) 97.7 F (36.5 C) (Oral)  Resp 20  Ht 4\' 11"  (1.499 m)  Wt 86.183 kg  BMI 38.35 kg/m2  SpO2 96% Physical Exam  Constitutional: She is oriented to person, place, and time. She appears  well-developed and well-nourished. No distress.  HENT:  Head: Normocephalic.  Mouth/Throat: Oropharynx is clear and moist.  Back of the head with a tender area no sniffing hematoma no bleeding.  Eyes: Conjunctivae and EOM are normal. Pupils are equal, round, and reactive to light.  Neck: Normal range of motion. Neck supple.  Cardiovascular: Normal rate, regular rhythm and normal heart sounds.   No murmur heard. Pulmonary/Chest: Effort normal and breath sounds normal. No respiratory distress.  Abdominal: Soft. Bowel sounds are normal. There is no tenderness.  Musculoskeletal: Normal range of motion.  Neurological: She is alert and oriented to person, place, and time. No cranial nerve deficit. She exhibits normal muscle tone. Coordination  normal.  Skin: Skin is warm.  Nursing note and vitals reviewed.   ED Course  Procedures (including critical care time) Labs Review Labs Reviewed  BASIC METABOLIC PANEL - Abnormal; Notable for the following:    Glucose, Bld 144 (*)    All other components within normal limits  CBC  I-STAT TROPOININ, ED    Imaging Review Dg Chest 2 View  08/13/2015  CLINICAL DATA:  65 year old female with chest pain EXAM: CHEST  2 VIEW COMPARISON:  Radiograph dated 07/14/2013 FINDINGS: Two views of the chest do not demonstrate a focal consolidation. There is no pleural effusion or pneumothorax. Median sternotomy wires and CABG vascular clips noted. No acute osseous pathology. IMPRESSION: No active cardiopulmonary disease. Electronically Signed   By: Anner Crete M.D.   On: 08/13/2015 20:35   Ct Head Wo Contrast  08/13/2015  CLINICAL DATA:  65 year old who fell while getting off of a bus, striking the back of her head on the bus steps and on concrete. Generalized headache and left-sided neck pain. Initial encounter. EXAM: CT HEAD WITHOUT CONTRAST CT CERVICAL SPINE WITHOUT CONTRAST TECHNIQUE: Multidetector CT imaging of the head and cervical spine was performed  following the standard protocol without intravenous contrast. Multiplanar CT image reconstructions of the cervical spine were also generated. COMPARISON:  No prior brain imaging.  MRI cervical spine 05/16/2007. FINDINGS: CT HEAD FINDINGS Mild age related cortical atrophy. Ventricular system normal in size and appearance for age. No mass lesion. No midline shift. No acute hemorrhage or hematoma. No extra-axial fluid collections. No evidence of acute infarction. No skull fracture or other focal osseous abnormality involving the skull. Visualized paranasal sinuses, bilateral mastoid air cells and bilateral middle ear cavities well-aerated. Moderate bilateral carotid siphon atherosclerosis. CT CERVICAL SPINE FINDINGS No fractures identified involving the cervical spine. Sagittal reconstructed images demonstrate anatomic alignment. Minimal disc space narrowing at C6-7. Remaining disc spaces well preserved. Facet joints intact throughout with minimal degenerative changes. No spinal stenosis. Coronal reformatted images demonstrate an intact craniocervical junction, intact C1-C2 articulation, intact dens, and intact lateral masses throughout. No significant bony foraminal stenoses. Note is made of the multinodular thyroid goiter which has been identified on multiple prior imaging studies. IMPRESSION: 1. No acute intracranial abnormality. 2. No cervical spine fractures identified. Electronically Signed   By: Evangeline Dakin M.D.   On: 08/13/2015 21:29   Ct Cervical Spine Wo Contrast  08/13/2015  CLINICAL DATA:  65 year old who fell while getting off of a bus, striking the back of her head on the bus steps and on concrete. Generalized headache and left-sided neck pain. Initial encounter. EXAM: CT HEAD WITHOUT CONTRAST CT CERVICAL SPINE WITHOUT CONTRAST TECHNIQUE: Multidetector CT imaging of the head and cervical spine was performed following the standard protocol without intravenous contrast. Multiplanar CT image  reconstructions of the cervical spine were also generated. COMPARISON:  No prior brain imaging.  MRI cervical spine 05/16/2007. FINDINGS: CT HEAD FINDINGS Mild age related cortical atrophy. Ventricular system normal in size and appearance for age. No mass lesion. No midline shift. No acute hemorrhage or hematoma. No extra-axial fluid collections. No evidence of acute infarction. No skull fracture or other focal osseous abnormality involving the skull. Visualized paranasal sinuses, bilateral mastoid air cells and bilateral middle ear cavities well-aerated. Moderate bilateral carotid siphon atherosclerosis. CT CERVICAL SPINE FINDINGS No fractures identified involving the cervical spine. Sagittal reconstructed images demonstrate anatomic alignment. Minimal disc space narrowing at C6-7. Remaining disc spaces well preserved. Facet joints intact throughout with minimal  degenerative changes. No spinal stenosis. Coronal reformatted images demonstrate an intact craniocervical junction, intact C1-C2 articulation, intact dens, and intact lateral masses throughout. No significant bony foraminal stenoses. Note is made of the multinodular thyroid goiter which has been identified on multiple prior imaging studies. IMPRESSION: 1. No acute intracranial abnormality. 2. No cervical spine fractures identified. Electronically Signed   By: Evangeline Dakin M.D.   On: 08/13/2015 21:29   I have personally reviewed and evaluated these images and lab results as part of my medical decision-making.   EKG Interpretation   Date/Time:  Saturday August 13 2015 19:32:12 EST Ventricular Rate:  71 PR Interval:  152 QRS Duration: 78 QT Interval:  394 QTC Calculation: 428 R Axis:   42 Text Interpretation:  Normal sinus rhythm Nonspecific T wave abnormality  Abnormal ECG Confirmed by Deliana Avalos  MD, Jamiesha Victoria ([id number]) on 08/13/2015  9:56:37 PM      MDM   Final diagnoses:  Fall, initial encounter  Head injury, initial encounter  Chest  pain, unspecified chest pain type    Patient with 2 complaints. Patient with history of intermittent left substernal chest pain. Lasting less than 5 minutes. Patient with a history of stents. Patient has been having chest pain like this in the past. Not consider the normal for her. She is followed by cardiology. The other concern was that she slipped getting on a church bus fell backward hitting the back of her head on the cement. No loss of consciousness. Or perhaps a very brief period of loss of consciousness. An abrasion to her left elbow. Patient not complaining of any significant left hip pain now. CT of head neck was negative chest x-ray was negative workup for the chest pain was negative.    Fredia Sorrow, MD 08/13/15 2221

## 2015-08-13 NOTE — Discharge Instructions (Signed)
Make appointment to follow-up with your cardiologist over the intermittent chest pain. Return for any new or worse chest pain. Workup for the fall without any acute injuries. Take the tramadol as needed for pain.

## 2015-08-13 NOTE — ED Notes (Signed)
Pt here for head injury. States she was getting on a church bus, slipped and foot went down making her fall and hit the cement. Hit the back of her head on the cement and blacked out for a few seconds. Abrasion to left elbow. And pain to left hip.

## 2015-08-22 ENCOUNTER — Other Ambulatory Visit: Payer: Self-pay | Admitting: Cardiology

## 2015-10-04 ENCOUNTER — Other Ambulatory Visit (HOSPITAL_BASED_OUTPATIENT_CLINIC_OR_DEPARTMENT_OTHER): Payer: No Typology Code available for payment source

## 2015-10-04 DIAGNOSIS — D508 Other iron deficiency anemias: Secondary | ICD-10-CM

## 2015-10-04 DIAGNOSIS — D649 Anemia, unspecified: Secondary | ICD-10-CM

## 2015-10-04 LAB — CBC & DIFF AND RETIC
BASO%: 0.2 % (ref 0.0–2.0)
BASOS ABS: 0 10*3/uL (ref 0.0–0.1)
EOS ABS: 0.1 10*3/uL (ref 0.0–0.5)
EOS%: 1.3 % (ref 0.0–7.0)
HCT: 37.1 % (ref 34.8–46.6)
HGB: 12.9 g/dL (ref 11.6–15.9)
Immature Retic Fract: 12.2 % — ABNORMAL HIGH (ref 1.60–10.00)
LYMPH%: 23.4 % (ref 14.0–49.7)
MCH: 31.6 pg (ref 25.1–34.0)
MCHC: 34.8 g/dL (ref 31.5–36.0)
MCV: 90.9 fL (ref 79.5–101.0)
MONO#: 0.4 10*3/uL (ref 0.1–0.9)
MONO%: 4.3 % (ref 0.0–14.0)
NEUT#: 5.9 10*3/uL (ref 1.5–6.5)
NEUT%: 70.8 % (ref 38.4–76.8)
NRBC: 0 % (ref 0–0)
Platelets: 209 10*3/uL (ref 145–400)
RBC: 4.08 10*6/uL (ref 3.70–5.45)
RDW: 12.6 % (ref 11.2–14.5)
RETIC %: 2.43 % — AB (ref 0.70–2.10)
Retic Ct Abs: 99.14 10*3/uL — ABNORMAL HIGH (ref 33.70–90.70)
WBC: 8.3 10*3/uL (ref 3.9–10.3)
lymph#: 1.9 10*3/uL (ref 0.9–3.3)

## 2015-10-04 LAB — IRON AND TIBC
%SAT: 23 % (ref 21–57)
Iron: 65 ug/dL (ref 41–142)
TIBC: 280 ug/dL (ref 236–444)
UIBC: 215 ug/dL (ref 120–384)

## 2015-10-04 LAB — FERRITIN: FERRITIN: 152 ng/mL (ref 9–269)

## 2015-10-10 ENCOUNTER — Telehealth: Payer: Self-pay | Admitting: Hematology

## 2015-10-10 NOTE — Telephone Encounter (Signed)
returned call and s.w pt and confirmed appts....pt ok and aware °

## 2015-10-11 ENCOUNTER — Ambulatory Visit (HOSPITAL_BASED_OUTPATIENT_CLINIC_OR_DEPARTMENT_OTHER): Payer: No Typology Code available for payment source | Admitting: Hematology

## 2015-10-11 ENCOUNTER — Encounter: Payer: Self-pay | Admitting: Hematology

## 2015-10-11 ENCOUNTER — Telehealth: Payer: Self-pay | Admitting: Hematology

## 2015-10-11 VITALS — BP 156/70 | HR 93 | Temp 98.8°F | Resp 19 | Wt 194.5 lb

## 2015-10-11 DIAGNOSIS — D509 Iron deficiency anemia, unspecified: Secondary | ICD-10-CM | POA: Diagnosis not present

## 2015-10-11 DIAGNOSIS — I2510 Atherosclerotic heart disease of native coronary artery without angina pectoris: Secondary | ICD-10-CM

## 2015-10-11 DIAGNOSIS — E119 Type 2 diabetes mellitus without complications: Secondary | ICD-10-CM | POA: Diagnosis not present

## 2015-10-11 NOTE — Telephone Encounter (Signed)
per pof to sch pt appt-cld [facility] to sch appt-was told pt needs to call Billing Dept b4 scheduling-sent Dr Burr Medico email to adv-cld pt to adv

## 2015-10-11 NOTE — Progress Notes (Signed)
[facility]  Telephone:([phone]) [phone] Fax:(336) [phone]  Clinic follow Up Note   Patient Care Team: Carol Ada, MD as PCP - General (Family Medicine) Sueanne Margarita, MD as Consulting Physician (Cardiology) 10/11/2015  CHIEF COMPLAINTS:  Follow up anemia   HISTORY OF PRESENTING ILLNESS:  [name] 65 y.o. female is here because of anemia.  She was found to have abnormal CBC since she was a child. She used to eat liver when she was a child. She does not remember her blood counts. She was not taking oral iron supplement until 6 months ago, she is on MVI with iron (18mg ) once daily. Her last CBC done by her PCP from 11/10/2014 showed WBC 10.2, normal differential, hemoglobin 10.1, hematocrit 31.0, MCV 83.9. Platelet count normal at 2 03/03/2014  She denies recent chest pain on exertion, she does have some shortness of breath on mild exertion, such as walking for a block, pre-syncopal episodes, or palpitations. She had not noticed any recent bleeding such as epistaxis, hematuria or hematochezia The patient takes aspirin 81mg  daily, no other NSAID Her last colonoscopy was sabout 5 years ago at [facility], she also had EGD, which all negative per patient.  She had no prior history or diagnosis of cancer. Her last screening mammogram was several years ago.  She denies any pica and eats a variety of diet. She never donated blood or received blood transfusion She had normal period, not heavy, and LMP was over 10 years ago.   She has some pain at left shoulder blade for a few years, no limited ROM of left shoulder. She otherwise feels well overball. 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   CURRENT THERAPY: iv Feraheme 510mg  weekly X2 as needed, she received on 03/08/15, 03/15/2015, ferrous sulfate 1 tablet daily  INTERIM HISTORY [name] returns for follow-up. She is here for herself. She is doing well overall, has mild fatigue, but  functions well at home. She takes care of her daughter who has health issue and does not drive, and her 4 grandchildren at home. She is very physically active. She reports mild tenderness in the left upper quadrant of abdomen, and moderate diarrhea, a few times a day for the past 2-3 months. She denies any melena or hematochezia. No nausea, abdominal bloating or other symptoms. She otherwise feels well, no weight loss. She is compliant with ferrous sulfate once daily and tolerates well.  MEDICAL HISTORY:  Past Medical History  Diagnosis Date  . Grave's disease   . Obesity   . High cholesterol   . Type II diabetes mellitus ([place])   . Myocardial infarction [facility]) ?2008  . Anemia   . GERD (gastroesophageal reflux disease)   . [id number])     "maybe once/month" (07/15/2013)  . Migraines     "couple times/yr"  (07/15/2013)  . Arthritis     "left hip"  (07/15/2013)  . Depression   . Retinopathy 12/08    right  . RLS (restless legs syndrome)   . History of ASCVD (atherosclerotic cardiovascular disease)     single vessel-Dr Radford Pax  . Osteopenia 12/2010 &01/21/2013    Minimal of hip DEXA   . Hypertension   . CAD (coronary artery disease)     a. s/p 2007  LAD w/ 3.5 x 16 mm Liberte stent b. s/p 07/2006 CABG with IMG to LAD and SVG to Diag. c. 12/2006 diagonal branch w/ 2.5 x 12 mm Endeavor stent.  d. s/p 2010 cath with small vessel disease treated with RX  e. 07/2013 s/p cath showing stable dx from 2010 with no interventions     SURGICAL HISTORY: Past Surgical History  Procedure Laterality Date  . Laparoscopic cholecystectomy  1990's  . Tubal ligation  1976  . Coronary artery bypass graft  ?2008    s/p two vessel CABG off pump LAD diagonal and PTCA/DE Stent mid LAD/ DE stent LAD diagonal through previous stent sstruts,occluded SVG-diagonal  . Coronary angioplasty with stent placement      "I've had quite a few stents"   . Cardiac catheterization  02/03/2008    for CP patent LIMA w mid-vessel  spasm.  . Colonoscopy  10/2010    rectal polyp  . Left heart catheterization with coronary angiogram N/A 07/15/2013    Procedure: LEFT HEART CATHETERIZATION WITH CORONARY ANGIOGRAM;  Surgeon: Peter M Martinique, MD;  Location[facility] C[facility] LAB;  Service: Cardiovascular;  Laterality: N/A;    SOCIAL HISTORY: Social History   Social History  . Marital Status: Married    Spouse Name: N/A  . Number of Children: N/A  . Years of Education: N/A   Occupational History  . Not on file.   Social History Main Topics  . Smoking status: Former Smoker    Quit date: 07/02/1989  . Smokeless tobacco: Never Used  . Alcohol Use: No  . Drug Use: No  . Sexual Activity: Yes   Other Topics Concern  . Not on file   Social History Narrative    FAMILY HISTORY: Family History  Problem Relation Age of Onset  . Cancer Father     Lung  . Hypertension Mother   . Cancer Mother     Lung  . Diabetes Mother   . Diabetes Maternal Grandmother   . Alzheimer's disease Maternal Grandmother     ALLERGIES:  has No Known Allergies.  MEDICATIONS:  Current Outpatient Prescriptions  Medication Sig Dispense Refill  . aspirin EC 81 MG tablet Take 1 tablet (81 mg total) by mouth daily.    Marland Kitchen atorvastatin (LIPITOR) 80 MG tablet Take 80 mg by mouth every evening.    . escitalopram (LEXAPRO) 10 MG tablet Take 10 mg by mouth daily.  1  . ferrous sulfate 325 (65 FE) MG EC tablet Take 325 mg by mouth daily with breakfast.    . hydrochlorothiazide (HYDRODIURIL) 25 MG tablet Take 25 mg by mouth daily.    . isosorbide mononitrate (IMDUR) 60 MG 24 hr tablet TAKE 1 TABLET (60 MG TOTAL) BY MOUTH DAILY. 30 tablet 1  . metFORMIN (GLUCOPHAGE) 1000 MG tablet Take 1 tablet (1,000 mg total) by mouth 2 (two) times daily with a meal. 30 tablet 6  . metoprolol tartrate (LOPRESSOR) 25 MG tablet Take 25 mg by mouth 2 (two) times daily.    . nitroGLYCERIN (NITROSTAT) 0.4 MG SL tablet Place 0.4 mg under the tongue every 5 (five) minutes as  needed for chest pain.    Marland Kitchen omeprazole (PRILOSEC) 40 MG capsule Take 40 mg by mouth daily.    . traMADol (ULTRAM) 50 MG tablet Take 1 tablet (50 mg total) by mouth every 6 (six) hours as needed. 15 tablet 0   No current facility-administered medications for this visit.    REVIEW OF SYSTEMS:   Constitutional: Denies fevers, chills or abnormal night sweats Eyes: Denies blurriness of vision, double vision or watery eyes Ears, nose, mouth, throat, and face: Denies mucositis or sore throat Respiratory: Denies cough,  dyspnea or wheezes Cardiovascular: Denies palpitation, chest discomfort or lower extremity swelling Gastrointestinal:  Denies nausea, heartburn or change in bowel habits Skin: Denies abnormal skin rashes Lymphatics: Denies new lymphadenopathy or easy bruising Neurological:Denies numbness, tingling or new weaknesses Behavioral/Psych: Mood is stable, no new changes  All other systems were reviewed with the patient and are negative.  PHYSICAL EXAMINATION: ECOG PERFORMANCE STATUS: 0  Filed Vitals:   10/11/15 0925  BP: 156/70  Pulse: 93  Temp: 98.8 F (37.1 C)  Resp: 19   Filed Weights   10/11/15 0925  Weight: 194 lb 8 oz (88.225 kg)    GENERAL:alert, no distress and comfortable SKIN: skin color, texture, turgor are normal, no rashes or significant lesions, except a few ecchymosis on her left arm and right leg.  EYES: normal, conjunctiva are pink and non-injected, sclera clear OROPHARYNX:no exudate, no erythema and lips, buccal mucosa, and tongue normal  NECK: supple, thyroid normal size, non-tender, without nodularity LYMPH:  no palpable lymphadenopathy in the cervical, axillary or inguinal LUNGS: clear to auscultation and percussion with normal breathing effort, scatter crackers on bilaterally lung basis HEART: regular rate & rhythm and no murmurs and no lower extremity edema ABDOMEN:abdomen soft, normal bowel sounds, (+) mild tenderness at the left upper quadrant of  abdomen, no organomegaly. Musculoskeletal:no cyanosis of digits and no clubbing, (+)  bilateral pitting edema up to any  PSYCH: alert & oriented x 3 with fluent speech NEURO: no focal motor/sensory deficits  LABORATORY DATA:  I have reviewed the data as listed CBC Latest Ref Rng 10/04/2015 08/13/2015 04/12/2015  WBC 3.9 - 10.3 10e3/uL 8.3 8.1 8.6  Hemoglobin 11.6 - 15.9 g/dL 12.9 12.1 11.2(L)  Hematocrit 34.8 - 46.6 % 37.1 36.1 34.2(L)  Platelets 145 - 400 10e3/uL 209 210 191    CMP Latest Ref Rng 08/13/2015 12/21/2014 12/03/2014  Glucose 65 - 99 mg/dL 144(H) 116 132(H)  BUN 6 - 20 mg/dL 15 29.8(H) 18  Creatinine 0.44 - 1.00 mg/dL 0.88 1.0 0.99  Sodium 135 - 145 mmol/L 140 140 138  Potassium 3.5 - 5.1 mmol/L 4.0 4.3 4.6  Chloride 101 - 111 mmol/L 101 - 102  CO2 22 - 32 mmol/L 27 26 29   Calcium 8.9 - 10.3 mg/dL 9.0 9.9 9.3  Total Protein 6.4 - 8.3 g/dL - 6.4 -  Total Bilirubin 0.20 - 1.20 mg/dL - 0.33 -  Alkaline Phos 40 - 150 U/L - 111 -  AST 5 - 34 U/L - 15 -  ALT 0 - 55 U/L - 18 -   Results for MAHEALANI, BENBROOK (MRN [id number]) as of 10/11/2015 06:52  Ref. Range 10/04/2015 09:35  Iron Latest Ref Range: 41-142 ug/dL 65  UIBC Latest Ref Range: 120-384 ug/dL 215  TIBC Latest Ref Range: 236-444 ug/dL 280  %SAT Latest Ref Range: 21-57 % 23  Ferritin Latest Ref Range: 9-269 ng/ml 152   RADIOGRAPHIC STUDIES: I have personally reviewed the radiological images as listed and agreed with the findings in the report. No results found.  ASSESSMENT & PLAN:  65 year old Caucasian female, with long-standing history of anemia since childhood (per patient), was referred for anemia evaluation.  1. Iron deficient anemia  -She had mild anemia was normal MCV and low reticulocyte count. -She had low serum iron level at 34, low transferrin saturation 9%, normal TIBC 372, ferritin 11. This is supportive for iron deficiency anemia, but her anemia is slightly outer portion of her iron deficiency. She may  have anemia of chronic  disease also. -Her folic acid and 123456 level were normal, SPEP were negative for M protein, TSH was normal, erythropoietin was elevated at 44.8, sedimentation rate was normal, and hemoglobin electrophoresis was normal. -She responded very well to IV Feraheme, her anemia has resolved. -I reviewed her lab results with her, she had a normal CBC and iron study from last week. -She will continue oral ferrous sulfate once daily -Continue monitoring her blood counts and iron level. -The etiology of iron deficient anemia is not clear, she does have mild diarrhea, left upper quadrant abdominal tenderness, her last colonoscopy was 5 years ago, done at [facility]. I refer her back for GI workup including repeated endoscopy.  2. Diabetes, CAD,  -She'll continue follow-up with her primary care physician   Plan -CBC and iron studies in 3 months -I'll see her back in 6 months with lab week before -repeat GI workup, I refer her back to [facility], I sent a message to Dr. Paulita Fujita also   All questions were answered. The patient knows to call the clinic with any problems, questions or concerns. I spent 25 minutes counseling the patient face to face. The total time spent in the appointment was 30 minutes and more than 50% was on counseling.     Truitt Merle, MD 10/11/2015 9:35 AM

## 2015-10-11 NOTE — Telephone Encounter (Signed)
per pof to sch pt appt-gave pt copy of avs °

## 2015-10-25 ENCOUNTER — Other Ambulatory Visit: Payer: Self-pay | Admitting: Cardiology

## 2015-10-26 ENCOUNTER — Telehealth: Payer: Self-pay | Admitting: Hematology

## 2015-10-26 NOTE — Telephone Encounter (Signed)
per staff message to call Ghristine (Dr. Paulita Fujita assisitant) to get appt scheduled w/Dr Outlaw-Christine to take care of

## 2015-11-30 ENCOUNTER — Other Ambulatory Visit: Payer: Self-pay | Admitting: Cardiology

## 2015-12-12 ENCOUNTER — Other Ambulatory Visit: Payer: Self-pay | Admitting: Cardiology

## 2015-12-13 ENCOUNTER — Other Ambulatory Visit: Payer: Self-pay

## 2015-12-13 MED ORDER — ISOSORBIDE MONONITRATE ER 60 MG PO TB24: 60.0000 mg | ORAL_TABLET | Freq: Every day | ORAL | Status: DC

## 2015-12-25 ENCOUNTER — Other Ambulatory Visit: Payer: Self-pay | Admitting: Cardiology

## 2015-12-27 NOTE — Telephone Encounter (Signed)
isosorbide mononitrate (IMDUR) 60 MG 24 hr tablet  Medication   Date: 12/13/2015  Department: [address] Office  Ordering/Authorizing: Sueanne Margarita, MD      Order Providers    Prescribing Provider Encounter Provider   Sueanne Margarita, MD Stephannie Peters, CMA    Medication Detail      Disp Refills Start End     isosorbide mononitrate (IMDUR) 60 MG 24 hr tablet 15 tablet 0 12/13/2015     Sig - Route: Take 1 tablet (60 mg total) by mouth daily. Patient is overdue for an appointment. Please call and schedule for further refills - Oral    Notes to Pharmacy: PATIENT NEEDS TO CALL OFFICE AND SCHEDULE APPT TO SEE DOCTOR BEFORE ANY FUTURE REFILLS WILL BE SENT. [phone].    E-Prescribing Status: Receipt confirmed by pharmacy (12/13/2015 8:53 AM EDT)     Pharmacy    [id number] - [place], [place] - [address]         Will route to Lenice Llamas per protocol, pt received last warning.

## 2016-01-11 ENCOUNTER — Other Ambulatory Visit (HOSPITAL_BASED_OUTPATIENT_CLINIC_OR_DEPARTMENT_OTHER): Payer: No Typology Code available for payment source

## 2016-01-11 DIAGNOSIS — D509 Iron deficiency anemia, unspecified: Secondary | ICD-10-CM | POA: Diagnosis not present

## 2016-01-11 DIAGNOSIS — D508 Other iron deficiency anemias: Secondary | ICD-10-CM

## 2016-01-11 LAB — IRON AND TIBC
%SAT: 20 % — ABNORMAL LOW (ref 21–57)
Iron: 59 ug/dL (ref 41–142)
TIBC: 290 ug/dL (ref 236–444)
UIBC: 231 ug/dL (ref 120–384)

## 2016-01-11 LAB — FERRITIN: Ferritin: 110 ng/ml (ref 9–269)

## 2016-02-24 ENCOUNTER — Ambulatory Visit: Payer: No Typology Code available for payment source | Admitting: Cardiology

## 2016-03-02 ENCOUNTER — Encounter: Payer: Self-pay | Admitting: Cardiology

## 2016-03-17 NOTE — Progress Notes (Signed)
Cardiology Office Note    Date:  03/19/2016   ID:  [name], DOB 11-30-1950, MRN [id number]  PCP:  Reginia Naas, MD  Cardiologist:  Fransico Him, MD   Chief Complaint  Patient presents with  . Coronary Artery Disease  . Hypertension    History of Present Illness:  [name] is a 65 y.o. female with a hx of ASCAD s/p remote CABG, HTN and dyslipidemia. Cardiac cath 07/2013 revealed single vessel ASCAD involving the mid LAD with a 90% mid LAD with the distal vessel filling competitvely by the IMA graft, patent LIMA to the LAD and chronically occluded SVG to the diagonal. She is on medical therapy but ran out of her meds.  She did not get them refilled because she needed new Rxs and owed a bill to her PCP and could not be seen until she paid it.  She thinks she has been off all meds for 4 months.  She has been having some heaviness on her chest along with sharp pain.  The heaviness will be constant for a few days and then resolve.  It is nonexertional.   She continues to have chronic dyspnea on exertion which has worsened since being off her meds. She has 2 pillow orthopnea and has some PND.  She denies syncope or palpitations . She has chronic LE edema at the end of the day which is stable.  She has had some problems with dizziness mainly with balance.   Past Medical History:  Diagnosis Date  . Anemia   . Arthritis    "left hip"  (07/15/2013)  . CAD (coronary artery disease)    a. s/p 2007  LAD w/ 3.5 x 16 mm Liberte stent b. s/p 07/2006 CABG with IMG to LAD and SVG to Diag. c. 12/2006 diagonal branch w/ 2.5 x 12 mm Endeavor stent. d. s/p 2010 cath with small vessel disease treated with RX  e. 07/2013 s/p cath showing stable dx from 2010 with no interventions   . Depression   . GERD (gastroesophageal reflux disease)   . Grave's disease   . [id number])    "maybe once/month" (07/15/2013)  . High cholesterol   . History of ASCVD (atherosclerotic cardiovascular  disease)    single vessel-Dr Radford Pax  . Hypertension   . Migraines    "couple times/yr"  (07/15/2013)  . Myocardial infarction [facility]) ?2008  . Obesity   . Osteopenia 12/2010 &01/21/2013   Minimal of hip DEXA   . Retinopathy 12/08   right  . RLS (restless legs syndrome)   . Type II diabetes mellitus ([place])     Past Surgical History:  Procedure Laterality Date  . CARDIAC CATHETERIZATION  02/03/2008   for CP patent LIMA w mid-vessel spasm.  . COLONOSCOPY  10/2010   rectal polyp  . CORONARY ANGIOPLASTY WITH STENT PLACEMENT     "I've had quite a few stents"   . CORONARY ARTERY BYPASS GRAFT  ?2008   s/p two vessel CABG off pump LAD diagonal and PTCA/DE Stent mid LAD/ DE stent LAD diagonal through previous stent sstruts,occluded SVG-diagonal  . LAPAROSCOPIC CHOLECYSTECTOMY  1990's  . LEFT HEART CATHETERIZATION WITH CORONARY ANGIOGRAM N/A 07/15/2013   Procedure: LEFT HEART CATHETERIZATION WITH CORONARY ANGIOGRAM;  Surgeon: Peter M Martinique, MD;  Location: [facility] CATH LAB;  Service: Cardiovascular;  Laterality: N/A;  . TUBAL LIGATION  1976    Current Medications: Outpatient Medications Prior to Visit  Medication Sig Dispense Refill  . aspirin EC  81 MG tablet Take 1 tablet (81 mg total) by mouth daily.    Marland Kitchen atorvastatin (LIPITOR) 80 MG tablet Take 80 mg by mouth every evening.    . escitalopram (LEXAPRO) 10 MG tablet Take 10 mg by mouth daily.  1  . ferrous sulfate 325 (65 FE) MG EC tablet Take 325 mg by mouth daily with breakfast.    . hydrochlorothiazide (HYDRODIURIL) 25 MG tablet Take 25 mg by mouth daily.    . isosorbide mononitrate (IMDUR) 60 MG 24 hr tablet Take 1 tablet (60 mg total) by mouth daily. 30 tablet 2  . metFORMIN (GLUCOPHAGE) 1000 MG tablet Take 1 tablet (1,000 mg total) by mouth 2 (two) times daily with a meal. 30 tablet 6  . metoprolol tartrate (LOPRESSOR) 25 MG tablet Take 25 mg by mouth 2 (two) times daily.    . nitroGLYCERIN (NITROSTAT) 0.4 MG SL tablet Place 0.4 mg under the  tongue every 5 (five) minutes as needed for chest pain.    Marland Kitchen omeprazole (PRILOSEC) 40 MG capsule Take 40 mg by mouth daily.    . traMADol (ULTRAM) 50 MG tablet Take 1 tablet (50 mg total) by mouth every 6 (six) hours as needed. 15 tablet 0   No facility-administered medications prior to visit.      Allergies:   Review of patient's allergies indicates no known allergies.   Social History   Social History  . Marital status: Married    Spouse name: N/A  . Number of children: N/A  . Years of education: N/A   Social History Main Topics  . Smoking status: Former Smoker    Quit date: 07/02/1989  . Smokeless tobacco: Never Used  . Alcohol use No  . Drug use: No  . Sexual activity: Yes   Other Topics Concern  . None   Social History Narrative  . None     Family History:  The patient's family history includes Alzheimer's disease in her maternal grandmother; Cancer in her father and mother; Diabetes in her maternal grandmother and mother; Hypertension in her mother.   ROS:   Please see the history of present illness.    ROS All other systems reviewed and are negative.  No flowsheet data found.     PHYSICAL EXAM:   VS:  BP (!) 180/102 (BP Location: Left Arm, Patient Position: Sitting, Cuff Size: Large)   Pulse (!) 103   Ht 4' 11.5" (1.511 m)   Wt 184 lb 12.8 oz (83.8 kg)   SpO2 96%   BMI 36.70 kg/m    GEN: Well nourished, well developed, in no acute distress  HEENT: normal  Neck: no JVD, carotid bruits, or masses Cardiac: RRR; no murmurs, rubs, or gallops,no edema.  Intact distal pulses bilaterally.  Respiratory:  clear to auscultation bilaterally, normal work of breathing GI: soft, nontender, nondistended, + BS MS: no deformity or atrophy  Skin: warm and dry, no rash Neuro:  Alert and Oriented x 3, Strength and sensation are intact Psych: euthymic mood, full affect  Wt Readings from Last 3 Encounters:  03/19/16 184 lb 12.8 oz (83.8 kg)  10/11/15 194 lb 8 oz (88.2  kg)  08/13/15 190 lb (86.2 kg)      Studies/Labs Reviewed:   EKG:  EKG is ordered today.  The ekg ordered today demonstrates NSR at 96bpm with nonspecific ST abnormality  Recent Labs: 08/13/2015: BUN 15; Creatinine, Ser 0.88; Potassium 4.0; Sodium 140 10/04/2015: HGB 12.9; Platelets 209   Lipid Panel  Component Value Date/Time   CHOL 125 08/26/2014 0734   TRIG 102.0 08/26/2014 0734   HDL 38.00 (L) 08/26/2014 0734   CHOLHDL 3 08/26/2014 0734   VLDL 20.4 08/26/2014 0734   LDLCALC 67 08/26/2014 0734    Additional studies/ records that were reviewed today include:  none    ASSESSMENT:    1. Coronary artery disease due to lipid rich plaque   2. Essential hypertension   3. Hyperlipidemia   4. Diabetes mellitus type 2, noninsulin dependent ([place])      PLAN:  In order of problems listed above:  1. ASCAD s/p remote CABG with last cath 07/2013 revealing single vessel ASCAD involving the mid LAD with a 90% mid LAD with the distal vessel filling competitvely by the IMA graft, patent LIMA to the LAD and chronically occluded SVG to the diagonal. She has been having chest heaviness that will last days at a time and is nonexertional. This is completely different from her typical angina and I suspect is due to endocardial ischemia from poorly controlled HTN.  I will restart her meds with new refills including  ASA/statin/long acting nitrate and BB.  She will be seen back by my PA in 2 weeks to make sure her CP has resolved with adequate control of BP.  2. HTN - BP is poorly controlled due to being off meds. Restart BB and diuretic. Check BMET in 1 week and followup in HTN clinic to make sure her BP is controlled..  3. Hyperlipidemia with LDL goal < 70. Restart statin.  Check FLP and ALT in 6 weeks. 4. DM - I will restart her metformin and check HbA1C.    Medication Adjustments/Labs and Tests Ordered: Current medicines are reviewed at length with the patient today.  Concerns regarding  medicines are outlined above.  Medication changes, Labs and Tests ordered today are listed in the Patient Instructions below.  There are no Patient Instructions on file for this visit.   Signed, Fransico Him, MD  03/19/2016 9:50 AM    [facility], [place], [place]  [address] Phone: [phone]; Fax: [phone]

## 2016-03-19 ENCOUNTER — Ambulatory Visit (INDEPENDENT_AMBULATORY_CARE_PROVIDER_SITE_OTHER): Payer: No Typology Code available for payment source | Admitting: Cardiology

## 2016-03-19 ENCOUNTER — Encounter (INDEPENDENT_AMBULATORY_CARE_PROVIDER_SITE_OTHER): Payer: Self-pay

## 2016-03-19 ENCOUNTER — Encounter: Payer: Self-pay | Admitting: Cardiology

## 2016-03-19 VITALS — BP 180/102 | HR 103 | Ht 59.5 in | Wt 184.8 lb

## 2016-03-19 DIAGNOSIS — E785 Hyperlipidemia, unspecified: Secondary | ICD-10-CM

## 2016-03-19 DIAGNOSIS — E119 Type 2 diabetes mellitus without complications: Secondary | ICD-10-CM

## 2016-03-19 DIAGNOSIS — I2583 Coronary atherosclerosis due to lipid rich plaque: Principal | ICD-10-CM

## 2016-03-19 DIAGNOSIS — I10 Essential (primary) hypertension: Secondary | ICD-10-CM | POA: Diagnosis not present

## 2016-03-19 DIAGNOSIS — I2510 Atherosclerotic heart disease of native coronary artery without angina pectoris: Secondary | ICD-10-CM

## 2016-03-19 MED ORDER — ATORVASTATIN CALCIUM 80 MG PO TABS: 80.0000 mg | ORAL_TABLET | Freq: Every evening | ORAL | 3 refills | Status: DC

## 2016-03-19 MED ORDER — METOPROLOL TARTRATE 25 MG PO TABS: 25.0000 mg | ORAL_TABLET | Freq: Two times a day (BID) | ORAL | 3 refills | Status: DC

## 2016-03-19 MED ORDER — HYDROCHLOROTHIAZIDE 25 MG PO TABS: 25.0000 mg | ORAL_TABLET | Freq: Every day | ORAL | 3 refills | Status: DC

## 2016-03-19 MED ORDER — METFORMIN HCL 1000 MG PO TABS: 1000.0000 mg | ORAL_TABLET | Freq: Two times a day (BID) | ORAL | 3 refills | Status: DC

## 2016-03-19 MED ORDER — FERROUS SULFATE 325 (65 FE) MG PO TBEC: 325.0000 mg | DELAYED_RELEASE_TABLET | Freq: Every day | ORAL | 3 refills | Status: DC

## 2016-03-19 MED ORDER — ESCITALOPRAM OXALATE 10 MG PO TABS: 10.0000 mg | ORAL_TABLET | Freq: Every day | ORAL | 3 refills | Status: DC

## 2016-03-19 MED ORDER — ASPIRIN EC 81 MG PO TBEC: 81.0000 mg | DELAYED_RELEASE_TABLET | Freq: Every day | ORAL | Status: DC

## 2016-03-19 MED ORDER — OMEPRAZOLE 40 MG PO CPDR: 40.0000 mg | DELAYED_RELEASE_CAPSULE | Freq: Every day | ORAL | 3 refills | Status: DC

## 2016-03-19 MED ORDER — ISOSORBIDE MONONITRATE ER 60 MG PO TB24: 60.0000 mg | ORAL_TABLET | Freq: Every day | ORAL | 3 refills | Status: DC

## 2016-03-19 NOTE — Patient Instructions (Signed)
Medication Instructions:  None  Labwork: Your physician recommends that you return for lab work in: 1 week (BMET, A1C) on the same day as your Lipid Clinic appointment.  Your physician recommends that you return for lab work in: 6 weeks (Lipids, LFTs)   Testing/Procedures: None  Follow-Up: Your physician recommends that you schedule a follow-up appointment in: 1 week with Lipid Clinic.  Your physician recommends that you schedule a follow-up appointment in: 3 weeks with a PA/NP  Your physician wants you to follow-up in: 6 months with Dr. Radford Pax. You will receive a reminder letter in the mail two months in advance. If you don't receive a letter, please call our office to schedule the follow-up appointment.    Any Other Special Instructions Will Be Listed Below (If Applicable).     If you need a refill on your cardiac medications before your next appointment, please call your pharmacy.

## 2016-03-27 NOTE — Progress Notes (Signed)
Patient ID: [name]                 DOB: 07/31/1950                      MRN: [id number]     HPI: [name] is a 65 y.o. female referred by Dr. Radford Pax to HTN clinic. PMH is significant for ASCVD s/p remote CABG, HTN, HLD, and obesity. Pt was seen by Dr. Radford Pax 1 week ago in clinic and BP was elevated to 180/112mmHg. She had been out of all of her medications for the past 4 months. Previous doses of HCTZ, metoprolol, and Imdur were resumed a week ago and pt presents today for follow up.  Patient reports she is dizzy at baseline. This has not been worse or better over the past week since resuming meds. She does reports a history of frequent headaches and states this has improved since restarting her BP medications. She has only had 1 headache in the past week which is an improvement. Denies  falls or blurred vision. Of note, she did not take any of her medications this morning.  Current HTN meds: HCTZ 25mg  daily, metoprolol tartrate 25mg  BID, Imdur 60mg  daily  BP goal: <140/45mmHg  Family History: Mother with HTN and DM. Maternal grandmother with DM and Alzheimer's disease. Mother and father with lung cancer.  Social History: Former smoker - quit 07/02/1989, Denies alcohol use and illicit drug use.  Diet: Cereal or oatmeal for breakfast, pinto beans for lunch. Drinks diet coke or diet dr pepper.   Exercise: Stays active walking throughout the day   Home BP readings: Has misplaced her BP cuff so she has been unable to check her BP over the past week   Wt Readings from Last 3 Encounters:  03/19/16 184 lb 12.8 oz (83.8 kg)  10/11/15 194 lb 8 oz (88.2 kg)  08/13/15 190 lb (86.2 kg)   BP Readings from Last 3 Encounters:  03/19/16 (!) 180/102  10/11/15 (!) 156/70  08/13/15 132/61   Pulse Readings from Last 3 Encounters:  03/19/16 (!) 103  10/11/15 93  08/13/15 70    Renal function: CrCl cannot be calculated (Patient's most recent lab result is older than the maximum 21  days allowed.).  Past Medical History:  Diagnosis Date  . Anemia   . Arthritis    "left hip"  (07/15/2013)  . CAD (coronary artery disease)    a. s/p 2007  LAD w/ 3.5 x 16 mm Liberte stent b. s/p 07/2006 CABG with IMG to LAD and SVG to Diag. c. 12/2006 diagonal branch w/ 2.5 x 12 mm Endeavor stent. d. s/p 2010 cath with small vessel disease treated with RX  e. 07/2013 s/p cath showing stable dx from 2010 with no interventions   . Depression   . GERD (gastroesophageal reflux disease)   . Grave's disease   . [id number])    "maybe once/month" (07/15/2013)  . High cholesterol   . History of ASCVD (atherosclerotic cardiovascular disease)    single vessel-Dr Radford Pax  . Hypertension   . Migraines    "couple times/yr"  (07/15/2013)  . Myocardial infarction [facility]) ?2008  . Obesity   . Osteopenia 12/2010 &01/21/2013   Minimal of hip DEXA   . Retinopathy 12/08   right  . RLS (restless legs syndrome)   . Type II diabetes mellitus ([place])     Current Outpatient Prescriptions on File Prior to Visit  Medication Sig  Dispense Refill  . aspirin EC 81 MG tablet Take 1 tablet (81 mg total) by mouth daily.    Marland Kitchen atorvastatin (LIPITOR) 80 MG tablet Take 1 tablet (80 mg total) by mouth every evening. 90 tablet 3  . escitalopram (LEXAPRO) 10 MG tablet Take 1 tablet (10 mg total) by mouth daily. 90 tablet 3  . ferrous sulfate 325 (65 FE) MG EC tablet Take 1 tablet (325 mg total) by mouth daily with breakfast. 90 tablet 3  . hydrochlorothiazide (HYDRODIURIL) 25 MG tablet Take 1 tablet (25 mg total) by mouth daily. 90 tablet 3  . isosorbide mononitrate (IMDUR) 60 MG 24 hr tablet Take 1 tablet (60 mg total) by mouth daily. 90 tablet 3  . metFORMIN (GLUCOPHAGE) 1000 MG tablet Take 1 tablet (1,000 mg total) by mouth 2 (two) times daily with a meal. 90 tablet 3  . metoprolol tartrate (LOPRESSOR) 25 MG tablet Take 1 tablet (25 mg total) by mouth 2 (two) times daily. 180 tablet 3  . nitroGLYCERIN (NITROSTAT) 0.4 MG SL  tablet Place 0.4 mg under the tongue every 5 (five) minutes as needed for chest pain.    Marland Kitchen omeprazole (PRILOSEC) 40 MG capsule Take 1 capsule (40 mg total) by mouth daily. 90 capsule 3  . traMADol (ULTRAM) 50 MG tablet Take 1 tablet (50 mg total) by mouth every 6 (six) hours as needed. 15 tablet 0   No current facility-administered medications on file prior to visit.     No Known Allergies   Assessment/Plan:  1. Hypertension - BP much improved and at goal <140/11mmHg since resuming previous doses of HCTZ, metoprolol, and Imdur 1 week ago. Her headaches have improved as well. No medication changes made today, especially since pt at BP goal without taking her morning BP medications today. She has follow up with an APP in 2 weeks.   Megan E. Supple, PharmD, [place] [id number], [place],  [address] Phone: [phone]; Fax: [phone] 03/28/2016 8:20 AM

## 2016-03-28 ENCOUNTER — Ambulatory Visit (INDEPENDENT_AMBULATORY_CARE_PROVIDER_SITE_OTHER): Payer: Medicare Other | Admitting: Pharmacist

## 2016-03-28 ENCOUNTER — Encounter: Payer: Self-pay | Admitting: Pharmacist

## 2016-03-28 ENCOUNTER — Other Ambulatory Visit: Payer: No Typology Code available for payment source | Admitting: *Deleted

## 2016-03-28 VITALS — BP 138/82 | HR 72 | Ht 59.5 in | Wt 181.5 lb

## 2016-03-28 DIAGNOSIS — E785 Hyperlipidemia, unspecified: Secondary | ICD-10-CM

## 2016-03-28 DIAGNOSIS — I10 Essential (primary) hypertension: Secondary | ICD-10-CM

## 2016-03-28 DIAGNOSIS — I2510 Atherosclerotic heart disease of native coronary artery without angina pectoris: Secondary | ICD-10-CM | POA: Diagnosis not present

## 2016-03-28 DIAGNOSIS — E119 Type 2 diabetes mellitus without complications: Secondary | ICD-10-CM

## 2016-03-28 DIAGNOSIS — I2583 Coronary atherosclerosis due to lipid rich plaque: Principal | ICD-10-CM

## 2016-03-28 LAB — BASIC METABOLIC PANEL
BUN: 18 mg/dL (ref 7–25)
CALCIUM: 8.9 mg/dL (ref 8.6–10.4)
CO2: 25 mmol/L (ref 20–31)
Chloride: 99 mmol/L (ref 98–110)
Creat: 0.9 mg/dL (ref 0.50–0.99)
GLUCOSE: 184 mg/dL — AB (ref 65–99)
POTASSIUM: 3.9 mmol/L (ref 3.5–5.3)
SODIUM: 137 mmol/L (ref 135–146)

## 2016-03-28 LAB — LIPID PANEL
Cholesterol: 153 mg/dL (ref 125–200)
HDL: 34 mg/dL — ABNORMAL LOW (ref >=46)
LDL CALC: 57 mg/dL (ref <130)
Total CHOL/HDL Ratio: 4.5 Ratio (ref <=5.0)
Triglycerides: 308 mg/dL — ABNORMAL HIGH (ref <150)
VLDL: 62 mg/dL — ABNORMAL HIGH (ref <30)

## 2016-03-28 LAB — HEPATIC FUNCTION PANEL
ALK PHOS: 135 U/L — AB (ref 33–130)
ALT: 12 U/L (ref 6–29)
AST: 13 U/L (ref 10–35)
Albumin: 3.8 g/dL (ref 3.6–5.1)
BILIRUBIN INDIRECT: 0.5 mg/dL (ref 0.2–1.2)
BILIRUBIN TOTAL: 0.6 mg/dL (ref 0.2–1.2)
Bilirubin, Direct: 0.1 mg/dL (ref <=0.2)
Total Protein: 5.9 g/dL — ABNORMAL LOW (ref 6.1–8.1)

## 2016-03-29 LAB — HEMOGLOBIN A1C
HEMOGLOBIN A1C: 10 % — AB (ref <5.7)
MEAN PLASMA GLUCOSE: 240 mg/dL

## 2016-04-08 ENCOUNTER — Encounter: Payer: Self-pay | Admitting: Physician Assistant

## 2016-04-08 NOTE — Progress Notes (Signed)
 Cardiology Office Note    Date:  04/09/2016  ID:  [name], DOB 08/20/1950, MRN [id number] PCP:  SMITH,CANDACE THIELE, MD  Cardiologist:  Turner   Chief Complaint: f/u chest pain  History of Present Illness:  [name] is a 65 y.o. female with history of CAD (LAD stent 2007, CABG in 07/2006, Endeavor stent to diagonal 12/2006, cath 2010 with small vessel dz, stable cath 07/2013), depression, GERD, Graves disease, HTN, migraines, anemia, arthritis, obesity, retinopathy, DM, RLS, chronic LE (at end of the day), chronic combined CHF per echo 2016 who presents for f/u of chest pain. Cardiac cath 07/2013 revealed single vessel ASCAD involving the mid LAD with a 90% mid LAD with the distal vessel filling competitvely by the IMA graft, patent LIMA to the LAD and chronically occluded SVG to the diagonal. Last echo 12/2014 showed EF 40-45%, grade 1 DD, mild LAE. She has been on medical therapy but recently ran out of her meds for the past 4 months - needed new rx's and owed a bill to PCP and could not be seen until she paid it. Saw Dr. Turner 03/19/16 at which time she was reporting some chest heaviness, different than prior angina, nonexertional. It was felt possibly related to her uncontrolled BP. She was also reporting chronic dependent LEE at the end of the day and chronic dizziness with balance. Beta blocker, diuretic, statin, metformin were restarted and Lovaza was added. Labs 03/28/16: BUN 18, Cr 0.9, glu 184, K 3.9, A1C 10, trig 308, HDL 34, LDL 57, alk phos 135, Tprot 5.9 otherwise LFTs OK. Saw HTN clinic on 03/28/16 at which time BP improved to 138/82 (prev 180/120).  She returns to clinic for follow-up today. BP 144/72. She still has occasional episodes of chest pressure that occur out of the blue, sometimes 1-2x a day, feeling like something sitting on her chest and in the top of her back. There is no rhyme or reason to the episodes. It can happen both at rest and with exertion but does not  happen every time she exerts herself. It is not positional or pleuritic.She walked all the way from parking lot to clinic today without any symptoms at all. The back discomfort feels possibly like prior angina except in the past this happened with exertion and she also had associated SOB which is not the case now. This time around she denies any SOB or diaphoresis. CP resolves after 20 minutes of lying down. Her NTG is out of date. No recent syncope. No LEE noted on exam today.    Past Medical History:  Diagnosis Date  . Anemia   . Arthritis    "left hip"  (07/15/2013)  . CAD (coronary artery disease)    a. s/p 2007  LAD w/ 3.5 x 16 mm Liberte stent b. s/p 07/2006 CABG with IMG to LAD and SVG to Diag. c. 12/2006 diagonal branch w/ 2.5 x 12 mm Endeavor stent. d. s/p 2010 cath with small vessel disease treated with RX  e. 07/2013 s/p cath showing stable dx from 2010 with no interventions   . Chronic combined systolic and diastolic CHF (congestive heart failure) (HCC)   . Depression   . GERD (gastroesophageal reflux disease)   . Grave's disease   . Headache(784.0)    "maybe once/month" (07/15/2013)  . High cholesterol   . Hypertension   . Migraines    "couple times/yr"  (07/15/2013)  . Myocardial infarction ?2008  . Obesity   . Osteopenia   12/2010 &01/21/2013   Minimal of hip DEXA   . Retinopathy 12/08   right  . RLS (restless legs syndrome)   . Type II diabetes mellitus (HCC)     Past Surgical History:  Procedure Laterality Date  . CARDIAC CATHETERIZATION  02/03/2008   for CP patent LIMA w mid-vessel spasm.  . COLONOSCOPY  10/2010   rectal polyp  . CORONARY ANGIOPLASTY WITH STENT PLACEMENT     "I've had quite a few stents"   . CORONARY ARTERY BYPASS GRAFT  ?2008   s/p two vessel CABG off pump LAD diagonal and PTCA/DE Stent mid LAD/ DE stent LAD diagonal through previous stent sstruts,occluded SVG-diagonal  . LAPAROSCOPIC CHOLECYSTECTOMY  1990's  . LEFT HEART CATHETERIZATION WITH CORONARY  ANGIOGRAM N/A 07/15/2013   Procedure: LEFT HEART CATHETERIZATION WITH CORONARY ANGIOGRAM;  Surgeon: Peter M Jordan, MD;  Location: [facility] CATH LAB;  Service: Cardiovascular;  Laterality: N/A;  . TUBAL LIGATION  1976    Current Medications: Current Outpatient Prescriptions  Medication Sig Dispense Refill  . aspirin EC 81 MG tablet Take 1 tablet (81 mg total) by mouth daily.    . atorvastatin (LIPITOR) 80 MG tablet Take 1 tablet (80 mg total) by mouth every evening. 90 tablet 3  . escitalopram (LEXAPRO) 10 MG tablet Take 1 tablet (10 mg total) by mouth daily. 90 tablet 3  . ferrous sulfate 325 (65 FE) MG EC tablet Take 1 tablet (325 mg total) by mouth daily with breakfast. 90 tablet 3  . hydrochlorothiazide (HYDRODIURIL) 25 MG tablet Take 1 tablet (25 mg total) by mouth daily. 90 tablet 3  . isosorbide mononitrate (IMDUR) 60 MG 24 hr tablet Take 1 tablet (60 mg total) by mouth daily. 90 tablet 3  . metFORMIN (GLUCOPHAGE) 1000 MG tablet Take 1 tablet (1,000 mg total) by mouth 2 (two) times daily with a meal. 90 tablet 3  . metoprolol tartrate (LOPRESSOR) 25 MG tablet Take 1 tablet (25 mg total) by mouth 2 (two) times daily. 180 tablet 3  . nitroGLYCERIN (NITROSTAT) 0.4 MG SL tablet Place 0.4 mg under the tongue every 5 (five) minutes as needed for chest pain.    . omeprazole (PRILOSEC) 40 MG capsule Take 1 capsule (40 mg total) by mouth daily. 90 capsule 3   No current facility-administered medications for this visit.      Allergies:   Review of patient's allergies indicates no known allergies.   Social History   Social History  . Marital status: Married    Spouse name: N/A  . Number of children: N/A  . Years of education: N/A   Social History Main Topics  . Smoking status: Former Smoker    Quit date: 07/02/1989  . Smokeless tobacco: Never Used  . Alcohol use No  . Drug use: No  . Sexual activity: Yes   Other Topics Concern  . None   Social History Narrative  . None     Family  History:  The patient's family history includes Alzheimer's disease in her maternal grandmother; Cancer in her father and mother; Diabetes in her maternal grandmother and mother; Hypertension in her mother.   ROS:   Please see the history of present illness. All other systems are reviewed and otherwise negative.    PHYSICAL EXAM:   VS:  BP (!) 144/70   Pulse 74   Ht 4' 11" (1.499 m)   Wt 183 lb 6.4 oz (83.2 kg)   SpO2 97%   BMI 37.04 kg/m     BMI: Body mass index is 37.04 kg/m. GEN: Well nourished, well developed obese WF, in no acute distress  HEENT: normocephalic, atraumatic Neck: no JVD, carotid bruits, or masses Cardiac: RRR; no murmurs, rubs, or gallops, no edema  Respiratory:  clear to auscultation bilaterally, normal work of breathing GI: soft, nontender, nondistended, + BS MS: no deformity or atrophy  Skin: warm and dry, no rash Neuro:  Alert and Oriented x 3, Strength and sensation are intact, follows commands Psych: euthymic mood, full affect  Wt Readings from Last 3 Encounters:  04/09/16 183 lb 6.4 oz (83.2 kg)  03/28/16 181 lb 8 oz (82.3 kg)  03/19/16 184 lb 12.8 oz (83.8 kg)      Studies/Labs Reviewed:   EKG:  EKG was ordered today and personally reviewed by me and demonstrates NSR 71bpm, nonspecific T wave changes  Recent Labs: 10/04/2015: HGB 12.9; Platelets 209 03/28/2016: ALT 12; BUN 18; Creat 0.90; Potassium 3.9; Sodium 137   Lipid Panel    Component Value Date/Time   CHOL 153 03/28/2016 0758   TRIG 308 (H) 03/28/2016 0758   HDL 34 (L) 03/28/2016 0758   CHOLHDL 4.5 03/28/2016 0758   VLDL 62 (H) 03/28/2016 0758   LDLCALC 57 03/28/2016 0758    Additional studies/ records that were reviewed today include: Summarized above.    ASSESSMENT & PLAN:   1. Chest pressure - mixed typical/atypical symptoms. Discussed stress test vs cath with patient. At this time will proceed with Lexiscan nuclear stress test to evaluate for ischemia. If abnormal will need  definitive heart cath. Will also titrate BP medication today. Continue PPI. Will rx SL NTG PRN for patient to try. Warning sx reviewed with patient. 2. CAD s/p CABG, PCI - obtaining Lexiscan as above. Continue ASA, BB, statin. 3. Essential HTN - see above, adding ACEI. Repeat BMET 1 week after initiation. 4. Chronic combined CHF - appears euvolemic on exam. Add ACEI as above, not only for h/o LV dysfunction, but also for DM and HTN. Cost has been an issue for her in the past so I've chosen not to repeat an echocardiogram at this time - will await EF assessment by nuclear stress test. 5. Mixed hyperlipidemia - if tolerating statin at time of f/u would consider repeating lipids/LFTs at that time.   Disposition: F/u with me or Dr. Turner after stress test.  Medication Adjustments/Labs and Tests Ordered: Current medicines are reviewed at length with the patient today.  Concerns regarding medicines are outlined above. Medication changes, Labs and Tests ordered today are summarized above and listed in the Patient Instructions accessible in Encounters.   Signed, Dayna Dunn PA-C  04/09/2016 9:39 AM    [facility] [address], [place],   [address] Phone: [phone]; Fax: [phone]  

## 2016-04-09 ENCOUNTER — Ambulatory Visit (INDEPENDENT_AMBULATORY_CARE_PROVIDER_SITE_OTHER): Payer: No Typology Code available for payment source | Admitting: Physician Assistant

## 2016-04-09 ENCOUNTER — Encounter: Payer: Self-pay | Admitting: Physician Assistant

## 2016-04-09 VITALS — BP 144/70 | HR 74 | Ht 59.0 in | Wt 183.4 lb

## 2016-04-09 DIAGNOSIS — I10 Essential (primary) hypertension: Secondary | ICD-10-CM

## 2016-04-09 DIAGNOSIS — R0789 Other chest pain: Secondary | ICD-10-CM | POA: Diagnosis not present

## 2016-04-09 DIAGNOSIS — I2510 Atherosclerotic heart disease of native coronary artery without angina pectoris: Secondary | ICD-10-CM | POA: Diagnosis not present

## 2016-04-09 DIAGNOSIS — I5042 Chronic combined systolic (congestive) and diastolic (congestive) heart failure: Secondary | ICD-10-CM

## 2016-04-09 DIAGNOSIS — E782 Mixed hyperlipidemia: Secondary | ICD-10-CM

## 2016-04-09 MED ORDER — NITROGLYCERIN 0.4 MG SL SUBL: 0.4000 mg | SUBLINGUAL_TABLET | SUBLINGUAL | 3 refills | Status: DC | PRN

## 2016-04-09 MED ORDER — LISINOPRIL 5 MG PO TABS: 5.0000 mg | ORAL_TABLET | Freq: Every day | ORAL | 3 refills | Status: DC

## 2016-04-09 NOTE — Patient Instructions (Signed)
**Note De-Identified Kyna Blahnik Obfuscation** Medication Instructions:  Start taking Lisinopril 5 mg daily. All other medications remain the same.  Labwork: BMET on same day as [place].  Testing/Procedures: Your physician has requested that you have a lexiscan myoview. For further information please visit [email or web address]. Please follow instruction sheet, as given.   Follow-Up: After Lexiscan     If you need a refill on your cardiac medications before your next appointment, please call your pharmacy.

## 2016-04-10 ENCOUNTER — Telehealth (HOSPITAL_COMMUNITY): Payer: Self-pay | Admitting: *Deleted

## 2016-04-10 NOTE — Telephone Encounter (Signed)
Patient given detailed instructions per Myocardial Perfusion Study Information Sheet for the test on 04/11/16. Patient notified to arrive 15 minutes early and that it is imperative to arrive on time for appointment to keep from having the test rescheduled.  If you need to cancel or reschedule your appointment, please call the office within 24 hours of your appointment. Failure to do so may result in a cancellation of your appointment, and a $50 no show fee. Patient verbalized understanding. Kirstie Peri

## 2016-04-12 ENCOUNTER — Other Ambulatory Visit: Payer: No Typology Code available for payment source | Admitting: *Deleted

## 2016-04-12 ENCOUNTER — Ambulatory Visit (HOSPITAL_COMMUNITY): Payer: No Typology Code available for payment source | Attending: Cardiology

## 2016-04-12 DIAGNOSIS — R0789 Other chest pain: Secondary | ICD-10-CM | POA: Diagnosis not present

## 2016-04-12 DIAGNOSIS — R931 Abnormal findings on diagnostic imaging of heart and coronary circulation: Secondary | ICD-10-CM | POA: Insufficient documentation

## 2016-04-12 DIAGNOSIS — I2510 Atherosclerotic heart disease of native coronary artery without angina pectoris: Secondary | ICD-10-CM

## 2016-04-12 DIAGNOSIS — I10 Essential (primary) hypertension: Secondary | ICD-10-CM

## 2016-04-12 LAB — LIPID PANEL
CHOL/HDL RATIO: 3.8 ratio (ref <=5.0)
CHOLESTEROL: 148 mg/dL (ref 125–200)
HDL: 39 mg/dL — AB (ref >=46)
LDL Cholesterol: 67 mg/dL (ref <130)
Triglycerides: 208 mg/dL — ABNORMAL HIGH (ref <150)
VLDL: 42 mg/dL — AB (ref <30)

## 2016-04-12 LAB — HEPATIC FUNCTION PANEL
ALBUMIN: 3.7 g/dL (ref 3.6–5.1)
ALT: 11 U/L (ref 6–29)
AST: 15 U/L (ref 10–35)
Alkaline Phosphatase: 123 U/L (ref 33–130)
BILIRUBIN DIRECT: 0.1 mg/dL (ref <=0.2)
BILIRUBIN TOTAL: 0.6 mg/dL (ref 0.2–1.2)
Indirect Bilirubin: 0.5 mg/dL (ref 0.2–1.2)
Total Protein: 5.8 g/dL — ABNORMAL LOW (ref 6.1–8.1)

## 2016-04-12 LAB — MYOCARDIAL PERFUSION IMAGING
CHL CUP NUCLEAR SRS: 5
CHL CUP RESTING HR STRESS: 68 {beats}/min
LHR: 0.31
LV dias vol: 115 mL (ref 46–106)
LVSYSVOL: 73 mL
Peak HR: 96 {beats}/min
SDS: 1
SSS: 6
TID: 1.15

## 2016-04-12 LAB — BASIC METABOLIC PANEL
BUN: 22 mg/dL (ref 7–25)
CALCIUM: 9 mg/dL (ref 8.6–10.4)
CO2: 22 mmol/L (ref 20–31)
Chloride: 101 mmol/L (ref 98–110)
Creat: 0.91 mg/dL (ref 0.50–0.99)
GLUCOSE: 166 mg/dL — AB (ref 65–99)
POTASSIUM: 4.8 mmol/L (ref 3.5–5.3)
SODIUM: 137 mmol/L (ref 135–146)

## 2016-04-12 MED ORDER — TECHNETIUM TC 99M SESTAMIBI GENERIC - CARDIOLITE
32.4000 | Freq: Once | INTRAVENOUS | Status: AC | PRN
  Administered 2016-04-12: 32.4 via INTRAVENOUS
  Filled 2016-04-12: qty 33

## 2016-04-12 MED ORDER — TECHNETIUM TC 99M TETROFOSMIN IV KIT
11.0000 | PACK | Freq: Once | INTRAVENOUS | Status: AC | PRN
  Administered 2016-04-12: 11 via INTRAVENOUS
  Filled 2016-04-12: qty 11

## 2016-04-12 MED ORDER — REGADENOSON 0.4 MG/5ML IV SOLN
0.4000 mg | Freq: Once | INTRAVENOUS | Status: AC
  Administered 2016-04-12: 0.4 mg via INTRAVENOUS

## 2016-04-12 NOTE — Addendum Note (Signed)
Addended by: Eulis Foster on: 04/12/2016 07:34 AM   Modules accepted: Orders

## 2016-04-13 ENCOUNTER — Other Ambulatory Visit: Payer: Self-pay | Admitting: Physician Assistant

## 2016-04-13 DIAGNOSIS — R079 Chest pain, unspecified: Secondary | ICD-10-CM

## 2016-04-16 ENCOUNTER — Encounter: Payer: Self-pay | Admitting: Physician Assistant

## 2016-04-16 ENCOUNTER — Other Ambulatory Visit: Payer: No Typology Code available for payment source | Admitting: *Deleted

## 2016-04-16 ENCOUNTER — Telehealth: Payer: Self-pay | Admitting: Hematology

## 2016-04-16 DIAGNOSIS — R079 Chest pain, unspecified: Secondary | ICD-10-CM

## 2016-04-16 LAB — CBC
HCT: 38.4 % (ref 35.0–45.0)
HEMOGLOBIN: 12.9 g/dL (ref 11.7–15.5)
MCH: 30.5 pg (ref 27.0–33.0)
MCHC: 33.6 g/dL (ref 32.0–36.0)
MCV: 90.8 fL (ref 80.0–100.0)
MPV: 10 fL (ref 7.5–12.5)
PLATELETS: 217 10*3/uL (ref 140–400)
RBC: 4.23 MIL/uL (ref 3.80–5.10)
RDW: 13.7 % (ref 11.0–15.0)
WBC: 9.1 10*3/uL (ref 3.8–10.8)

## 2016-04-16 LAB — PROTIME-INR
INR: 1
PROTHROMBIN TIME: 10.7 s (ref 9.0–11.5)

## 2016-04-16 LAB — BASIC METABOLIC PANEL
BUN: 25 mg/dL (ref 7–25)
CHLORIDE: 103 mmol/L (ref 98–110)
CO2: 27 mmol/L (ref 20–31)
CREATININE: 0.85 mg/dL (ref 0.50–0.99)
Calcium: 9.2 mg/dL (ref 8.6–10.4)
GLUCOSE: 179 mg/dL — AB (ref 65–99)
POTASSIUM: 4 mmol/L (ref 3.5–5.3)
Sodium: 141 mmol/L (ref 135–146)

## 2016-04-16 NOTE — Telephone Encounter (Signed)
10/18 Appointment rescheduled to 10/20 per patient request.

## 2016-04-16 NOTE — Progress Notes (Signed)
See nuclear stress test report for information regarding referral for cath. Kylieann Eagles PA-C

## 2016-04-17 ENCOUNTER — Other Ambulatory Visit: Payer: Self-pay

## 2016-04-17 ENCOUNTER — Ambulatory Visit (HOSPITAL_COMMUNITY)
Admission: RE | Admit: 2016-04-17 | Discharge: 2016-04-18 | Disposition: A | Discharge status: Home / Self Care | Payer: No Typology Code available for payment source | Source: Ambulatory Visit | Attending: Cardiology | Admitting: Cardiology

## 2016-04-17 ENCOUNTER — Encounter (HOSPITAL_COMMUNITY): Payer: Self-pay | Admitting: Physician Assistant

## 2016-04-17 ENCOUNTER — Encounter (HOSPITAL_COMMUNITY)
Admission: RE | Disposition: A | Discharge status: Home / Self Care | Payer: Self-pay | Source: Ambulatory Visit | Attending: Cardiology

## 2016-04-17 DIAGNOSIS — G2581 Restless legs syndrome: Secondary | ICD-10-CM | POA: Diagnosis not present

## 2016-04-17 DIAGNOSIS — E785 Hyperlipidemia, unspecified: Secondary | ICD-10-CM | POA: Diagnosis present

## 2016-04-17 DIAGNOSIS — Z87891 Personal history of nicotine dependence: Secondary | ICD-10-CM | POA: Insufficient documentation

## 2016-04-17 DIAGNOSIS — F329 Major depressive disorder, single episode, unspecified: Secondary | ICD-10-CM | POA: Diagnosis not present

## 2016-04-17 DIAGNOSIS — Z6836 Body mass index (BMI) 36.0-36.9, adult: Secondary | ICD-10-CM | POA: Insufficient documentation

## 2016-04-17 DIAGNOSIS — E11319 Type 2 diabetes mellitus with unspecified diabetic retinopathy without macular edema: Secondary | ICD-10-CM | POA: Insufficient documentation

## 2016-04-17 DIAGNOSIS — Z8249 Family history of ischemic heart disease and other diseases of the circulatory system: Secondary | ICD-10-CM | POA: Diagnosis not present

## 2016-04-17 DIAGNOSIS — I252 Old myocardial infarction: Secondary | ICD-10-CM | POA: Insufficient documentation

## 2016-04-17 DIAGNOSIS — I2510 Atherosclerotic heart disease of native coronary artery without angina pectoris: Secondary | ICD-10-CM | POA: Diagnosis present

## 2016-04-17 DIAGNOSIS — Z7982 Long term (current) use of aspirin: Secondary | ICD-10-CM | POA: Insufficient documentation

## 2016-04-17 DIAGNOSIS — I25810 Atherosclerosis of coronary artery bypass graft(s) without angina pectoris: Secondary | ICD-10-CM

## 2016-04-17 DIAGNOSIS — Z955 Presence of coronary angioplasty implant and graft: Secondary | ICD-10-CM

## 2016-04-17 DIAGNOSIS — M1990 Unspecified osteoarthritis, unspecified site: Secondary | ICD-10-CM | POA: Diagnosis not present

## 2016-04-17 DIAGNOSIS — E669 Obesity, unspecified: Secondary | ICD-10-CM | POA: Diagnosis present

## 2016-04-17 DIAGNOSIS — I25119 Atherosclerotic heart disease of native coronary artery with unspecified angina pectoris: Secondary | ICD-10-CM | POA: Diagnosis not present

## 2016-04-17 DIAGNOSIS — Z951 Presence of aortocoronary bypass graft: Secondary | ICD-10-CM | POA: Diagnosis not present

## 2016-04-17 DIAGNOSIS — R9439 Abnormal result of other cardiovascular function study: Secondary | ICD-10-CM | POA: Diagnosis present

## 2016-04-17 DIAGNOSIS — I5042 Chronic combined systolic (congestive) and diastolic (congestive) heart failure: Secondary | ICD-10-CM | POA: Insufficient documentation

## 2016-04-17 DIAGNOSIS — K219 Gastro-esophageal reflux disease without esophagitis: Secondary | ICD-10-CM | POA: Insufficient documentation

## 2016-04-17 DIAGNOSIS — Z7984 Long term (current) use of oral hypoglycemic drugs: Secondary | ICD-10-CM | POA: Diagnosis not present

## 2016-04-17 DIAGNOSIS — I10 Essential (primary) hypertension: Secondary | ICD-10-CM | POA: Diagnosis present

## 2016-04-17 DIAGNOSIS — I110 Hypertensive heart disease with heart failure: Secondary | ICD-10-CM | POA: Insufficient documentation

## 2016-04-17 DIAGNOSIS — E782 Mixed hyperlipidemia: Secondary | ICD-10-CM | POA: Insufficient documentation

## 2016-04-17 DIAGNOSIS — I209 Angina pectoris, unspecified: Secondary | ICD-10-CM | POA: Diagnosis present

## 2016-04-17 DIAGNOSIS — Z23 Encounter for immunization: Secondary | ICD-10-CM | POA: Diagnosis not present

## 2016-04-17 DIAGNOSIS — E119 Type 2 diabetes mellitus without complications: Secondary | ICD-10-CM

## 2016-04-17 HISTORY — PX: CARDIAC CATHETERIZATION: SHX172

## 2016-04-17 HISTORY — DX: Pneumonia, unspecified organism: J18.9

## 2016-04-17 HISTORY — PX: CORONARY ANGIOPLASTY WITH STENT PLACEMENT: SHX49

## 2016-04-17 HISTORY — DX: Obstructive sleep apnea (adult) (pediatric): G47.33

## 2016-04-17 HISTORY — DX: Dependence on other enabling machines and devices: Z99.89

## 2016-04-17 HISTORY — DX: Hyperlipidemia, unspecified: E78.5

## 2016-04-17 HISTORY — DX: Anxiety disorder, unspecified: F41.9

## 2016-04-17 HISTORY — DX: Disorder of thyroid, unspecified: E07.9

## 2016-04-17 LAB — GLUCOSE, CAPILLARY
GLUCOSE-CAPILLARY: 177 mg/dL — AB (ref 65–99)
Glucose-Capillary: 147 mg/dL — ABNORMAL HIGH (ref 65–99)
Glucose-Capillary: 131 mg/dL — ABNORMAL HIGH (ref 65–99)
Glucose-Capillary: 209 mg/dL — ABNORMAL HIGH (ref 65–99)

## 2016-04-17 LAB — POCT ACTIVATED CLOTTING TIME: ACTIVATED CLOTTING TIME: 246 s

## 2016-04-17 SURGERY — LEFT HEART CATH AND CORS/GRAFTS ANGIOGRAPHY

## 2016-04-17 MED ORDER — LIDOCAINE HCL (PF) 1 % IJ SOLN
INTRAMUSCULAR | Status: AC
  Filled 2016-04-17: qty 30

## 2016-04-17 MED ORDER — FENTANYL CITRATE (PF) 100 MCG/2ML IJ SOLN
INTRAMUSCULAR | Status: AC
  Filled 2016-04-17: qty 2

## 2016-04-17 MED ORDER — MIDAZOLAM HCL 2 MG/2ML IJ SOLN
INTRAMUSCULAR | Status: AC
  Filled 2016-04-17: qty 2

## 2016-04-17 MED ORDER — PANTOPRAZOLE SODIUM 40 MG PO TBEC: 40.0000 mg | DELAYED_RELEASE_TABLET | Freq: Every day | ORAL | 1 refills | Status: DC

## 2016-04-17 MED ORDER — BIVALIRUDIN 250 MG IV SOLR
INTRAVENOUS | Status: AC
  Filled 2016-04-17: qty 250

## 2016-04-17 MED ORDER — CLOPIDOGREL BISULFATE 75 MG PO TABS: 75.0000 mg | ORAL_TABLET | Freq: Every day | ORAL | 0 refills | Status: DC

## 2016-04-17 MED ORDER — METOPROLOL TARTRATE 25 MG PO TABS
25.0000 mg | ORAL_TABLET | Freq: Two times a day (BID) | ORAL | Status: DC
  Administered 2016-04-17 – 2016-04-18 (×2): 25 mg via ORAL
  Filled 2016-04-17 (×2): qty 1

## 2016-04-17 MED ORDER — SODIUM CHLORIDE 0.9 % IV SOLN
INTRAVENOUS | Status: DC
  Administered 2016-04-17: 07:00:00 via INTRAVENOUS

## 2016-04-17 MED ORDER — HEPARIN (PORCINE) IN NACL 2-0.9 UNIT/ML-% IJ SOLN
INTRAMUSCULAR | Status: DC | PRN
  Administered 2016-04-17: 1500 mL

## 2016-04-17 MED ORDER — IOPAMIDOL (ISOVUE-370) INJECTION 76%
INTRAVENOUS | Status: AC
  Filled 2016-04-17: qty 125

## 2016-04-17 MED ORDER — ISOSORBIDE MONONITRATE ER 60 MG PO TB24
60.0000 mg | ORAL_TABLET | Freq: Every day | ORAL | Status: DC
  Administered 2016-04-18: 60 mg via ORAL
  Filled 2016-04-17: qty 1

## 2016-04-17 MED ORDER — PANTOPRAZOLE SODIUM 40 MG PO TBEC
40.0000 mg | DELAYED_RELEASE_TABLET | Freq: Every day | ORAL | Status: DC
  Administered 2016-04-17 – 2016-04-18 (×2): 40 mg via ORAL
  Filled 2016-04-17 (×2): qty 1

## 2016-04-17 MED ORDER — FERROUS SULFATE 325 (65 FE) MG PO TABS
325.0000 mg | ORAL_TABLET | Freq: Every day | ORAL | Status: DC
  Administered 2016-04-18: 325 mg via ORAL
  Filled 2016-04-17: qty 1

## 2016-04-17 MED ORDER — ACETAMINOPHEN 500 MG PO TABS: 1000.0000 mg | ORAL_TABLET | Freq: Four times a day (QID) | ORAL | Status: AC | PRN

## 2016-04-17 MED ORDER — CLOPIDOGREL BISULFATE 75 MG PO TABS: 75.0000 mg | ORAL_TABLET | Freq: Every day | ORAL | 11 refills | Status: DC

## 2016-04-17 MED ORDER — ENSURE ENLIVE PO LIQD
237.0000 mL | Freq: Two times a day (BID) | ORAL | Status: DC
  Filled 2016-04-17 (×4): qty 237

## 2016-04-17 MED ORDER — INFLUENZA VAC SPLIT QUAD 0.5 ML IM SUSY
0.5000 mL | PREFILLED_SYRINGE | INTRAMUSCULAR | Status: AC
  Administered 2016-04-18: 11:00:00 0.5 mL via INTRAMUSCULAR
  Filled 2016-04-17: qty 0.5

## 2016-04-17 MED ORDER — SODIUM CHLORIDE 0.9 % WEIGHT BASED INFUSION: 3.0000 mL/kg/h | INTRAVENOUS | Status: DC

## 2016-04-17 MED ORDER — NITROGLYCERIN 1 MG/10 ML FOR IR/CATH LAB
INTRA_ARTERIAL | Status: DC | PRN
  Administered 2016-04-17 (×2): 200 ug via INTRACORONARY

## 2016-04-17 MED ORDER — LISINOPRIL 5 MG PO TABS
5.0000 mg | ORAL_TABLET | Freq: Every day | ORAL | Status: DC
  Administered 2016-04-18: 5 mg via ORAL
  Filled 2016-04-17: qty 1

## 2016-04-17 MED ORDER — HEPARIN (PORCINE) IN NACL 2-0.9 UNIT/ML-% IJ SOLN
INTRAMUSCULAR | Status: AC
  Filled 2016-04-17: qty 1000

## 2016-04-17 MED ORDER — ASPIRIN EC 81 MG PO TBEC: 81.0000 mg | DELAYED_RELEASE_TABLET | Freq: Every day | ORAL | 11 refills | Status: DC

## 2016-04-17 MED ORDER — ANGIOPLASTY BOOK
Freq: Once | Status: AC
  Administered 2016-04-17: 17:00:00
  Filled 2016-04-17: qty 1

## 2016-04-17 MED ORDER — HYDROCHLOROTHIAZIDE 25 MG PO TABS: 25.0000 mg | ORAL_TABLET | Freq: Every day | ORAL | Status: DC

## 2016-04-17 MED ORDER — ASPIRIN 81 MG PO CHEW
81.0000 mg | CHEWABLE_TABLET | ORAL | Status: AC
  Administered 2016-04-17: 81 mg via ORAL

## 2016-04-17 MED ORDER — ASPIRIN 81 MG PO CHEW
CHEWABLE_TABLET | ORAL | Status: AC
  Filled 2016-04-17: qty 1

## 2016-04-17 MED ORDER — IOPAMIDOL (ISOVUE-370) INJECTION 76%
INTRAVENOUS | Status: AC
  Filled 2016-04-17: qty 100

## 2016-04-17 MED ORDER — ASPIRIN EC 81 MG PO TBEC: 81.0000 mg | DELAYED_RELEASE_TABLET | Freq: Every day | ORAL | 0 refills | Status: DC

## 2016-04-17 MED ORDER — FENTANYL CITRATE (PF) 100 MCG/2ML IJ SOLN
INTRAMUSCULAR | Status: DC | PRN
  Administered 2016-04-17: 25 ug via INTRAVENOUS

## 2016-04-17 MED ORDER — ASPIRIN EC 81 MG PO TBEC
81.0000 mg | DELAYED_RELEASE_TABLET | Freq: Every day | ORAL | Status: DC
Start: 2016-04-17 — End: 2016-04-17

## 2016-04-17 MED ORDER — SODIUM CHLORIDE 0.9% FLUSH: 3.0000 mL | INTRAVENOUS | Status: DC | PRN

## 2016-04-17 MED ORDER — IOPAMIDOL (ISOVUE-370) INJECTION 76%
INTRAVENOUS | Status: DC | PRN
  Administered 2016-04-17: 155 mL via INTRA_ARTERIAL

## 2016-04-17 MED ORDER — CLOPIDOGREL BISULFATE 300 MG PO TABS
ORAL_TABLET | ORAL | Status: AC
  Filled 2016-04-17: qty 2

## 2016-04-17 MED ORDER — ATORVASTATIN CALCIUM 80 MG PO TABS
80.0000 mg | ORAL_TABLET | Freq: Every evening | ORAL | Status: DC
  Filled 2016-04-17: qty 1

## 2016-04-17 MED ORDER — CLOPIDOGREL BISULFATE 300 MG PO TABS
ORAL_TABLET | ORAL | Status: DC | PRN
  Administered 2016-04-17: 600 mg via ORAL

## 2016-04-17 MED ORDER — BIVALIRUDIN BOLUS VIA INFUSION - CUPID
INTRAVENOUS | Status: DC | PRN
  Administered 2016-04-17: 35.025 mg via INTRAVENOUS

## 2016-04-17 MED ORDER — SODIUM CHLORIDE 0.9% FLUSH: 3.0000 mL | Freq: Two times a day (BID) | INTRAVENOUS | Status: DC

## 2016-04-17 MED ORDER — ONDANSETRON HCL 4 MG/2ML IJ SOLN: 4.0000 mg | Freq: Four times a day (QID) | INTRAMUSCULAR | Status: DC | PRN

## 2016-04-17 MED ORDER — NITROGLYCERIN 1 MG/10 ML FOR IR/CATH LAB
INTRA_ARTERIAL | Status: AC
  Filled 2016-04-17: qty 10

## 2016-04-17 MED ORDER — ESCITALOPRAM OXALATE 10 MG PO TABS
10.0000 mg | ORAL_TABLET | Freq: Every day | ORAL | Status: DC
  Administered 2016-04-18: 10 mg via ORAL
  Filled 2016-04-17: qty 1

## 2016-04-17 MED ORDER — SODIUM CHLORIDE 0.9 % IV SOLN
INTRAVENOUS | Status: DC | PRN
  Administered 2016-04-17: 1.75 mg/kg/h via INTRAVENOUS

## 2016-04-17 MED ORDER — ACETAMINOPHEN 325 MG PO TABS
650.0000 mg | ORAL_TABLET | ORAL | Status: DC | PRN
  Administered 2016-04-17: 650 mg via ORAL

## 2016-04-17 MED ORDER — HYDROCHLOROTHIAZIDE 25 MG PO TABS
25.0000 mg | ORAL_TABLET | Freq: Every day | ORAL | Status: DC
  Administered 2016-04-18: 25 mg via ORAL
  Filled 2016-04-17: qty 1

## 2016-04-17 MED ORDER — ACETAMINOPHEN 325 MG PO TABS
ORAL_TABLET | ORAL | Status: AC
  Filled 2016-04-17: qty 2

## 2016-04-17 MED ORDER — LIDOCAINE HCL (PF) 1 % IJ SOLN
INTRAMUSCULAR | Status: DC | PRN
  Administered 2016-04-17: 20 mL via SUBCUTANEOUS

## 2016-04-17 MED ORDER — CLOPIDOGREL BISULFATE 75 MG PO TABS
75.0000 mg | ORAL_TABLET | Freq: Every day | ORAL | Status: DC
  Administered 2016-04-18: 11:00:00 75 mg via ORAL
  Filled 2016-04-17: qty 1

## 2016-04-17 MED ORDER — SODIUM CHLORIDE 0.9 % IV SOLN: 250.0000 mL | INTRAVENOUS | Status: DC | PRN

## 2016-04-17 MED ORDER — MIDAZOLAM HCL 2 MG/2ML IJ SOLN
INTRAMUSCULAR | Status: DC | PRN
  Administered 2016-04-17: 2 mg via INTRAVENOUS

## 2016-04-17 MED ORDER — ASPIRIN EC 81 MG PO TBEC
81.0000 mg | DELAYED_RELEASE_TABLET | Freq: Every day | ORAL | Status: DC
  Administered 2016-04-18: 81 mg via ORAL
  Filled 2016-04-17: qty 1

## 2016-04-17 MED FILL — CLOPIDOGREL 75 MG TABLET: 75 | 30 days supply | Qty: 30 | Fill #0

## 2016-04-17 MED FILL — ASPIR-LOW EC 81 MG TABLET: 81 | 30 days supply | Qty: 30 | Fill #0

## 2016-04-17 SURGICAL SUPPLY — 18 items
BALLN EUPHORA RX 2.0X12 (BALLOONS) ×3
BALLN ~~LOC~~ EUPHORA RX 2.75X8 (BALLOONS) ×3
BALLOON EUPHORA RX 2.0X12 (BALLOONS) IMPLANT
BALLOON ~~LOC~~ EUPHORA RX 2.75X8 (BALLOONS) IMPLANT
CATH INFINITI 5FR MULTPACK ANG (CATHETERS) ×2 IMPLANT
CATH VISTA GUIDE 6FR IM 90 CM (CATHETERS) ×2 IMPLANT
DEVICE WIRE ANGIOSEAL 6FR (Vascular Products) ×2 IMPLANT
KIT ENCORE 26 ADVANTAGE (KITS) ×2 IMPLANT
KIT HEART LEFT (KITS) ×3 IMPLANT
PACK CARDIAC CATHETERIZATION (CUSTOM PROCEDURE TRAY) ×3 IMPLANT
SHEATH PINNACLE 5F 10CM (SHEATH) ×2 IMPLANT
SHEATH PINNACLE 6F 10CM (SHEATH) ×2 IMPLANT
STENT PROMUS PREM MR 2.5X12 (Permanent Stent) ×2 IMPLANT
SYR MEDRAD MARK V 150ML (SYRINGE) ×3 IMPLANT
TRANSDUCER W/STOPCOCK (MISCELLANEOUS) ×3 IMPLANT
TUBING CIL FLEX 10 FLL-RA (TUBING) ×3 IMPLANT
WIRE ASAHI PROWATER 180CM (WIRE) ×2 IMPLANT
WIRE EMERALD ST .035X150CM (WIRE) ×2 IMPLANT

## 2016-04-17 NOTE — Progress Notes (Signed)
Report  Called to [facility] and transferred via bed with cardiac monitor

## 2016-04-17 NOTE — Progress Notes (Addendum)
Patient was initially planned for same day PCI discharge. This evening BP is somewhat elevated - she did not take AM BP medicines so we gave her Imdur, lisinopril, and metoprolol down in short stay. Right groin cath site with minimal oozing of blood upon blotting of gauze. She does not meet criteria to leave given her groin oozing - will admit overnight for observation, anticipate DC in AM if no further complications. Groin dressing replaced by nursing. Patient in agreement. I also asked nurse to catch her up on her Lexapro and Protonix as well.  I signed out to Reino Bellis PA-C to f/u and assist with finalization of DC in AM as I am off tomorrow. She already had her 30-day aspirin and Plavix bottles delivered to bedside from [facility] this afternoon.  I also already sent her aspirin/Plavix refills and new Protonix rx to [facility] per her request. (I initially sent these in to [facility] but the patient verbalized preference to [facility] so I re-routed RXs and called [facility] pharmacy to cancel the additional prescriptions.) If she stays beyond tomorrow morning will need additional DVT ppx prescribed but hopefully home in AM.  Melina Copa PA-C

## 2016-04-17 NOTE — Discharge Summary (Signed)
Discharge Summary    Patient ID: [name],  MRN: [id number], DOB/AGE: 12/14/1950 65 y.o.  Admit date: 04/17/2016 Discharge date: Initially was planned for same day but subsequently observed overnight  Primary Care Provider: [facility] Primary Cardiologist: Dr. Radford Pax   Discharge Diagnoses    Principal Problem:   Abnormal nuclear stress test Active Problems:   CAD (coronary artery disease)   Angina pectoris ([place])   Hyperlipidemia   Diabetes mellitus type 2, noninsulin dependent ([place])   Hypertension   Obesity (BMI 30-39.9)    Diagnostic Studies/Procedures    1. Cardiac catheterization this admission, please see full report and below for summary. _____________   History of Present Illness & Hospital Course    [name] is a 65F with CAD (LAD stent 2007, CABG in 07/2006, Endeavor stent to diagonal 12/2006, cath 2010 with small vessel dz, stable cath 07/2013), depression, GERD, Graves disease, HTN, migraines, anemia, arthritis, obesity, retinopathy, DM, RLS, chronic LE (at end of the day), chronic combined CHF who presented to [facility] for planned cath. Last echo 12/2014 showed EF 40-45%, grade 1 DD, mild LAE. She has been on medical therapy but recently ran out of her meds for the past 4 months - needed new rx's and owed a bill to PCP and could not be seen until she paid it. Saw Dr. Radford Pax 03/19/16 at which time she was reporting some chest heaviness, different than prior angina, nonexertional. It was felt possibly related to her uncontrolled BP. She was also reporting chronic dependent LEE at the end of the day and chronic dizziness with balance. Beta blocker, diuretic, statin, metformin were restarted and Lovaza was added. Labs 03/28/16: BUN 18, Cr 0.9, glu 184, K 3.9, A1C 10, trig 308, HDL 34, LDL 57, alk phos 135, Tprot 5.9 otherwise LFTs OK. She returned for follow-up 04/09/16 to determine if further ischemic workup was needed. She was still reporting CP with mixed  typical/atypical features. Nuclear stress test 04/12/16 was intermediate risk with prominent apical thinning vs small prior infarct; very mild apical ischemia; EF 36% with global hypokinesis and mild LVE. The patient was referred for cardiac cath which was performed today showing new ostial stenosis at the origin of the LIMA-LAD, known chronic occlusion of SVG-diagonal, EF 45%. She received a 2.5X12 Promus drug eluting stent to the LIMA-LAD. DAPT x 1 year was recommended. She was initially considered a good candidate for same day PCI discharge. 1 month supply of ASA and Plavix were delivered to bedside and other refills sent in to usual pharmacy. Her Prilosec was changed to Protonix and she was instructed to hold her Metformin for 48 hours after cath. The only other med modification made was that she reported taking Tylenol 1,'500mg'$  as needed - was instructed that max dose is 1,'000mg'$  per dose every 6 hours. She has f/u already scheduled. She was referred to OP cardiac rehab. Standard post cath precautions given. BP was mildly elevated post-cath but she reported she had not taken her usual BP medications yet. We administered metoprolol, Imdur, and lisinopril and followed BP to trend in the right direction prior to DC. She was initially going to be discharged by same day PCI protocol but had some groin oozing so was observed overnight.    Consultants: N/A _____________  Discharge Vitals Blood pressure (!) 147/63, pulse 71, temperature 97.8 F (36.6 C), temperature source Oral, resp. rate (!) 21, height 4' 11.5" (1.511 m), weight 103 lb (46.7 kg), SpO2 98 %.  Filed  Weights   04/17/16 0551  Weight: 103 lb (46.7 kg)    Labs & Radiologic Studies    CBC  Recent Labs  04/16/16 0908  WBC 9.1  HGB 12.9  HCT 38.4  MCV 90.8  PLT 989   Basic Metabolic Panel  Recent Labs  04/16/16 0909  NA 141  K 4.0  CL 103  CO2 27  GLUCOSE 179*  BUN 25  CREATININE 0.85  CALCIUM 9.2  _____________  No  results found. Disposition   Pt is being discharged home in good condition.  Follow-up Plans & Appointments    Follow-up Information    [name], PA-C .   Specialties:  Cardiology, Radiology Why:  Keep follow-up as scheduled on 04/24/16 at 9:30am. Please arrive 10-15 minutes early to check in. Contact information: [address] [place] [address] [phone]          Discharge Instructions    Amb Referral to Cardiac Rehabilitation    Complete by:  As directed    Diagnosis:   Coronary Stents PTCA     Diet - low sodium heart healthy    Complete by:  As directed    Increase activity slowly    Complete by:  As directed    The maximum dose of Tylenol (acetaminophen) per dose is 1,'000mg'$ .  Some studies suggest Prilosec/Omeprazole interacts with Plavix. We changed your Prilosec/Omeprazole to Protonix for less chance of interaction.   Please do not take your metformin for 48 hours after your heart cath. You may restart it the evening of 04/19/16.  No driving for 3 days. No lifting over 5 lbs for 1 week. No sexual activity for 1 week. Keep procedure site clean & dry. If you notice increased pain, swelling, bleeding or pus, call/return!  You may shower, but no soaking baths/hot tubs/pools for 1 week.      Discharge Medications     Medication List    STOP taking these medications   omeprazole 40 MG capsule Commonly known as:  PRILOSEC     TAKE these medications   acetaminophen 500 MG tablet Commonly known as:  TYLENOL Take 2 tablets (1,000 mg total) by mouth every 6 (six) hours as needed for moderate pain or headache. What changed:  how much to take   aspirin EC 81 MG tablet Take 1 tablet (81 mg total) by mouth daily.   atorvastatin 80 MG tablet Commonly known as:  LIPITOR Take 1 tablet (80 mg total) by mouth every evening.   clopidogrel 75 MG tablet Commonly known as:  PLAVIX Take 1 tablet (75 mg total) by mouth daily.     escitalopram 10 MG tablet Commonly known as:  LEXAPRO Take 1 tablet (10 mg total) by mouth daily.   ferrous sulfate 325 (65 FE) MG EC tablet Take 1 tablet (325 mg total) by mouth daily with breakfast.   hydrochlorothiazide 25 MG tablet Commonly known as:  HYDRODIURIL Take 1 tablet (25 mg total) by mouth daily.   isosorbide mononitrate 60 MG 24 hr tablet Commonly known as:  IMDUR Take 1 tablet (60 mg total) by mouth daily.   lisinopril 5 MG tablet Commonly known as:  PRINIVIL,ZESTRIL Take 1 tablet (5 mg total) by mouth daily.   metFORMIN 1000 MG tablet Commonly known as:  GLUCOPHAGE Take 1 tablet (1,000 mg total) by mouth 2 (two) times daily with a meal. Notes to patient:  IMPORTANT!!!!!!!!!!!!!!!!!!!!!!!!!!!!  !!!!!!!!!!!!!!!!!!!!!!!!!!!!!!!!!!!!!!!!!!!!! Do not restart until the evening of 04/19/16.  metoprolol tartrate 25 MG tablet Commonly known as:  LOPRESSOR Take 1 tablet (25 mg total) by mouth 2 (two) times daily.   nitroGLYCERIN 0.4 MG SL tablet Commonly known as:  NITROSTAT Place 1 tablet (0.4 mg total) under the tongue every 5 (five) minutes as needed for chest pain.   pantoprazole 40 MG tablet Commonly known as:  PROTONIX Take 1 tablet (40 mg total) by mouth daily.        Allergies:  No Known Allergies   Outstanding Labs/Studies   NA  Duration of Discharge Encounter   Greater than 30 minutes including physician time.  Signed, Nedra Hai Dunn PA-C 04/17/2016, 2:34 PM   I followed up with Mrs. Doom on 10/18. Her right groin cath site was stable with further bleeding, minimal bruising and no hematoma. She reported feeling well post intervention. She her bottles of ASA and Plavix and knows to pick up her Rx once these bottles are completed. Her blood pressure has remained slightly elevated, I have encouraged her to continue to monitor in the outpatient setting.   Physical Exam:  GEN: Well nourished, well developed obese WF, in no acute distress   HEENT: normocephalic, atraumatic Neck: no JVD, carotid bruits, or masses Cardiac: RRR; no murmurs, rubs, or gallops, no edema  Respiratory:  clear to auscultation bilaterally, normal work of breathing GI: soft, nontender, nondistended, + BS MS: no deformity or atrophy  Skin: warm and dry, no rash Neuro:  Alert and Oriented x 3, Strength and sensation are intact, follows commands Psych: euthymic mood, full affect  Reino Bellis NP-C  I have examined the patient and reviewed assessment and plan and discussed with patient.  Agree with above as stated.  No right groin hematoma.  I stressed the importance of DAPT.  Plan discharge today with aggressive secondary prevention.  Larae Grooms

## 2016-04-17 NOTE — H&P (View-Only) (Signed)
Cardiology Office Note    Date:  04/09/2016  ID:  [name], DOB 10-Jun-1951, MRN [id number] PCP:  Reginia Naas, MD  Cardiologist:  Radford Pax   Chief Complaint: f/u chest pain  History of Present Illness:  [name] is a 65 y.o. female with history of CAD (LAD stent 2007, CABG in 07/2006, Endeavor stent to diagonal 12/2006, cath 2010 with small vessel dz, stable cath 07/2013), depression, GERD, Graves disease, HTN, migraines, anemia, arthritis, obesity, retinopathy, DM, RLS, chronic LE (at end of the day), chronic combined CHF per echo 2016 who presents for f/u of chest pain. Cardiac cath 07/2013 revealed single vessel ASCAD involving the mid LAD with a 90% mid LAD with the distal vessel filling competitvely by the IMA graft, patent LIMA to the LAD and chronically occluded SVG to the diagonal. Last echo 12/2014 showed EF 40-45%, grade 1 DD, mild LAE. She has been on medical therapy but recently ran out of her meds for the past 4 months - needed new rx's and owed a bill to PCP and could not be seen until she paid it. Saw Dr. Radford Pax 03/19/16 at which time she was reporting some chest heaviness, different than prior angina, nonexertional. It was felt possibly related to her uncontrolled BP. She was also reporting chronic dependent LEE at the end of the day and chronic dizziness with balance. Beta blocker, diuretic, statin, metformin were restarted and Lovaza was added. Labs 03/28/16: BUN 18, Cr 0.9, glu 184, K 3.9, A1C 10, trig 308, HDL 34, LDL 57, alk phos 135, Tprot 5.9 otherwise LFTs OK. Saw HTN clinic on 03/28/16 at which time BP improved to 138/82 (prev 180/120).  She returns to clinic for follow-up today. BP 144/72. She still has occasional episodes of chest pressure that occur out of the blue, sometimes 1-2x a day, feeling like something sitting on her chest and in the top of her back. There is no rhyme or reason to the episodes. It can happen both at rest and with exertion but does not  happen every time she exerts herself. It is not positional or pleuritic.She walked all the way from parking lot to clinic today without any symptoms at all. The back discomfort feels possibly like prior angina except in the past this happened with exertion and she also had associated SOB which is not the case now. This time around she denies any SOB or diaphoresis. CP resolves after 20 minutes of lying down. Her NTG is out of date. No recent syncope. No LEE noted on exam today.    Past Medical History:  Diagnosis Date  . Anemia   . Arthritis    "left hip"  (07/15/2013)  . CAD (coronary artery disease)    a. s/p 2007  LAD w/ 3.5 x 16 mm Liberte stent b. s/p 07/2006 CABG with IMG to LAD and SVG to Diag. c. 12/2006 diagonal branch w/ 2.5 x 12 mm Endeavor stent. d. s/p 2010 cath with small vessel disease treated with RX  e. 07/2013 s/p cath showing stable dx from 2010 with no interventions   . Chronic combined systolic and diastolic CHF (congestive heart failure) ([place])   . Depression   . GERD (gastroesophageal reflux disease)   . Grave's disease   . [id number](478.2)    "maybe once/month" (07/15/2013)  . High cholesterol   . Hypertension   . Migraines    "couple times/yr"  (07/15/2013)  . Myocardial infarction ?2008  . Obesity   . Osteopenia  12/2010 &01/21/2013   Minimal of hip DEXA   . Retinopathy 12/08   right  . RLS (restless legs syndrome)   . Type II diabetes mellitus ([place])     Past Surgical History:  Procedure Laterality Date  . CARDIAC CATHETERIZATION  02/03/2008   for CP patent LIMA w mid-vessel spasm.  . COLONOSCOPY  10/2010   rectal polyp  . CORONARY ANGIOPLASTY WITH STENT PLACEMENT     "I've had quite a few stents"   . CORONARY ARTERY BYPASS GRAFT  ?2008   s/p two vessel CABG off pump LAD diagonal and PTCA/DE Stent mid LAD/ DE stent LAD diagonal through previous stent sstruts,occluded SVG-diagonal  . LAPAROSCOPIC CHOLECYSTECTOMY  1990's  . LEFT HEART CATHETERIZATION WITH CORONARY  ANGIOGRAM N/A 07/15/2013   Procedure: LEFT HEART CATHETERIZATION WITH CORONARY ANGIOGRAM;  Surgeon: Peter M Martinique, MD;  Location[facility] C[facility] LAB;  Service: Cardiovascular;  Laterality: N/A;  . TUBAL LIGATION  1976    Current Medications: Current Outpatient Prescriptions  Medication Sig Dispense Refill  . aspirin EC 81 MG tablet Take 1 tablet (81 mg total) by mouth daily.    Marland Kitchen atorvastatin (LIPITOR) 80 MG tablet Take 1 tablet (80 mg total) by mouth every evening. 90 tablet 3  . escitalopram (LEXAPRO) 10 MG tablet Take 1 tablet (10 mg total) by mouth daily. 90 tablet 3  . ferrous sulfate 325 (65 FE) MG EC tablet Take 1 tablet (325 mg total) by mouth daily with breakfast. 90 tablet 3  . hydrochlorothiazide (HYDRODIURIL) 25 MG tablet Take 1 tablet (25 mg total) by mouth daily. 90 tablet 3  . isosorbide mononitrate (IMDUR) 60 MG 24 hr tablet Take 1 tablet (60 mg total) by mouth daily. 90 tablet 3  . metFORMIN (GLUCOPHAGE) 1000 MG tablet Take 1 tablet (1,000 mg total) by mouth 2 (two) times daily with a meal. 90 tablet 3  . metoprolol tartrate (LOPRESSOR) 25 MG tablet Take 1 tablet (25 mg total) by mouth 2 (two) times daily. 180 tablet 3  . nitroGLYCERIN (NITROSTAT) 0.4 MG SL tablet Place 0.4 mg under the tongue every 5 (five) minutes as needed for chest pain.    Marland Kitchen omeprazole (PRILOSEC) 40 MG capsule Take 1 capsule (40 mg total) by mouth daily. 90 capsule 3   No current facility-administered medications for this visit.      Allergies:   Review of patient's allergies indicates no known allergies.   Social History   Social History  . Marital status: Married    Spouse name: N/A  . Number of children: N/A  . Years of education: N/A   Social History Main Topics  . Smoking status: Former Smoker    Quit date: 07/02/1989  . Smokeless tobacco: Never Used  . Alcohol use No  . Drug use: No  . Sexual activity: Yes   Other Topics Concern  . None   Social History Narrative  . None     Family  History:  The patient's family history includes Alzheimer's disease in her maternal grandmother; Cancer in her father and mother; Diabetes in her maternal grandmother and mother; Hypertension in her mother.   ROS:   Please see the history of present illness. All other systems are reviewed and otherwise negative.    PHYSICAL EXAM:   VS:  BP (!) 144/70   Pulse 74   Ht _0  (1.499 m)   Wt 183 lb 6.4 oz (83.2 kg)   SpO2 97%   BMI 37.04 kg/m  BMI: Body mass index is 37.04 kg/m. GEN: Well nourished, well developed obese WF, in no acute distress  HEENT: normocephalic, atraumatic Neck: no JVD, carotid bruits, or masses Cardiac: RRR; no murmurs, rubs, or gallops, no edema  Respiratory:  clear to auscultation bilaterally, normal work of breathing GI: soft, nontender, nondistended, + BS MS: no deformity or atrophy  Skin: warm and dry, no rash Neuro:  Alert and Oriented x 3, Strength and sensation are intact, follows commands Psych: euthymic mood, full affect  Wt Readings from Last 3 Encounters:  04/09/16 183 lb 6.4 oz (83.2 kg)  03/28/16 181 lb 8 oz (82.3 kg)  03/19/16 184 lb 12.8 oz (83.8 kg)      Studies/Labs Reviewed:   EKG:  EKG was ordered today and personally reviewed by me and demonstrates NSR 71bpm, nonspecific T wave changes  Recent Labs: 10/04/2015: HGB 12.9; Platelets 209 03/28/2016: ALT 12; BUN 18; Creat 0.90; Potassium 3.9; Sodium 137   Lipid Panel    Component Value Date/Time   CHOL 153 03/28/2016 0758   TRIG 308 (H) 03/28/2016 0758   HDL 34 (L) 03/28/2016 0758   CHOLHDL 4.5 03/28/2016 0758   VLDL 62 (H) 03/28/2016 0758   LDLCALC 57 03/28/2016 0758    Additional studies/ records that were reviewed today include: Summarized above.    ASSESSMENT & PLAN:   1. Chest pressure - mixed typical/atypical symptoms. Discussed stress test vs cath with patient. At this time will proceed with Lexiscan nuclear stress test to evaluate for ischemia. If abnormal will need  definitive heart cath. Will also titrate BP medication today. Continue PPI. Will rx SL NTG PRN for patient to try. Warning sx reviewed with patient. 2. CAD s/p CABG, PCI - obtaining Lexiscan as above. Continue ASA, BB, statin. 3. Essential HTN - see above, adding ACEI. Repeat BMET 1 week after initiation. 4. Chronic combined CHF - appears euvolemic on exam. Add ACEI as above, not only for h/o LV dysfunction, but also for DM and HTN. Cost has been an issue for her in the past so I've chosen not to repeat an echocardiogram at this time - will await EF assessment by nuclear stress test. 5. Mixed hyperlipidemia - if tolerating statin at time of f/u would consider repeating lipids/LFTs at that time.   Disposition: F/u with me or Dr. Radford Pax after stress test.  Medication Adjustments/Labs and Tests Ordered: Current medicines are reviewed at length with the patient today.  Concerns regarding medicines are outlined above. Medication changes, Labs and Tests ordered today are summarized above and listed in the Patient Instructions accessible in Encounters.   Raechel Ache PA-C  04/09/2016 9:39 AM    [address], [place], [place]  [address] Phone: [phone]; Fax: [phone]

## 2016-04-17 NOTE — Progress Notes (Signed)
CARDIAC REHAB PHASE I   PRE:  Rate/Rhythm: 74 SR    BP: sitting 137/64    SaO2: 100 RA  MODE:  Ambulation: 200 ft   POST:  Rate/Rhythm: 91 SR    BP: sitting 169/85     SaO2: 100 RA  Pt eager to walk. Steady, no c/o. Groin stable (RN checked after walk). Pt denies CP. Ed completed with pt and husband. Voiced understanding. Understands importance of Plavix/asa and other meds. Requests her referral be sent to [facility] (she has done program before).   [place], ACSM 04/17/2016 1:57 PM

## 2016-04-17 NOTE — Interval H&P Note (Signed)
History and Physical Interval Note:  04/17/2016 7:30 AM  [name]  has presented today for surgery, with the diagnosis of abnormal stress test - cp  The various methods of treatment have been discussed with the patient and family. After consideration of risks, benefits and other options for treatment, the patient has consented to  Procedure(s): Left Heart Cath and Cors/Grafts Angiography (N/A) as a surgical intervention .  The patient's history has been reviewed, patient examined, no change in status, stable for surgery.  I have reviewed the patient's chart and labs.  Questions were answered to the patient's satisfaction.    Cath Lab Visit (complete for each Cath Lab visit)  Clinical Evaluation Leading to the Procedure:   ACS: No.  Non-ACS:    Anginal Classification: CCS II  Anti-ischemic medical therapy: Maximal Therapy (2 or more classes of medications)  Non-Invasive Test Results: Intermediate-risk stress test findings: cardiac mortality 1-3%/year  Prior CABG: Previous CABG       [name] 04/17/2016 7:30 AM

## 2016-04-17 NOTE — Progress Notes (Signed)
Up to bedside commode and tolerated well

## 2016-04-18 ENCOUNTER — Other Ambulatory Visit: Payer: Medicare Other

## 2016-04-18 ENCOUNTER — Encounter (HOSPITAL_COMMUNITY): Payer: Self-pay | Admitting: Cardiology

## 2016-04-18 DIAGNOSIS — I2510 Atherosclerotic heart disease of native coronary artery without angina pectoris: Secondary | ICD-10-CM

## 2016-04-18 DIAGNOSIS — E669 Obesity, unspecified: Secondary | ICD-10-CM

## 2016-04-18 DIAGNOSIS — I209 Angina pectoris, unspecified: Secondary | ICD-10-CM

## 2016-04-18 DIAGNOSIS — E782 Mixed hyperlipidemia: Secondary | ICD-10-CM

## 2016-04-18 DIAGNOSIS — E119 Type 2 diabetes mellitus without complications: Secondary | ICD-10-CM

## 2016-04-18 DIAGNOSIS — Z955 Presence of coronary angioplasty implant and graft: Secondary | ICD-10-CM | POA: Diagnosis not present

## 2016-04-18 DIAGNOSIS — R9439 Abnormal result of other cardiovascular function study: Secondary | ICD-10-CM | POA: Diagnosis not present

## 2016-04-18 DIAGNOSIS — I25119 Atherosclerotic heart disease of native coronary artery with unspecified angina pectoris: Secondary | ICD-10-CM | POA: Diagnosis not present

## 2016-04-18 DIAGNOSIS — I2583 Coronary atherosclerosis due to lipid rich plaque: Secondary | ICD-10-CM

## 2016-04-18 LAB — CBC
HEMATOCRIT: 32.9 % — AB (ref 36.0–46.0)
HEMOGLOBIN: 11.3 g/dL — AB (ref 12.0–15.0)
MCH: 30.1 pg (ref 26.0–34.0)
MCHC: 34.3 g/dL (ref 30.0–36.0)
MCV: 87.5 fL (ref 78.0–100.0)
Platelets: 184 10*3/uL (ref 150–400)
RBC: 3.76 MIL/uL — ABNORMAL LOW (ref 3.87–5.11)
RDW: 12.9 % (ref 11.5–15.5)
WBC: 7.2 10*3/uL (ref 4.0–10.5)

## 2016-04-18 LAB — BASIC METABOLIC PANEL
Anion gap: 10 (ref 5–15)
BUN: 16 mg/dL (ref 6–20)
CHLORIDE: 102 mmol/L (ref 101–111)
CO2: 24 mmol/L (ref 22–32)
CREATININE: 0.81 mg/dL (ref 0.44–1.00)
Calcium: 9 mg/dL (ref 8.9–10.3)
GFR calc Af Amer: >60 mL/min (ref >60)
GFR calc non Af Amer: >60 mL/min (ref >60)
Glucose, Bld: 198 mg/dL — ABNORMAL HIGH (ref 65–99)
Potassium: 3.8 mmol/L (ref 3.5–5.1)
Sodium: 136 mmol/L (ref 135–145)

## 2016-04-18 NOTE — Progress Notes (Signed)
CARDIAC REHAB PHASE I   PRE:  Rate/Rhythm: 72 SR    BP: sitting 153/69    SaO2:   MODE:  Ambulation: 480 ft   POST:  Rate/Rhythm: 91 SR    BP: sitting 166/44     SaO2:   Pt tolerated walk well without c/o however was SOB after walking. She had increased pace today compared to yesterday. Encouraged her to gradually increase ex/walking at home. No questions this am. Discussed importance of eating breakfast. [place], ACSM 04/18/2016 8:18 AM

## 2016-04-20 ENCOUNTER — Other Ambulatory Visit (HOSPITAL_BASED_OUTPATIENT_CLINIC_OR_DEPARTMENT_OTHER): Payer: No Typology Code available for payment source

## 2016-04-20 DIAGNOSIS — D509 Iron deficiency anemia, unspecified: Secondary | ICD-10-CM

## 2016-04-20 DIAGNOSIS — D508 Other iron deficiency anemias: Secondary | ICD-10-CM

## 2016-04-20 LAB — IRON AND TIBC
%SAT: 30 % (ref 21–57)
Iron: 85 ug/dL (ref 41–142)
TIBC: 288 ug/dL (ref 236–444)
UIBC: 202 ug/dL (ref 120–384)

## 2016-04-20 LAB — FERRITIN: Ferritin: 165 ng/ml (ref 9–269)

## 2016-04-24 ENCOUNTER — Ambulatory Visit: Payer: No Typology Code available for payment source | Admitting: Physician Assistant

## 2016-04-25 ENCOUNTER — Telehealth: Payer: Self-pay | Admitting: Hematology

## 2016-04-25 ENCOUNTER — Ambulatory Visit (HOSPITAL_BASED_OUTPATIENT_CLINIC_OR_DEPARTMENT_OTHER): Payer: No Typology Code available for payment source | Admitting: Hematology

## 2016-04-25 ENCOUNTER — Encounter: Payer: Self-pay | Admitting: Hematology

## 2016-04-25 VITALS — BP 152/63 | HR 71 | Temp 97.9°F | Resp 17 | Ht 59.5 in | Wt 178.9 lb

## 2016-04-25 DIAGNOSIS — E119 Type 2 diabetes mellitus without complications: Secondary | ICD-10-CM

## 2016-04-25 DIAGNOSIS — D500 Iron deficiency anemia secondary to blood loss (chronic): Secondary | ICD-10-CM | POA: Diagnosis not present

## 2016-04-25 DIAGNOSIS — I2510 Atherosclerotic heart disease of native coronary artery without angina pectoris: Secondary | ICD-10-CM

## 2016-04-25 DIAGNOSIS — Z1231 Encounter for screening mammogram for malignant neoplasm of breast: Secondary | ICD-10-CM

## 2016-04-25 NOTE — Telephone Encounter (Signed)
Appointments scheduled per 10/25 LOS. Patient informed to call [facility] for [facility]ogram, per it's her annual screening. AVS report and calendar with future scheduled appointments.

## 2016-04-25 NOTE — Progress Notes (Signed)
[place]  [phone] [phone]  Clinic follow Up Note   Patient Care Team: Carol Ada, MD as PCP - General (Family Medicine) Sueanne Margarita, MD as Consulting Physician (Cardiology) 04/25/2016  CHIEF COMPLAINTS:  Follow up anemia   HISTORY OF PRESENTING ILLNESS:  [name] 65 y.o. female is here because of anemia.  She was found to have abnormal CBC since she was a child. She used to eat liver when she was a child. She does not remember her blood counts. She was not taking oral iron supplement until 6 months ago, she is on MVI with iron (18mg ) once daily. Her last CBC done by her PCP from 11/10/2014 showed WBC 10.2, normal differential, hemoglobin 10.1, hematocrit 31.0, MCV 83.9. Platelet count normal at 2 03/03/2014  She denies recent chest pain on exertion, she does have some shortness of breath on mild exertion, such as walking for a block, pre-syncopal episodes, or palpitations. She had not noticed any recent bleeding such as epistaxis, hematuria or hematochezia The patient takes aspirin 81mg  daily, no other NSAID Her last colonoscopy was sabout 5 years ago at [facility], she also had EGD, which all negative per patient.  She had no prior history or diagnosis of cancer. Her last screening mammogram was several years ago.  She denies any pica and eats a variety of diet. She never donated blood or received blood transfusion She had normal period, not heavy, and LMP was over 10 years ago.   She has some pain at left shoulder blade for a few years, no limited ROM of left shoulder. She otherwise feels well overball. 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   CURRENT THERAPY: iv Feraheme 510mg  weekly X2 as needed, she received on 03/08/15, 03/15/2015, ferrous sulfate 1 tablet daily  INTERIM HISTORY [name] returns for follow-up. She is here for herself. She recently has some chest pain and underwent a  cardiac cath and had one stent placed last week. She has mild to moderate fatigue, able to function well at home. No dyspnea on exertion. She denies any abdominal discomfort, or GI bleeding. She is on ferrous sulfate 1 tablet daily, tolerating well. No other complaints.  MEDICAL HISTORY:  Past Medical History:  Diagnosis Date  . Anemia    "I see dr. Burr Medico @ [place]; I've had transfusion for low iron (04/17/2016)  . Anxiety   . Arthritis    "left hip, fingers"  (04/17/2016)  . CAD (coronary artery disease)    a. s/p 2007  LAD stent. b. s/p 07/2006 CABG with LIMA-LAD & SVG to Diag. c. 12/2006 diagonal branch w/ 2.5 x 12 mm Endeavor stent. d. s/p 2010 cath with small vessel disease treated with RX, stable by repeat 2015. e. CP/abnl nuc with LHC 04/17/16: new ostial stenosis of LIMA-LAD s/p DES, EF 45%, chronically occ SVG-D2.  Marland Kitchen Chronic combined systolic and diastolic CHF (congestive heart failure) ([place])   . Depression   . GERD (gastroesophageal reflux disease)   . [id number])    "maybe once/month" (04/17/2016)  . Hyperlipidemia   . Hypertension   . Migraines    "couple times/yr"  ((04/17/2016)  . Myocardial infarction ?2008  . Obesity   . OSA on CPAP    "suppose to wear mask; can't find it" (10/17//2017)  . Osteopenia 12/2010 &01/21/2013   Minimal of hip DEXA   . Pneumonia    "several years" (04/17/2016)  . Retinopathy 12/08  right  . RLS (restless legs syndrome)   . Thyroid disease    "blood work didn't show it but my dr said he can tell by looking at me; I had radiation" (04/17/2016)  . Type II diabetes mellitus ([place])     SURGICAL HISTORY: Past Surgical History:  Procedure Laterality Date  . CARDIAC CATHETERIZATION  02/03/2008   for CP patent LIMA w mid-vessel spasm.  Marland Kitchen CARDIAC CATHETERIZATION N/A 04/17/2016   Procedure: Left Heart Cath and Cors/Grafts Angiography;  Surgeon: Peter M Martinique, MD;  Location: [facility];  Service: Cardiovascular;  Laterality: N/A;    . CARDIAC CATHETERIZATION N/A 04/17/2016   Procedure: Coronary Stent Intervention;  Surgeon: Peter M Martinique, MD;  Location: [facility];  Service: Cardiovascular;  Laterality: N/A;  . COLONOSCOPY  10/2010   rectal polyp  . CORONARY ANGIOPLASTY WITH STENT PLACEMENT     "I've had quite a few stents"   . CORONARY ANGIOPLASTY WITH STENT PLACEMENT  04/17/2016  . CORONARY ARTERY BYPASS GRAFT  ?2008   s/p two vessel CABG off pump LAD diagonal and PTCA/DE Stent mid LAD/ DE stent LAD diagonal through previous stent sstruts,occluded SVG-diagonal  . LAPAROSCOPIC CHOLECYSTECTOMY  1990's  . LEFT HEART CATHETERIZATION WITH CORONARY ANGIOGRAM N/A 07/15/2013   Procedure: LEFT HEART CATHETERIZATION WITH CORONARY ANGIOGRAM;  Surgeon: Peter M Martinique, MD;  Location: [facility] CATH [facility];  Service: Cardiovascular;  Laterality: N/A;  . TUBAL LIGATION  1976    SOCIAL HISTORY: Social History   Social History  . Marital status: Married    Spouse name: N/A  . Number of children: N/A  . Years of education: N/A   Occupational History  . Not on file.   Social History Main Topics  . Smoking status: Former Smoker    Years: 25.00    Types: Cigarettes    Quit date: 07/02/1989  . Smokeless tobacco: Never Used     Comment: "smoked 2-3 cigarettes/week"  . Alcohol use No  . Drug use: No  . Sexual activity: Yes   Other Topics Concern  . Not on file   Social History Narrative  . No narrative on file    FAMILY HISTORY: Family History  Problem Relation Age of Onset  . Cancer Father     Lung  . Hypertension Mother   . Cancer Mother     Lung  . Diabetes Mother   . Diabetes Maternal Grandmother   . Alzheimer's disease Maternal Grandmother     ALLERGIES:  has No Known Allergies.  MEDICATIONS:  Current Outpatient Prescriptions  Medication Sig Dispense Refill  . acetaminophen (TYLENOL) 500 MG tablet Take 2 tablets (1,000 mg total) by mouth every 6 (six) hours as needed for moderate pain or headache.     Marland Kitchen aspirin EC 81 MG tablet Take 1 tablet (81 mg total) by mouth daily. 30 tablet 11  . atorvastatin (LIPITOR) 80 MG tablet Take 1 tablet (80 mg total) by mouth every evening. 90 tablet 3  . clopidogrel (PLAVIX) 75 MG tablet Take 1 tablet (75 mg total) by mouth daily. 30 tablet 11  . escitalopram (LEXAPRO) 10 MG tablet Take 1 tablet (10 mg total) by mouth daily. (Patient taking differently: Take 10 mg by mouth every morning. ) 90 tablet 3  . ferrous sulfate 325 (65 FE) MG EC tablet Take 1 tablet (325 mg total) by mouth daily with breakfast. 90 tablet 3  . hydrochlorothiazide (HYDRODIURIL) 25 MG tablet Take 1 tablet (25 mg total)  by mouth daily. (Patient taking differently: Take 25 mg by mouth every morning. ) 90 tablet 3  . isosorbide mononitrate (IMDUR) 60 MG 24 hr tablet Take 1 tablet (60 mg total) by mouth daily. (Patient taking differently: Take 60 mg by mouth every morning. ) 90 tablet 3  . lisinopril (PRINIVIL,ZESTRIL) 5 MG tablet Take 1 tablet (5 mg total) by mouth daily. (Patient taking differently: Take 5 mg by mouth every morning. ) 30 tablet 3  . metFORMIN (GLUCOPHAGE) 1000 MG tablet Take 1 tablet (1,000 mg total) by mouth 2 (two) times daily with a meal. 90 tablet 3  . metoprolol tartrate (LOPRESSOR) 25 MG tablet Take 1 tablet (25 mg total) by mouth 2 (two) times daily. 180 tablet 3  . nitroGLYCERIN (NITROSTAT) 0.4 MG SL tablet Place 1 tablet (0.4 mg total) under the tongue every 5 (five) minutes as needed for chest pain. 25 tablet 3  . pantoprazole (PROTONIX) 40 MG tablet Take 1 tablet (40 mg total) by mouth daily. 30 tablet 1   No current facility-administered medications for this visit.     REVIEW OF SYSTEMS:   Constitutional: Denies fevers, chills or abnormal night sweats Eyes: Denies blurriness of vision, double vision or watery eyes Ears, nose, mouth, throat, and face: Denies mucositis or sore throat Respiratory: Denies cough, dyspnea or wheezes Cardiovascular: Denies  palpitation, chest discomfort or lower extremity swelling Gastrointestinal:  Denies nausea, heartburn or change in bowel habits Skin: Denies abnormal skin rashes Lymphatics: Denies new lymphadenopathy or easy bruising Neurological:Denies numbness, tingling or new weaknesses Behavioral/Psych: Mood is stable, no new changes  All other systems were reviewed with the patient and are negative.  PHYSICAL EXAMINATION: ECOG PERFORMANCE STATUS: 0  Vitals:   04/25/16 0952  BP: (!) 152/63  Pulse: 71  Resp: 17  Temp: 97.9 F (36.6 C)   Filed Weights   04/25/16 0952  Weight: 178 lb 14.4 oz (81.1 kg)    GENERAL:alert, no distress and comfortable SKIN: skin color, texture, turgor are normal, no rashes or significant lesions, except a few ecchymosis on her left arm and right leg.  EYES: normal, conjunctiva are pink and non-injected, sclera clear OROPHARYNX:no exudate, no erythema and lips, buccal mucosa, and tongue normal  NECK: supple, thyroid normal size, non-tender, without nodularity LYMPH:  no palpable lymphadenopathy in the cervical, axillary or inguinal LUNGS: clear to auscultation and percussion with normal breathing effort, scatter crackers on bilaterally lung basis HEART: regular rate & rhythm and no murmurs and no lower extremity edema ABDOMEN:abdomen soft, normal bowel sounds, (+) mild tenderness at the left upper quadrant of abdomen, no organomegaly. Musculoskeletal:no cyanosis of digits and no clubbing, (+)  bilateral pitting edema up to any  PSYCH: alert & oriented x 3 with fluent speech NEURO: no focal motor/sensory deficits  LABORATORY DATA:  I have reviewed the data as listed CBC Latest Ref Rng & Units 04/18/2016 04/16/2016 10/04/2015  WBC 4.0 - 10.5 K/uL 7.2 9.1 8.3  Hemoglobin 12.0 - 15.0 g/dL 11.3(L) 12.9 12.9  Hematocrit 36.0 - 46.0 % 32.9(L) 38.4 37.1  Platelets 150 - 400 K/uL 184 217 209    CMP Latest Ref Rng & Units 04/18/2016 04/16/2016 04/12/2016  Glucose 65 -  99 mg/dL 198(H) 179(H) 166(H)  BUN 6 - 20 mg/dL 16 25 22   Creatinine 0.44 - 1.00 mg/dL 0.81 0.85 0.91  Sodium 135 - 145 mmol/L 136 141 137  Potassium 3.5 - 5.1 mmol/L 3.8 4.0 4.8  Chloride 101 - 111 mmol/L 102  103 101  CO2 22 - 32 mmol/L 24 27 22   Calcium 8.9 - 10.3 mg/dL 9.0 9.2 9.0  Total Protein 6.1 - 8.1 g/dL - - 5.8(L)  Total Bilirubin 0.2 - 1.2 mg/dL - - 0.6  Alkaline Phos 33 - 130 U/L - - 123  AST 10 - 35 U/L - - 15  ALT 6 - 29 U/L - - 11   Results for KORTNI, BODNAR (MRN [id number]) as of 04/25/2016 06:46  Ref. Range 01/11/2016 09:06 04/20/2016 09:15  Iron Latest Ref Range: 41 - 142 ug/dL 59 85  UIBC Latest Ref Range: 120 - 384 ug/dL 231 202  TIBC Latest Ref Range: 236 - 444 ug/dL 290 288  %SAT Latest Ref Range: 21 - 57 % 20 (L) 30  Ferritin Latest Ref Range: 9 - 269 ng/ml 110 165   RADIOGRAPHIC STUDIES: I have personally reviewed the radiological images as listed and agreed with the findings in the report. No results found.  ASSESSMENT & PLAN:  65 year old Caucasian female, with long-standing history of anemia since childhood (per patient), was referred for anemia evaluation.  1. Iron deficient anemia  -She had mild anemia was normal MCV and low reticulocyte count. -She had low serum iron level at 34, low transferrin saturation 9%, normal TIBC 372, ferritin 11. This is supportive for iron deficiency anemia, but her anemia is slightly outer portion of her iron deficiency. She may have anemia of chronic disease also. -Her folic acid and 123456 level were normal, SPEP were negative for M protein, TSH was normal, erythropoietin was elevated at 44.8, sedimentation rate was normal, and hemoglobin electrophoresis was normal. -She responded very well to IV Feraheme, her anemia has resolved. -I reviewed her lab results with her, she had a mild anemia last week, after cardiac cast, our study was normal. -She will continue oral ferrous sulfate once daily -Continue monitoring her blood  counts and iron level. -The etiology of iron deficient anemia is not clear, she does have mild diarrhea, left upper quadrant abdominal tenderness, her last colonoscopy was 5 years ago, done at [facility]. I refer her back for GI workup including repeated endoscopy, which is she has not been able to see them due to her remaining balance.  -Her arm level has been normal in the past year, we'll monitor every 4 months.  2. Diabetes, CAD,  -She'll continue follow-up with her primary care physician and her cardiologist Dr. Heron Nay   3. Cancer screening -She has not had a mammogram for several years, I'll schedule 1 for her at breast center (she used to go there)    Plan -CBC and iron studies in 4 months -I'll see her back in 8 months with lab week before -Screening bilateral mammogram at the present  All questions were answered. The patient knows to call the clinic with any problems, questions or concerns. I spent 15 minutes counseling the patient face to face. The total time spent in the appointment was 20 minutes and more than 50% was on counseling.     Truitt Merle, MD 04/25/16

## 2016-04-27 NOTE — Progress Notes (Signed)
Cardiology Office Note    Date:  04/30/2016  ID:  [name], DOB June 19, 1951, MRN [id number] PCP:  Reginia Naas, MD  Cardiologist: Dr. Radford Pax   Chief Complaint: f/u cath  History of Present Illness:  [name] is a 65 y.o. female with history of CAD (LAD stent 2007, CABG in 07/2006, Endeavor stent to diagonal 12/2006, cath 2010 with small vessel dz, stable cath 07/2013, s/p DES to the LIMA-LAD in 04/2016), depression, GERD, Graves disease, HTN, migraines, iron deficiency anemia, arthritis, obesity, retinopathy, DM, RLS, chronic LE (at end of the day), chronic combined CHF who presents for post-cath f/u.   Last echo 12/2014 showed EF 40-45%, grade 1 DD, mild LAE. She has been on medical therapy but recently ran out of her meds for the past 4 months - needed new rx's and owed a bill to PCP and could not be seen until she paid it. Saw Dr. Radford Pax 03/19/16 at which time she was reporting some chest heaviness, different than prior angina, nonexertional. It was felt possibly related to her uncontrolled BP. She was also reporting chronic dependent LEE at the end of the day and chronic dizziness with balance. Beta blocker, diuretic, statin, metformin were restarted and Lovaza was added. Labs 03/28/16: BUN 18, Cr 0.9, glu 184, K 3.9, A1C 10, trig 308, HDL 34, LDL 57, alk phos 135, Tprot 5.9 otherwise LFTs OK. She returned for follow-up 04/09/16 to determine if further ischemic workup was needed. She was still reporting CP with mixed typical/atypical features. Nuclear stress test 04/12/16 was intermediate risk with prominent apical thinning vs small prior infarct; very mild apical ischemia; EF 36% with global hypokinesis and mild LVE. The patient was referred for cardiac cath which was performed 04/17/16 showing new ostial stenosis at the origin of the LIMA-LAD, known chronic occlusion of SVG-diagonal, EF 45%. She received a 2.5X12 Promus drug eluting stent to the LIMA-LAD. DAPT x 1 year was  recommended. She was initially considered a good candidate for same day PCI discharge but had some groin oozing and was observed overnight and did well. Is also being followed in the cancer center for iron deficiency anemia. F/u LDL 04/2016 was 67.  She presents back to follow-up noting 2 issues: 1) occasional sharp stab of chest pain lasting a second, non-exertional, happens when she's doing nothing at all, resolves instantly. No recurrent anginal symptoms or chest pain with exertion. 2) Diarrhea since recent discharge - going 1-3x a day with urgency when she feels the need to go, somewhat watery, no bleeding reported. Recent med changes around time this began are Plavix, Protonix, and iron. No fevers, chills, SOB, LEE, syncope.    Past Medical History:  Diagnosis Date  . Anemia    "I see dr. Burr Medico @ [place]; I've had transfusion for low iron (04/17/2016)  . Anxiety   . Arthritis    "left hip, fingers"  (04/17/2016)  . CAD (coronary artery disease)    a. s/p 2007  LAD stent. b. s/p 07/2006 CABG with LIMA-LAD & SVG to Diag. c. 12/2006 diagonal branch w/ 2.5 x 12 mm Endeavor stent. d. s/p 2010 cath with small vessel disease treated with RX, stable by repeat 2015. e. CP/abnl nuc with LHC 04/17/16: new ostial stenosis of LIMA-LAD s/p DES, EF 45%, chronically occ SVG-D2.  Marland Kitchen Chronic combined systolic and diastolic CHF (congestive heart failure) ([place])   . Depression   . GERD (gastroesophageal reflux disease)   . Headache(784.0)    "  maybe once/month" (04/17/2016)  . Hyperlipidemia   . Hypertension   . Migraines    "couple times/yr"  ((04/17/2016)  . Myocardial infarction ?2008  . Obesity   . OSA on CPAP    "suppose to wear mask; can't find it" (10/17//2017)  . Osteopenia 12/2010 &01/21/2013   Minimal of hip DEXA   . Pneumonia    "several years" (04/17/2016)  . Retinopathy 12/08   right  . RLS (restless legs syndrome)   . Thyroid disease    "blood work didn't show it but my dr said he can  tell by looking at me; I had radiation" (04/17/2016)  . Type II diabetes mellitus ([place])     Past Surgical History:  Procedure Laterality Date  . CARDIAC CATHETERIZATION  02/03/2008   for CP patent LIMA w mid-vessel spasm.  Marland Kitchen CARDIAC CATHETERIZATION N/A 04/17/2016   Procedure: Left Heart Cath and Cors/Grafts Angiography;  Surgeon: Peter M Martinique, MD;  Location: [facility];  Service: Cardiovascular;  Laterality: N/A;  . CARDIAC CATHETERIZATION N/A 04/17/2016   Procedure: Coronary Stent Intervention;  Surgeon: Peter M Martinique, MD;  Location: [facility];  Service: Cardiovascular;  Laterality: N/A;  . COLONOSCOPY  10/2010   rectal polyp  . CORONARY ANGIOPLASTY WITH STENT PLACEMENT     "I've had quite a few stents"   . CORONARY ANGIOPLASTY WITH STENT PLACEMENT  04/17/2016  . CORONARY ARTERY BYPASS GRAFT  ?2008   s/p two vessel CABG off pump LAD diagonal and PTCA/DE Stent mid LAD/ DE stent LAD diagonal through previous stent sstruts,occluded SVG-diagonal  . LAPAROSCOPIC CHOLECYSTECTOMY  1990's  . LEFT HEART CATHETERIZATION WITH CORONARY ANGIOGRAM N/A 07/15/2013   Procedure: LEFT HEART CATHETERIZATION WITH CORONARY ANGIOGRAM;  Surgeon: Peter M Martinique, MD;  Location: [facility];  Service: Cardiovascular;  Laterality: N/A;  . TUBAL LIGATION  1976    Current Medications: Current Outpatient Prescriptions  Medication Sig Dispense Refill  . acetaminophen (TYLENOL) 500 MG tablet Take 2 tablets (1,000 mg total) by mouth every 6 (six) hours as needed for moderate pain or headache.    Marland Kitchen aspirin EC 81 MG tablet Take 1 tablet (81 mg total) by mouth daily. 30 tablet 11  . atorvastatin (LIPITOR) 80 MG tablet Take 1 tablet (80 mg total) by mouth every evening. 90 tablet 3  . clopidogrel (PLAVIX) 75 MG tablet Take 1 tablet (75 mg total) by mouth daily. 30 tablet 11  . escitalopram (LEXAPRO) 10 MG tablet Take 1 tablet (10 mg total) by mouth daily. (Patient taking differently: Take 10 mg by mouth  every morning. ) 90 tablet 3  . ferrous sulfate 325 (65 FE) MG EC tablet Take 1 tablet (325 mg total) by mouth daily with breakfast. 90 tablet 3  . hydrochlorothiazide (HYDRODIURIL) 25 MG tablet Take 1 tablet (25 mg total) by mouth daily. (Patient taking differently: Take 25 mg by mouth every morning. ) 90 tablet 3  . isosorbide mononitrate (IMDUR) 60 MG 24 hr tablet Take 1 tablet (60 mg total) by mouth daily. (Patient taking differently: Take 60 mg by mouth every morning. ) 90 tablet 3  . lisinopril (PRINIVIL,ZESTRIL) 5 MG tablet Take 1 tablet (5 mg total) by mouth daily. (Patient taking differently: Take 5 mg by mouth every morning. ) 30 tablet 3  . metFORMIN (GLUCOPHAGE) 1000 MG tablet Take 1 tablet (1,000 mg total) by mouth 2 (two) times daily with a meal. 90 tablet 3  . metoprolol tartrate (LOPRESSOR) 25 MG  tablet Take 1 tablet (25 mg total) by mouth 2 (two) times daily. 180 tablet 3  . nitroGLYCERIN (NITROSTAT) 0.4 MG SL tablet Place 1 tablet (0.4 mg total) under the tongue every 5 (five) minutes as needed for chest pain. 25 tablet 3  . pantoprazole (PROTONIX) 40 MG tablet Take 1 tablet (40 mg total) by mouth daily. 30 tablet 1   No current facility-administered medications for this visit.      Allergies:   Review of patient's allergies indicates no known allergies.   Social History   Social History  . Marital status: Married    Spouse name: N/A  . Number of children: N/A  . Years of education: N/A   Social History Main Topics  . Smoking status: Former Smoker    Years: 25.00    Types: Cigarettes    Quit date: 07/02/1989  . Smokeless tobacco: Never Used     Comment: "smoked 2-3 cigarettes/week"  . Alcohol use No  . Drug use: No  . Sexual activity: Yes   Other Topics Concern  . None   Social History Narrative  . None     Family History:  The patient's family history includes Alzheimer's disease in her maternal grandmother; Cancer in her father and mother; Diabetes in her  maternal grandmother and mother; Hypertension in her mother.   ROS:   Please see the history of present illness.  All other systems are reviewed and otherwise negative.    PHYSICAL EXAM:   VS:  BP 124/68   Pulse 68   Ht 4' 11.5" (1.511 m)   Wt 178 lb (80.7 kg)   BMI 35.35 kg/m   BMI: Body mass index is 35.35 kg/m. GEN: Well nourished, well developed WF, in no acute distress  HEENT: normocephalic, atraumatic Neck: no JVD, carotid bruits, or masses Cardiac: RRR; no murmurs, rubs, or gallops, no edema  Respiratory:  clear to auscultation bilaterally, normal work of breathing GI: soft, nontender, nondistended, + BS MS: no deformity or atrophy  Skin: warm and dry, no rash, right groin cath site without hematoma, ecchymosis, or bruit. Neuro:  Alert and Oriented x 3, Strength and sensation are intact, follows commands Psych: euthymic mood, full affect  Wt Readings from Last 3 Encounters:  04/30/16 178 lb (80.7 kg)  04/25/16 178 lb 14.4 oz (81.1 kg)  04/18/16 183 lb (83 kg)      Studies/Labs Reviewed:   EKG:  EKG was not ordered today.  Recent Labs: 04/12/2016: ALT 11 04/18/2016: BUN 16; Creatinine, Ser 0.81; Hemoglobin 11.3; Platelets 184; Potassium 3.8; Sodium 136   Lipid Panel    Component Value Date/Time   CHOL 148 04/12/2016 0734   TRIG 208 (H) 04/12/2016 0734   HDL 39 (L) 04/12/2016 0734   CHOLHDL 3.8 04/12/2016 0734   VLDL 42 (H) 04/12/2016 0734   LDLCALC 67 04/12/2016 0734    Additional studies/ records that were reviewed today include: Summarized above.    ASSESSMENT & PLAN:   1. CAD - doing well s/p PCI. Mild residual atypical chest pain, unlikely to be related to recurrent obstruction. We reviewed warning symptoms and she will let us know if she has any symptoms worrisome for angina. She wishes to participate in cardiac rehab - told her to let us know if she does not hear back regarding starting the program. Continue ASA, BB, statin, Plavix,  Imdur. 2. Chronic combined CHF - appears euvolemic. Continue current regimen. 3. Hypertension - controlled. 4. Iron deficiency anemia -  per heme/onc. She wonders if the iron could be causing her diarrhea. It usually causes constipation but can have the opposite effect in some patients. As below, will swap out Protonix first. If this does not help, would recommend she discuss iron with her hematologist. 5. Diarrhea - she is not having the high volume stools to suggest c-diff. No sick contacts or other infective symptoms. Will stop Protonix and switch to Zantac. As above if this does not help, would advise she discuss her iron supplement with her hematologist. She could also be having some GI side effects related to recently restarting metformin. Will check CBC, BMET today to evaluate severity of diarrhea and possible effect on lytes. Ultimately I advised she obtain a PCP ASAP to further evaluate, including possibility of going to urgent care in the interim if there is a waitlist for an appointment. Reviewed supportive care measures (including staying hydrated in the context of not going overboard given h/o CHF).  Disposition: F/u with Dr. Radford Pax in 3 months.    Medication Adjustments/Labs and Tests Ordered: Current medicines are reviewed at length with the patient today.  Concerns regarding medicines are outlined above. Medication changes, Labs and Tests ordered today are summarized above and listed in the Patient Instructions accessible in Encounters.   Raechel Ache PA-C  04/30/2016 8:46 AM    [address], [place], [place]  [address] Phone: [phone]; Fax: [phone]

## 2016-04-30 ENCOUNTER — Other Ambulatory Visit: Payer: Medicare Other

## 2016-04-30 ENCOUNTER — Encounter: Payer: Self-pay | Admitting: Physician Assistant

## 2016-04-30 ENCOUNTER — Ambulatory Visit (INDEPENDENT_AMBULATORY_CARE_PROVIDER_SITE_OTHER): Payer: No Typology Code available for payment source | Admitting: Physician Assistant

## 2016-04-30 VITALS — BP 124/68 | HR 68 | Ht 59.5 in | Wt 178.0 lb

## 2016-04-30 DIAGNOSIS — I10 Essential (primary) hypertension: Secondary | ICD-10-CM | POA: Diagnosis not present

## 2016-04-30 DIAGNOSIS — I2510 Atherosclerotic heart disease of native coronary artery without angina pectoris: Secondary | ICD-10-CM

## 2016-04-30 DIAGNOSIS — I25810 Atherosclerosis of coronary artery bypass graft(s) without angina pectoris: Secondary | ICD-10-CM | POA: Diagnosis not present

## 2016-04-30 DIAGNOSIS — D509 Iron deficiency anemia, unspecified: Secondary | ICD-10-CM

## 2016-04-30 DIAGNOSIS — R197 Diarrhea, unspecified: Secondary | ICD-10-CM

## 2016-04-30 DIAGNOSIS — I5042 Chronic combined systolic (congestive) and diastolic (congestive) heart failure: Secondary | ICD-10-CM | POA: Diagnosis not present

## 2016-04-30 LAB — BASIC METABOLIC PANEL
BUN: 20 mg/dL (ref 7–25)
CALCIUM: 9.3 mg/dL (ref 8.6–10.4)
CO2: 25 mmol/L (ref 20–31)
CREATININE: 0.95 mg/dL (ref 0.50–0.99)
Chloride: 106 mmol/L (ref 98–110)
GLUCOSE: 154 mg/dL — AB (ref 65–99)
Potassium: 4.3 mmol/L (ref 3.5–5.3)
Sodium: 140 mmol/L (ref 135–146)

## 2016-04-30 LAB — CBC
HCT: 36.2 % (ref 35.0–45.0)
Hemoglobin: 12.5 g/dL (ref 11.7–15.5)
MCH: 30.9 pg (ref 27.0–33.0)
MCHC: 34.5 g/dL (ref 32.0–36.0)
MCV: 89.4 fL (ref 80.0–100.0)
MPV: 10 fL (ref 7.5–12.5)
PLATELETS: 242 10*3/uL (ref 140–400)
RBC: 4.05 MIL/uL (ref 3.80–5.10)
RDW: 13.7 % (ref 11.0–15.0)
WBC: 9.4 10*3/uL (ref 3.8–10.8)

## 2016-04-30 MED ORDER — RANITIDINE HCL 150 MG PO TABS: 150.0000 mg | ORAL_TABLET | Freq: Every day | ORAL | 2 refills | Status: DC

## 2016-04-30 NOTE — Patient Instructions (Addendum)
Medication Instructions:  Your physician has recommended you make the following change in your medication:  1.  STOP the Protonix 2.  START the Zantac 150 mg taking 1 tablet daily   Labwork: TODAY:  BMET & CBC  Testing/Procedures: None ordered  Follow-Up: Your physician recommends that you schedule a follow-up appointment in: 3 MONTHS WITH DR. Radford Pax    Any Other Special Instructions Will Be Listed Below (If Applicable).  If you need a refill on your cardiac medications before your next appointment, please call your pharmacy.

## 2016-07-09 ENCOUNTER — Telehealth (HOSPITAL_COMMUNITY): Payer: Self-pay | Admitting: Cardiac Rehabilitation

## 2016-07-09 NOTE — Telephone Encounter (Signed)
Phone call to discuss enrolling in cardiac rehab.  Pt declined, she has participated in program previously and is currently exercising on her own.

## 2016-07-20 ENCOUNTER — Other Ambulatory Visit: Payer: Self-pay | Admitting: Physician Assistant

## 2016-07-22 ENCOUNTER — Other Ambulatory Visit: Payer: Self-pay | Admitting: Physician Assistant

## 2016-07-24 NOTE — Telephone Encounter (Signed)
AVS Reports   Date/Time Report Action User  04/30/2016 9:00 AM After Visit Summary Printed Jeanann Lewandowsky, RMA  Patient Instructions   Medication Instructions:  Your physician has recommended you make the following change in your medication:  1.  STOP the Protonix 2.  START the Zantac 150 mg taking 1 tablet daily

## 2016-08-01 ENCOUNTER — Ambulatory Visit: Payer: No Typology Code available for payment source | Admitting: Cardiology

## 2016-08-02 ENCOUNTER — Encounter: Payer: Self-pay | Admitting: Cardiology

## 2016-08-21 ENCOUNTER — Other Ambulatory Visit: Payer: Self-pay | Admitting: Physician Assistant

## 2016-08-27 ENCOUNTER — Other Ambulatory Visit: Payer: No Typology Code available for payment source

## 2016-09-25 ENCOUNTER — Other Ambulatory Visit: Payer: Self-pay | Admitting: Cardiology

## 2016-09-26 NOTE — Telephone Encounter (Signed)
Okay to refill? Please advise. Thanks, MI 

## 2016-10-17 ENCOUNTER — Other Ambulatory Visit: Payer: No Typology Code available for payment source

## 2016-10-31 ENCOUNTER — Other Ambulatory Visit: Payer: Self-pay | Admitting: Physician Assistant

## 2016-11-06 ENCOUNTER — Emergency Department (HOSPITAL_COMMUNITY): Payer: Medicare Other

## 2016-11-06 ENCOUNTER — Emergency Department (HOSPITAL_COMMUNITY)
Admission: EM | Admit: 2016-11-06 | Discharge: 2016-11-06 | Disposition: A | Discharge status: Home / Self Care | Payer: Medicare Other | Attending: Emergency Medicine | Admitting: Emergency Medicine

## 2016-11-06 ENCOUNTER — Encounter (HOSPITAL_COMMUNITY): Payer: Self-pay | Admitting: Emergency Medicine

## 2016-11-06 DIAGNOSIS — I110 Hypertensive heart disease with heart failure: Secondary | ICD-10-CM | POA: Diagnosis not present

## 2016-11-06 DIAGNOSIS — Z955 Presence of coronary angioplasty implant and graft: Secondary | ICD-10-CM | POA: Diagnosis not present

## 2016-11-06 DIAGNOSIS — R072 Precordial pain: Secondary | ICD-10-CM | POA: Insufficient documentation

## 2016-11-06 DIAGNOSIS — R51 Headache: Secondary | ICD-10-CM | POA: Diagnosis not present

## 2016-11-06 DIAGNOSIS — R079 Chest pain, unspecified: Secondary | ICD-10-CM | POA: Diagnosis not present

## 2016-11-06 DIAGNOSIS — R519 Headache, unspecified: Secondary | ICD-10-CM

## 2016-11-06 DIAGNOSIS — Z7982 Long term (current) use of aspirin: Secondary | ICD-10-CM | POA: Insufficient documentation

## 2016-11-06 DIAGNOSIS — I252 Old myocardial infarction: Secondary | ICD-10-CM | POA: Diagnosis not present

## 2016-11-06 DIAGNOSIS — Z79899 Other long term (current) drug therapy: Secondary | ICD-10-CM | POA: Diagnosis not present

## 2016-11-06 DIAGNOSIS — I25810 Atherosclerosis of coronary artery bypass graft(s) without angina pectoris: Secondary | ICD-10-CM | POA: Insufficient documentation

## 2016-11-06 DIAGNOSIS — E119 Type 2 diabetes mellitus without complications: Secondary | ICD-10-CM | POA: Diagnosis not present

## 2016-11-06 DIAGNOSIS — Z7984 Long term (current) use of oral hypoglycemic drugs: Secondary | ICD-10-CM | POA: Insufficient documentation

## 2016-11-06 DIAGNOSIS — Z951 Presence of aortocoronary bypass graft: Secondary | ICD-10-CM | POA: Diagnosis not present

## 2016-11-06 DIAGNOSIS — I5042 Chronic combined systolic (congestive) and diastolic (congestive) heart failure: Secondary | ICD-10-CM | POA: Diagnosis not present

## 2016-11-06 DIAGNOSIS — R0789 Other chest pain: Secondary | ICD-10-CM | POA: Diagnosis not present

## 2016-11-06 DIAGNOSIS — Z87891 Personal history of nicotine dependence: Secondary | ICD-10-CM | POA: Diagnosis not present

## 2016-11-06 LAB — BASIC METABOLIC PANEL
Anion gap: 5 (ref 5–15)
BUN: 31 mg/dL — AB (ref 6–20)
CO2: 22 mmol/L (ref 22–32)
Calcium: 8.8 mg/dL — ABNORMAL LOW (ref 8.9–10.3)
Chloride: 113 mmol/L — ABNORMAL HIGH (ref 101–111)
Creatinine, Ser: 1.22 mg/dL — ABNORMAL HIGH (ref 0.44–1.00)
GFR calc Af Amer: 52 mL/min — ABNORMAL LOW (ref >60)
GFR, EST NON AFRICAN AMERICAN: 45 mL/min — AB (ref >60)
Glucose, Bld: 105 mg/dL — ABNORMAL HIGH (ref 65–99)
Potassium: 4.6 mmol/L (ref 3.5–5.1)
SODIUM: 140 mmol/L (ref 135–145)

## 2016-11-06 LAB — CBC
HCT: 37 % (ref 36.0–46.0)
Hemoglobin: 12.8 g/dL (ref 12.0–15.0)
MCH: 31.9 pg (ref 26.0–34.0)
MCHC: 34.6 g/dL (ref 30.0–36.0)
MCV: 92.3 fL (ref 78.0–100.0)
PLATELETS: 267 10*3/uL (ref 150–400)
RBC: 4.01 MIL/uL (ref 3.87–5.11)
RDW: 12.8 % (ref 11.5–15.5)
WBC: 9.9 10*3/uL (ref 4.0–10.5)

## 2016-11-06 LAB — I-STAT TROPONIN, ED
Troponin i, poc: 0 ng/mL (ref 0.00–0.08)
Troponin i, poc: 0.01 ng/mL (ref 0.00–0.08)

## 2016-11-06 LAB — LIPASE, BLOOD: Lipase: 63 U/L — ABNORMAL HIGH (ref 11–51)

## 2016-11-06 MED ORDER — SODIUM CHLORIDE 0.9 % IV BOLUS (SEPSIS)
1000.0000 mL | Freq: Once | INTRAVENOUS | Status: AC
Start: 2016-11-06 — End: 2016-11-06
  Administered 2016-11-06: 1000 mL via INTRAVENOUS

## 2016-11-06 MED ORDER — DIPHENHYDRAMINE HCL 50 MG/ML IJ SOLN
25.0000 mg | Freq: Once | INTRAMUSCULAR | Status: AC
  Administered 2016-11-06: 25 mg via INTRAVENOUS
  Filled 2016-11-06: qty 1

## 2016-11-06 MED ORDER — PROCHLORPERAZINE MALEATE 5 MG PO TABS
10.0000 mg | ORAL_TABLET | Freq: Once | ORAL | Status: AC
  Administered 2016-11-06: 10 mg via ORAL
  Filled 2016-11-06: qty 2

## 2016-11-06 NOTE — ED Provider Notes (Signed)
[facility] DEPT Provider Note   CSN: [id number] Arrival date & time: 11/06/16  1118     History   Chief Complaint Chief Complaint  Patient presents with  . Chest Pain  . Headache    HPI [name] is a 66 y.o. female   With past medical history HTN, CAD status post CABG, who presents today with chief complaint constant midsternal chest pain and mid back pain with associated headache.  She states chest pain and mid back pain began 1 week ago and are constant.  Chest pain is a chest pressure that is occasionally sharp.  Back pain is intermittently sharp.  Nothing aggravates the chest pain or back pain.  After glycerin laying down is helpful.  Patient has not had any today but she has taken some in the past few days.  Chest  Pressure does not seem to be associated with increased activity and will occur at rest.  She states the chest pressure and back pain are typical of her usual chest pain symptoms.  She states she notices small bruises to the  leftside of her upper back near her shoulder where her pain is,  But denies trauma or falls.  Has associated epigastric pain which she says is very dull and is consistent with her GERD,  Nausea,  And 3 episodes of  Watery,  nonbloody diarrhea yesterday.  Denies hematuria, dysuria, melena, hematochezia.  She denies palpitations, lower externally swelling, short of breath, orthopnea, PND.   She developed a generalized left-sided headache 2 days ago  Which is constant and achy in nature.  She is taken Excedrin and Tylenol without relief. Denies photophobia, phonobia, vision changes, numbness, tingling, weakness.   The history is provided by the patient.    Past Medical History:  Diagnosis Date  . Anemia    "I see dr. Burr Medico @ [place]; I've had transfusion for low iron (04/17/2016)  . Anxiety   . Arthritis    "left hip, fingers"  (04/17/2016)  . CAD (coronary artery disease)    a. s/p 2007  LAD stent. b. s/p 07/2006 CABG with LIMA-LAD &  SVG to Diag. c. 12/2006 diagonal branch w/ 2.5 x 12 mm Endeavor stent. d. s/p 2010 cath with small vessel disease treated with RX, stable by repeat 2015. e. CP/abnl nuc with LHC 04/17/16: new ostial stenosis of LIMA-LAD s/p DES, EF 45%, chronically occ SVG-D2.  Marland Kitchen Chronic combined systolic and diastolic CHF (congestive heart failure) ([place])   . Depression   . GERD (gastroesophageal reflux disease)   . UXLKGMWN(027.2)    "maybe once/month" (04/17/2016)  . Hyperlipidemia   . Hypertension   . Migraines    "couple times/yr"  ((04/17/2016)  . Myocardial infarction [facility]) ?2008  . Obesity   . OSA on CPAP    "suppose to wear mask; can't find it" (10/17//2017)  . Osteopenia 12/2010 &01/21/2013   Minimal of hip DEXA   . Pneumonia    "several years" (04/17/2016)  . Retinopathy 12/08   right  . RLS (restless legs syndrome)   . Thyroid disease    "blood work didn't show it but my dr said he can tell by looking at me; I had radiation" (04/17/2016)  . Type II diabetes mellitus [facility])     Patient Active Problem List   Diagnosis Date Noted  . Status post coronary artery stent placement   . Abnormal nuclear stress test 04/17/2016  . Abnormal stress test 04/17/2016  . Iron deficiency anemia 10/11/2015  .  Anemia 12/21/2014  . SOB (shortness of breath) 12/03/2014  . Angina pectoris ([place]) 09/16/2013  . Graves disease 07/14/2013  . Hyperlipidemia 07/14/2013  . Diabetes mellitus type 2, noninsulin dependent ([place]) 07/14/2013  . Hypertension 07/14/2013  . GERD (gastroesophageal reflux disease) 07/14/2013  . Unstable angina ([place]) 07/14/2013  . CAD (coronary artery disease)   . Obesity (BMI 30-39.9)     Past Surgical History:  Procedure Laterality Date  . CARDIAC CATHETERIZATION  02/03/2008   for CP patent LIMA w mid-vessel spasm.  Marland Kitchen CARDIAC CATHETERIZATION N/A 04/17/2016   Procedure: Left Heart Cath and Cors/Grafts Angiography;  Surgeon: Peter M Martinique, MD;  Location: [facility] CV LAB;  Service:  Cardiovascular;  Laterality: N/A;  . CARDIAC CATHETERIZATION N/A 04/17/2016   Procedure: Coronary Stent Intervention;  Surgeon: Peter M Martinique, MD;  Location: [facility] CV LAB;  Service: Cardiovascular;  Laterality: N/A;  . COLONOSCOPY  10/2010   rectal polyp  . CORONARY ANGIOPLASTY WITH STENT PLACEMENT     "I've had quite a few stents"   . CORONARY ANGIOPLASTY WITH STENT PLACEMENT  04/17/2016  . CORONARY ARTERY BYPASS GRAFT  ?2008   s/p two vessel CABG off pump LAD diagonal and PTCA/DE Stent mid LAD/ DE stent LAD diagonal through previous stent sstruts,occluded SVG-diagonal  . LAPAROSCOPIC CHOLECYSTECTOMY  1990's  . LEFT HEART CATHETERIZATION WITH CORONARY ANGIOGRAM N/A 07/15/2013   Procedure: LEFT HEART CATHETERIZATION WITH CORONARY ANGIOGRAM;  Surgeon: Peter M Martinique, MD;  Location: [facility] CATH LAB;  Service: Cardiovascular;  Laterality: N/A;  . TUBAL LIGATION  1976    OB History    No data available       Home Medications    Prior to Admission medications   Medication Sig Start Date End Date Taking? Authorizing Provider  acetaminophen (TYLENOL) 500 MG tablet Take 2 tablets (1,000 mg total) by mouth every 6 (six) hours as needed for moderate pain or headache. Patient taking differently: Take 1,000 mg by mouth every 6 (six) hours as needed (for headaches, migraines, or pain).  04/17/16  Yes Dunn, Nedra Hai, PA-C  aspirin EC 81 MG tablet Take 1 tablet (81 mg total) by mouth daily. 04/17/16  Yes Dunn, Nedra Hai, PA-C  aspirin-acetaminophen-caffeine (EXCEDRIN MIGRAINE) [phone] MG tablet Take 1-2 tablets by mouth every 6 (six) hours as needed for headache or migraine.    Yes [provider]  atorvastatin (LIPITOR) 80 MG tablet Take 1 tablet (80 mg total) by mouth every evening. 03/19/16  Yes Turner, Eber Hong, MD  clopidogrel (PLAVIX) 75 MG tablet Take 1 tablet (75 mg total) by mouth daily. 04/17/16  Yes Dunn, Dayna N, PA-C  escitalopram (LEXAPRO) 10 MG tablet Take 1 tablet (10 mg  total) by mouth daily. Patient taking differently: Take 10 mg by mouth every morning.  03/19/16  Yes Turner, Eber Hong, MD  hydrochlorothiazide (HYDRODIURIL) 25 MG tablet Take 1 tablet (25 mg total) by mouth daily. Patient taking differently: Take 25 mg by mouth every morning.  03/19/16  Yes Turner, Eber Hong, MD  isosorbide mononitrate (IMDUR) 60 MG 24 hr tablet Take 1 tablet (60 mg total) by mouth daily. Patient taking differently: Take 60 mg by mouth every morning.  03/19/16  Yes Turner, Eber Hong, MD  lisinopril (PRINIVIL,ZESTRIL) 5 MG tablet TAKE 1 TABLET BY MOUTH EVERY DAY 08/22/16  Yes Dunn, Dayna N, PA-C  metFORMIN (GLUCOPHAGE) 1000 MG tablet Take 1 tablet (1,000 mg total) by mouth 2 (two) times daily with a meal. 03/19/16  Yes Sueanne Margarita, MD  metoprolol tartrate (LOPRESSOR) 25 MG tablet Take 1 tablet (25 mg total) by mouth 2 (two) times daily. 03/19/16  Yes Turner, Eber Hong, MD  nitroGLYCERIN (NITROSTAT) 0.4 MG SL tablet Place 1 tablet (0.4 mg total) under the tongue every 5 (five) minutes as needed for chest pain. 04/09/16  Yes Dunn, Dayna N, PA-C  ranitidine (ZANTAC) 150 MG tablet TAKE 1 TABLET (150 MG TOTAL) BY MOUTH DAILY. 11/01/16  Yes Dunn, Dayna N, PA-C  ferrous sulfate 325 (65 FE) MG EC tablet Take 1 tablet (325 mg total) by mouth daily with breakfast. Patient not taking: Reported on 11/06/2016 03/19/16   Sueanne Margarita, MD    Family History Family History  Problem Relation Age of Onset  . Cancer Father     Lung  . Hypertension Mother   . Cancer Mother     Lung  . Diabetes Mother   . Diabetes Maternal Grandmother   . Alzheimer's disease Maternal Grandmother     Social History Social History  Substance Use Topics  . Smoking status: Former Smoker    Years: 25.00    Types: Cigarettes    Quit date: 07/02/1989  . Smokeless tobacco: Never Used     Comment: "smoked 2-3 cigarettes/week"  . Alcohol use No     Allergies   Patient has no known allergies.   Review of  Systems Review of Systems  Eyes: Negative for photophobia and visual disturbance.  Respiratory: Positive for chest tightness. Negative for shortness of breath.   Cardiovascular: Positive for chest pain. Negative for palpitations and leg swelling.  Gastrointestinal: Positive for abdominal pain, diarrhea and nausea. Negative for constipation and vomiting.  Genitourinary: Negative for dysuria and hematuria.  Musculoskeletal: Positive for back pain.  Skin: Positive for color change.  Neurological: Positive for headaches. Negative for syncope, weakness and numbness.     Physical Exam Updated Vital Signs BP (!) 108/50   Pulse 66   Temp 97.8 F (36.6 C) (Oral)   Resp 17   SpO2 99%   Physical Exam  Constitutional: She is oriented to person, place, and time. She appears well-developed and well-nourished. No distress.  HENT:  Head: Normocephalic and atraumatic.  Eyes: Right eye exhibits no discharge. Left eye exhibits no discharge. No scleral icterus.  Neck: No JVD present. No tracheal deviation present.  Cardiovascular: Normal rate, regular rhythm, normal heart sounds and intact distal pulses.   2+ DP/PT pulses bilaterally negative Homans bilaterally  Pulmonary/Chest: Effort normal and breath sounds normal. No respiratory distress. She exhibits tenderness.  Well-healed midline sternotomy scar  Abdominal: Soft. Bowel sounds are normal. She exhibits no distension. There is tenderness.  Mild epigastric tenderness to palpation, negative Murphy's, no tenderness to palpation of McBurney's point, negative psoas  Musculoskeletal: Normal range of motion. She exhibits tenderness. She exhibits no edema or deformity.  Full range of motion of bilateral upper extremities and bilateral lower extremities. No midline spine tenderness to palpation. Left posterior upper back tender to palpation around the spine of the scapula. Small areas of ecchymosis surrounding this area. Anterior chest wall tender to  palpation near the sternum. Patient states this is chronic and unchanged.   Neurological: She is alert and oriented to person, place, and time.  Fluent speech, no facial droop, sensation intact globally.   Skin: Skin is warm and dry. Capillary refill takes less than 2 seconds. She is not diaphoretic.  Psychiatric: She has a normal mood and affect. Her behavior  is normal.     ED Treatments / Results  Labs (all labs ordered are listed, but only abnormal results are displayed) Labs Reviewed  BASIC METABOLIC PANEL - Abnormal; Notable for the following:       Result Value   Chloride 113 (*)    Glucose, Bld 105 (*)    BUN 31 (*)    Creatinine, Ser 1.22 (*)    Calcium 8.8 (*)    GFR calc non Af Amer 45 (*)    GFR calc Af Amer 52 (*)    All other components within normal limits  LIPASE, BLOOD - Abnormal; Notable for the following:    Lipase 63 (*)    All other components within normal limits  CBC  I-STAT TROPOININ, ED  I-STAT TROPOININ, ED    EKG  EKG Interpretation  Date/Time:  Tuesday Nov 06 2016 11:23:49 EDT Ventricular Rate:  71 PR Interval:  142 QRS Duration: 76 QT Interval:  388 QTC Calculation: 421 R Axis:   77 Text Interpretation:  Normal sinus rhythm Nonspecific ST and T wave abnormality Abnormal ECG No significant change since last tracing Confirmed by Canary Brim  MD, MARTHA [id number]) on 11/06/2016 3:30:32 PM       Radiology Dg Chest 2 View  Result Date: 11/06/2016 CLINICAL DATA:  Chest pain for few days EXAM: CHEST  2 VIEW COMPARISON:  08/13/2015 FINDINGS: Cardiac shadow is within normal limits. Postsurgical changes are again seen. The lungs are clear bilaterally. No acute bony abnormality is noted. IMPRESSION: No active cardiopulmonary disease. Electronically Signed   By: Inez Catalina M.D.   On: 11/06/2016 12:46    Procedures Procedures (including critical care time)  Medications Ordered in ED Medications  prochlorperazine (COMPAZINE) tablet 10 mg (10 mg Oral Given  11/06/16 1555)  diphenhydrAMINE (BENADRYL) injection 25 mg (25 mg Intravenous Given 11/06/16 1554)  sodium chloride 0.9 % bolus 1,000 mL (0 mLs Intravenous Stopped 11/06/16 1744)     Initial Impression / Assessment and Plan / ED Course  I have reviewed the triage vital signs and the nursing notes.  Pertinent labs & imaging results that were available during my care of the patient were reviewed by me and considered in my medical decision making (see chart for details).     Patient with extensive cardiac history presents today with chief complaint chest pain for one week as well as headache for 2 days. Afebrile, mildly hypotensive on arrival with improvement after administration of fluids. No significant changes to EKG from last, delta troponin is negative, chest x-ray with no active cardiopulmonary disease. Low suspicion of MI, however possible worsening of unstable angina. Low suspicion of PE, pneumonia, or other pulmonary abnormality. No focal neurological deficits. Low suspicion of stroke. Headache consistent with migraine; given migraine cocktail with significant relief of headache on reevaluation.  Dr. Canary Brim spoke with cardiology, who reviewed labwork and imaging today and states pt is stable for discharge home with f/u in office for repeat stress test this week. Discussed with patient who understands and agrees with this plan. Discussed strict ED return precautions. Patient stable for discharge home at this time.  Final Clinical Impressions(s) / ED Diagnoses   Final diagnoses:  Bad headache  Chest pain in adult    New Prescriptions Discharge Medication List as of 11/06/2016  6:41 PM       Renita Papa, PA-C 11/07/16 [place], Martha, MD 11/10/16 [phone]

## 2016-11-06 NOTE — Progress Notes (Signed)
Called by Dr. Canary Brim regarding [name], who has a history of CABG and recent PCI for chest pain and abnormal NST in 04/2016. She had reported chest pain, improved after nitroglycerin, but more after laying down for 10 minutes or so. She also had headache which has resolved. EKG, which I personally reviewed, shows no acute ischemic changes and it is stable compared to a prior tracing. Troponin here is negative x 2. She reportedly is feeling better and wants to go home. I would recommend follow-up in the office next week and consider repeat stress testing if she has any recurrent symptoms.  Pixie Casino, MD, [place]  Attending Cardiologist  Direct Dial: [phone]  Fax: [phone]  Website:  [email or web address]

## 2016-11-06 NOTE — Discharge Instructions (Signed)
Continue taking her medications as prescribed. Follow-up with your cardiologist this week for reevaluation and stress test. Return to the ED if any concerning symptoms develop.

## 2016-11-06 NOTE — ED Notes (Signed)
Pt did not need anything at this time  

## 2016-11-06 NOTE — ED Triage Notes (Signed)
Pt c/o chest pain/back pain-- started approx 1 week ago-- has taken some ntg at home, none today-- no relief with ntg. Pt also c/o headache.

## 2016-11-07 ENCOUNTER — Telehealth: Payer: Self-pay

## 2016-11-07 NOTE — Telephone Encounter (Signed)
FW: Inpatient Notes  Received: Yesterday  Message Contents  Sueanne Margarita, MD  Theodoro Parma, RN        Please get patient in to see me or extender   Traci   Previous Messages    ----- Message -----  From: Pixie Casino, MD  Sent: 11/06/2016  6:30 PM  To: Sueanne Margarita, MD  Subject: Inpatient Notes                       Left message for patient to call back and schedule follow-up appointment.

## 2016-11-12 NOTE — Telephone Encounter (Signed)
Patient has appointment scheduled 6/6. Left message for patient to call back and ask for Nursing if she needs to be seen sooner or symptoms worsen.

## 2016-12-05 ENCOUNTER — Ambulatory Visit (INDEPENDENT_AMBULATORY_CARE_PROVIDER_SITE_OTHER): Payer: Medicare Other | Admitting: Physician Assistant

## 2016-12-05 ENCOUNTER — Encounter: Payer: Self-pay | Admitting: Physician Assistant

## 2016-12-05 ENCOUNTER — Encounter: Payer: Self-pay | Admitting: Gastroenterology

## 2016-12-05 VITALS — BP 132/68 | HR 68 | Ht 59.5 in | Wt 193.0 lb

## 2016-12-05 DIAGNOSIS — I25709 Atherosclerosis of coronary artery bypass graft(s), unspecified, with unspecified angina pectoris: Secondary | ICD-10-CM

## 2016-12-05 DIAGNOSIS — N179 Acute kidney failure, unspecified: Secondary | ICD-10-CM

## 2016-12-05 DIAGNOSIS — I5042 Chronic combined systolic (congestive) and diastolic (congestive) heart failure: Secondary | ICD-10-CM | POA: Diagnosis not present

## 2016-12-05 DIAGNOSIS — I10 Essential (primary) hypertension: Secondary | ICD-10-CM | POA: Diagnosis not present

## 2016-12-05 DIAGNOSIS — I209 Angina pectoris, unspecified: Secondary | ICD-10-CM

## 2016-12-05 DIAGNOSIS — R0789 Other chest pain: Secondary | ICD-10-CM

## 2016-12-05 DIAGNOSIS — K219 Gastro-esophageal reflux disease without esophagitis: Secondary | ICD-10-CM | POA: Diagnosis not present

## 2016-12-05 MED ORDER — RANITIDINE HCL 150 MG PO TABS: 150.0000 mg | ORAL_TABLET | Freq: Two times a day (BID) | ORAL | 2 refills | Status: DC

## 2016-12-05 NOTE — Patient Instructions (Addendum)
Medication Instructions:  Your physician has recommended you make the following change in your medication:  1. Increase Zantac (150 mg ) twice daily, sent in today to patient's pharmacy. 2. Excedrin was taken off of list.    Labwork: Your physician recommends that you have lab work: Bmet   Testing/Procedures: Your physician has requested that you have a lexiscan myoview. For further information please visit [email or web address]. Please follow instruction sheet, as given.    Follow-Up: Your physician recommends that you keep your scheduled  follow-up appointment with Melina Copa, PA   Any Other Special Instructions Will Be Listed Below (If Applicable).  You have been referred to GI doctor, the office will call and get you scheduled for appointment.    If you need a refill on your cardiac medications before your next appointment, please call your pharmacy.

## 2016-12-05 NOTE — Progress Notes (Signed)
Cardiology Office Note    Date:  12/05/2016  ID:  [name], DOB [birth year], MRN [id number] PCP:  Carol Ada, MD  Cardiologist:  Dr. Radford Pax   Chief Complaint: chest pain  History of Present Illness:  [name] is a 66 y.o. female with history of CAD (LAD stent 2007, CABG in 07/2006, Endeavor stent to diagonal 12/2006, cath 2010 with small vessel dz, stable cath 07/2013, s/p DES to the LIMA-LAD in 04/2016), depression, GERD, Graves disease, HTN, migraines, iron deficiency anemia (followed by heme), arthritis, obesity, retinopathy, DM, RLS, chronic LE (at end of the day), chronic combined CHF who presents for post-ER follow-up.  Prior echo 12/2014 showed EF 40-45%, grade 1 DD, mild LAE. She has been on medical therapy but had run out of medications for several months as she owed a bill to PCP and could not be seen until she paid it. She saw Dr. Radford Pax 03/19/16 at which time she was reporting some chest heaviness, different than prior angina, nonexertional. It was felt possibly related to her uncontrolled BP. She was also reporting chronic dependent LEE at the end of the day and chronic dizziness with balance. Beta blocker, diuretic, statin, metformin were restarted and Lovaza was added. At f/u 04/2016 she was  still reporting CP with mixed typical/atypical feature prompting nuclear stress test which was abnormal. Cardiac cath was performed 04/17/16 showing new ostial stenosis at the origin of the LIMA-LAD, known chronic occlusion of SVG-diagonal, EF 45%. She received a 2.5X12 Promus drug eluting stent to the LIMA-LAD. DAPT x 1 year was recommended.She is also followed in the cancer center for iron deficiency anemia. At f/u 04/2016 after cath she was reporting some atypical discomfort described as a sharp stab of chest pain, different than recent angina; monitoring for progressive symptoms recommended.  She was seen in the ED 10/2016 with constant midsternal chest pain x 1 week, described as  pressure with occasional sharpness, with back pain as well, and bruising to her left shoulder. She also had epigastric pain and watery nonbloody diarrhea as well as left-sided headache. She was treated for migraine. EKG and CXR unremarkable as well as troponin x2. Lipase was elevated at 63. BMET showed K 4.6, BUN 31, Cr 1.22, normal CBC. EDP spoke with cardiology who recommended OP f/u. She returns today for follow-up. She's had about 4 more episodes of the above since ED visit, lasting 10-30 minutes where she develops sensation of central chest pressure, back discomfort, headache beginning at the base of her neck. It is not provoked by anything and happens during rest.NTG gives no relief. She goes and lays down when it happens with improvement. She has not noticed any exertional component but does not exercise. She is no longer on Protonix but not sure why.    Past Medical History:  Diagnosis Date  . Anemia    "I see dr. Burr Medico @ [place]; I've had transfusion for low iron (04/17/2016)  . Anxiety   . Arthritis    "left hip, fingers"  (04/17/2016)  . CAD (coronary artery disease)    a. s/p 2007  LAD stent. b. s/p 07/2006 CABG with LIMA-LAD & SVG to Diag. c. 12/2006 diagonal branch w/ 2.5 x 12 mm Endeavor stent. d. s/p 2010 cath with small vessel disease treated with RX, stable by repeat 2015. e. CP/abnl nuc with LHC 04/17/16: new ostial stenosis of LIMA-LAD s/p DES, EF 45%, chronically occ SVG-D2.  Marland Kitchen Chronic combined systolic and diastolic CHF (congestive  heart failure) ([place])   . Depression   . GERD (gastroesophageal reflux disease)   . TDVVOHYW(737.1)    "maybe once/month" (04/17/2016)  . Hyperlipidemia   . Hypertension   . Migraines    "couple times/yr"  ((04/17/2016)  . Myocardial infarction [facility]) ?2008  . Obesity   . OSA on CPAP    "suppose to wear mask; can't find it" (10/17//2017)  . Osteopenia 12/2010 &01/21/2013   Minimal of hip DEXA   . Pneumonia    "several years" (04/17/2016)    . Retinopathy 12/08   right  . RLS (restless legs syndrome)   . Thyroid disease    "blood work didn't show it but my dr said he can tell by looking at me; I had radiation" (04/17/2016)  . Type II diabetes mellitus ([place])     Past Surgical History:  Procedure Laterality Date  . CARDIAC CATHETERIZATION  02/03/2008   for CP patent LIMA w mid-vessel spasm.  Marland Kitchen CARDIAC CATHETERIZATION N/A 04/17/2016   Procedure: Left Heart Cath and Cors/Grafts Angiography;  Surgeon: Peter M Martinique, MD;  Location: [place] [facility];  Service: Cardiovascular;  Laterality: N/A;  . CARDIAC CATHETERIZATION N/A 04/17/2016   Procedure: Coronary Stent Intervention;  Surgeon: Peter M Martinique, MD;  Location: [place] [facility];  Service: Cardiovascular;  Laterality: N/A;  . COLONOSCOPY  10/2010   rectal polyp  . CORONARY ANGIOPLASTY WITH STENT PLACEMENT     "I've had quite a few stents"   . CORONARY ANGIOPLASTY WITH STENT PLACEMENT  04/17/2016  . CORONARY ARTERY BYPASS GRAFT  ?2008   s/p two vessel CABG off pump LAD diagonal and PTCA/DE Stent mid LAD/ DE stent LAD diagonal through previous stent sstruts,occluded SVG-diagonal  . LAPAROSCOPIC CHOLECYSTECTOMY  1990's  . LEFT HEART CATHETERIZATION WITH CORONARY ANGIOGRAM N/A 07/15/2013   Procedure: LEFT HEART CATHETERIZATION WITH CORONARY ANGIOGRAM;  Surgeon: Peter M Martinique, MD;  Location: [facility] CATH LAB;  Service: Cardiovascular;  Laterality: N/A;  . TUBAL LIGATION  1976    Current Medications: Current Outpatient Prescriptions  Medication Sig Dispense Refill  . acetaminophen (TYLENOL) 500 MG tablet Take 2 tablets (1,000 mg total) by mouth every 6 (six) hours as needed for moderate pain or headache. (Patient taking differently: Take 1,000 mg by mouth every 6 (six) hours as needed (for headaches, migraines, or pain). )    . aspirin EC 81 MG tablet Take 1 tablet (81 mg total) by mouth daily. 30 tablet 11  . aspirin-acetaminophen-caffeine (EXCEDRIN MIGRAINE) 062-694-85 MG  tablet Take 1-2 tablets by mouth every 6 (six) hours as needed for headache or migraine.     Marland Kitchen atorvastatin (LIPITOR) 80 MG tablet Take 1 tablet (80 mg total) by mouth every evening. 90 tablet 3  . clopidogrel (PLAVIX) 75 MG tablet Take 1 tablet (75 mg total) by mouth daily. 30 tablet 11  . escitalopram (LEXAPRO) 10 MG tablet Take 1 tablet (10 mg total) by mouth daily. (Patient taking differently: Take 10 mg by mouth every morning. ) 90 tablet 3  . ferrous sulfate 325 (65 FE) MG EC tablet Take 1 tablet (325 mg total) by mouth daily with breakfast. 90 tablet 3  . hydrochlorothiazide (HYDRODIURIL) 25 MG tablet Take 1 tablet (25 mg total) by mouth daily. (Patient taking differently: Take 25 mg by mouth every morning. ) 90 tablet 3  . isosorbide mononitrate (IMDUR) 60 MG 24 hr tablet Take 1 tablet (60 mg total) by mouth daily. (Patient taking differently: Take 60 mg by  mouth every morning. ) 90 tablet 3  . lisinopril (PRINIVIL,ZESTRIL) 5 MG tablet TAKE 1 TABLET BY MOUTH EVERY DAY 30 tablet 7  . metoprolol tartrate (LOPRESSOR) 25 MG tablet Take 1 tablet (25 mg total) by mouth 2 (two) times daily. 180 tablet 3  . nitroGLYCERIN (NITROSTAT) 0.4 MG SL tablet Place 1 tablet (0.4 mg total) under the tongue every 5 (five) minutes as needed for chest pain. 25 tablet 3  . ranitidine (ZANTAC) 150 MG tablet TAKE 1 TABLET (150 MG TOTAL) BY MOUTH DAILY. 30 tablet 4  . metFORMIN (GLUCOPHAGE) 1000 MG tablet Take 1 tablet (1,000 mg total) by mouth 2 (two) times daily with a meal. (Patient not taking: Reported on 12/05/2016) 90 tablet 3   No current facility-administered medications for this visit.      Allergies:   Patient has no known allergies.   Social History   Social History  . Marital status: Married    Spouse name: N/A  . Number of children: N/A  . Years of education: N/A   Social History Main Topics  . Smoking status: Former Smoker    Years: 25.00    Types: Cigarettes    Quit date: 07/02/1989  .  Smokeless tobacco: Never Used     Comment: "smoked 2-3 cigarettes/week"  . Alcohol use No  . Drug use: No  . Sexual activity: Yes   Other Topics Concern  . None   Social History Narrative  . None     Family History:  Family History  Problem Relation Age of Onset  . Cancer Father        Lung  . Hypertension Mother   . Cancer Mother        Lung  . Diabetes Mother   . Diabetes Maternal Grandmother   . Alzheimer's disease Maternal Grandmother     ROS:   Please see the history of present illness.  All other systems are reviewed and otherwise negative.    PHYSICAL EXAM:   VS:  BP 132/68   Pulse 68   Ht 4' 11.5" (1.511 m)   Wt 193 lb (87.5 kg)   BMI 38.33 kg/m   BMI: Body mass index is 38.33 kg/m. GEN: Well nourished, well developed obese WF, in no acute distress  HEENT: normocephalic, atraumatic Neck: no JVD, carotid bruits, or masses Cardiac: RRR; no murmurs, rubs, or gallops, no edema  Respiratory:  clear to auscultation bilaterally, normal work of breathing GI: soft, nontender, nondistended, + BS MS: no deformity or atrophy  Skin: warm and dry, no rash Neuro:  Alert and Oriented x 3, Strength and sensation are intact, follows commands Psych: euthymic mood, full affect  Wt Readings from Last 3 Encounters:  12/05/16 193 lb (87.5 kg)  04/30/16 178 lb (80.7 kg)  04/25/16 178 lb 14.4 oz (81.1 kg)      Studies/Labs Reviewed:   EKG:  EKG was ordered today and personally reviewed by me and demonstrates NSR 68bpm, nonspecific TW changes - TWI avL, V2-V3 with flattening in V4-V5. Grossly similar to prior.  Recent Labs: 04/12/2016: ALT 11 11/06/2016: BUN 31; Creatinine, Ser 1.22; Hemoglobin 12.8; Platelets 267; Potassium 4.6; Sodium 140   Lipid Panel    Component Value Date/Time   CHOL 148 04/12/2016 0734   TRIG 208 (H) 04/12/2016 0734   HDL 39 (L) 04/12/2016 0734   CHOLHDL 3.8 04/12/2016 0734   VLDL 42 (H) 04/12/2016 0734   LDLCALC 67 04/12/2016 0734     Additional studies/  records that were reviewed today include: Summarized above    ASSESSMENT & PLAN:   1. Atypical chest pain - somewhat challenging situation. In the past, she's had atypical chest pain but in 2017 this prompted stress test, which ultimately resulted in cath/PCI. She did notice residual sharp chest pain after PCI that did not appear to be affected by the intervention. Question whether discomfort is multifactorial. Interestingly despite 1 week of constant pain prior to ED visit, troponins were negative. Given compounding symptoms of epigastric pain, back pain, diarrhea and abnormal lipase, I feel she would benefit from referral to GI. Will refer. We discussed adding back her PPI versus titrating H2; cost-wise she would prefer increasing Zantac to BID. Per ED notes, Dr. Debara Pickett had reviewed case with EDP and recommended to consider repeat stress testing if she had any recurrent symptoms. I will arrange Lexiscan nuclear stress test. We discussed titration of her cardiac regimen but [name] prefers to leave her medication regimen alone otherwise. If nuc is stable, may consider transitioning Imdur to amlodipine given her history of headaches. Of note her chart triggered for extraneous aspirin due to Excedrin migraine with aspirin component. We discussed avoiding this and using the formulation without extra aspirin. She states she rarely has to use this anyway. 2. CAD - continue ASA, Plavix, statin, BB. Plan nuc as above. 3. Chronic combined CHF - appears euvolemic. Clinically though she appears as though she's gained actual body weight. Importance of weight control reinforced. 4. HTN -  BP minimally above goal. Await nuc before adjusting meds. 5. Acute kidney injury - recheck BMET today. Question whether this was due to diarrhea. 6. GERD - titrate Zantac as above. Warning sx reviewed.  Disposition: F/u with me in 6 weeks.  Medication Adjustments/Labs and Tests Ordered: Current  medicines are reviewed at length with the patient today.  Concerns regarding medicines are outlined above. Medication changes, Labs and Tests ordered today are summarized above and listed in the Patient Instructions accessible in Encounters.   Signed, Charlie Pitter, PA-C  12/05/2016 1:40 PM    [facility] [place], [place]  [address] Phone: [phone]; Fax: [phone]

## 2016-12-06 LAB — BASIC METABOLIC PANEL
BUN / CREAT RATIO: 20 (ref 12–28)
BUN: 21 mg/dL (ref 8–27)
CHLORIDE: 97 mmol/L (ref 96–106)
CO2: 23 mmol/L (ref 18–29)
CREATININE: 1.07 mg/dL — AB (ref 0.57–1.00)
Calcium: 9.2 mg/dL (ref 8.7–10.3)
GFR calc Af Amer: 62 mL/min/{1.73_m2} (ref >59)
GFR calc non Af Amer: 54 mL/min/{1.73_m2} — ABNORMAL LOW (ref >59)
Glucose: 270 mg/dL — ABNORMAL HIGH (ref 65–99)
POTASSIUM: 3.5 mmol/L (ref 3.5–5.2)
SODIUM: 139 mmol/L (ref 134–144)

## 2016-12-11 ENCOUNTER — Telehealth: Payer: Self-pay | Admitting: Hematology

## 2016-12-11 ENCOUNTER — Encounter: Payer: Self-pay | Admitting: Gastroenterology

## 2016-12-11 ENCOUNTER — Encounter (INDEPENDENT_AMBULATORY_CARE_PROVIDER_SITE_OTHER): Payer: Self-pay

## 2016-12-11 ENCOUNTER — Ambulatory Visit (INDEPENDENT_AMBULATORY_CARE_PROVIDER_SITE_OTHER): Payer: Medicare Other | Admitting: Gastroenterology

## 2016-12-11 VITALS — BP 130/80 | HR 88 | Ht 59.0 in | Wt 193.0 lb

## 2016-12-11 DIAGNOSIS — R1013 Epigastric pain: Secondary | ICD-10-CM | POA: Diagnosis not present

## 2016-12-11 DIAGNOSIS — R1310 Dysphagia, unspecified: Secondary | ICD-10-CM

## 2016-12-11 MED ORDER — PANTOPRAZOLE SODIUM 40 MG PO TBEC: 40.0000 mg | DELAYED_RELEASE_TABLET | Freq: Every day | ORAL | 5 refills | Status: DC

## 2016-12-11 MED ORDER — OMEPRAZOLE 40 MG PO CPDR: 40.0000 mg | DELAYED_RELEASE_CAPSULE | Freq: Every day | ORAL | 3 refills | Status: DC

## 2016-12-11 NOTE — Telephone Encounter (Signed)
Lvm advising appt chgd from 6/19 + 6/26 to 7/18 + 725 @ 8.15am.

## 2016-12-11 NOTE — Progress Notes (Signed)
HPI: This is a  very pleasant 66 year old woman  who was referred to me by Charlie Pitter, PA-C  to evaluate  back pain, atypical chest pain .    Chief complaint is upper left back pain  She was sent by her cardiologist for atypical chest pains. To me she is really describing upper left back pain.  She has been having upper back pains. This is the location of previous angina located pains.  The pain in her back has been going on for several years.  This can occur at any time. Can also occur with substernal chest pressure and some SOB.  The pain is not daily but close to it.  The pain can last 20-30 min.  She will lay down usually.    Eating does NOT bring on the pain.  She has NO pyrosis.  Mild intemrittent dysphagia.  Overall stable weight.  She had colon and EGD 3-4 years ago; Dr. Oletta Lamas at [place]. She's not sure why she had them done, believes polyps were found.   Old Data Reviewed:   Lap chole 05/2009 for symptomatic gallstones, negative IOC.  Barium esophagram; 2012 ordered by Dr. Oletta Lamas for dysphagia 1.  Slight induced gastroesophageal reflux, very small sliding type hiatus hernia, and slightly slow passage at proximal thoracic esophagus (consistent with mild esophagitis). 2.  Otherwise, negative.  CT scan abd/pelvis 2013 ordered by Dr. Oletta Lamas for chronic nausea:  1.  Stable abdominal lymphadenopathy. 2.  Stool throughout the colon and down into the rectum suggesting constipation. 3.  Stable advanced atherosclerotic changes involving the aorta. Possible FMD involving the left renal artery. 4.  No acute abdominal/pelvic findings.  Review of systems: Pertinent positive and negative review of systems were noted in the above HPI section. All other review negative.   Past Medical History:  Diagnosis Date  . Anemia    "I see dr. Burr Medico @ [place]; I've had transfusion for low iron (04/17/2016)  . Anxiety   . Arthritis    "left hip, fingers"  (04/17/2016)  . CAD  (coronary artery disease)    a. s/p 2007  LAD stent. b. s/p 07/2006 CABG with LIMA-LAD & SVG to Diag. c. 12/2006 diagonal branch w/ 2.5 x 12 mm Endeavor stent. d. s/p 2010 cath with small vessel disease treated with RX, stable by repeat 2015. e. CP/abnl nuc with LHC 04/17/16: new ostial stenosis of LIMA-LAD s/p DES, EF 45%, chronically occ SVG-D2.  Marland Kitchen Chronic combined systolic and diastolic CHF (congestive heart failure) ([place])   . Depression   . GERD (gastroesophageal reflux disease)   . QASTMHDQ(222.9)    "maybe once/month" (04/17/2016)  . Hyperlipidemia   . Hypertension   . Migraines    "couple times/yr"  ((04/17/2016)  . Myocardial infarction [facility]) ?2008  . Obesity   . OSA on CPAP    "suppose to wear mask; can't find it" (10/17//2017)  . Osteopenia 12/2010 &01/21/2013   Minimal of hip DEXA   . Pneumonia    "several years" (04/17/2016)  . Retinopathy 12/08   right  . RLS (restless legs syndrome)   . Thyroid disease    "blood work didn't show it but my dr said he can tell by looking at me; I had radiation" (04/17/2016)  . Type II diabetes mellitus ([place])     Past Surgical History:  Procedure Laterality Date  . CARDIAC CATHETERIZATION  02/03/2008   for CP patent LIMA w mid-vessel spasm.  Marland Kitchen CARDIAC CATHETERIZATION N/A 04/17/2016  Procedure: Left Heart Cath and Cors/Grafts Angiography;  Surgeon: Peter M Martinique, MD;  Location: [place] [facility];  Service: Cardiovascular;  Laterality: N/A;  . CARDIAC CATHETERIZATION N/A 04/17/2016   Procedure: Coronary Stent Intervention;  Surgeon: Peter M Martinique, MD;  Location: [place] CV [facility];  Service: Cardiovascular;  Laterality: N/A;  . COLONOSCOPY  10/2010   rectal polyp  . CORONARY ANGIOPLASTY WITH STENT PLACEMENT     "I've had quite a few stents"   . CORONARY ANGIOPLASTY WITH STENT PLACEMENT  04/17/2016  . CORONARY ARTERY BYPASS GRAFT  ?2008   s/p two vessel CABG off pump LAD diagonal and PTCA/DE Stent mid LAD/ DE stent LAD diagonal through  previous stent sstruts,occluded SVG-diagonal  . LAPAROSCOPIC CHOLECYSTECTOMY  1990's  . LEFT HEART CATHETERIZATION WITH CORONARY ANGIOGRAM N/A 07/15/2013   Procedure: LEFT HEART CATHETERIZATION WITH CORONARY ANGIOGRAM;  Surgeon: Peter M Martinique, MD;  [facility] CATH LAB;  Service: Cardiovascular;  Laterality: N/A;  . TUBAL LIGATION  1976    Current Outpatient Prescriptions  Medication Sig Dispense Refill  . acetaminophen (TYLENOL) 500 MG tablet Take 2 tablets (1,000 mg total) by mouth every 6 (six) hours as needed for moderate pain or headache. (Patient taking differently: Take 1,000 mg by mouth every 6 (six) hours as needed (for headaches, migraines, or pain). )    . aspirin EC 81 MG tablet Take 1 tablet (81 mg total) by mouth daily. 30 tablet 11  . atorvastatin (LIPITOR) 80 MG tablet Take 1 tablet (80 mg total) by mouth every evening. 90 tablet 3  . clopidogrel (PLAVIX) 75 MG tablet Take 1 tablet (75 mg total) by mouth daily. 30 tablet 11  . escitalopram (LEXAPRO) 10 MG tablet Take 1 tablet (10 mg total) by mouth daily. (Patient taking differently: Take 10 mg by mouth every morning. ) 90 tablet 3  . ferrous sulfate 325 (65 FE) MG EC tablet Take 1 tablet (325 mg total) by mouth daily with breakfast. 90 tablet 3  . hydrochlorothiazide (HYDRODIURIL) 25 MG tablet Take 1 tablet (25 mg total) by mouth daily. (Patient taking differently: Take 25 mg by mouth every morning. ) 90 tablet 3  . isosorbide mononitrate (IMDUR) 60 MG 24 hr tablet Take 1 tablet (60 mg total) by mouth daily. (Patient taking differently: Take 60 mg by mouth every morning. ) 90 tablet 3  . lisinopril (PRINIVIL,ZESTRIL) 5 MG tablet TAKE 1 TABLET BY MOUTH EVERY DAY 30 tablet 7  . metFORMIN (GLUCOPHAGE) 1000 MG tablet Take 1 tablet (1,000 mg total) by mouth 2 (two) times daily with a meal. 90 tablet 3  . metoprolol tartrate (LOPRESSOR) 25 MG tablet Take 1 tablet (25 mg total) by mouth 2 (two) times daily. 180 tablet 3  .  nitroGLYCERIN (NITROSTAT) 0.4 MG SL tablet Place 1 tablet (0.4 mg total) under the tongue every 5 (five) minutes as needed for chest pain. 25 tablet 3  . ranitidine (ZANTAC) 150 MG tablet Take 1 tablet (150 mg total) by mouth 2 (two) times daily. 60 tablet 2   No current facility-administered medications for this visit.     Allergies as of 12/11/2016  . (No Known Allergies)    Family History  Problem Relation Age of Onset  . Cancer Father        Lung  . Hypertension Mother   . Cancer Mother        Lung  . Diabetes Mother   . Diabetes Maternal Grandmother   . Alzheimer's disease  Maternal Grandmother     Social History   Social History  . Marital status: Married    Spouse name: N/A  . Number of children: N/A  . Years of education: N/A   Occupational History  . Not on file.   Social History Main Topics  . Smoking status: Former Smoker    Years: 25.00    Types: Cigarettes    Quit date: 07/02/1989  . Smokeless tobacco: Never Used     Comment: "smoked 2-3 cigarettes/week"  . Alcohol use No  . Drug use: No  . Sexual activity: Yes   Other Topics Concern  . Not on file   Social History Narrative  . No narrative on file     Physical Exam: BP 130/80   Pulse 88   Ht 4\' 11"  (1.499 m)   Wt 193 lb (87.5 kg)   BMI 38.98 kg/m  Constitutional: generally well-appearing Psychiatric: alert and oriented x3 Eyes: extraocular movements intact Mouth: oral pharynx moist, no lesions Neck: supple no lymphadenopathy Cardiovascular: heart regular rate and rhythm Lungs: clear to auscultation bilaterally Abdomen: soft, nontender, nondistended, no obvious ascites, no peritoneal signs, normal bowel sounds Extremities: no lower extremity edema bilaterally Skin: no lesions on visible extremities   Assessment and plan: 66 y.o. female with  Upper left back pain, "atypical chest pain"  She is getting cardiac stress testing in two weeks and given her complex CAD history I don't think  she should have any invasive testing prior to then. Her main complaint, left upper back pain, would be very unusual for a GI related issue. Perhaps this is her GERD equivalent. Although that would be an unusual symptom as well I think it is an easy test to try her on proton pump inhibitor once daily and see if this back pain improves. She does have mild intermittent chronic nonprogressive solid food dysphagia and I recommended barium esophagram for further evaluation.  She has seen gastroenterology in the past, Dr. Oletta Lamas at [place] and we will get his records sent here for review. Specifically I'm hoping to see previous upper endoscopy, colonoscopy report from 3-4 years ago. She does not recall why she ever had those tests done but she thinks polyps were found.  She will not start the Zantac since I"m trying her on PPI instead.    Please see the "Patient Instructions" section for addition details about the plan.   Owens Loffler, MD [facility] 12/11/2016, 10:43 AM  Cc: Charlie Pitter, PA-C

## 2016-12-11 NOTE — Addendum Note (Signed)
Addended by: Jeoffrey Massed on: 12/11/2016 01:17 PM   Modules accepted: Orders

## 2016-12-11 NOTE — Patient Instructions (Addendum)
We will get records sent from your previous gastroenterologist at [facility] for review.  This will include any endoscopic (colonoscopy or upper endoscopy) procedures and any associated pathology reports.    Do not take zantac.  Instead take omeprazole 40mg  pill, take one pill 20-30 min before breakfast meal daily.  Disp 30 pills, 5 refills.  Barium esophagram for dysphagia.  You have been scheduled for a Barium Esophogram at [facility]iology (1st floor of the hospital) on 12/21/16 at 11 am. Please arrive 15 minutes prior to your appointment for registration. Make certain not to have anything to eat or drink 6 hours prior to your test. If you need to reschedule for any reason, please contact radiology at [phone] to do so. ________________________________________________________ A barium swallow is an examination that concentrates on views of the esophagus. This tends to be a double contrast exam (barium and two liquids which, when combined, create a gas to distend the wall of the oesophagus) or single contrast (non-ionic iodine based). The study is usually tailored to your symptoms so a good history is essential. Attention is paid during the study to the form, structure and configuration of the esophagus, looking for functional disorders (such as aspiration, dysphagia, achalasia, motility and reflux) EXAMINATION You may be asked to change into a gown, depending on the type of swallow being performed. A radiologist and radiographer will perform the procedure. The radiologist will advise you of the type of contrast selected for your procedure and direct you during the exam. You will be asked to stand, sit or lie in several different positions and to hold a small amount of fluid in your mouth before being asked to swallow while the imaging is performed .In some instances you may be asked to swallow barium coated marshmallows to assess the motility of a solid food bolus. The exam can be recorded as  a digital or video fluoroscopy procedure. POST PROCEDURE It will take 1-2 days for the barium to pass through your system. To facilitate this, it is important, unless otherwise directed, to increase your fluids for the next 24-48hrs and to resume your normal diet.  This test typically takes about 30 minutes to perform. ________________________________________________________  Continue with plans for cardiac stress test in two weeks from now.

## 2016-12-18 ENCOUNTER — Other Ambulatory Visit: Payer: No Typology Code available for payment source

## 2016-12-21 ENCOUNTER — Ambulatory Visit (HOSPITAL_COMMUNITY)
Admission: RE | Admit: 2016-12-21 | Discharge: 2016-12-21 | Disposition: A | Discharge status: Home / Self Care | Payer: Medicare Other | Source: Ambulatory Visit | Attending: Gastroenterology | Admitting: Gastroenterology

## 2016-12-21 DIAGNOSIS — R1013 Epigastric pain: Secondary | ICD-10-CM | POA: Diagnosis not present

## 2016-12-21 DIAGNOSIS — R1310 Dysphagia, unspecified: Secondary | ICD-10-CM | POA: Diagnosis not present

## 2016-12-24 ENCOUNTER — Telehealth (HOSPITAL_COMMUNITY): Payer: Self-pay | Admitting: *Deleted

## 2016-12-24 NOTE — Telephone Encounter (Signed)
Patient given detailed instructions per Myocardial Perfusion Study Information Sheet for the test on 12/26/16 at 1230. Patient notified to arrive 15 minutes early and that it is imperative to arrive on time for appointment to keep from having the test rescheduled.  If you need to cancel or reschedule your appointment, please call the office within 24 hours of your appointment. . Patient verbalized understanding.Reyan Helle, Ranae Palms

## 2016-12-25 ENCOUNTER — Ambulatory Visit: Payer: No Typology Code available for payment source | Admitting: Hematology

## 2016-12-26 ENCOUNTER — Ambulatory Visit (HOSPITAL_COMMUNITY): Payer: Medicare Other | Attending: Cardiovascular Disease

## 2016-12-26 DIAGNOSIS — R0789 Other chest pain: Secondary | ICD-10-CM | POA: Diagnosis not present

## 2016-12-26 DIAGNOSIS — R9439 Abnormal result of other cardiovascular function study: Secondary | ICD-10-CM | POA: Insufficient documentation

## 2016-12-26 MED ORDER — REGADENOSON 0.4 MG/5ML IV SOLN
0.4000 mg | Freq: Once | INTRAVENOUS | Status: AC
  Administered 2016-12-26: 0.4 mg via INTRAVENOUS

## 2016-12-26 MED ORDER — TECHNETIUM TC 99M TETROFOSMIN IV KIT
32.8000 | PACK | Freq: Once | INTRAVENOUS | Status: AC | PRN
  Administered 2016-12-26: 32.8 via INTRAVENOUS
  Filled 2016-12-26: qty 33

## 2016-12-27 ENCOUNTER — Ambulatory Visit (HOSPITAL_COMMUNITY): Payer: Medicare Other | Attending: Cardiology

## 2016-12-27 ENCOUNTER — Ambulatory Visit (HOSPITAL_COMMUNITY): Payer: Medicare Other

## 2016-12-27 LAB — MYOCARDIAL PERFUSION IMAGING
CHL CUP NUCLEAR SRS: 5
CHL CUP RESTING HR STRESS: 73 {beats}/min
LV dias vol: 79 mL (ref 46–106)
LVSYSVOL: 43 mL
Peak HR: 93 {beats}/min
RATE: 0.31
SDS: 5
SSS: 10
TID: 0.9

## 2016-12-27 MED ORDER — TECHNETIUM TC 99M TETROFOSMIN IV KIT
32.7000 | PACK | Freq: Once | INTRAVENOUS | Status: AC | PRN
  Administered 2016-12-27: 32.7 via INTRAVENOUS
  Filled 2016-12-27: qty 33

## 2016-12-28 ENCOUNTER — Other Ambulatory Visit: Payer: Medicare Other | Admitting: *Deleted

## 2016-12-28 ENCOUNTER — Telehealth: Payer: Self-pay | Admitting: Physician Assistant

## 2016-12-28 DIAGNOSIS — I208 Other forms of angina pectoris: Secondary | ICD-10-CM | POA: Diagnosis not present

## 2016-12-28 DIAGNOSIS — R9439 Abnormal result of other cardiovascular function study: Secondary | ICD-10-CM

## 2016-12-28 DIAGNOSIS — R079 Chest pain, unspecified: Secondary | ICD-10-CM

## 2016-12-28 NOTE — Telephone Encounter (Signed)
Called pt and she will come to the office this afternoon, before we close, for her lab work for her heart cath that is set up for Monday, 12/31/16 @ 3:00 with Dr. Martinique  Pt will ask for me at the front desk so we can go over her cath instructions below:  @LOGO @  [facility] [address] [place] [address] [place] [address] Dept: [phone] Loc: [place]  12/28/2016  You are scheduled for a Cardiac Catheterization on Monday, July 2 with Dr. Peter Martinique.  1. Please arrive at the [facility] (Main Entrance A) at [facility]: [address] [place], [place] [address] at 1:00 PM (two hours before your procedure to ensure your preparation). Free valet parking service is available.   Special note: Every effort is made to have your procedure done on time. Please understand that emergencies sometimes delay scheduled procedures.  2. Diet: Do not eat or drink anything after midnight prior to your procedure except sips of water to take medications.  3. Labs: You will need to have blood drawn on Friday, June 29 at [facility] [address]  Open: 7:30am - 5pm    Phone: [phone]. You do not need to be fasting.  4. Medication instructions in preparation for your procedure:   TAKE THE METFORMIN MEDICATION EARLIER THE NIGHT BEFORE AND DO NOT TAKE IT THE MORNING OF YOUR PROCEDURE   On the morning of your procedure, take your Aspirin and any morning medicines NOT listed above.  You may use sips of water.  5. Plan for one night stay--bring personal belongings. 6. Bring a current list of your medications and current insurance cards. 7. You MUST have a responsible person to drive you home. 8. Someone MUST be with you the first 24 hours after you arrive home or your discharge will be delayed. 9. Please wear clothes that are easy to get on and  off and wear slip-on shoes.  Thank you for allowing [place] to care for you!   -- [facility]

## 2016-12-28 NOTE — Telephone Encounter (Signed)
See result note on stress test - abnormal. Dr. Radford Pax recommends cath. Spoke with patient. Continues with DOE, also has had 4-5 more brief episodes of CP since the last time we spoke. Risks and benefits of cardiac catheterization have been discussed with the patient.  These include bleeding, infection, kidney damage, stroke, heart attack, death.  The patient understands these risks and is willing to proceed. She is familiar with procedure from several prior caths. She has been compliant with meds.  Anderson Malta, can you please arrange pre-cath labs ASAP? BMET, CBC, PT/INR. If she is able to get them done today, would plan cath on Monday. If she cannot come til Monday, would plan labs Monday and cath Tuesday. Trying to get cath ASAP so that my OV from 6/6 can still be used as her up to date H/P within last 30 days. Money is tight for Ms. Verret so I tried to save her an office visit and instead spoke at length with her over the phone. Dayna Dunn PA-C

## 2016-12-28 NOTE — Addendum Note (Signed)
Addended by: Eulis Foster on: 12/28/2016 04:24 PM   Modules accepted: Orders

## 2016-12-29 LAB — BASIC METABOLIC PANEL
BUN / CREAT RATIO: 22 (ref 12–28)
BUN: 24 mg/dL (ref 8–27)
CHLORIDE: 97 mmol/L (ref 96–106)
CO2: 21 mmol/L (ref 20–29)
Calcium: 9.3 mg/dL (ref 8.7–10.3)
Creatinine, Ser: 1.11 mg/dL — ABNORMAL HIGH (ref 0.57–1.00)
GFR calc non Af Amer: 52 mL/min/{1.73_m2} — ABNORMAL LOW (ref >59)
GFR, EST AFRICAN AMERICAN: 59 mL/min/{1.73_m2} — AB (ref >59)
GLUCOSE: 497 mg/dL — AB (ref 65–99)
POTASSIUM: 4.6 mmol/L (ref 3.5–5.2)
SODIUM: 136 mmol/L (ref 134–144)

## 2016-12-29 LAB — CBC WITH DIFFERENTIAL/PLATELET
BASOS ABS: 0 10*3/uL (ref 0.0–0.2)
Basos: 0 %
EOS (ABSOLUTE): 0.2 10*3/uL (ref 0.0–0.4)
Eos: 2 %
HEMOGLOBIN: 12.7 g/dL (ref 11.1–15.9)
Hematocrit: 39 % (ref 34.0–46.6)
Immature Grans (Abs): 0 10*3/uL (ref 0.0–0.1)
Immature Granulocytes: 0 %
LYMPHS ABS: 2.2 10*3/uL (ref 0.7–3.1)
Lymphs: 27 %
MCH: 31.4 pg (ref 26.6–33.0)
MCHC: 32.6 g/dL (ref 31.5–35.7)
MCV: 96 fL (ref 79–97)
MONOS ABS: 0.3 10*3/uL (ref 0.1–0.9)
Monocytes: 4 %
NEUTROS ABS: 5.6 10*3/uL (ref 1.4–7.0)
Neutrophils: 67 %
PLATELETS: 220 10*3/uL (ref 150–379)
RBC: 4.05 x10E6/uL (ref 3.77–5.28)
RDW: 12.9 % (ref 12.3–15.4)
WBC: 8.3 10*3/uL (ref 3.4–10.8)

## 2016-12-29 LAB — PROTIME-INR
INR: 0.9 (ref 0.8–1.2)
Prothrombin Time: 9.7 s (ref 9.1–12.0)

## 2016-12-31 ENCOUNTER — Ambulatory Visit (HOSPITAL_COMMUNITY)
Admission: RE | Admit: 2016-12-31 | Discharge: 2016-12-31 | Disposition: A | Discharge status: Home / Self Care | Payer: Medicare Other | Source: Ambulatory Visit | Attending: Cardiology | Admitting: Cardiology

## 2016-12-31 ENCOUNTER — Encounter (HOSPITAL_COMMUNITY)
Admission: RE | Disposition: A | Discharge status: Home / Self Care | Payer: Self-pay | Source: Ambulatory Visit | Attending: Cardiology

## 2016-12-31 DIAGNOSIS — I110 Hypertensive heart disease with heart failure: Secondary | ICD-10-CM | POA: Insufficient documentation

## 2016-12-31 DIAGNOSIS — E669 Obesity, unspecified: Secondary | ICD-10-CM | POA: Insufficient documentation

## 2016-12-31 DIAGNOSIS — Z6838 Body mass index (BMI) 38.0-38.9, adult: Secondary | ICD-10-CM | POA: Diagnosis not present

## 2016-12-31 DIAGNOSIS — R9439 Abnormal result of other cardiovascular function study: Secondary | ICD-10-CM | POA: Diagnosis not present

## 2016-12-31 DIAGNOSIS — Z7982 Long term (current) use of aspirin: Secondary | ICD-10-CM | POA: Insufficient documentation

## 2016-12-31 DIAGNOSIS — F329 Major depressive disorder, single episode, unspecified: Secondary | ICD-10-CM | POA: Insufficient documentation

## 2016-12-31 DIAGNOSIS — Z87891 Personal history of nicotine dependence: Secondary | ICD-10-CM | POA: Insufficient documentation

## 2016-12-31 DIAGNOSIS — I5042 Chronic combined systolic (congestive) and diastolic (congestive) heart failure: Secondary | ICD-10-CM | POA: Insufficient documentation

## 2016-12-31 DIAGNOSIS — I2510 Atherosclerotic heart disease of native coronary artery without angina pectoris: Secondary | ICD-10-CM | POA: Diagnosis present

## 2016-12-31 DIAGNOSIS — N179 Acute kidney failure, unspecified: Secondary | ICD-10-CM | POA: Diagnosis not present

## 2016-12-31 DIAGNOSIS — I25810 Atherosclerosis of coronary artery bypass graft(s) without angina pectoris: Secondary | ICD-10-CM | POA: Insufficient documentation

## 2016-12-31 DIAGNOSIS — R0789 Other chest pain: Secondary | ICD-10-CM | POA: Diagnosis present

## 2016-12-31 DIAGNOSIS — E0500 Thyrotoxicosis with diffuse goiter without thyrotoxic crisis or storm: Secondary | ICD-10-CM | POA: Diagnosis not present

## 2016-12-31 DIAGNOSIS — K219 Gastro-esophageal reflux disease without esophagitis: Secondary | ICD-10-CM | POA: Diagnosis not present

## 2016-12-31 DIAGNOSIS — I10 Essential (primary) hypertension: Secondary | ICD-10-CM | POA: Diagnosis present

## 2016-12-31 DIAGNOSIS — Z79899 Other long term (current) drug therapy: Secondary | ICD-10-CM | POA: Diagnosis not present

## 2016-12-31 DIAGNOSIS — I209 Angina pectoris, unspecified: Secondary | ICD-10-CM | POA: Diagnosis present

## 2016-12-31 DIAGNOSIS — E785 Hyperlipidemia, unspecified: Secondary | ICD-10-CM | POA: Diagnosis present

## 2016-12-31 DIAGNOSIS — E119 Type 2 diabetes mellitus without complications: Secondary | ICD-10-CM

## 2016-12-31 DIAGNOSIS — Z951 Presence of aortocoronary bypass graft: Secondary | ICD-10-CM | POA: Diagnosis not present

## 2016-12-31 HISTORY — PX: LEFT HEART CATH AND CORS/GRAFTS ANGIOGRAPHY: CATH118250

## 2016-12-31 LAB — GLUCOSE, CAPILLARY
GLUCOSE-CAPILLARY: 228 mg/dL — AB (ref 65–99)
Glucose-Capillary: 185 mg/dL — ABNORMAL HIGH (ref 65–99)

## 2016-12-31 SURGERY — LEFT HEART CATH AND CORS/GRAFTS ANGIOGRAPHY
Anesthesia: LOCAL

## 2016-12-31 MED ORDER — HEPARIN (PORCINE) IN NACL 2-0.9 UNIT/ML-% IJ SOLN
INTRAMUSCULAR | Status: AC
  Filled 2016-12-31: qty 1000

## 2016-12-31 MED ORDER — SODIUM CHLORIDE 0.9% FLUSH: 3.0000 mL | Freq: Two times a day (BID) | INTRAVENOUS | Status: DC

## 2016-12-31 MED ORDER — FENTANYL CITRATE (PF) 100 MCG/2ML IJ SOLN
INTRAMUSCULAR | Status: DC | PRN
  Administered 2016-12-31: 25 ug via INTRAVENOUS

## 2016-12-31 MED ORDER — SODIUM CHLORIDE 0.9% FLUSH: 3.0000 mL | INTRAVENOUS | Status: DC | PRN

## 2016-12-31 MED ORDER — IOPAMIDOL (ISOVUE-370) INJECTION 76%
INTRAVENOUS | Status: AC
  Filled 2016-12-31: qty 100

## 2016-12-31 MED ORDER — SODIUM CHLORIDE 0.9 % WEIGHT BASED INFUSION: 1.0000 mL/kg/h | INTRAVENOUS | Status: DC

## 2016-12-31 MED ORDER — FENTANYL CITRATE (PF) 100 MCG/2ML IJ SOLN
INTRAMUSCULAR | Status: AC
  Filled 2016-12-31: qty 2

## 2016-12-31 MED ORDER — SODIUM CHLORIDE 0.9 % WEIGHT BASED INFUSION
3.0000 mL/kg/h | INTRAVENOUS | Status: AC
  Administered 2016-12-31: 3 mL/kg/h via INTRAVENOUS

## 2016-12-31 MED ORDER — CLOPIDOGREL BISULFATE 75 MG PO TABS: 75.0000 mg | ORAL_TABLET | ORAL | Status: DC

## 2016-12-31 MED ORDER — METFORMIN HCL 500 MG PO TABS: 1000.0000 mg | ORAL_TABLET | Freq: Two times a day (BID) | ORAL | Status: DC

## 2016-12-31 MED ORDER — SODIUM CHLORIDE 0.9 % IV SOLN: 250.0000 mL | INTRAVENOUS | Status: DC | PRN

## 2016-12-31 MED ORDER — IOPAMIDOL (ISOVUE-370) INJECTION 76%
INTRAVENOUS | Status: DC | PRN
  Administered 2016-12-31: 80 mL via INTRA_ARTERIAL

## 2016-12-31 MED ORDER — MIDAZOLAM HCL 2 MG/2ML IJ SOLN
INTRAMUSCULAR | Status: AC
Start: 2016-12-31 — End: 2016-12-31
  Filled 2016-12-31: qty 2

## 2016-12-31 MED ORDER — LIDOCAINE HCL 1 % IJ SOLN
INTRAMUSCULAR | Status: AC
  Filled 2016-12-31: qty 20

## 2016-12-31 MED ORDER — ASPIRIN 81 MG PO CHEW: 81.0000 mg | CHEWABLE_TABLET | ORAL | Status: DC

## 2016-12-31 MED ORDER — MIDAZOLAM HCL 2 MG/2ML IJ SOLN
INTRAMUSCULAR | Status: DC | PRN
  Administered 2016-12-31: 1 mg via INTRAVENOUS

## 2016-12-31 MED ORDER — HEPARIN (PORCINE) IN NACL 2-0.9 UNIT/ML-% IJ SOLN
INTRAMUSCULAR | Status: AC | PRN
  Administered 2016-12-31: 1000 mL

## 2016-12-31 MED ORDER — SODIUM CHLORIDE 0.9 % WEIGHT BASED INFUSION: 1.0000 mL/kg/h | INTRAVENOUS | Status: AC

## 2016-12-31 MED ORDER — LIDOCAINE HCL (PF) 1 % IJ SOLN
INTRAMUSCULAR | Status: DC | PRN
Start: 2016-12-31 — End: 2016-12-31
  Administered 2016-12-31: 15 mL

## 2016-12-31 SURGICAL SUPPLY — 7 items
CATH INFINITI 5FR MULTPACK ANG (CATHETERS) ×1 IMPLANT
KIT HEART LEFT (KITS) ×2 IMPLANT
PACK CARDIAC CATHETERIZATION (CUSTOM PROCEDURE TRAY) ×2 IMPLANT
SHEATH PINNACLE 5F 10CM (SHEATH) ×1 IMPLANT
SYR MEDRAD MARK V 150ML (SYRINGE) ×2 IMPLANT
TRANSDUCER W/STOPCOCK (MISCELLANEOUS) ×2 IMPLANT
WIRE EMERALD 3MM-J .035X150CM (WIRE) ×1 IMPLANT

## 2016-12-31 NOTE — Interval H&P Note (Signed)
History and Physical Interval Note:  12/31/2016 3:29 PM  [name]  has presented today for surgery, with the diagnosis of abnormal stress - cp  The various methods of treatment have been discussed with the patient and family. After consideration of risks, benefits and other options for treatment, the patient has consented to  Procedure(s): Left Heart Cath and Cors/Grafts Angiography (N/A) as a surgical intervention .  The patient's history has been reviewed, patient examined, no change in status, stable for surgery.  I have reviewed the patient's chart and labs.  Questions were answered to the patient's satisfaction.    Cath Lab Visit (complete for each Cath Lab visit)  Clinical Evaluation Leading to the Procedure:   ACS: No.  Non-ACS:    Anginal Classification: CCS II  Anti-ischemic medical therapy: Maximal Therapy (2 or more classes of medications)  Non-Invasive Test Results: Intermediate-risk stress test findings: cardiac mortality 1-3%/year  Prior CABG: Previous CABG       [name] [facility] 12/31/2016 3:30 PM

## 2016-12-31 NOTE — Progress Notes (Signed)
Site area: Right groin a 5 french arterial sheath was removed  Site Prior to Removal:  Level 0  Pressure Applied For 20 MINUTES    Bedrest  Beginning at 1650p  Manual:   Yes.    Patient Status During Pull:  stable  Post Pull Groin Site:  Level 0  Post Pull Instructions Given:  Yes.    Post Pull Pulses Present:  Yes.    Dressing Applied:  Yes.    Comments:  VS remain stable during sheath pull

## 2016-12-31 NOTE — Discharge Instructions (Signed)
Excuse from Work, [employer], or Physical Activity ____________________[name]___________________________________ needs to be excused from: ___X_ Work ____ [employer] ____ Physical activity beginning now and through the following date: ____07/2/18 TO 7/03/18____________. Health Care Provider Name (printed): [facility] [name],RN_______________________________________ Health Care Provider (signature): ___________________________________________ Date: ______0702/18__________                      NO METFORMIN/GLUCOPHAGE FOR 2 DAYS    Femoral Site Care Refer to this sheet in the next few weeks. These instructions provide you with information about caring for yourself after your procedure. Your health care provider may also give you more specific instructions. Your treatment has been planned according to current medical practices, but problems sometimes occur. Call your health care provider if you have any problems or questions after your procedure. What can I expect after the procedure? After your procedure, it is typical to have the following:  Bruising at the site that usually fades within 1-2 weeks.  Blood collecting in the tissue (hematoma) that may be painful to the touch. It should usually decrease in size and tenderness within 1-2 weeks.  Follow these instructions at home:  Take medicines only as directed by your health care provider.  You may shower 24-48 hours after the procedure or as directed by your health care provider. Remove the bandage (dressing) and gently wash the site with plain soap and water. Pat the area dry with a clean towel. Do not rub the site, because this may cause bleeding.  Do not take baths, swim, or use a hot tub until your health care provider approves.  Check your insertion site every day for redness, swelling, or drainage.  Do not apply powder or lotion to the site.  Limit use of stairs to twice a day for the first 2-3 days or as  directed by your health care provider.  Do not squat for the first 2-3 days or as directed by your health care provider.  Do not lift over 10 lb (4.5 kg) for 5 days after your procedure or as directed by your health care provider.  Ask your health care provider when it is okay to: ? Return to work or school. ? Resume usual physical activities or sports. ? Resume sexual activity.  Do not drive home if you are discharged the same day as the procedure. Have someone else drive you.  You may drive 24 hours after the procedure unless otherwise instructed by your health care provider.  Do not operate machinery or power tools for 24 hours after the procedure or as directed by your health care provider.  If your procedure was done as an outpatient procedure, which means that you went home the same day as your procedure, a responsible adult should be with you for the first 24 hours after you arrive home.  Keep all follow-up visits as directed by your health care provider. This is important. Contact a health care provider if:  You have a fever.  You have chills.  You have increased bleeding from the site. Hold pressure on the site. Get help right away if:  You have unusual pain at the site.  You have redness, warmth, or swelling at the site.  You have drainage (other than a small amount of blood on the dressing) from the site.  The site is bleeding, and the bleeding does not stop after 30 minutes of holding steady pressure on the site.  Your leg or foot becomes pale, cool, tingly, or numb.  This information is not intended to replace advice given to you by your health care provider. Make sure you discuss any questions you have with your health care provider. Document Released: 02/19/2014 Document Revised: 11/24/2015 Document Reviewed: 01/05/2014 Elsevier Interactive Patient Education  [name].

## 2016-12-31 NOTE — H&P (View-Only) (Signed)
Cardiology Office Note    Date:  12/05/2016  ID:  [name], DOB [birth year], MRN [id number] PCP:  Carol Ada, MD  Cardiologist:  Dr. Radford Pax   Chief Complaint: chest pain  History of Present Illness:  [name] is a 66 y.o. female with history of CAD (LAD stent 2007, CABG in 07/2006, Endeavor stent to diagonal 12/2006, cath 2010 with small vessel dz, stable cath 07/2013, s/p DES to the LIMA-LAD in 04/2016), depression, GERD, Graves disease, HTN, migraines, iron deficiency anemia (followed by heme), arthritis, obesity, retinopathy, DM, RLS, chronic LE (at end of the day), chronic combined CHF who presents for post-ER follow-up.  Prior echo 12/2014 showed EF 40-45%, grade 1 DD, mild LAE. She has been on medical therapy but had run out of medications for several months as she owed a bill to PCP and could not be seen until she paid it. She saw Dr. Radford Pax 03/19/16 at which time she was reporting some chest heaviness, different than prior angina, nonexertional. It was felt possibly related to her uncontrolled BP. She was also reporting chronic dependent LEE at the end of the day and chronic dizziness with balance. Beta blocker, diuretic, statin, metformin were restarted and Lovaza was added. At f/u 04/2016 she was  still reporting CP with mixed typical/atypical feature prompting nuclear stress test which was abnormal. Cardiac cath was performed 04/17/16 showing new ostial stenosis at the origin of the LIMA-LAD, known chronic occlusion of SVG-diagonal, EF 45%. She received a 2.5X12 Promus drug eluting stent to the LIMA-LAD. DAPT x 1 year was recommended.She is also followed in the cancer center for iron deficiency anemia. At f/u 04/2016 after cath she was reporting some atypical discomfort described as a sharp stab of chest pain, different than recent angina; monitoring for progressive symptoms recommended.  She was seen in the ED 10/2016 with constant midsternal chest pain x 1 week, described as  pressure with occasional sharpness, with back pain as well, and bruising to her left shoulder. She also had epigastric pain and watery nonbloody diarrhea as well as left-sided headache. She was treated for migraine. EKG and CXR unremarkable as well as troponin x2. Lipase was elevated at 63. BMET showed K 4.6, BUN 31, Cr 1.22, normal CBC. EDP spoke with cardiology who recommended OP f/u. She returns today for follow-up. She's had about 4 more episodes of the above since ED visit, lasting 10-30 minutes where she develops sensation of central chest pressure, back discomfort, headache beginning at the base of her neck. It is not provoked by anything and happens during rest.NTG gives no relief. She goes and lays down when it happens with improvement. She has not noticed any exertional component but does not exercise. She is no longer on Protonix but not sure why.    Past Medical History:  Diagnosis Date  . Anemia    "I see dr. Burr Medico @ [facility]; I've had transfusion for low iron (04/17/2016)  . Anxiety   . Arthritis    "left hip, fingers"  (04/17/2016)  . CAD (coronary artery disease)    a. s/p 2007  LAD stent. b. s/p 07/2006 CABG with LIMA-LAD & SVG to Diag. c. 12/2006 diagonal branch w/ 2.5 x 12 mm Endeavor stent. d. s/p 2010 cath with small vessel disease treated with RX, stable by repeat 2015. e. CP/abnl nuc with LHC 04/17/16: new ostial stenosis of LIMA-LAD s/p DES, EF 45%, chronically occ SVG-D2.  Marland Kitchen Chronic combined systolic and diastolic CHF (congestive  heart failure) ([place])   . Depression   . GERD (gastroesophageal reflux disease)   . XVQMGQQP(619.5)    "maybe once/month" (04/17/2016)  . Hyperlipidemia   . Hypertension   . Migraines    "couple times/yr"  ((04/17/2016)  . Myocardial infarction [facility]) ?2008  . Obesity   . OSA on CPAP    "suppose to wear mask; can't find it" (10/17//2017)  . Osteopenia 12/2010 &01/21/2013   Minimal of hip DEXA   . Pneumonia    "several years" (04/17/2016)    . Retinopathy 12/08   right  . RLS (restless legs syndrome)   . Thyroid disease    "blood work didn't show it but my dr said he can tell by looking at me; I had radiation" (04/17/2016)  . Type II diabetes mellitus ([place])     Past Surgical History:  Procedure Laterality Date  . CARDIAC CATHETERIZATION  02/03/2008   for CP patent LIMA w mid-vessel spasm.  Marland Kitchen CARDIAC CATHETERIZATION N/A 04/17/2016   Procedure: Left Heart Cath and Cors/Grafts Angiography;  Surgeon: Peter M Martinique, MD;  Location: [facility];  Service: Cardiovascular;  Laterality: N/A;  . CARDIAC CATHETERIZATION N/A 04/17/2016   Procedure: Coronary Stent Intervention;  Surgeon: Peter M Martinique, MD;  Location: [facility];  Service: Cardiovascular;  Laterality: N/A;  . COLONOSCOPY  10/2010   rectal polyp  . CORONARY ANGIOPLASTY WITH STENT PLACEMENT     "I've had quite a few stents"   . CORONARY ANGIOPLASTY WITH STENT PLACEMENT  04/17/2016  . CORONARY ARTERY BYPASS GRAFT  ?2008   s/p two vessel CABG off pump LAD diagonal and PTCA/DE Stent mid LAD/ DE stent LAD diagonal through previous stent sstruts,occluded SVG-diagonal  . LAPAROSCOPIC CHOLECYSTECTOMY  1990's  . LEFT HEART CATHETERIZATION WITH CORONARY ANGIOGRAM N/A 07/15/2013   Procedure: LEFT HEART CATHETERIZATION WITH CORONARY ANGIOGRAM;  Surgeon: Peter M Martinique, MD;  Location: [facility] CATH LAB;  Service: Cardiovascular;  Laterality: N/A;  . TUBAL LIGATION  1976    Current Medications: Current Outpatient Prescriptions  Medication Sig Dispense Refill  . acetaminophen (TYLENOL) 500 MG tablet Take 2 tablets (1,000 mg total) by mouth every 6 (six) hours as needed for moderate pain or headache. (Patient taking differently: Take 1,000 mg by mouth every 6 (six) hours as needed (for headaches, migraines, or pain). )    . aspirin EC 81 MG tablet Take 1 tablet (81 mg total) by mouth daily. 30 tablet 11  . aspirin-acetaminophen-caffeine (EXCEDRIN MIGRAINE) 093-267-12 MG  tablet Take 1-2 tablets by mouth every 6 (six) hours as needed for headache or migraine.     Marland Kitchen atorvastatin (LIPITOR) 80 MG tablet Take 1 tablet (80 mg total) by mouth every evening. 90 tablet 3  . clopidogrel (PLAVIX) 75 MG tablet Take 1 tablet (75 mg total) by mouth daily. 30 tablet 11  . escitalopram (LEXAPRO) 10 MG tablet Take 1 tablet (10 mg total) by mouth daily. (Patient taking differently: Take 10 mg by mouth every morning. ) 90 tablet 3  . ferrous sulfate 325 (65 FE) MG EC tablet Take 1 tablet (325 mg total) by mouth daily with breakfast. 90 tablet 3  . hydrochlorothiazide (HYDRODIURIL) 25 MG tablet Take 1 tablet (25 mg total) by mouth daily. (Patient taking differently: Take 25 mg by mouth every morning. ) 90 tablet 3  . isosorbide mononitrate (IMDUR) 60 MG 24 hr tablet Take 1 tablet (60 mg total) by mouth daily. (Patient taking differently: Take 60 mg by  mouth every morning. ) 90 tablet 3  . lisinopril (PRINIVIL,ZESTRIL) 5 MG tablet TAKE 1 TABLET BY MOUTH EVERY DAY 30 tablet 7  . metoprolol tartrate (LOPRESSOR) 25 MG tablet Take 1 tablet (25 mg total) by mouth 2 (two) times daily. 180 tablet 3  . nitroGLYCERIN (NITROSTAT) 0.4 MG SL tablet Place 1 tablet (0.4 mg total) under the tongue every 5 (five) minutes as needed for chest pain. 25 tablet 3  . ranitidine (ZANTAC) 150 MG tablet TAKE 1 TABLET (150 MG TOTAL) BY MOUTH DAILY. 30 tablet 4  . metFORMIN (GLUCOPHAGE) 1000 MG tablet Take 1 tablet (1,000 mg total) by mouth 2 (two) times daily with a meal. (Patient not taking: Reported on 12/05/2016) 90 tablet 3   No current facility-administered medications for this visit.      Allergies:   Patient has no known allergies.   Social History   Social History  . Marital status: Married    Spouse name: N/A  . Number of children: N/A  . Years of education: N/A   Social History Main Topics  . Smoking status: Former Smoker    Years: 25.00    Types: Cigarettes    Quit date: 07/02/1989  .  Smokeless tobacco: Never Used     Comment: "smoked 2-3 cigarettes/week"  . Alcohol use No  . Drug use: No  . Sexual activity: Yes   Other Topics Concern  . None   Social History Narrative  . None     Family History:  Family History  Problem Relation Age of Onset  . Cancer Father        Lung  . Hypertension Mother   . Cancer Mother        Lung  . Diabetes Mother   . Diabetes Maternal Grandmother   . Alzheimer's disease Maternal Grandmother     ROS:   Please see the history of present illness.  All other systems are reviewed and otherwise negative.    PHYSICAL EXAM:   VS:  BP 132/68   Pulse 68   Ht 4' 11.5" (1.511 m)   Wt 193 lb (87.5 kg)   BMI 38.33 kg/m   BMI: Body mass index is 38.33 kg/m. GEN: Well nourished, well developed obese WF, in no acute distress  HEENT: normocephalic, atraumatic Neck: no JVD, carotid bruits, or masses Cardiac: RRR; no murmurs, rubs, or gallops, no edema  Respiratory:  clear to auscultation bilaterally, normal work of breathing GI: soft, nontender, nondistended, + BS MS: no deformity or atrophy  Skin: warm and dry, no rash Neuro:  Alert and Oriented x 3, Strength and sensation are intact, follows commands Psych: euthymic mood, full affect  Wt Readings from Last 3 Encounters:  12/05/16 193 lb (87.5 kg)  04/30/16 178 lb (80.7 kg)  04/25/16 178 lb 14.4 oz (81.1 kg)      Studies/Labs Reviewed:   EKG:  EKG was ordered today and personally reviewed by me and demonstrates NSR 68bpm, nonspecific TW changes - TWI avL, V2-V3 with flattening in V4-V5. Grossly similar to prior.  Recent Labs: 04/12/2016: ALT 11 11/06/2016: BUN 31; Creatinine, Ser 1.22; Hemoglobin 12.8; Platelets 267; Potassium 4.6; Sodium 140   Lipid Panel    Component Value Date/Time   CHOL 148 04/12/2016 0734   TRIG 208 (H) 04/12/2016 0734   HDL 39 (L) 04/12/2016 0734   CHOLHDL 3.8 04/12/2016 0734   VLDL 42 (H) 04/12/2016 0734   LDLCALC 67 04/12/2016 0734     Additional studies/  records that were reviewed today include: Summarized above    ASSESSMENT & PLAN:   1. Atypical chest pain - somewhat challenging situation. In the past, she's had atypical chest pain but in 2017 this prompted stress test, which ultimately resulted in cath/PCI. She did notice residual sharp chest pain after PCI that did not appear to be affected by the intervention. Question whether discomfort is multifactorial. Interestingly despite 1 week of constant pain prior to ED visit, troponins were negative. Given compounding symptoms of epigastric pain, back pain, diarrhea and abnormal lipase, I feel she would benefit from referral to GI. Will refer. We discussed adding back her PPI versus titrating H2; cost-wise she would prefer increasing Zantac to BID. Per ED notes, Dr. Debara Pickett had reviewed case with EDP and recommended to consider repeat stress testing if she had any recurrent symptoms. I will arrange Lexiscan nuclear stress test. We discussed titration of her cardiac regimen but [name] prefers to leave her medication regimen alone otherwise. If nuc is stable, may consider transitioning Imdur to amlodipine given her history of headaches. Of note her chart triggered for extraneous aspirin due to Excedrin migraine with aspirin component. We discussed avoiding this and using the formulation without extra aspirin. She states she rarely has to use this anyway. 2. CAD - continue ASA, Plavix, statin, BB. Plan nuc as above. 3. Chronic combined CHF - appears euvolemic. Clinically though she appears as though she's gained actual body weight. Importance of weight control reinforced. 4. HTN -  BP minimally above goal. Await nuc before adjusting meds. 5. Acute kidney injury - recheck BMET today. Question whether this was due to diarrhea. 6. GERD - titrate Zantac as above. Warning sx reviewed.  Disposition: F/u with me in 6 weeks.  Medication Adjustments/Labs and Tests Ordered: Current  medicines are reviewed at length with the patient today.  Concerns regarding medicines are outlined above. Medication changes, Labs and Tests ordered today are summarized above and listed in the Patient Instructions accessible in Encounters.   Signed, Charlie Pitter, PA-C  12/05/2016 1:40 PM    [facility] [place], [place],   [address] Phone: [phone]; Fax: [phone]

## 2017-01-01 ENCOUNTER — Encounter (HOSPITAL_COMMUNITY): Payer: Self-pay | Admitting: Cardiology

## 2017-01-10 ENCOUNTER — Encounter: Payer: Self-pay | Admitting: Physician Assistant

## 2017-01-10 NOTE — Progress Notes (Signed)
Cardiology Office Note    Date:  01/11/2017  ID:  [name], DOB 11/20/1950, MRN [id number] PCP:  Carol Ada, MD  Cardiologist:  Dr. Radford Pax   Chief Complaint: f/u cath  History of Present Illness:  [name] is a 66 y.o. female with history of CAD (LAD stent 2007, CABG in 07/2006, Endeavor stent to diagonal 12/2006, cath 2010 with small vessel dz, stable cath 07/2013, s/p DES to the LIMA-LAD in 04/2016), depression, GERD, Graves disease, HTN, migraines, iron deficiency anemia (followed by heme), arthritis, obesity, retinopathy, DM, RLS, chronic LE (at end of the day), chronic combined CHF, probable CKD II who presents for post-cath follow-up.  To recap - prior echo 12/2014 showed EF 40-45%, grade 1 DD, mild LAE. She has been on medical therapy but had run out of medications for several months as she owed a bill to PCP and could not be seen until she paid it. She saw Dr. Radford Pax 03/19/16 at which time she was reporting some chest heaviness, different than prior angina, nonexertional. It was felt possibly related to her uncontrolled BP. She was also reporting chronic dependent LEE at the end of the day and chronic dizziness with balance. Beta blocker, diuretic, statin, metformin were restarted and Lovaza was added. At f/u 04/2016 she was still reporting CP with mixed typical/atypical feature prompting nuclear stress test which was abnormal. Cardiac cath was performed 04/17/16 showing new ostial stenosis at the origin of the LIMA-LAD, known chronic occlusion of SVG-diagonal, EF 45%. She received a drug eluting stent to the LIMA-LAD. DAPT x 1 year was recommended. At f/u 04/2016 after cath she was reporting some atypical discomfort described as a sharp stab of chest pain, different than recent angina; monitoring for progressive symptoms recommended. More recently in 10/2016 she was seen in the ED 10/2016 with constant midsternal chest pain x 1 week, described as pressure with occasional  sharpness, with back pain as well, and bruising to her left shoulder. She also had epigastric pain and watery nonbloody diarrhea as well as left-sided headache. She was treated for migraine. EKG and CXR unremarkable as well as troponin x2. Lipase was elevated at 63. BMET showed K 4.6, BUN 31, Cr 1.22, normal CBC. EDP spoke with cardiology who recommended OP f/u. Due to persistent chest discomfort with mixed features, she underwent nuclear stress test which showed defect in the anterior (mid/distal), lateral (distal and apical walls consistient with ischemia; cannot completely exclude shifiting soft tissue (breast). This was reviewed by Dr. Radford Pax and ultimately it was felt she would require repeat cath to exclude progressive lesion. [place] 12/31/16 showed patent LIMA-LAD, known occlusion of SVG-diagonal, normal LVEF and normal LVEDP. Medical therapy recommended. We also referred her to GI who changed her Zantac back to a PPI. Barium swallow was unremarkable per their notes. Most recent labs showed normal CBC, Cr 1.11 (c/w probable CKD II-III).  She presents back for follow-up today. Chest pain episodes are much less frequent, essentially rare now. Her major complaint is overall fatigue. She has not been on her metformin recently because she ran out. She has 3 glucometers at home but put them away somewhere during a pest extermination process. She has chronic unchanged DOE. No recent orthopnea, dyspnea at rest, LEE, syncope, palpitations. Granddaughter recently gave birth to a baby girl on June 30th so she has been helping out with that, and hasn't had a chance to establish with primary care yet.    Past Medical History:  Diagnosis Date  .  Anemia    "I see dr. Burr Medico @ [place]; I've had transfusion for low iron (04/17/2016)  . Anxiety   . Arthritis    "left hip, fingers"  (04/17/2016)  . Atypical chest pain    a. atypical symptoms since prior cath.  . CAD (coronary artery disease)    a. s/p 2007  LAD  stent. b. s/p 07/2006 CABG with LIMA-LAD & SVG to Diag. c. 12/2006 diagonal branch w/ 2.5 x 12 mm Endeavor stent. d. s/p 2010 cath with small vessel disease treated with RX, stable by repeat 2015. e. CP/abnl nuc -> [facility] 04/17/16: new ostial stenosis of LIMA-LAD s/p DES, EF 45%, chronically occ SVG-D2. f. Stable cath 12/2016.  Marland Kitchen Chronic combined systolic and diastolic CHF (congestive heart failure) ([place])   . CKD (chronic kidney disease), stage II   . Depression   . GERD (gastroesophageal reflux disease)   . SWHQPRFF(638.4)    "maybe once/month" (04/17/2016)  . Hyperlipidemia   . Hypertension   . Migraines    "couple times/yr"  ((04/17/2016)  . Myocardial infarction [facility]) ?2008  . Obesity   . OSA on CPAP    "suppose to wear mask; can't find it" (10/17//2017)  . Osteopenia 12/2010 &01/21/2013   Minimal of hip DEXA   . Pneumonia    "several years" (04/17/2016)  . Retinopathy 12/08   right  . RLS (restless legs syndrome)   . Thyroid disease    "blood work didn't show it but my dr said he can tell by looking at me; I had radiation" (04/17/2016)  . Type II diabetes mellitus ([place])     Past Surgical History:  Procedure Laterality Date  . CARDIAC CATHETERIZATION  02/03/2008   for CP patent LIMA w mid-vessel spasm.  Marland Kitchen CARDIAC CATHETERIZATION N/A 04/17/2016   Procedure: Left Heart Cath and Cors/Grafts Angiography;  Surgeon: Peter M Martinique, MD;  Location: [facility];  Service: Cardiovascular;  Laterality: N/A;  . CARDIAC CATHETERIZATION N/A 04/17/2016   Procedure: Coronary Stent Intervention;  Surgeon: Peter M Martinique, MD;  Location: [facility];  Service: Cardiovascular;  Laterality: N/A;  . COLONOSCOPY  10/2010   rectal polyp  . CORONARY ANGIOPLASTY WITH STENT PLACEMENT     "I've had quite a few stents"   . CORONARY ANGIOPLASTY WITH STENT PLACEMENT  04/17/2016  . CORONARY ARTERY BYPASS GRAFT  ?2008   s/p two vessel CABG off pump LAD diagonal and PTCA/DE Stent mid LAD/ DE stent LAD  diagonal through previous stent sstruts,occluded SVG-diagonal  . LAPAROSCOPIC CHOLECYSTECTOMY  1990's  . LEFT HEART CATH AND CORS/GRAFTS ANGIOGRAPHY N/A 12/31/2016   Procedure: Left Heart Cath and Cors/Grafts Angiography;  Surgeon: Martinique, Peter M, MD;  Location: [facility];  Service: Cardiovascular;  Laterality: N/A;  . LEFT HEART CATHETERIZATION WITH CORONARY ANGIOGRAM N/A 07/15/2013   Procedure: LEFT HEART CATHETERIZATION WITH CORONARY ANGIOGRAM;  Surgeon: Peter M Martinique, MD;  Location: [facility] C[facility];  Service: Cardiovascular;  Laterality: N/A;  . TUBAL LIGATION  1976    Current Medications: Current Meds  Medication Sig  . acetaminophen (TYLENOL) 500 MG tablet Take 2 tablets (1,000 mg total) by mouth every 6 (six) hours as needed for moderate pain or headache. (Patient taking differently: Take 1,000 mg by mouth every 6 (six) hours as needed (for headaches, migraines, or pain). )  . aspirin EC 81 MG tablet Take 1 tablet (81 mg total) by mouth daily.  Marland Kitchen atorvastatin (LIPITOR) 80 MG tablet Take 1  tablet (80 mg total) by mouth every evening.  . clopidogrel (PLAVIX) 75 MG tablet Take 1 tablet (75 mg total) by mouth daily.  Marland Kitchen escitalopram (LEXAPRO) 10 MG tablet Take 1 tablet (10 mg total) by mouth daily. (Patient taking differently: Take 10 mg by mouth every morning. )  . ferrous sulfate 325 (65 FE) MG EC tablet Take 1 tablet (325 mg total) by mouth daily with breakfast.  . hydrochlorothiazide (HYDRODIURIL) 25 MG tablet Take 1 tablet (25 mg total) by mouth daily. (Patient taking differently: Take 25 mg by mouth every morning. )  . isosorbide mononitrate (IMDUR) 60 MG 24 hr tablet Take 1 tablet (60 mg total) by mouth daily. (Patient taking differently: Take 60 mg by mouth every morning. )  . lisinopril (PRINIVIL,ZESTRIL) 5 MG tablet TAKE 1 TABLET BY MOUTH EVERY DAY  . metoprolol tartrate (LOPRESSOR) 25 MG tablet Take 1 tablet (25 mg total) by mouth 2 (two) times daily.  . nitroGLYCERIN  (NITROSTAT) 0.4 MG SL tablet Place 1 tablet (0.4 mg total) under the tongue every 5 (five) minutes as needed for chest pain.  . pantoprazole (PROTONIX) 40 MG tablet Take 1 tablet (40 mg total) by mouth daily.     Allergies:   Patient has no known allergies.   Social History   Social History  . Marital status: Married    Spouse name: N/A  . Number of children: N/A  . Years of education: N/A   Social History Main Topics  . Smoking status: Former Smoker    Years: 25.00    Types: Cigarettes    Quit date: 07/02/1989  . Smokeless tobacco: Never Used     Comment: "smoked 2-3 cigarettes/week"  . Alcohol use No  . Drug use: No  . Sexual activity: Yes   Other Topics Concern  . None   Social History Narrative  . None     Family History:  Family History  Problem Relation Age of Onset  . Cancer Father        Lung  . Hypertension Mother   . Cancer Mother        Lung  . Diabetes Mother   . Diabetes Maternal Grandmother   . Alzheimer's disease Maternal Grandmother     ROS:   Please see the history of present illness.  All other systems are reviewed and otherwise negative.    PHYSICAL EXAM:   VS:  BP 124/66   Pulse 77   Ht 4' 11.5" (1.511 m)   Wt 194 lb (88 kg)   BMI 38.53 kg/m   BMI: Body mass index is 38.53 kg/m. GEN: Well nourished, well developed obese WF, in no acute distress  HEENT: normocephalic, atraumatic Neck: no JVD, carotid bruits, or masses Cardiac: RRR; no murmurs, rubs, or gallops, no edema  Respiratory:  clear to auscultation bilaterally, normal work of breathing GI: soft, nontender, nondistended, + BS MS: no deformity or atrophy  Skin: warm and dry, no rash. Right groin cath site without hematoma, ecchymosis, or bruit. Neuro:  Alert and Oriented x 3, Strength and sensation are intact, follows commands Psych: euthymic mood, full affect  Wt Readings from Last 3 Encounters:  01/11/17 194 lb (88 kg)  12/31/16 193 lb (87.5 kg)  12/26/16 193 lb (87.5  kg)      Studies/Labs Reviewed:   EKG:   EKG was not ordered today.  Recent Labs: 04/12/2016: ALT 11 12/28/2016: BUN 24; Creatinine, Ser 1.11; Hemoglobin 12.7; Platelets 220; Potassium 4.6; Sodium 136  Lipid Panel    Component Value Date/Time   CHOL 148 04/12/2016 0734   TRIG 208 (H) 04/12/2016 0734   HDL 39 (L) 04/12/2016 0734   CHOLHDL 3.8 04/12/2016 0734   VLDL 42 (H) 04/12/2016 0734   LDLCALC 67 04/12/2016 0734    Additional studies/ records that were reviewed today include: Summarized above    ASSESSMENT & PLAN:   1. Atypical chest pain - at this time felt noncardiac. Episodes have decreased since switch back to PPI. Continue present regimen.  2. CAD - stable by recent cath. She is tolerating DAPT without adverse event. Her cath report 04/2016 indicated recommendation to continue DAPT for 1 year. Would continue for now and then discuss ultimate duration with Dr. Radford Pax upon follow-up at the 1 year mark. Continue BB and statin.  3. Chronic combined CHF - appears euvolemic. Suspect chronic DOE is related to weight and deconditioning. Discussed importance of long-term healthy weight loss and exercise. 4. Essential HTN - well controlled today. 5. Uncontrolled diabetes mellitus with CKD II - recent CBG on pre-cath labs was 497. She reveals she's been out of her metformin. We keep trying to get her back into primary care but he has not committed to this yet. She states she cannot go back to her previous PCP due to owing a bill. We referred her to [facility] last visit but she did not yet go, citing obligations related to her granddaughter's new baby. I told her we can restart her metformin at prior dose (#60 with 2 refills to allow enough time to obtain an appointment) but that further refills will have to come from primary care. We discussed risk of uncontrolled diabetes including chronic pain, neuropathy, amputations, dialysis and blindness. I will  recheck A1C today and also obtain post-cath BMET to make sure CKD is stable. If fatigue persists beyond DM control, may need to consider sleep study.  Disposition: Initially planned f/u with Dr .Radford Pax in 6 months. However, given that cath note raises question of duration of Plavix through 04/2017, will move this up to October to have Dr. Radford Pax weigh in on cessation vs continuation of Plavix. Spoke with nurse regarding changing this.  Medication Adjustments/Labs and Tests Ordered: Current medicines are reviewed at length with the patient today.  Concerns regarding medicines are outlined above. Medication changes, Labs and Tests ordered today are summarized above and listed in the Patient Instructions accessible in Encounters.   Signed, Charlie Pitter, PA-C  01/11/2017 8:16 AM    [facility] [address], [place], [place]  [address] Phone: [phone]; Fax: [phone]

## 2017-01-11 ENCOUNTER — Telehealth: Payer: Self-pay | Admitting: *Deleted

## 2017-01-11 ENCOUNTER — Ambulatory Visit (INDEPENDENT_AMBULATORY_CARE_PROVIDER_SITE_OTHER): Payer: Medicare Other | Admitting: Physician Assistant

## 2017-01-11 ENCOUNTER — Encounter: Payer: Self-pay | Admitting: Physician Assistant

## 2017-01-11 VITALS — BP 124/66 | HR 77 | Ht 59.5 in | Wt 194.0 lb

## 2017-01-11 DIAGNOSIS — I10 Essential (primary) hypertension: Secondary | ICD-10-CM

## 2017-01-11 DIAGNOSIS — R0789 Other chest pain: Secondary | ICD-10-CM | POA: Diagnosis not present

## 2017-01-11 DIAGNOSIS — I5042 Chronic combined systolic (congestive) and diastolic (congestive) heart failure: Secondary | ICD-10-CM

## 2017-01-11 DIAGNOSIS — E0865 Diabetes mellitus due to underlying condition with hyperglycemia: Secondary | ICD-10-CM

## 2017-01-11 DIAGNOSIS — E0822 Diabetes mellitus due to underlying condition with diabetic chronic kidney disease: Secondary | ICD-10-CM

## 2017-01-11 DIAGNOSIS — Z9861 Coronary angioplasty status: Secondary | ICD-10-CM

## 2017-01-11 DIAGNOSIS — I2510 Atherosclerotic heart disease of native coronary artery without angina pectoris: Secondary | ICD-10-CM | POA: Diagnosis not present

## 2017-01-11 MED ORDER — METFORMIN HCL 1000 MG PO TABS: 1000.0000 mg | ORAL_TABLET | Freq: Two times a day (BID) | ORAL | 2 refills | Status: DC

## 2017-01-11 NOTE — Telephone Encounter (Signed)
SPOKE TO PT ABOUT COMING BACK IN OFFICE SOONER (LATE October EARLY NOVEMBER) TO SEE DR TURNER DUE TO POSSIBLY STOPPING PLAVIX IN YEAR PER TURNER

## 2017-01-11 NOTE — Patient Instructions (Signed)
Medication Instructions:   START TAKING METFORMIN 1000 MG TWICE A DAY   If you need a refill on your cardiac medications before your next appointment, please call your pharmacy.  Labwork:  A1C AND BMET TODAY    Testing/Procedures: NONE ORDERED  TODAY    Follow-Up:  Your physician wants you to follow-up in:  IN  6  MONTHS WITH DR Radford Pax  You will receive a reminder letter in the mail two months in advance. If you don't receive a letter, please call our office to schedule the follow-up appointment.      Any Other Special Instructions Will Be Listed Below (If Applicable).

## 2017-01-12 LAB — BASIC METABOLIC PANEL
BUN/Creatinine Ratio: 23 (ref 12–28)
BUN: 23 mg/dL (ref 8–27)
CALCIUM: 9.5 mg/dL (ref 8.7–10.3)
CHLORIDE: 96 mmol/L (ref 96–106)
CO2: 24 mmol/L (ref 20–29)
Creatinine, Ser: 1 mg/dL (ref 0.57–1.00)
GFR calc non Af Amer: 58 mL/min/{1.73_m2} — ABNORMAL LOW (ref >59)
GFR, EST AFRICAN AMERICAN: 67 mL/min/{1.73_m2} (ref >59)
Glucose: 282 mg/dL — ABNORMAL HIGH (ref 65–99)
POTASSIUM: 4.5 mmol/L (ref 3.5–5.2)
Sodium: 137 mmol/L (ref 134–144)

## 2017-01-12 LAB — HEMOGLOBIN A1C
ESTIMATED AVERAGE GLUCOSE: 280 mg/dL
HEMOGLOBIN A1C: 11.4 % — AB (ref 4.8–5.6)

## 2017-01-16 ENCOUNTER — Other Ambulatory Visit (HOSPITAL_BASED_OUTPATIENT_CLINIC_OR_DEPARTMENT_OTHER): Payer: Medicare Other

## 2017-01-16 DIAGNOSIS — D508 Other iron deficiency anemias: Secondary | ICD-10-CM

## 2017-01-16 DIAGNOSIS — D500 Iron deficiency anemia secondary to blood loss (chronic): Secondary | ICD-10-CM | POA: Diagnosis present

## 2017-01-16 LAB — CBC & DIFF AND RETIC
BASO%: 0.1 % (ref 0.0–2.0)
Basophils Absolute: 0 10*3/uL (ref 0.0–0.1)
EOS ABS: 0.2 10*3/uL (ref 0.0–0.5)
EOS%: 1.4 % (ref 0.0–7.0)
HCT: 39.4 % (ref 34.8–46.6)
HEMOGLOBIN: 13.3 g/dL (ref 11.6–15.9)
Immature Retic Fract: 6.9 % (ref 1.60–10.00)
LYMPH#: 1.6 10*3/uL (ref 0.9–3.3)
LYMPH%: 12.4 % — ABNORMAL LOW (ref 14.0–49.7)
MCH: 31.4 pg (ref 25.1–34.0)
MCHC: 33.8 g/dL (ref 31.5–36.0)
MCV: 93.1 fL (ref 79.5–101.0)
MONO#: 0.5 10*3/uL (ref 0.1–0.9)
MONO%: 4.1 % (ref 0.0–14.0)
NEUT%: 82 % — ABNORMAL HIGH (ref 38.4–76.8)
NEUTROS ABS: 10.4 10*3/uL — AB (ref 1.5–6.5)
PLATELETS: 200 10*3/uL (ref 145–400)
RBC: 4.23 10*6/uL (ref 3.70–5.45)
RDW: 12.5 % (ref 11.2–14.5)
RETIC %: 1.84 % (ref 0.70–2.10)
RETIC CT ABS: 77.83 10*3/uL (ref 33.70–90.70)
WBC: 12.7 10*3/uL — AB (ref 3.9–10.3)

## 2017-01-16 LAB — IRON AND TIBC
%SAT: 21 % (ref 21–57)
Iron: 59 ug/dL (ref 41–142)
TIBC: 281 ug/dL (ref 236–444)
UIBC: 222 ug/dL (ref 120–384)

## 2017-01-16 LAB — FERRITIN: Ferritin: 108 ng/ml (ref 9–269)

## 2017-01-23 ENCOUNTER — Encounter: Payer: Self-pay | Admitting: Hematology

## 2017-01-23 ENCOUNTER — Ambulatory Visit (HOSPITAL_BASED_OUTPATIENT_CLINIC_OR_DEPARTMENT_OTHER): Payer: Medicare Other | Admitting: Hematology

## 2017-01-23 VITALS — BP 118/60 | HR 70 | Temp 98.0°F | Ht 59.5 in | Wt 195.1 lb

## 2017-01-23 DIAGNOSIS — D509 Iron deficiency anemia, unspecified: Secondary | ICD-10-CM | POA: Diagnosis not present

## 2017-01-23 DIAGNOSIS — I2510 Atherosclerotic heart disease of native coronary artery without angina pectoris: Secondary | ICD-10-CM

## 2017-01-23 DIAGNOSIS — Z9861 Coronary angioplasty status: Secondary | ICD-10-CM | POA: Diagnosis not present

## 2017-01-23 NOTE — Progress Notes (Signed)
Patient came in states doctor sent her in to get financial help. She states she doesn't have insurance only Medicare. She owes her PCP and can't be seen until she pays. Patient states she does not have the money to pay.   Advised patient we assistance that will assist with this expense. Asked patient if she has ever applied for Medicaid. Patient states she has not. Gave patient a Medicaid application and advised to turn in to DSS once completed and they have up to 90 days to make a decision. DSS will contact the patient directly for any other needed information as well as status of application. If approved, this may help with the balance left after her Medicare pays or help with premium.  Gave patient address and phone numbers to both:  [facility] and [place] [address] [id number] [phone]

## 2017-01-23 NOTE — Progress Notes (Signed)
[place]  [phone] [phone]  Clinic follow Up Note   Patient Care Team: Carol Ada, MD as PCP - General (Family Medicine) Sueanne Margarita, MD as Consulting Physician (Cardiology) 01/23/2017  CHIEF COMPLAINTS:  Follow up anemia   HISTORY OF PRESENTING ILLNESS:  [name] 66 y.o. female is here because of anemia.  She was found to have abnormal CBC since she was a child. She used to eat liver when she was a child. She does not remember her blood counts. She was not taking oral iron supplement until 6 months ago, she is on MVI with iron (18mg ) once daily. Her last CBC done by her PCP from 11/10/2014 showed WBC 10.2, normal differential, hemoglobin 10.1, hematocrit 31.0, MCV 83.9. Platelet count normal at 2 03/03/2014  She denies recent chest pain on exertion, she does have some shortness of breath on mild exertion, such as walking for a block, pre-syncopal episodes, or palpitations. She had not noticed any recent bleeding such as epistaxis, hematuria or hematochezia The patient takes aspirin 81mg  daily, no other NSAID Her last colonoscopy was sabout 5 years ago at [facility], she also had EGD, which all negative per patient.  She had no prior history or diagnosis of cancer. Her last screening mammogram was several years ago.  She denies any pica and eats a variety of diet. She never donated blood or received blood transfusion She had normal period, not heavy, and LMP was over 10 years ago.   She has some pain at left shoulder blade for a few years, no limited ROM of left shoulder. She otherwise feels well overball. 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   CURRENT THERAPY: iv Feraheme 510mg  weekly X2 as needed, she received on 03/08/15, 03/15/2015, ferrous sulfate 1 tablet daily  INTERIM HISTORY  [name] returns for follow-up. She presents to the clinic today with her husband. She reports things are well  and her husband has retired. They get more time to spend together. She is still taking the iron pill. Her BM is black due to pill. She takes one a day. She takes metformin and that gives her diarrhea. She does not have insurance right now and so the colonoscopy she will have to pay out of pocket. She does not have the [facility] [employer] card. She does have medicare.  She has stopped her metformin due to not being able to get a refill because of a past bill with Dr. [facility].  She does no currently have chest pain, but she regularly sees her cardiologist.     MEDICAL HISTORY:  Past Medical History:  Diagnosis Date  . Anemia    "I see dr. Burr Medico @ [place]; I've had transfusion for low iron (04/17/2016)  . Anxiety   . Arthritis    "left hip, fingers"  (04/17/2016)  . Atypical chest pain    a. atypical symptoms since prior cath.  . CAD (coronary artery disease)    a. s/p 2007  LAD stent. b. s/p 07/2006 CABG with LIMA-LAD & SVG to Diag. c. 12/2006 diagonal branch w/ 2.5 x 12 mm Endeavor stent. d. s/p 2010 cath with small vessel disease treated with RX, stable by repeat 2015. e. CP/abnl nuc -> LHC 04/17/16: new ostial stenosis of LIMA-LAD s/p DES, EF 45%, chronically occ SVG-D2. f. Stable cath 12/2016.  Marland Kitchen Chronic combined systolic and diastolic CHF (congestive heart failure) ([place])   . CKD (chronic  kidney disease), stage II   . Depression   . GERD (gastroesophageal reflux disease)   . VOHYWVPX(106.2)    "maybe once/month" (04/17/2016)  . Hyperlipidemia   . Hypertension   . Migraines    "couple times/yr"  ((04/17/2016)  . Myocardial infarction [facility]) ?2008  . Obesity   . OSA on CPAP    "suppose to wear mask; can't find it" (10/17//2017)  . Osteopenia 12/2010 &01/21/2013   Minimal of hip DEXA   . Pneumonia    "several years" (04/17/2016)  . Retinopathy 12/08   right  . RLS (restless legs syndrome)   . Thyroid disease    "blood work didn't show it but my dr said he can tell by  looking at me; I had radiation" (04/17/2016)  . Type II diabetes mellitus ([place])     SURGICAL HISTORY: Past Surgical History:  Procedure Laterality Date  . CARDIAC CATHETERIZATION  02/03/2008   for CP patent LIMA w mid-vessel spasm.  Marland Kitchen CARDIAC CATHETERIZATION N/A 04/17/2016   Procedure: Left Heart Cath and Cors/Grafts Angiography;  Surgeon: Peter M Martinique, MD;  Location: [facility];  Service: Cardiovascular;  Laterality: N/A;  . CARDIAC CATHETERIZATION N/A 04/17/2016   Procedure: Coronary Stent Intervention;  Surgeon: Peter M Martinique, MD;  Location: [facility];  Service: Cardiovascular;  Laterality: N/A;  . COLONOSCOPY  10/2010   rectal polyp  . CORONARY ANGIOPLASTY WITH STENT PLACEMENT     "I've had quite a few stents"   . CORONARY ANGIOPLASTY WITH STENT PLACEMENT  04/17/2016  . CORONARY ARTERY BYPASS GRAFT  ?2008   s/p two vessel CABG off pump LAD diagonal and PTCA/DE Stent mid LAD/ DE stent LAD diagonal through previous stent sstruts,occluded SVG-diagonal  . LAPAROSCOPIC CHOLECYSTECTOMY  1990's  . LEFT HEART CATH AND CORS/GRAFTS ANGIOGRAPHY N/A 12/31/2016   Procedure: Left Heart Cath and Cors/Grafts Angiography;  Surgeon: Martinique, Peter M, MD;  Location: [facility];  Service: Cardiovascular;  Laterality: N/A;  . LEFT HEART CATHETERIZATION WITH CORONARY ANGIOGRAM N/A 07/15/2013   Procedure: LEFT HEART CATHETERIZATION WITH CORONARY ANGIOGRAM;  Surgeon: Peter M Martinique, MD;  Location: [facility] CATH LAB;  Service: Cardiovascular;  Laterality: N/A;  . TUBAL LIGATION  1976    SOCIAL HISTORY: Social History   Social History  . Marital status: Married    Spouse name: N/A  . Number of children: N/A  . Years of education: N/A   Occupational History  . Not on file.   Social History Main Topics  . Smoking status: Former Smoker    Years: 25.00    Types: Cigarettes    Quit date: 07/02/1989  . Smokeless tobacco: Never Used     Comment: "smoked 2-3 cigarettes/week"  . Alcohol  use No  . Drug use: No  . Sexual activity: Yes   Other Topics Concern  . Not on file   Social History Narrative  . No narrative on file    FAMILY HISTORY: Family History  Problem Relation Age of Onset  . Cancer Father        Lung  . Hypertension Mother   . Cancer Mother        Lung  . Diabetes Mother   . Diabetes Maternal Grandmother   . Alzheimer's disease Maternal Grandmother     ALLERGIES:  has No Known Allergies.  MEDICATIONS:  Current Outpatient Prescriptions  Medication Sig Dispense Refill  . acetaminophen (TYLENOL) 500 MG tablet Take 2 tablets (1,000 mg total) by mouth  every 6 (six) hours as needed for moderate pain or headache. (Patient taking differently: Take 1,000 mg by mouth every 6 (six) hours as needed (for headaches, migraines, or pain). )    . aspirin EC 81 MG tablet Take 1 tablet (81 mg total) by mouth daily. 30 tablet 11  . atorvastatin (LIPITOR) 80 MG tablet Take 1 tablet (80 mg total) by mouth every evening. 90 tablet 3  . clopidogrel (PLAVIX) 75 MG tablet Take 1 tablet (75 mg total) by mouth daily. 30 tablet 11  . escitalopram (LEXAPRO) 10 MG tablet Take 1 tablet (10 mg total) by mouth daily. (Patient taking differently: Take 10 mg by mouth every morning. ) 90 tablet 3  . ferrous sulfate 325 (65 FE) MG EC tablet Take 1 tablet (325 mg total) by mouth daily with breakfast. 90 tablet 3  . hydrochlorothiazide (HYDRODIURIL) 25 MG tablet Take 1 tablet (25 mg total) by mouth daily. (Patient taking differently: Take 25 mg by mouth every morning. ) 90 tablet 3  . isosorbide mononitrate (IMDUR) 60 MG 24 hr tablet Take 1 tablet (60 mg total) by mouth daily. (Patient taking differently: Take 60 mg by mouth every morning. ) 90 tablet 3  . lisinopril (PRINIVIL,ZESTRIL) 5 MG tablet TAKE 1 TABLET BY MOUTH EVERY DAY 30 tablet 7  . metFORMIN (GLUCOPHAGE) 1000 MG tablet Take 1 tablet (1,000 mg total) by mouth 2 (two) times daily with a meal. 60 tablet 2  . metoprolol tartrate  (LOPRESSOR) 25 MG tablet Take 1 tablet (25 mg total) by mouth 2 (two) times daily. 180 tablet 3  . nitroGLYCERIN (NITROSTAT) 0.4 MG SL tablet Place 1 tablet (0.4 mg total) under the tongue every 5 (five) minutes as needed for chest pain. 25 tablet 3  . pantoprazole (PROTONIX) 40 MG tablet Take 1 tablet (40 mg total) by mouth daily. 30 tablet 5   No current facility-administered medications for this visit.     REVIEW OF SYSTEMS:   Constitutional: Denies fevers, chills or abnormal night sweats Eyes: Denies blurriness of vision, double vision or watery eyes Ears, nose, mouth, throat, and face: Denies mucositis or sore throat Respiratory: Denies cough, dyspnea or wheezes Cardiovascular: Denies palpitation, chest discomfort or lower extremity swelling Gastrointestinal:  Denies nausea, heartburn or change (+) black stool (+) diarrhea due to medication Skin: Denies abnormal skin rashes Lymphatics: Denies new lymphadenopathy or easy bruising Neurological:Denies numbness, tingling or new weaknesses Behavioral/Psych: Mood is stable, no new changes  All other systems were reviewed with the patient and are negative.  PHYSICAL EXAMINATION: ECOG PERFORMANCE STATUS: 0  Vitals:   01/23/17 0816  BP: 118/60  Pulse: 70  Temp: 98 F (36.7 C)   Filed Weights   01/23/17 0816  Weight: 195 lb 1.6 oz (88.5 kg)    GENERAL:alert, no distress and comfortable SKIN: skin color, texture, turgor are normal, no rashes or significant lesions, except a few ecchymosis on her left arm and right leg.  EYES: normal, conjunctiva are pink and non-injected, sclera clear OROPHARYNX:no exudate, no erythema and lips, buccal mucosa, and tongue normal  NECK: supple, thyroid normal size, non-tender, without nodularity LYMPH:  no palpable lymphadenopathy in the cervical, axillary or inguinal LUNGS: clear to auscultation and percussion with normal breathing effort, scatter crackers on bilaterally lung basis HEART: regular  rate & rhythm and no murmurs and no lower extremity edema ABDOMEN:abdomen soft, normal bowel sounds, (+) mild tenderness at the left upper quadrant of abdomen, no organomegaly. Musculoskeletal:no cyanosis of digits  and no clubbing, (+)  bilateral pitting edema up to any  PSYCH: alert & oriented x 3 with fluent speech NEURO: no focal motor/sensory deficits  LABORATORY DATA:  I have reviewed the data as listed CBC Latest Ref Rng & Units 01/16/2017 12/28/2016 11/06/2016  WBC 3.9 - 10.3 10e3/uL 12.7(H) 8.3 9.9  Hemoglobin 11.6 - 15.9 g/dL 13.3 12.7 12.8  Hematocrit 34.8 - 46.6 % 39.4 39.0 37.0  Platelets 145 - 400 10e3/uL 200 220 267    CMP Latest Ref Rng & Units 01/11/2017 12/28/2016 12/05/2016  Glucose 65 - 99 mg/dL 282(H) 497(H) 270(H)  BUN 8 - 27 mg/dL 23 24 21   Creatinine 0.57 - 1.00 mg/dL 1.00 1.11(H) 1.07(H)  Sodium 134 - 144 mmol/L 137 136 139  Potassium 3.5 - 5.2 mmol/L 4.5 4.6 3.5  Chloride 96 - 106 mmol/L 96 97 97  CO2 20 - 29 mmol/L 24 21 23   Calcium 8.7 - 10.3 mg/dL 9.5 9.3 9.2  Total Protein 6.1 - 8.1 g/dL - - -  Total Bilirubin 0.2 - 1.2 mg/dL - - -  Alkaline Phos 33 - 130 U/L - - -  AST 10 - 35 U/L - - -  ALT 6 - 29 U/L - - -   Results for CHELESA, WEINGARTNER (MRN [id number]) as of 01/23/2017 08:31  Ref. Range 01/11/2016 09:06 04/20/2016 09:15 01/16/2017 07:37  Iron Latest Ref Range: 41 - 142 ug/dL 59 85 59  UIBC Latest Ref Range: 120 - 384 ug/dL 231 202 222  TIBC Latest Ref Range: 236 - 444 ug/dL 290 288 281  %SAT Latest Ref Range: 21 - 57 % 20 (L) 30 21  Ferritin Latest Ref Range: 9 - 269 ng/ml 110 165 108     RADIOGRAPHIC STUDIES: I have personally reviewed the radiological images as listed and agreed with the findings in the report. No results found.  ASSESSMENT & PLAN:  66 y.o. Caucasian female, with long-standing history of anemia since childhood (per patient), was referred for anemia evaluation.  1. Iron deficient anemia  -She previously had mild anemia was normal  MCV and low reticulocyte count. -She previously had low serum iron level at 34, low transferrin saturation 9%, normal TIBC 372, ferritin 11. This is supportive for iron deficiency anemia, but her anemia is slightly outer portion of her iron deficiency. She may have anemia of chronic disease also. -Her folic acid and C58 level were previously normal, SPEP were negative for M protein, TSH was normal, erythropoietin was elevated at 44.8, sedimentation rate was normal, and hemoglobin electrophoresis was normal. -She previously responded very well to IV Feraheme, her anemia has resolved. -She will continue oral ferrous sulfate once daily -Continue monitoring her blood counts and iron level. -The etiology of iron deficient anemia is not clear, she does have mild diarrhea, left upper quadrant abdominal tenderness, her last colonoscopy was 5 years ago, done at [facility]. I previously refered her back for GI workup including repeated endoscopy, which is she has not been able to see them due to her remaining balance.  -Her iron levels have improved and I suggest she just continue with the iron pill.  -I reviewed her lab results with her they are overall normal except her blood sugar and her elevated white count.  -We will f/u once a year and continue with labs every 4 months  2. Diabetes, CAD,  -She'll continue follow-up with her primary care physician and her cardiologist Dr. Heron Nay -Her sugar has been high lately.  -  She takes Metformin 1000mg  twice a day, but needs a refill but could not get it because of a bill.  -I refer her to financial office today to see if she can switch her care to [facility]   3. Cancer screening -She has not had a mammogram for several years, I scheduled 1 for her at [facility] center (she used to go there) but she has not gone yet     Plan - Lab every 4 months  -F/u in one year with lab one week before  -I refer her to financial office today to see if she can switch her primary care  to [facility]   All questions were answered. The patient knows to call the clinic with any problems, questions or concerns.  I spent 15 minutes counseling the patient face to face. The total time spent in the appointment was 20 minutes and more than 50% was on counseling.  This document serves as a record of services personally performed by Truitt Merle, MD. It was created on her behalf by Joslyn Devon, a trained medical scribe. The creation of this record is based on the scribe's personal observations and the provider's statements to them. This document has been checked and approved by the attending provider.     Truitt Merle, MD 01/23/2017

## 2017-02-27 DIAGNOSIS — F329 Major depressive disorder, single episode, unspecified: Secondary | ICD-10-CM | POA: Diagnosis not present

## 2017-02-27 DIAGNOSIS — Z23 Encounter for immunization: Secondary | ICD-10-CM | POA: Diagnosis not present

## 2017-02-27 DIAGNOSIS — E1122 Type 2 diabetes mellitus with diabetic chronic kidney disease: Secondary | ICD-10-CM | POA: Diagnosis not present

## 2017-02-27 DIAGNOSIS — E669 Obesity, unspecified: Secondary | ICD-10-CM | POA: Diagnosis not present

## 2017-02-27 DIAGNOSIS — I10 Essential (primary) hypertension: Secondary | ICD-10-CM | POA: Diagnosis not present

## 2017-02-27 DIAGNOSIS — Z7984 Long term (current) use of oral hypoglycemic drugs: Secondary | ICD-10-CM | POA: Diagnosis not present

## 2017-02-27 DIAGNOSIS — N183 Chronic kidney disease, stage 3 (moderate): Secondary | ICD-10-CM | POA: Diagnosis not present

## 2017-02-27 DIAGNOSIS — E7800 Pure hypercholesterolemia, unspecified: Secondary | ICD-10-CM | POA: Diagnosis not present

## 2017-03-27 ENCOUNTER — Other Ambulatory Visit: Payer: Self-pay | Admitting: Cardiology

## 2017-04-02 DIAGNOSIS — Z7984 Long term (current) use of oral hypoglycemic drugs: Secondary | ICD-10-CM | POA: Diagnosis not present

## 2017-04-02 DIAGNOSIS — E7800 Pure hypercholesterolemia, unspecified: Secondary | ICD-10-CM | POA: Diagnosis not present

## 2017-04-02 DIAGNOSIS — I10 Essential (primary) hypertension: Secondary | ICD-10-CM | POA: Diagnosis not present

## 2017-04-02 DIAGNOSIS — N183 Chronic kidney disease, stage 3 (moderate): Secondary | ICD-10-CM | POA: Diagnosis not present

## 2017-04-02 DIAGNOSIS — Z23 Encounter for immunization: Secondary | ICD-10-CM | POA: Diagnosis not present

## 2017-04-02 DIAGNOSIS — I2510 Atherosclerotic heart disease of native coronary artery without angina pectoris: Secondary | ICD-10-CM | POA: Diagnosis not present

## 2017-04-02 DIAGNOSIS — E1122 Type 2 diabetes mellitus with diabetic chronic kidney disease: Secondary | ICD-10-CM | POA: Diagnosis not present

## 2017-05-07 ENCOUNTER — Ambulatory Visit: Payer: Medicare Other | Admitting: Cardiology

## 2017-05-29 ENCOUNTER — Other Ambulatory Visit (HOSPITAL_BASED_OUTPATIENT_CLINIC_OR_DEPARTMENT_OTHER): Payer: Medicare Other

## 2017-05-29 DIAGNOSIS — D508 Other iron deficiency anemias: Secondary | ICD-10-CM

## 2017-05-29 DIAGNOSIS — D500 Iron deficiency anemia secondary to blood loss (chronic): Secondary | ICD-10-CM | POA: Diagnosis present

## 2017-05-29 LAB — CBC & DIFF AND RETIC
BASO%: 0.1 % (ref 0.0–2.0)
BASOS ABS: 0 10*3/uL (ref 0.0–0.1)
EOS%: 0.9 % (ref 0.0–7.0)
Eosinophils Absolute: 0.1 10*3/uL (ref 0.0–0.5)
HEMATOCRIT: 39 % (ref 34.8–46.6)
HGB: 13.2 g/dL (ref 11.6–15.9)
Immature Retic Fract: 4.4 % (ref 1.60–10.00)
LYMPH%: 23.2 % (ref 14.0–49.7)
MCH: 30.8 pg (ref 25.1–34.0)
MCHC: 33.8 g/dL (ref 31.5–36.0)
MCV: 91.1 fL (ref 79.5–101.0)
MONO#: 0.3 10*3/uL (ref 0.1–0.9)
MONO%: 3.3 % (ref 0.0–14.0)
NEUT#: 7.2 10*3/uL — ABNORMAL HIGH (ref 1.5–6.5)
NEUT%: 72.5 % (ref 38.4–76.8)
PLATELETS: 233 10*3/uL (ref 145–400)
RBC: 4.28 10*6/uL (ref 3.70–5.45)
RDW: 12.9 % (ref 11.2–14.5)
RETIC %: 1.13 % (ref 0.70–2.10)
Retic Ct Abs: 48.36 10*3/uL (ref 33.70–90.70)
WBC: 9.9 10*3/uL (ref 3.9–10.3)
lymph#: 2.3 10*3/uL (ref 0.9–3.3)

## 2017-05-29 LAB — IRON AND TIBC
%SAT: 26 % (ref 21–57)
IRON: 76 ug/dL (ref 41–142)
TIBC: 293 ug/dL (ref 236–444)
UIBC: 217 ug/dL (ref 120–384)

## 2017-05-29 LAB — FERRITIN: Ferritin: 100 ng/ml (ref 9–269)

## 2017-06-10 ENCOUNTER — Other Ambulatory Visit: Payer: Self-pay | Admitting: Physician Assistant

## 2017-06-10 DIAGNOSIS — IMO0002 Reserved for concepts with insufficient information to code with codable children: Secondary | ICD-10-CM

## 2017-06-10 DIAGNOSIS — E0865 Diabetes mellitus due to underlying condition with hyperglycemia: Principal | ICD-10-CM

## 2017-06-10 DIAGNOSIS — E0822 Diabetes mellitus due to underlying condition with diabetic chronic kidney disease: Secondary | ICD-10-CM

## 2017-06-17 ENCOUNTER — Telehealth: Payer: Self-pay | Admitting: *Deleted

## 2017-06-17 NOTE — Telephone Encounter (Signed)
Spoke with pt and informed her of lab results from 11/28 were all normal as per Dr. Ernestina Penna instructions.  Pt voiced understanding.

## 2017-06-17 NOTE — Telephone Encounter (Signed)
-----   Message from Truitt Merle, MD sent at 06/16/2017 12:50 PM EST ----- Please let her lab results from 11/28, CBC, and iron study were all normal, thanks.   Truitt Merle  06/16/2017

## 2017-07-03 DIAGNOSIS — I2510 Atherosclerotic heart disease of native coronary artery without angina pectoris: Secondary | ICD-10-CM | POA: Diagnosis not present

## 2017-07-03 DIAGNOSIS — I10 Essential (primary) hypertension: Secondary | ICD-10-CM | POA: Diagnosis not present

## 2017-07-03 DIAGNOSIS — E1122 Type 2 diabetes mellitus with diabetic chronic kidney disease: Secondary | ICD-10-CM | POA: Diagnosis not present

## 2017-07-03 DIAGNOSIS — Z7984 Long term (current) use of oral hypoglycemic drugs: Secondary | ICD-10-CM | POA: Diagnosis not present

## 2017-07-03 DIAGNOSIS — E7800 Pure hypercholesterolemia, unspecified: Secondary | ICD-10-CM | POA: Diagnosis not present

## 2017-07-09 ENCOUNTER — Telehealth: Payer: Self-pay

## 2017-07-09 DIAGNOSIS — E7841 Elevated Lipoprotein(a): Secondary | ICD-10-CM

## 2017-07-09 MED ORDER — EZETIMIBE 10 MG PO TABS: 10.0000 mg | ORAL_TABLET | Freq: Every day | ORAL | 3 refills | Status: DC

## 2017-07-09 NOTE — Telephone Encounter (Signed)
Notes recorded by Teressa Senter, RN on 07/09/2017 at 11:19 AM EST Patient made aware of lab results and Dr. Theodosia Blender recommendation to add Zetia 10 mg once a day. Pt scheduled for repeat FLP and ALT on 08/27/17. Patient in agreement with plan, verbalized understanding and thanked me for the call.   Notes recorded by Sueanne Margarita, MD on 07/06/2017 at 7:57 PM EST LDL 82 and goal is < 70. Please add zetia 10mg  daily and repeat FLP and ALT in 6 wees

## 2017-07-14 ENCOUNTER — Encounter: Payer: Self-pay | Admitting: Cardiology

## 2017-07-14 DIAGNOSIS — I5032 Chronic diastolic (congestive) heart failure: Secondary | ICD-10-CM | POA: Insufficient documentation

## 2017-07-14 NOTE — Progress Notes (Signed)
Cardiology Office Note:    Date:  07/15/2017   ID:  [name], DOB Mar 02, 1951, MRN [id number]  PCP:  Kelli Ada, MD  Cardiologist:  No primary care provider on file.    Referring MD: Kelli Ada, MD   Chief Complaint  Patient presents with  . Coronary Artery Disease  . Hypertension  . Hyperlipidemia  . Congestive Heart Failure    History of Present Illness:    [name] is a 67 y.o. female with a hx of with history of CAD (LAD stent 2007, CABG in 07/2006, Endeavor stent to diagonal 12/2006, cath 2010 with small vessel dz, stable cath 07/2013, s/p DES to the LIMA-LAD in 04/2016, [place] 12/31/16 due to CP showed patent LIMA-LAD, known occlusion of SVG-diagonal and normal LVF - CP felt due to GERD and was placed back on PPI),  HTN,  chronic LE (at end of the day), chronic combined CHF, probable CKD II.    She is here today for followup and is doing well.  She occasionally has some pressure on chest and depends on what she eats.  She says it is from her indigestion.  She denies any anginal CP. She denies any SOB, DOE, PND, orthopnea, LE edema, dizziness, palpitations or syncope. She ran out of her meds about 3 months ago and is now only taking ACE I, metformin, atorvastatin and zetia.  She now has new insurance that will cover her meds.   Past Medical History:  Diagnosis Date  . Anemia    "I see dr. Burr Medico @ [place]; I've had transfusion for low iron (04/17/2016)  . Anxiety   . Arthritis    "left hip, fingers"  (04/17/2016)  . Atypical chest pain    a. atypical symptoms since prior cath.  . CAD (coronary artery disease)    a. s/p 2007  LAD stent. b. s/p 07/2006 CABG with LIMA-LAD & SVG to Diag. c. 12/2006 diagonal branch w/ 2.5 x 12 mm Endeavor stent. d. s/p 2010 cath with small vessel disease treated with RX, stable by repeat 2015. e. CP/abnl nuc -> LHC 04/17/16: new ostial stenosis of LIMA-LAD s/p DES, EF 45%, chronically occ SVG-D2. f. Stable cath 12/2016.  Marland Kitchen Chronic  combined systolic and diastolic CHF (congestive heart failure) ([place])   . CKD (chronic kidney disease), stage II   . Depression   . GERD (gastroesophageal reflux disease)   . HERDEYCX(448.1)    "maybe once/month" (04/17/2016)  . Hyperlipidemia   . Hypertension   . Migraines    "couple times/yr"  ((04/17/2016)  . Myocardial infarction [facility]) ?2008  . Obesity   . OSA on CPAP    "suppose to wear mask; can't find it" (10/17//2017)  . Osteopenia 12/2010 &01/21/2013   Minimal of hip DEXA   . Pneumonia    "several years" (04/17/2016)  . Retinopathy 12/08   right  . RLS (restless legs syndrome)   . Thyroid disease    "blood work didn't show it but my dr said he can tell by looking at me; I had radiation" (04/17/2016)  . Type II diabetes mellitus ([place])     Past Surgical History:  Procedure Laterality Date  . CARDIAC CATHETERIZATION  02/03/2008   for CP patent LIMA w mid-vessel spasm.  Marland Kitchen CARDIAC CATHETERIZATION N/A 04/17/2016   Procedure: Left Heart Cath and Cors/Grafts Angiography;  Surgeon: Peter M Martinique, MD;  Location: [place] [facility];  Service: Cardiovascular;  Laterality: N/A;  . CARDIAC CATHETERIZATION N/A 04/17/2016  Procedure: Coronary Stent Intervention;  Surgeon: Peter M Martinique, MD;  Location: [place] [facility];  Service: Cardiovascular;  Laterality: N/A;  . COLONOSCOPY  10/2010   rectal polyp  . CORONARY ANGIOPLASTY WITH STENT PLACEMENT     "I've had quite a few stents"   . CORONARY ANGIOPLASTY WITH STENT PLACEMENT  04/17/2016  . CORONARY ARTERY BYPASS GRAFT  ?2008   s/p two vessel CABG off pump LAD diagonal and PTCA/DE Stent mid LAD/ DE stent LAD diagonal through previous stent sstruts,occluded SVG-diagonal  . LAPAROSCOPIC CHOLECYSTECTOMY  1990's  . LEFT HEART CATH AND CORS/GRAFTS ANGIOGRAPHY N/A 12/31/2016   Procedure: Left Heart Cath and Cors/Grafts Angiography;  Surgeon: Martinique, Peter M, MD;  Location: [place] [facility];  Service: Cardiovascular;  Laterality: N/A;  .  LEFT HEART CATHETERIZATION WITH CORONARY ANGIOGRAM N/A 07/15/2013   Procedure: LEFT HEART CATHETERIZATION WITH CORONARY ANGIOGRAM;  Surgeon: Peter M Martinique, MD;  Location: [facility] CATH LAB;  Service: Cardiovascular;  Laterality: N/A;  . TUBAL LIGATION  1976    Current Medications: Current Meds  Medication Sig  . acetaminophen (TYLENOL) 500 MG tablet Take 2 tablets (1,000 mg total) by mouth every 6 (six) hours as needed for moderate pain or headache. (Patient taking differently: Take 1,000 mg by mouth every 6 (six) hours as needed (for headaches, migraines, or pain). )  . atorvastatin (LIPITOR) 80 MG tablet TAKE 1 TABLET BY MOUTH EVERY EVENING  . ezetimibe (ZETIA) 10 MG tablet Take 1 tablet (10 mg total) by mouth daily.  Marland Kitchen lisinopril (PRINIVIL,ZESTRIL) 5 MG tablet TAKE 1 TABLET BY MOUTH EVERY DAY  . metFORMIN (GLUCOPHAGE) 1000 MG tablet TAKE 1 TABLET BY MOUTH 2 TIMES DAILY WITH A MEAL.     Allergies:   Patient has no known allergies.   Social History   Socioeconomic History  . Marital status: Married    Spouse name: None  . Number of children: None  . Years of education: None  . Highest education level: None  Social Needs  . Financial resource strain: None  . Food insecurity - worry: None  . Food insecurity - inability: None  . Transportation needs - medical: None  . Transportation needs - non-medical: None  Occupational History  . None  Tobacco Use  . Smoking status: Former Smoker    Years: 25.00    Types: Cigarettes    Last attempt to quit: 07/02/1989    Years since quitting: 28.0  . Smokeless tobacco: Never Used  . Tobacco comment: "smoked 2-3 cigarettes/week"  Substance and Sexual Activity  . Alcohol use: No  . Drug use: No  . Sexual activity: Yes  Other Topics Concern  . None  Social History Narrative  . None     Family History: The patient's family history includes Alzheimer's disease in her maternal grandmother; Cancer in her father and mother; Diabetes in her  maternal grandmother and mother; Hypertension in her mother.  ROS:   Please see the history of present illness.    Review of Systems  Respiratory: Positive for cough.   Musculoskeletal: Positive for back pain.  Neurological: Positive for headaches.    All other systems reviewed and negative.   EKGs/Labs/Other Studies Reviewed:    The following studies were reviewed today: none  EKG:  EKG is not ordered today.    Recent Labs: 01/11/2017: BUN 23; Creatinine, Ser 1.00; Potassium 4.5; Sodium 137 05/29/2017: HGB 13.2; Platelets 233   Recent Lipid Panel    Component Value Date/Time  CHOL 148 04/12/2016 0734   TRIG 208 (H) 04/12/2016 0734   HDL 39 (L) 04/12/2016 0734   CHOLHDL 3.8 04/12/2016 0734   VLDL 42 (H) 04/12/2016 0734   LDLCALC 67 04/12/2016 0734    Physical Exam:    VS:  BP (!) 145/78   Pulse 93   Ht 4' 11.5" (1.511 m)   BMI 38.75 kg/m     Wt Readings from Last 3 Encounters:  01/23/17 195 lb 1.6 oz (88.5 kg)  01/11/17 194 lb (88 kg)  12/31/16 193 lb (87.5 kg)     GEN:  Well nourished, well developed in no acute distress HEENT: Normal NECK: No JVD; No carotid bruits LYMPHATICS: No lymphadenopathy CARDIAC: RRR, no murmurs, rubs, gallops RESPIRATORY:  Clear to auscultation without rales, wheezing or rhonchi  ABDOMEN: Soft, non-tender, non-distended MUSCULOSKELETAL:  No edema; No deformity  SKIN: Warm and dry NEUROLOGIC:  Alert and oriented x 3 PSYCHIATRIC:  Normal affect   ASSESSMENT:    1. Coronary artery disease due to lipid rich plaque   2. Essential hypertension   3. Pure hypercholesterolemia   4. Chronic diastolic CHF (congestive heart failure) (H[facility])    PLAN:    In order of problems listed above:  1.  ASCAD - she has had multiple caths in the past - LAD stent 2007, CABG in 07/2006, Endeavor stent to diagonal 12/2006, cath 2010 with small vessel dz, stable cath 07/2013, s/p DES to the LIMA-LAD in 04/2016, [place] 12/31/16 due to CP showed patent  LIMA-LAD, known occlusion of SVG-diagonal and normal LVF.  Her recent bouts of chest pain were felt to be due to GERD and was placed back on PPI.  I suspect that she has microvascular angina as well given her history of abnormal nuclear stress tests.  She will continue on statin, ASA .  She cannot afford Plavix and she is more than 1 year out from PCI.  I will restart her Imdur 60mg  daily.    2.  HTN - her BP is borderline controlled on exam today.  She will continue on Lisinopril 5mg  daily.  I will restart her lopressor 25mg  BID.    3.  Hyperlipidemia - her LDL goal is < 70.  Her LDL was 82 on 07/03/2017 despite high dose statin and Zetia.  I will refer her to lipid clinic to qualify for PCSK 9 drug.   4.  Chronic diastolic CHF - she appears euvolemic on exam today.   Creatinine was stable at 0.85 on 07/03/2017.  She took herself off HCTZ as well.     Medication Adjustments/Labs and Tests Ordered: Current medicines are reviewed at length with the patient today.  Concerns regarding medicines are outlined above.  No orders of the defined types were placed in this encounter.  No orders of the defined types were placed in this encounter.   Signed, Fransico Him, MD  07/15/2017 8:30 AM    [place]

## 2017-07-15 ENCOUNTER — Encounter: Payer: Self-pay | Admitting: Cardiology

## 2017-07-15 ENCOUNTER — Ambulatory Visit (INDEPENDENT_AMBULATORY_CARE_PROVIDER_SITE_OTHER): Payer: PPO | Admitting: Cardiology

## 2017-07-15 VITALS — BP 145/78 | HR 93 | Ht 59.5 in

## 2017-07-15 DIAGNOSIS — I2510 Atherosclerotic heart disease of native coronary artery without angina pectoris: Secondary | ICD-10-CM | POA: Diagnosis not present

## 2017-07-15 DIAGNOSIS — I5032 Chronic diastolic (congestive) heart failure: Secondary | ICD-10-CM

## 2017-07-15 DIAGNOSIS — I10 Essential (primary) hypertension: Secondary | ICD-10-CM

## 2017-07-15 DIAGNOSIS — I2583 Coronary atherosclerosis due to lipid rich plaque: Secondary | ICD-10-CM | POA: Diagnosis not present

## 2017-07-15 DIAGNOSIS — E7800 Pure hypercholesterolemia, unspecified: Secondary | ICD-10-CM | POA: Diagnosis not present

## 2017-07-15 MED ORDER — ISOSORBIDE MONONITRATE ER 60 MG PO TB24: 60.0000 mg | ORAL_TABLET | Freq: Every day | ORAL | 3 refills | Status: AC

## 2017-07-15 MED ORDER — METOPROLOL TARTRATE 25 MG PO TABS: 25.0000 mg | ORAL_TABLET | Freq: Two times a day (BID) | ORAL | 3 refills | Status: AC

## 2017-07-15 NOTE — Patient Instructions (Signed)
Medication Instructions:  Your physician has recommended you make the following change in your medication:   START: Imdur 60 mg once a day START: lopressor 25 mg twice a day  If you need a refill on your cardiac medications, please contact your pharmacy first.  Labwork: None ordered   Testing/Procedures: None ordered   Follow-Up: Your physician wants you to follow-up in: 6 months with Dr. Radford Pax. You will receive a reminder letter in the mail two months in advance. If you don't receive a letter, please call our office to schedule the follow-up appointment.  Your physician recommends that you schedule a follow-up appointment in the lipid clinic.    Any Other Special Instructions Will Be Listed Below (If Applicable).     If you need a refill on your cardiac medications before your next appointment, please call your pharmacy.

## 2017-07-30 ENCOUNTER — Ambulatory Visit (INDEPENDENT_AMBULATORY_CARE_PROVIDER_SITE_OTHER): Payer: PPO | Admitting: Pharmacist

## 2017-07-30 DIAGNOSIS — E782 Mixed hyperlipidemia: Secondary | ICD-10-CM

## 2017-07-30 NOTE — Patient Instructions (Addendum)
It was nice to meet you today.  Your LDL cholesterol is very close to your goal < 70. Continue taking your atorvastatin (Lipitor) and ezetimibe (Zetia) each day.  Your triglycerides were a little high above your goal < 150. Try to switch from white bread to whole wheat or multi grain bread. Try to decrease egg intake to every other day. Also start taking fish oil 2,000mg  each day.  Come in for fasting lab work on February 26th any time after 7:30am to check your cholesterol.

## 2017-07-30 NOTE — Progress Notes (Signed)
Patient ID: [name]                 DOB: May 18, 1951                    MRN: [id number]     HPI: [name] is a 67 y.o. female patient referred to lipid clinic by Dr Radford Pax. PMH is significant for CAD (LAD stent 2007, CABG in 07/2006, Endeavor stent to diagonal 12/2006, cath 2010 with small vessel dz, stable cath 07/2013, s/p DES to the LIMA-LAD in 04/2016, [place] 12/31/16 due to CP showed patent LIMA-LAD, known occlusion of SVG-diagonal and normal LVF - CP felt due to GERD and was placed back on PPI),  HTN,  DM, chronic LE (at end of the day), chronic combined CHF, and probable CKD II.  Pt presents today in good spirits. She reports tolerating her Lipitor and Zetia well. Her most recent lipid panel reflects pt taking only Lipitor. Pt brings in all of her medications - she is taking Januvia which is not on our medication list - this has been updated. She has also not picked up her isosorbide from the pharmacy because she was not aware that a prescription was sent in at her last visit 2 weeks ago. She will pick this up.   Current Medications: Lipitor 80mg  daily, Zetia 10mg  daily Risk Factors: CAD s/p CABG and multiple stents LDL goal: 70mg /dL  Diet: Breakfast - 1 egg and white toast. Doesn't' always eat lunch. Dinner - hamburger without bread, pinto or lima beans, potatoes. Looks for 98% lean meat. Eats most meals at home. Does not drink alcohol. Once a month gets a gravy biscuit from [employer] with her husband. Drinks Diet Coke and Diet Dr Malachi Bonds. Snacks on Ruffle potato chips.    Exercise: Watches her grandchild and is constantly on the go.   Family History: The patient's family history includes Alzheimer's disease in her maternal grandmother; Cancer in her father and mother; Diabetes in her maternal grandmother and mother; Hypertension in her mother.  Social History: Former smoker for 25 years, quit in 1991.  Labs: TC 165, TG 221, HDL 39, LDL 82, non-HDL 126 (on Lipitor 80mg   daily)  Past Medical History:  Diagnosis Date  . Anemia    "I see dr. Burr Medico @ [place]; I've had transfusion for low iron (04/17/2016)  . Anxiety   . Arthritis    "left hip, fingers"  (04/17/2016)  . Atypical chest pain    a. atypical symptoms since prior cath.  . CAD (coronary artery disease)    a. s/p 2007  LAD stent. b. s/p 07/2006 CABG with LIMA-LAD & SVG to Diag. c. 12/2006 diagonal branch w/ 2.5 x 12 mm Endeavor stent. d. s/p 2010 cath with small vessel disease treated with RX, stable by repeat 2015. e. CP/abnl nuc -> LHC 04/17/16: new ostial stenosis of LIMA-LAD s/p DES, EF 45%, chronically occ SVG-D2. f. Stable cath 12/2016.  Marland Kitchen Chronic combined systolic and diastolic CHF (congestive heart failure) ([place])   . CKD (chronic kidney disease), stage II   . Depression   . GERD (gastroesophageal reflux disease)   . QVZDGLOV(564.3)    "maybe once/month" (04/17/2016)  . Hyperlipidemia   . Hypertension   . Migraines    "couple times/yr"  ((04/17/2016)  . Myocardial infarction [facility]) ?2008  . Obesity   . OSA on CPAP    "suppose to wear mask; can't find it" (10/17//2017)  . Osteopenia 12/2010 &01/21/2013  Minimal of hip DEXA   . Pneumonia    "several years" (04/17/2016)  . Retinopathy 12/08   right  . RLS (restless legs syndrome)   . Thyroid disease    "blood work didn't show it but my dr said he can tell by looking at me; I had radiation" (04/17/2016)  . Type II diabetes mellitus ([place])     Current Outpatient Medications on File Prior to Visit  Medication Sig Dispense Refill  . acetaminophen (TYLENOL) 500 MG tablet Take 2 tablets (1,000 mg total) by mouth every 6 (six) hours as needed for moderate pain or headache. (Patient taking differently: Take 1,000 mg by mouth every 6 (six) hours as needed (for headaches, migraines, or pain). )    . atorvastatin (LIPITOR) 80 MG tablet TAKE 1 TABLET BY MOUTH EVERY EVENING 90 tablet 2  . ezetimibe (ZETIA) 10 MG tablet Take 1 tablet (10 mg  total) by mouth daily. 90 tablet 3  . isosorbide mononitrate (IMDUR) 60 MG 24 hr tablet Take 1 tablet (60 mg total) by mouth daily. 90 tablet 3  . lisinopril (PRINIVIL,ZESTRIL) 5 MG tablet TAKE 1 TABLET BY MOUTH EVERY DAY 30 tablet 7  . metFORMIN (GLUCOPHAGE) 1000 MG tablet TAKE 1 TABLET BY MOUTH 2 TIMES DAILY WITH A MEAL. 60 tablet 2  . metoprolol tartrate (LOPRESSOR) 25 MG tablet Take 1 tablet (25 mg total) by mouth 2 (two) times daily. 180 tablet 3   No current facility-administered medications on file prior to visit.     No Known Allergies  Assessment/Plan:  1. Hyperlipidemia - Most recent lipid panel drawn earlier this month reflects pt only taking Lipitor 80mg  daily. Since then, she has been started on Zetia 10mg  daily. LDL at that time was 82 close to goal < 70, and non-HDL was 126 above goal < 100. TG slightly elevated at 221 above goal < 150. Pt will continue Lipitor 80mg  daily and Zetia 10mg  daily - lipid panel already scheduled for end of February. Will also start fish oil 2g daily to help lower TG. Discussed low-fat diet with pt and provided her with heart healthy dietary handout. F/u in lipid clinic as needed.   Megan E. Supple, PharmD, CPP, [facility] [address], [place], [place] [address] Phone: [phone]; Fax: [phone] 07/30/2017 9:30 AM

## 2017-08-03 DIAGNOSIS — B349 Viral infection, unspecified: Secondary | ICD-10-CM | POA: Diagnosis not present

## 2017-08-27 ENCOUNTER — Encounter (INDEPENDENT_AMBULATORY_CARE_PROVIDER_SITE_OTHER): Payer: Self-pay

## 2017-08-27 ENCOUNTER — Other Ambulatory Visit: Payer: PPO

## 2017-08-27 DIAGNOSIS — E7841 Elevated Lipoprotein(a): Secondary | ICD-10-CM | POA: Diagnosis not present

## 2017-08-27 LAB — LIPID PANEL
CHOLESTEROL TOTAL: 93 mg/dL — AB (ref 100–199)
Chol/HDL Ratio: 2.8 ratio (ref 0.0–4.4)
HDL: 33 mg/dL — AB (ref >39)
LDL Calculated: 28 mg/dL (ref 0–99)
TRIGLYCERIDES: 158 mg/dL — AB (ref 0–149)
VLDL Cholesterol Cal: 32 mg/dL (ref 5–40)

## 2017-08-27 LAB — ALT: ALT: 11 IU/L (ref 0–32)

## 2017-08-28 ENCOUNTER — Telehealth: Payer: Self-pay

## 2017-08-28 DIAGNOSIS — E785 Hyperlipidemia, unspecified: Secondary | ICD-10-CM

## 2017-08-28 NOTE — Telephone Encounter (Signed)
Notes recorded by Teressa Senter, RN on 08/28/2017 at 2:25 PM EST Patient made aware of lab results. Patient stated she has not started taking fish oil yet. Pt stated she will start taking fish oil 2 g today. Encouraged pt to limit sugar and carb intake as well. Patient in agreement with plan and scheduled for repeat labs on 05/09. Patient verbalized understanding and thankful for the call.

## 2017-08-28 NOTE — Telephone Encounter (Signed)
-----   Message from Erskine Emery, [facility] sent at 08/28/2017  2:07 PM EST ----- LDL much improved on atorvastatin and ezetimibe. TG are improved and borderline at goal <150 with addition of fish oil 2g daily (started just 1 month ago). Would recommend continue to limit sugars, carbs, and alcohol to help with TG lowering and remain on fish oil 2g daily. Repeat lipid and hepatic panel in 2-3 months.

## 2017-11-07 ENCOUNTER — Other Ambulatory Visit: Payer: PPO

## 2017-11-07 ENCOUNTER — Encounter (INDEPENDENT_AMBULATORY_CARE_PROVIDER_SITE_OTHER): Payer: Self-pay

## 2017-11-07 DIAGNOSIS — E785 Hyperlipidemia, unspecified: Secondary | ICD-10-CM

## 2017-11-07 LAB — HEPATIC FUNCTION PANEL
ALBUMIN: 4.3 g/dL (ref 3.6–4.8)
ALK PHOS: 119 IU/L — AB (ref 39–117)
ALT: 14 IU/L (ref 0–32)
AST: 13 IU/L (ref 0–40)
BILIRUBIN, DIRECT: 0.13 mg/dL (ref 0.00–0.40)
Bilirubin Total: 0.3 mg/dL (ref 0.0–1.2)
TOTAL PROTEIN: 6.1 g/dL (ref 6.0–8.5)

## 2017-11-07 LAB — LIPID PANEL
Chol/HDL Ratio: 3.1 ratio (ref 0.0–4.4)
Cholesterol, Total: 98 mg/dL — ABNORMAL LOW (ref 100–199)
HDL: 32 mg/dL — ABNORMAL LOW (ref >39)
LDL Calculated: 31 mg/dL (ref 0–99)
Triglycerides: 173 mg/dL — ABNORMAL HIGH (ref 0–149)
VLDL CHOLESTEROL CAL: 35 mg/dL (ref 5–40)

## 2017-12-06 ENCOUNTER — Telehealth: Payer: Self-pay | Admitting: Hematology

## 2017-12-06 NOTE — Telephone Encounter (Signed)
Spoke w/ patient. (YF PAL)  Rescheduled appt from 7/25 to 7/23.  Mailed patient calendar.

## 2017-12-31 ENCOUNTER — Other Ambulatory Visit (HOSPITAL_COMMUNITY)
Admission: RE | Admit: 2017-12-31 | Discharge: 2017-12-31 | Disposition: A | Discharge status: Home / Self Care | Payer: PPO | Source: Ambulatory Visit | Attending: Family Medicine | Admitting: Family Medicine

## 2017-12-31 ENCOUNTER — Other Ambulatory Visit: Payer: Self-pay | Admitting: Family Medicine

## 2017-12-31 DIAGNOSIS — E7800 Pure hypercholesterolemia, unspecified: Secondary | ICD-10-CM | POA: Diagnosis not present

## 2017-12-31 DIAGNOSIS — Z124 Encounter for screening for malignant neoplasm of cervix: Secondary | ICD-10-CM | POA: Diagnosis not present

## 2017-12-31 DIAGNOSIS — Z7984 Long term (current) use of oral hypoglycemic drugs: Secondary | ICD-10-CM | POA: Diagnosis not present

## 2017-12-31 DIAGNOSIS — I10 Essential (primary) hypertension: Secondary | ICD-10-CM | POA: Diagnosis not present

## 2017-12-31 DIAGNOSIS — E1122 Type 2 diabetes mellitus with diabetic chronic kidney disease: Secondary | ICD-10-CM | POA: Diagnosis not present

## 2017-12-31 DIAGNOSIS — Z0000 Encounter for general adult medical examination without abnormal findings: Secondary | ICD-10-CM | POA: Diagnosis not present

## 2017-12-31 DIAGNOSIS — I2510 Atherosclerotic heart disease of native coronary artery without angina pectoris: Secondary | ICD-10-CM | POA: Diagnosis not present

## 2017-12-31 DIAGNOSIS — E669 Obesity, unspecified: Secondary | ICD-10-CM | POA: Diagnosis not present

## 2017-12-31 DIAGNOSIS — M859 Disorder of bone density and structure, unspecified: Secondary | ICD-10-CM | POA: Diagnosis not present

## 2017-12-31 DIAGNOSIS — N183 Chronic kidney disease, stage 3 (moderate): Secondary | ICD-10-CM | POA: Diagnosis not present

## 2017-12-31 DIAGNOSIS — K219 Gastro-esophageal reflux disease without esophagitis: Secondary | ICD-10-CM | POA: Diagnosis not present

## 2017-12-31 DIAGNOSIS — Z6837 Body mass index (BMI) 37.0-37.9, adult: Secondary | ICD-10-CM | POA: Diagnosis not present

## 2017-12-31 DIAGNOSIS — Z1211 Encounter for screening for malignant neoplasm of colon: Secondary | ICD-10-CM | POA: Diagnosis not present

## 2017-12-31 DIAGNOSIS — Z1389 Encounter for screening for other disorder: Secondary | ICD-10-CM | POA: Diagnosis not present

## 2018-01-03 LAB — CYTOLOGY - PAP
Diagnosis: NEGATIVE
HPV: NOT DETECTED

## 2018-01-16 ENCOUNTER — Inpatient Hospital Stay: Payer: PPO | Attending: Hematology

## 2018-01-16 DIAGNOSIS — Z79899 Other long term (current) drug therapy: Secondary | ICD-10-CM | POA: Insufficient documentation

## 2018-01-16 DIAGNOSIS — M8580 Other specified disorders of bone density and structure, unspecified site: Secondary | ICD-10-CM | POA: Diagnosis not present

## 2018-01-16 DIAGNOSIS — F418 Other specified anxiety disorders: Secondary | ICD-10-CM | POA: Diagnosis not present

## 2018-01-16 DIAGNOSIS — M545 Low back pain: Secondary | ICD-10-CM | POA: Diagnosis not present

## 2018-01-16 DIAGNOSIS — D509 Iron deficiency anemia, unspecified: Secondary | ICD-10-CM | POA: Diagnosis not present

## 2018-01-16 DIAGNOSIS — E669 Obesity, unspecified: Secondary | ICD-10-CM | POA: Diagnosis not present

## 2018-01-16 DIAGNOSIS — I130 Hypertensive heart and chronic kidney disease with heart failure and stage 1 through stage 4 chronic kidney disease, or unspecified chronic kidney disease: Secondary | ICD-10-CM | POA: Diagnosis not present

## 2018-01-16 DIAGNOSIS — R0609 Other forms of dyspnea: Secondary | ICD-10-CM | POA: Insufficient documentation

## 2018-01-16 DIAGNOSIS — D500 Iron deficiency anemia secondary to blood loss (chronic): Secondary | ICD-10-CM

## 2018-01-16 DIAGNOSIS — K219 Gastro-esophageal reflux disease without esophagitis: Secondary | ICD-10-CM | POA: Diagnosis not present

## 2018-01-16 DIAGNOSIS — I2510 Atherosclerotic heart disease of native coronary artery without angina pectoris: Secondary | ICD-10-CM | POA: Diagnosis not present

## 2018-01-16 DIAGNOSIS — D508 Other iron deficiency anemias: Secondary | ICD-10-CM

## 2018-01-16 DIAGNOSIS — G4733 Obstructive sleep apnea (adult) (pediatric): Secondary | ICD-10-CM | POA: Diagnosis not present

## 2018-01-16 DIAGNOSIS — E785 Hyperlipidemia, unspecified: Secondary | ICD-10-CM | POA: Insufficient documentation

## 2018-01-16 DIAGNOSIS — E1122 Type 2 diabetes mellitus with diabetic chronic kidney disease: Secondary | ICD-10-CM | POA: Diagnosis not present

## 2018-01-16 DIAGNOSIS — Z87891 Personal history of nicotine dependence: Secondary | ICD-10-CM | POA: Diagnosis not present

## 2018-01-16 DIAGNOSIS — N182 Chronic kidney disease, stage 2 (mild): Secondary | ICD-10-CM | POA: Diagnosis not present

## 2018-01-16 DIAGNOSIS — Z7984 Long term (current) use of oral hypoglycemic drugs: Secondary | ICD-10-CM | POA: Diagnosis not present

## 2018-01-16 DIAGNOSIS — Z9989 Dependence on other enabling machines and devices: Secondary | ICD-10-CM | POA: Diagnosis not present

## 2018-01-16 DIAGNOSIS — Z951 Presence of aortocoronary bypass graft: Secondary | ICD-10-CM | POA: Insufficient documentation

## 2018-01-16 DIAGNOSIS — R197 Diarrhea, unspecified: Secondary | ICD-10-CM | POA: Insufficient documentation

## 2018-01-16 DIAGNOSIS — I252 Old myocardial infarction: Secondary | ICD-10-CM | POA: Diagnosis not present

## 2018-01-16 LAB — RETICULOCYTES
RBC.: 3.77 MIL/uL (ref 3.70–5.45)
RETIC COUNT ABSOLUTE: 86.7 10*3/uL (ref 33.7–90.7)
Retic Ct Pct: 2.3 % — ABNORMAL HIGH (ref 0.7–2.1)

## 2018-01-16 LAB — IRON AND TIBC
IRON: 57 ug/dL (ref 41–142)
SATURATION RATIOS: 18 % — AB (ref 21–57)
TIBC: 311 ug/dL (ref 236–444)
UIBC: 255 ug/dL

## 2018-01-16 LAB — CBC WITH DIFFERENTIAL (CANCER CENTER ONLY)
BASOS ABS: 0 10*3/uL (ref 0.0–0.1)
BASOS PCT: 0 %
EOS ABS: 0.1 10*3/uL (ref 0.0–0.5)
EOS PCT: 1 %
HCT: 35.2 % (ref 34.8–46.6)
HEMOGLOBIN: 11.8 g/dL (ref 11.6–15.9)
Lymphocytes Relative: 23 %
Lymphs Abs: 1.8 10*3/uL (ref 0.9–3.3)
MCH: 31.3 pg (ref 25.1–34.0)
MCHC: 33.5 g/dL (ref 31.5–36.0)
MCV: 93.4 fL (ref 79.5–101.0)
Monocytes Absolute: 0.3 10*3/uL (ref 0.1–0.9)
Monocytes Relative: 4 %
NEUTROS PCT: 72 %
Neutro Abs: 5.8 10*3/uL (ref 1.5–6.5)
PLATELETS: 220 10*3/uL (ref 145–400)
RBC: 3.77 MIL/uL (ref 3.70–5.45)
RDW: 13.2 % (ref 11.2–14.5)
WBC: 8 10*3/uL (ref 3.9–10.3)

## 2018-01-16 NOTE — Progress Notes (Signed)
[name]  [phone]) [phone] [phone]) [phone]  Clinic follow Up Note   Patient Care Team: Carol Ada, MD as PCP - General (Family Medicine) Sueanne Margarita, MD as Consulting Physician (Cardiology) 01/21/2018  CHIEF COMPLAINTS:  Follow up anemia   HISTORY OF PRESENTING ILLNESS:  [name] 67 y.o. female is here because of anemia.  She was found to have abnormal CBC since she was a child. She used to eat liver when she was a child. She does not remember her blood counts. She was not taking oral iron supplement until 6 months ago, she is on MVI with iron (18mg ) once daily. Her last CBC done by her PCP from 11/10/2014 showed WBC 10.2, normal differential, hemoglobin 10.1, hematocrit 31.0, MCV 83.9. Platelet count normal at 2 03/03/2014  She denies recent chest pain on exertion, she does have some shortness of breath on mild exertion, such as walking for a block, pre-syncopal episodes, or palpitations. She had not noticed any recent bleeding such as epistaxis, hematuria or hematochezia The patient takes aspirin 81mg  daily, no other NSAID Her last colonoscopy was sabout 5 years ago at [place], she also had EGD, which all negative per patient.  She had no prior history or diagnosis of cancer. Her last screening mammogram was several years ago.  She denies any pica and eats a variety of diet. She never donated blood or received blood transfusion She had normal period, not heavy, and LMP was over 10 years ago.   She has some pain at left shoulder blade for a few years, no limited ROM of left shoulder. She otherwise feels well overball. 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   CURRENT THERAPY: iv Feraheme 510mg  weekly X2 as needed, she received on 03/08/15, 03/15/2015, ferrous sulfate 1 tablet daily  INTERIM HISTORY  [name] returns for follow-up. She presents to the clinic today alone. She feels well. She complains of  left-sided lower back pain. She also has diarrhea in the morning. She said that it's been there for a long time and she had a colonoscopy done that didn't reveal significant GI disease. No bleeding. She states that she is not taking her iron pills.      MEDICAL HISTORY:  Past Medical History:  Diagnosis Date  . Anemia    "I see dr. Burr Medico @ [facility]; I've had transfusion for low iron (04/17/2016)  . Anxiety   . Arthritis    "left hip, fingers"  (04/17/2016)  . Atypical chest pain    a. atypical symptoms since prior cath.  . CAD (coronary artery disease)    a. s/p 2007  LAD stent. b. s/p 07/2006 CABG with LIMA-LAD & SVG to Diag. c. 12/2006 diagonal branch w/ 2.5 x 12 mm Endeavor stent. d. s/p 2010 cath with small vessel disease treated with RX, stable by repeat 2015. e. CP/abnl nuc -> LHC 04/17/16: new ostial stenosis of LIMA-LAD s/p DES, EF 45%, chronically occ SVG-D2. f. Stable cath 12/2016.  Marland Kitchen Chronic combined systolic and diastolic CHF (congestive heart failure) ([place])   . CKD (chronic kidney disease), stage II   . Depression   . GERD (gastroesophageal reflux disease)   . IRJJOACZ(660.6)    "maybe once/month" (04/17/2016)  . Hyperlipidemia   . Hypertension   . Migraines    "couple times/yr"  ((04/17/2016)  . Myocardial infarction [facility]) ?2008  . Obesity   . OSA on CPAP    "suppose to  wear mask; can't find it" (10/17//2017)  . Osteopenia 12/2010 &01/21/2013   Minimal of hip DEXA   . Pneumonia    "several years" (04/17/2016)  . Retinopathy 12/08   right  . RLS (restless legs syndrome)   . Thyroid disease    "blood work didn't show it but my dr said he can tell by looking at me; I had radiation" (04/17/2016)  . Type II diabetes mellitus ([place])     SURGICAL HISTORY: Past Surgical History:  Procedure Laterality Date  . CARDIAC CATHETERIZATION  02/03/2008   for CP patent LIMA w mid-vessel spasm.  Marland Kitchen CARDIAC CATHETERIZATION N/A 04/17/2016   Procedure: Left Heart Cath and  Cors/Grafts Angiography;  Surgeon: Peter M Martinique, MD;  Location: [place] [facility] LAB;  Service: Cardiovascular;  Laterality: N/A;  . CARDIAC CATHETERIZATION N/A 04/17/2016   Procedure: Coronary Stent Intervention;  Surgeon: Peter M Martinique, MD;  Location: [place] CV LAB;  Service: Cardiovascular;  Laterality: N/A;  . COLONOSCOPY  10/2010   rectal polyp  . CORONARY ANGIOPLASTY WITH STENT PLACEMENT     "I've had quite a few stents"   . CORONARY ANGIOPLASTY WITH STENT PLACEMENT  04/17/2016  . CORONARY ARTERY BYPASS GRAFT  ?2008   s/p two vessel CABG off pump LAD diagonal and PTCA/DE Stent mid LAD/ DE stent LAD diagonal through previous stent sstruts,occluded SVG-diagonal  . LAPAROSCOPIC CHOLECYSTECTOMY  1990's  . LEFT HEART CATH AND CORS/GRAFTS ANGIOGRAPHY N/A 12/31/2016   Procedure: Left Heart Cath and Cors/Grafts Angiography;  Surgeon: Martinique, Peter M, MD;  Location: [facility] LAB;  Service: Cardiovascular;  Laterality: N/A;  . LEFT HEART CATHETERIZATION WITH CORONARY ANGIOGRAM N/A 07/15/2013   Procedure: LEFT HEART CATHETERIZATION WITH CORONARY ANGIOGRAM;  Surgeon: Peter M Martinique, MD;  Location: [facility] CATH LAB;  Service: Cardiovascular;  Laterality: N/A;  . TUBAL LIGATION  1976    SOCIAL HISTORY: Social History   Socioeconomic History  . Marital status: Married    Spouse name: Not on file  . Number of children: Not on file  . Years of education: Not on file  . Highest education level: Not on file  Occupational History  . Not on file  Social Needs  . Financial resource strain: Not on file  . Food insecurity:    Worry: Not on file    Inability: Not on file  . Transportation needs:    Medical: Not on file    Non-medical: Not on file  Tobacco Use  . Smoking status: Former Smoker    Years: 25.00    Types: Cigarettes    Last attempt to quit: 07/02/1989    Years since quitting: 28.5  . Smokeless tobacco: Never Used  . Tobacco comment: "smoked 2-3 cigarettes/week"  Substance and  Sexual Activity  . Alcohol use: No  . Drug use: No  . Sexual activity: Yes  Lifestyle  . Physical activity:    Days per week: Not on file    Minutes per session: Not on file  . Stress: Not on file  Relationships  . Social connections:    Talks on phone: Not on file    Gets together: Not on file    Attends religious service: Not on file    Active member of club or organization: Not on file    Attends meetings of clubs or organizations: Not on file    Relationship status: Not on file  . Intimate partner violence:    Fear of current or ex partner: Not  on file    Emotionally abused: Not on file    Physically abused: Not on file    Forced sexual activity: Not on file  Other Topics Concern  . Not on file  Social History Narrative  . Not on file    FAMILY HISTORY: Family History  Problem Relation Age of Onset  . Cancer Father        Lung  . Hypertension Mother   . Cancer Mother        Lung  . Diabetes Mother   . Diabetes Maternal Grandmother   . Alzheimer's disease Maternal Grandmother     ALLERGIES:  has No Known Allergies.  MEDICATIONS:  Current Outpatient Medications  Medication Sig Dispense Refill  . acetaminophen (TYLENOL) 500 MG tablet Take 2 tablets (1,000 mg total) by mouth every 6 (six) hours as needed for moderate pain or headache. (Patient taking differently: Take 1,000 mg by mouth every 6 (six) hours as needed (for headaches, migraines, or pain). )    . atorvastatin (LIPITOR) 80 MG tablet TAKE 1 TABLET BY MOUTH EVERY EVENING (Patient taking differently: take every morning) 90 tablet 2  . ezetimibe (ZETIA) 10 MG tablet Take 1 tablet (10 mg total) by mouth daily. 90 tablet 3  . isosorbide mononitrate (IMDUR) 60 MG 24 hr tablet Take 1 tablet (60 mg total) by mouth daily. 90 tablet 3  . lisinopril (PRINIVIL,ZESTRIL) 5 MG tablet TAKE 1 TABLET BY MOUTH EVERY DAY 30 tablet 7  . metFORMIN (GLUCOPHAGE) 1000 MG tablet TAKE 1 TABLET BY MOUTH 2 TIMES DAILY WITH A MEAL. 60  tablet 2  . metFORMIN (GLUCOPHAGE) 500 MG tablet Take 500 mg by mouth at bedtime.    . metoprolol tartrate (LOPRESSOR) 25 MG tablet Take 1 tablet (25 mg total) by mouth 2 (two) times daily. 180 tablet 3  . Omega-3 Fatty Acids (FISH OIL) 1000 MG CAPS Take 2 capsules (2,000 mg total) by mouth daily.  0  . sitaGLIPtin (JANUVIA) 100 MG tablet Take 100 mg by mouth daily.     No current facility-administered medications for this visit.     REVIEW OF SYSTEMS:  Constitutional: Denies fevers, chills or abnormal night sweats Eyes: Denies blurriness of vision, double vision or watery eyes Ears, nose, mouth, throat, and face: Denies mucositis or sore throat Respiratory: Denies cough, dyspnea or wheezes Cardiovascular: Denies palpitation, chest discomfort or lower extremity swelling Gastrointestinal:  Denies nausea, heartburn or change  (+) diarrhea due to medication Skin: Denies abnormal skin rashes Lymphatics: Denies new lymphadenopathy or easy bruising Neurological:Denies numbness, tingling or new weaknesses Behavioral/Psych: Mood is stable, no new changes  All other systems were reviewed with the patient and are negative.  PHYSICAL EXAMINATION: ECOG PERFORMANCE STATUS: 0  Vitals:   01/21/18 0924  BP: 134/62  Pulse: 95  Resp: 18  Temp: 98.4 F (36.9 C)  SpO2: 98%   Filed Weights   01/21/18 0924  Weight: 191 lb 8 oz (86.9 kg)    GENERAL:alert, no distress and comfortable SKIN: skin color, texture, turgor are normal, no rashes or significant lesions, except a few ecchymosis on her left arm and right leg.  EYES: normal, conjunctiva are pink and non-injected, sclera clear OROPHARYNX:no exudate, no erythema and lips, buccal mucosa, and tongue normal  NECK: supple, thyroid normal size, non-tender, without nodularity LYMPH:  no palpable lymphadenopathy in the cervical, axillary or inguinal LUNGS: clear to auscultation and percussion with normal breathing effort, scatter crackers on  bilaterally lung basis HEART: regular  rate & rhythm and no murmurs and no lower extremity edema ABDOMEN:abdomen soft, normal bowel sounds, (+) mild tenderness at the left upper quadrant of abdomen, no organomegaly. Musculoskeletal:no cyanosis of digits and no clubbing, (+)  bilateral pitting edema (+) left sided LQ back pain  PSYCH: alert & oriented x 3 with fluent speech NEURO: no focal motor/sensory deficits  LABORATORY DATA:  I have reviewed the data as listed CBC Latest Ref Rng & Units 01/16/2018 05/29/2017 01/16/2017  WBC 3.9 - 10.3 K/uL 8.0 9.9 12.7(H)  Hemoglobin 11.6 - 15.9 g/dL 11.8 13.2 13.3  Hematocrit 34.8 - 46.6 % 35.2 39.0 39.4  Platelets 145 - 400 K/uL 220 233 200    CMP Latest Ref Rng & Units 11/07/2017 08/27/2017 01/11/2017  Glucose 65 - 99 mg/dL - - 282(H)  BUN 8 - 27 mg/dL - - 23  Creatinine 0.57 - 1.00 mg/dL - - 1.00  Sodium 134 - 144 mmol/L - - 137  Potassium 3.5 - 5.2 mmol/L - - 4.5  Chloride 96 - 106 mmol/L - - 96  CO2 20 - 29 mmol/L - - 24  Calcium 8.7 - 10.3 mg/dL - - 9.5  Total Protein 6.0 - 8.5 g/dL 6.1 - -  Total Bilirubin 0.0 - 1.2 mg/dL 0.3 - -  Alkaline Phos 39 - 117 IU/L 119(H) - -  AST 0 - 40 IU/L 13 - -  ALT 0 - 32 IU/L 14 11 -   CBC    Component Value Date/Time   WBC 8.0 01/16/2018 0902   WBC 9.9 05/29/2017 0939   WBC 9.9 11/06/2016 1139   RBC 3.77 01/16/2018 0902   RBC 3.77 01/16/2018 0902   HGB 11.8 01/16/2018 0902   HGB 13.2 05/29/2017 0939   HCT 35.2 01/16/2018 0902   HCT 39.0 05/29/2017 0939   PLT 220 01/16/2018 0902   PLT 233 05/29/2017 0939   PLT 220 12/28/2016 1624   MCV 93.4 01/16/2018 0902   MCV 91.1 05/29/2017 0939   MCH 31.3 01/16/2018 0902   MCHC 33.5 01/16/2018 0902   RDW 13.2 01/16/2018 0902   RDW 12.9 05/29/2017 0939   LYMPHSABS 1.8 01/16/2018 0902   LYMPHSABS 2.3 05/29/2017 0939   MONOABS 0.3 01/16/2018 0902   MONOABS 0.3 05/29/2017 0939   EOSABS 0.1 01/16/2018 0902   EOSABS 0.1 05/29/2017 0939   EOSABS 0.2  12/28/2016 1624   BASOSABS 0.0 01/16/2018 0902   BASOSABS 0.0 05/29/2017 0939   Iron/TIBC/Ferritin/ %Sat    Component Value Date/Time   IRON 57 01/16/2018 0902   IRON 76 05/29/2017 0939   TIBC 311 01/16/2018 0902   TIBC 293 05/29/2017 0939   FERRITIN 100 05/29/2017 0939   IRONPCTSAT 18 (L) 01/16/2018 0902   IRONPCTSAT 26 05/29/2017 0939      RADIOGRAPHIC STUDIES: I have personally reviewed the radiological images as listed and agreed with the findings in the report. No results found.  ASSESSMENT & PLAN:  67 y.o. Caucasian female, with long-standing history of anemia since childhood (per patient), was referred for anemia evaluation.  1. Iron deficient anemia  -She previously had mild anemia was normal MCV and low reticulocyte count. -She previously had low serum iron level at 34, low transferrin saturation 9%, normal TIBC 372, ferritin 11. This is supportive for iron deficiency anemia, but her anemia is slightly outer portion of her iron deficiency. She may have anemia of chronic disease also. -Her folic acid and V89 level were previously normal, SPEP were negative for M  protein, TSH was normal, erythropoietin was elevated at 44.8, sedimentation rate was normal, and hemoglobin electrophoresis was normal. -She previously responded very well to IV Feraheme, her anemia has resolved. -The etiology of iron deficient anemia is not clear, she does have mild diarrhea, left upper quadrant abdominal tenderness, her last colonoscopy was 5 years ago, done at [facility]. I previously refered her back for GI workup including repeated endoscopy, which is she has not been able to see them due to her remaining balance.  -I reviewed her lab results with her. Her Hg is 11.8, slightly lower than before.  Her iron saturation is also slightly decreased, she was previously on oral iron pill, stopped a while ago.  I recommend her to restart oral iron every other day -She will continue follow-up with her primary  care physician with lab, I will see her back in 1 year with interim labs in 6 months.  2. Diabetes, CAD,  -She'll continue follow-up with her primary care physician and her cardiologist Dr. Heron Nay -Her sugar has been high lately.  -She takes Metformin 1000mg  twice a day, but needs a refill but could not get it because of a bill.    Plan -Lab in 6 months  -F/u in one year with lab one week before   All questions were answered. The patient knows to call the clinic with any problems, questions or concerns.  I spent 15 minutes counseling the patient face to face. The total time spent in the appointment was 20 minutes and more than 50% was on counseling.  Dierdre Searles Dweik am acting as scribe for Dr. Truitt Merle.  I have reviewed the above documentation for accuracy and completeness, and I agree with the above.    Truitt Merle, MD 01/21/2018

## 2018-01-21 ENCOUNTER — Inpatient Hospital Stay (HOSPITAL_BASED_OUTPATIENT_CLINIC_OR_DEPARTMENT_OTHER): Payer: PPO | Admitting: Hematology

## 2018-01-21 ENCOUNTER — Telehealth: Payer: Self-pay | Admitting: Hematology

## 2018-01-21 ENCOUNTER — Encounter: Payer: Self-pay | Admitting: Hematology

## 2018-01-21 VITALS — BP 134/62 | HR 95 | Temp 98.4°F | Resp 18 | Ht 59.5 in | Wt 191.5 lb

## 2018-01-21 DIAGNOSIS — Z951 Presence of aortocoronary bypass graft: Secondary | ICD-10-CM | POA: Diagnosis not present

## 2018-01-21 DIAGNOSIS — R0609 Other forms of dyspnea: Secondary | ICD-10-CM

## 2018-01-21 DIAGNOSIS — K219 Gastro-esophageal reflux disease without esophagitis: Secondary | ICD-10-CM | POA: Diagnosis not present

## 2018-01-21 DIAGNOSIS — I2510 Atherosclerotic heart disease of native coronary artery without angina pectoris: Secondary | ICD-10-CM | POA: Diagnosis not present

## 2018-01-21 DIAGNOSIS — Z9989 Dependence on other enabling machines and devices: Secondary | ICD-10-CM

## 2018-01-21 DIAGNOSIS — F418 Other specified anxiety disorders: Secondary | ICD-10-CM

## 2018-01-21 DIAGNOSIS — N182 Chronic kidney disease, stage 2 (mild): Secondary | ICD-10-CM

## 2018-01-21 DIAGNOSIS — D509 Iron deficiency anemia, unspecified: Secondary | ICD-10-CM

## 2018-01-21 DIAGNOSIS — E1122 Type 2 diabetes mellitus with diabetic chronic kidney disease: Secondary | ICD-10-CM

## 2018-01-21 DIAGNOSIS — R197 Diarrhea, unspecified: Secondary | ICD-10-CM | POA: Diagnosis not present

## 2018-01-21 DIAGNOSIS — I252 Old myocardial infarction: Secondary | ICD-10-CM

## 2018-01-21 DIAGNOSIS — Z7984 Long term (current) use of oral hypoglycemic drugs: Secondary | ICD-10-CM

## 2018-01-21 DIAGNOSIS — Z87891 Personal history of nicotine dependence: Secondary | ICD-10-CM

## 2018-01-21 DIAGNOSIS — Z79899 Other long term (current) drug therapy: Secondary | ICD-10-CM

## 2018-01-21 DIAGNOSIS — G4733 Obstructive sleep apnea (adult) (pediatric): Secondary | ICD-10-CM

## 2018-01-21 DIAGNOSIS — M8580 Other specified disorders of bone density and structure, unspecified site: Secondary | ICD-10-CM

## 2018-01-21 DIAGNOSIS — E785 Hyperlipidemia, unspecified: Secondary | ICD-10-CM | POA: Diagnosis not present

## 2018-01-21 DIAGNOSIS — I130 Hypertensive heart and chronic kidney disease with heart failure and stage 1 through stage 4 chronic kidney disease, or unspecified chronic kidney disease: Secondary | ICD-10-CM

## 2018-01-21 DIAGNOSIS — E669 Obesity, unspecified: Secondary | ICD-10-CM

## 2018-01-21 DIAGNOSIS — M545 Low back pain: Secondary | ICD-10-CM | POA: Diagnosis not present

## 2018-01-21 NOTE — Telephone Encounter (Signed)
Gave patient avs and calendar of upcoming appts.  °

## 2018-01-23 ENCOUNTER — Ambulatory Visit: Payer: Medicare Other | Admitting: Hematology

## 2018-02-10 ENCOUNTER — Other Ambulatory Visit: Payer: Self-pay | Admitting: *Deleted

## 2018-02-10 NOTE — Patient Outreach (Signed)
[facility]) Care Management  02/10/2018  Marlyss Cissell [birth year] [phone]   Member identified as high risk according to [employer] health questionnaire.  Call placed to introduce [facility] care management services and perform telephone screening.  This care manager introduced self and purpose of call.  Screening complete, member denies any needs at this time, will not open case.  Valente David, [place], MSN [place] [phone]

## 2018-02-25 DIAGNOSIS — M8588 Other specified disorders of bone density and structure, other site: Secondary | ICD-10-CM | POA: Diagnosis not present

## 2018-04-28 ENCOUNTER — Other Ambulatory Visit: Payer: PPO

## 2018-07-22 ENCOUNTER — Inpatient Hospital Stay: Payer: Medicare Other | Attending: Family Medicine

## 2018-07-22 DIAGNOSIS — D500 Iron deficiency anemia secondary to blood loss (chronic): Secondary | ICD-10-CM

## 2018-07-22 DIAGNOSIS — D509 Iron deficiency anemia, unspecified: Secondary | ICD-10-CM | POA: Diagnosis not present

## 2018-07-22 DIAGNOSIS — D508 Other iron deficiency anemias: Secondary | ICD-10-CM

## 2018-07-22 LAB — RETICULOCYTES
Immature Retic Fract: 14.6 % (ref 2.3–15.9)
RBC.: 3.79 MIL/uL — ABNORMAL LOW (ref 3.87–5.11)
RETIC COUNT ABSOLUTE: 66.7 10*3/uL (ref 19.0–186.0)
Retic Ct Pct: 1.8 % (ref 0.4–3.1)

## 2018-07-22 LAB — FERRITIN: Ferritin: 108 ng/mL (ref 11–307)

## 2018-07-22 LAB — IRON AND TIBC
Iron: 37 ug/dL — ABNORMAL LOW (ref 41–142)
Saturation Ratios: 14 % — ABNORMAL LOW (ref 21–57)
TIBC: 258 ug/dL (ref 236–444)
UIBC: 220 ug/dL (ref 120–384)

## 2018-07-22 LAB — CBC WITH DIFFERENTIAL (CANCER CENTER ONLY)
ABS IMMATURE GRANULOCYTES: 0.03 10*3/uL (ref 0.00–0.07)
BASOS PCT: 0 %
Basophils Absolute: 0 10*3/uL (ref 0.0–0.1)
Eosinophils Absolute: 0.1 10*3/uL (ref 0.0–0.5)
Eosinophils Relative: 2 %
HCT: 34.5 % — ABNORMAL LOW (ref 36.0–46.0)
Hemoglobin: 11.8 g/dL — ABNORMAL LOW (ref 12.0–15.0)
IMMATURE GRANULOCYTES: 1 %
Lymphocytes Relative: 22 %
Lymphs Abs: 1.4 10*3/uL (ref 0.7–4.0)
MCH: 31.1 pg (ref 26.0–34.0)
MCHC: 34.2 g/dL (ref 30.0–36.0)
MCV: 91 fL (ref 80.0–100.0)
Monocytes Absolute: 0.4 10*3/uL (ref 0.1–1.0)
Monocytes Relative: 5 %
NEUTROS ABS: 4.7 10*3/uL (ref 1.7–7.7)
Neutrophils Relative %: 70 %
PLATELETS: 180 10*3/uL (ref 150–400)
RBC: 3.79 MIL/uL — ABNORMAL LOW (ref 3.87–5.11)
RDW: 12.8 % (ref 11.5–15.5)
WBC Count: 6.7 10*3/uL (ref 4.0–10.5)
nRBC: 0 % (ref 0.0–0.2)

## 2018-07-28 ENCOUNTER — Telehealth: Payer: Self-pay

## 2018-07-28 NOTE — Telephone Encounter (Signed)
Spoke with patient regarding lab results.  Per Dr. Burr Medico iron level is low, patient states she has not been taking the ferrous sulfate, agreed to start back taking one daily.

## 2018-07-28 NOTE — Telephone Encounter (Signed)
-----   Message from Truitt Merle, MD sent at 07/23/2018 10:08 PM EST ----- Please let her know the lab result, iron level is still slightly low,mild anemia is stable, continue oral iron, and increase 1 tab if she can tolerates, thanks   Truitt Merle  07/23/2018

## 2019-01-12 ENCOUNTER — Inpatient Hospital Stay: Payer: Medicare Other

## 2019-01-12 ENCOUNTER — Telehealth: Payer: Self-pay | Admitting: Hematology

## 2019-01-12 NOTE — Telephone Encounter (Signed)
YF PAL 7/20 moved f/u to 7/14 and also moved 7/13 missed lab to 7/14. Confirmed with dtr.

## 2019-01-12 NOTE — Progress Notes (Signed)
[facility]   [phone] [phone]   Clinic Follow up Note   Patient Care Team: Carol Ada, MD as PCP - General (Family Medicine) Sueanne Margarita, MD as Consulting Physician (Cardiology)  Date of Service:  01/13/2019  CHIEF COMPLAINT: Follow up anemia   CURRENT THERAPY:  iv Feraheme 510mg  weekly X2 as needed, she received on 03/08/15, 03/15/2015 Oral ferrous sulfate 1 tablet daily  INTERVAL HISTORY:  [name] is here for a follow up anemia. She was last seen by me 1 year ago. She presents to the clinic alone. She notes she is doing well. She notes she feels tired but still goes to bed early and gets up early. She notes she still gets her activities done as usual. She notes some SOB and occasional chest pain, but after sitting down for a few minutes this resolves. She has only had to take nitroglycerin twice. She feels the chest pain does happen more often than before. She has CAD with stent placed. She has not seen her Cardiologist in 1.5 years.   She takes oral iron once a day. She denies blood in stool or any other bleeding. She has not seen [facility] in while. She notes she has been gaining weight. She notes her right LE will sell at night. She also notes swelling of her hands. She did have vein graft from right leg for past cardiac surgery. She will see her PCP next months. She notes her DM has improved since her last A1c.     REVIEW OF SYSTEMS:   Constitutional: Denies fevers, chills or abnormal weight loss (+) weight gain  Eyes: Denies blurriness of vision Ears, nose, mouth, throat, and face: Denies mucositis or sore throat Respiratory: Denies cough or wheezes (+) mild SOB  Cardiovascular: Denies palpitation or lower extremity swelling (+) Occasional chest pain  Gastrointestinal:  Denies nausea, heartburn or change in bowel habits Skin: Denies abnormal skin rashes Lymphatics: Denies new lymphadenopathy or easy  bruising Neurological:Denies numbness, tingling or new weaknesses Behavioral/Psych: Mood is stable, no new changes  All other systems were reviewed with the patient and are negative.  MEDICAL HISTORY:  Past Medical History:  Diagnosis Date   Anemia    "I see dr. Burr Medico @ [place]; I've had transfusion for low iron (04/17/2016)   Anxiety    Arthritis    "left hip, fingers"  (04/17/2016)   Atypical chest pain    a. atypical symptoms since prior cath.   CAD (coronary artery disease)    a. s/p 2007  LAD stent. b. s/p 07/2006 CABG with LIMA-LAD & SVG to Diag. c. 12/2006 diagonal branch w/ 2.5 x 12 mm Endeavor stent. d. s/p 2010 cath with small vessel disease treated with RX, stable by repeat 2015. e. CP/abnl nuc -> LHC 04/17/16: new ostial stenosis of LIMA-LAD s/p DES, EF 45%, chronically occ SVG-D2. f. Stable cath 12/2016.   Chronic combined systolic and diastolic CHF (congestive heart failure) (HCC)    CKD (chronic kidney disease), stage II    Depression    GERD (gastroesophageal reflux disease)    Headache(784.0)    "maybe once/month" (04/17/2016)   Hyperlipidemia    Hypertension    Migraines    "couple times/yr"  ((04/17/2016)   Myocardial infarction [facility]) ?2008   Obesity    OSA on CPAP    "suppose to wear mask; can't find it" (10/17//2017)   Osteopenia 12/2010 &01/21/2013   Minimal of hip DEXA  Pneumonia    "several years" (04/17/2016)   Retinopathy 12/08   right   RLS (restless legs syndrome)    Thyroid disease    "blood work didn't show it but my dr said he can tell by looking at me; I had radiation" (04/17/2016)   Type II diabetes mellitus ([place])     SURGICAL HISTORY: Past Surgical History:  Procedure Laterality Date   CARDIAC CATHETERIZATION  02/03/2008   for CP patent LIMA w mid-vessel spasm.   CARDIAC CATHETERIZATION N/A 04/17/2016   Procedure: Left Heart Cath and Cors/Grafts Angiography;  Surgeon: Peter M Martinique, MD;  Location: [place]  CV LAB;  Service: Cardiovascular;  Laterality: N/A;   CARDIAC CATHETERIZATION N/A 04/17/2016   Procedure: Coronary Stent Intervention;  Surgeon: Peter M Martinique, MD;  Location: [place] [facility];  Service: Cardiovascular;  Laterality: N/A;   COLONOSCOPY  10/2010   rectal polyp   CORONARY ANGIOPLASTY WITH STENT PLACEMENT     "I've had quite a few stents"    CORONARY ANGIOPLASTY WITH STENT PLACEMENT  04/17/2016   CORONARY ARTERY BYPASS GRAFT  ?2008   s/p two vessel CABG off pump LAD diagonal and PTCA/DE Stent mid LAD/ DE stent LAD diagonal through previous stent sstruts,occluded SVG-diagonal   LAPAROSCOPIC CHOLECYSTECTOMY  1990's   LEFT HEART CATH AND CORS/GRAFTS ANGIOGRAPHY N/A 12/31/2016   Procedure: Left Heart Cath and Cors/Grafts Angiography;  Surgeon: Martinique, Peter M, MD;  Location: [place] [facility];  Service: Cardiovascular;  Laterality: N/A;   LEFT HEART CATHETERIZATION WITH CORONARY ANGIOGRAM N/A 07/15/2013   Procedure: LEFT HEART CATHETERIZATION WITH CORONARY ANGIOGRAM;  Surgeon: Peter M Martinique, MD;  Location: [facility] CATH LAB;  Service: Cardiovascular;  Laterality: N/A;   TUBAL LIGATION  1976    I have reviewed the social history and family history with the patient and they are unchanged from previous note.  ALLERGIES:  has No Known Allergies.  MEDICATIONS:  Current Outpatient Medications  Medication Sig Dispense Refill   acetaminophen (TYLENOL) 500 MG tablet Take 2 tablets (1,000 mg total) by mouth every 6 (six) hours as needed for moderate pain or headache. (Patient taking differently: Take 1,000 mg by mouth every 6 (six) hours as needed (for headaches, migraines, or pain). )     atorvastatin (LIPITOR) 80 MG tablet TAKE 1 TABLET BY MOUTH EVERY EVENING (Patient taking differently: take every morning) 90 tablet 2   ferrous sulfate 325 (65 FE) MG EC tablet Take 325 mg by mouth 3 (three) times daily with meals.     glimepiride (AMARYL) 4 MG tablet      lisinopril  (PRINIVIL,ZESTRIL) 5 MG tablet TAKE 1 TABLET BY MOUTH EVERY DAY 30 tablet 7   metFORMIN (GLUCOPHAGE) 1000 MG tablet TAKE 1 TABLET BY MOUTH 2 TIMES DAILY WITH A MEAL. 60 tablet 2   ezetimibe (ZETIA) 10 MG tablet Take 1 tablet (10 mg total) by mouth daily. 90 tablet 3   isosorbide mononitrate (IMDUR) 60 MG 24 hr tablet Take 1 tablet (60 mg total) by mouth daily. 90 tablet 3   metoprolol tartrate (LOPRESSOR) 25 MG tablet Take 1 tablet (25 mg total) by mouth 2 (two) times daily. 180 tablet 3   No current facility-administered medications for this visit.     PHYSICAL EXAMINATION: ECOG PERFORMANCE STATUS: 1   Vitals:   01/13/19 1007  BP: (!) 162/61  Pulse: 82  Resp: 18  Temp: 98.9 F (37.2 C)  SpO2: 99%   Filed Weights   01/13/19 1007  Weight: 203 lb 1.6 oz (92.1 kg)    GENERAL:alert, no distress and comfortable SKIN: skin color, texture, turgor are normal, no rashes or significant lesions EYES: normal, Conjunctiva are pink and non-injected, sclera clear  NECK: supple, thyroid normal size, non-tender, without nodularity LYMPH:  no palpable lymphadenopathy in the cervical, axillary  LUNGS: clear to auscultation and percussion with normal breathing effort HEART: regular rate & rhythm and no murmurs (+) B/l lower extremity edema, R>L ABDOMEN:abdomen soft, non-tender and normal bowel sounds (+) LUQ tenderness  Musculoskeletal:no cyanosis of digits and no clubbing  NEURO: alert & oriented x 3 with fluent speech, no focal motor/sensory deficits  LABORATORY DATA:  I have reviewed the data as listed CBC Latest Ref Rng & Units 01/13/2019 07/22/2018 01/16/2018  WBC 4.0 - 10.5 K/uL 7.7 6.7 8.0  Hemoglobin 12.0 - 15.0 g/dL 11.6(L) 11.8(L) 11.8  Hematocrit 36.0 - 46.0 % 34.8(L) 34.5(L) 35.2  Platelets 150 - 400 K/uL 228 180 220     CMP Latest Ref Rng & Units 11/07/2017 08/27/2017 01/11/2017  Glucose 65 - 99 mg/dL - - 282(H)  BUN 8 - 27 mg/dL - - 23  Creatinine 0.57 - 1.00 mg/dL - - 1.00   Sodium 134 - 144 mmol/L - - 137  Potassium 3.5 - 5.2 mmol/L - - 4.5  Chloride 96 - 106 mmol/L - - 96  CO2 20 - 29 mmol/L - - 24  Calcium 8.7 - 10.3 mg/dL - - 9.5  Total Protein 6.0 - 8.5 g/dL 6.1 - -  Total Bilirubin 0.0 - 1.2 mg/dL 0.3 - -  Alkaline Phos 39 - 117 IU/L 119(H) - -  AST 0 - 40 IU/L 13 - -  ALT 0 - 32 IU/L 14 11 -   Results for ASHELEY, HELLBERG (MRN [id number]) as of 01/13/2019 17:07  Ref. Range 01/16/2018 09:02 07/22/2018 10:49 01/13/2019 09:47  Iron Latest Ref Range: 41 - 142 ug/dL 57 37 (L) 61  UIBC Latest Ref Range: 120 - 384 ug/dL 255 220 266  TIBC Latest Ref Range: 236 - 444 ug/dL 311 258 326  Saturation Ratios Latest Ref Range: 21 - 57 % 18 (L) 14 (L) 19 (L)  Ferritin Latest Ref Range: 11 - 307 ng/mL  108 85    RADIOGRAPHIC STUDIES: I have personally reviewed the radiological images as listed and agreed with the findings in the report. No results found.   ASSESSMENT & PLAN:  [name] is a 68 y.o. female with   1. Iron deficient anemia and anemia of chronic disease  -She previously had mild anemia was normal MCV and low reticulocyte count and low serum iron level at 34, low transferrin saturation 9%, normal TIBC 372, ferritin 11. This is supportive for iron deficiency anemia, but her anemia is slightly out of proportion of her iron deficiency. She may have anemia of chronic disease also. -Her folic acid and O27 level were previously normal, SPEP were negative for M protein, TSH was normal, erythropoietin was elevated at 44.8, sedimentation rate was normal, and hemoglobin electrophoresis was normal. -She previously responded very well to IV Feraheme, her anemia had resolved. -The etiology of iron deficient anemia is not clear, she does have mild diarrhea, left upper quadrant abdominal tenderness, her last colonoscopy was over 5 years ago, done at [place]. -She is currently on oral iron once daily.  -She is clinically stable. Physical exam still showed LUQ  tenderness, no organomegaly. Labs reviewed, CBC WNL except Hg 11.6. Immature  retic 26.7. ferritin 85 today with low transferrin saturation. Given her slightly low iron level, and mild anemia, despite oral iron, will give one dose iv iron next week.  -I again recommend she return to her GI for repeated endoscopy/colonoscopy. I will f/u with them as well.  -f/u in 1 year or sooner if needed.   2. Diabetes, CAD, S/p double bypass surgery and stent placements  -She'll continue follow-up with her primary care physician and her cardiologist Dr. Heron Nay -She takes Metformin 1000mg  twice a day  -She has been having mild SOB and occasional chest pain, which has been more often. I recommend she f/u with her cardiologist soon.    3. Weight gain, LE edema   -She has been gained almost 20 pounds lately. She notes she continues to weight herself every morning.  -She also has occasional LE swelling R>L and swelling of hands.    Plan -iv iron next week -Lab in 6 months  -F/u in one year with lab  -will copy her PCP and Dr. Heron Nay    No problem-specific Assessment & Plan notes found for this encounter.   No orders of the defined types were placed in this encounter.  All questions were answered. The patient knows to call the clinic with any problems, questions or concerns. No barriers to learning was detected. I spent 15 minutes counseling the patient face to face. The total time spent in the appointment was 20 minutes and more than 50% was on counseling and review of test results     Truitt Merle, MD 01/13/2019   I, Joslyn Devon, am acting as scribe for Truitt Merle, MD.   I have reviewed the above documentation for accuracy and completeness, and I agree with the above.

## 2019-01-13 ENCOUNTER — Other Ambulatory Visit: Payer: Self-pay

## 2019-01-13 ENCOUNTER — Inpatient Hospital Stay: Payer: Medicare Other

## 2019-01-13 ENCOUNTER — Inpatient Hospital Stay: Payer: Medicare Other | Attending: Hematology | Admitting: Hematology

## 2019-01-13 ENCOUNTER — Encounter: Payer: Self-pay | Admitting: Hematology

## 2019-01-13 VITALS — BP 162/61 | HR 82 | Temp 98.9°F | Resp 18 | Ht 59.5 in | Wt 203.1 lb

## 2019-01-13 DIAGNOSIS — E119 Type 2 diabetes mellitus without complications: Secondary | ICD-10-CM

## 2019-01-13 DIAGNOSIS — Z7984 Long term (current) use of oral hypoglycemic drugs: Secondary | ICD-10-CM | POA: Diagnosis not present

## 2019-01-13 DIAGNOSIS — E079 Disorder of thyroid, unspecified: Secondary | ICD-10-CM

## 2019-01-13 DIAGNOSIS — D508 Other iron deficiency anemias: Secondary | ICD-10-CM | POA: Diagnosis not present

## 2019-01-13 DIAGNOSIS — D500 Iron deficiency anemia secondary to blood loss (chronic): Secondary | ICD-10-CM

## 2019-01-13 DIAGNOSIS — Z79899 Other long term (current) drug therapy: Secondary | ICD-10-CM

## 2019-01-13 DIAGNOSIS — D509 Iron deficiency anemia, unspecified: Secondary | ICD-10-CM

## 2019-01-13 LAB — CBC WITH DIFFERENTIAL (CANCER CENTER ONLY)
Abs Immature Granulocytes: 0.06 10*3/uL (ref 0.00–0.07)
Basophils Absolute: 0 10*3/uL (ref 0.0–0.1)
Basophils Relative: 0 %
Eosinophils Absolute: 0.2 10*3/uL (ref 0.0–0.5)
Eosinophils Relative: 2 %
HCT: 34.8 % — ABNORMAL LOW (ref 36.0–46.0)
Hemoglobin: 11.6 g/dL — ABNORMAL LOW (ref 12.0–15.0)
Immature Granulocytes: 1 %
Lymphocytes Relative: 23 %
Lymphs Abs: 1.7 10*3/uL (ref 0.7–4.0)
MCH: 31.5 pg (ref 26.0–34.0)
MCHC: 33.3 g/dL (ref 30.0–36.0)
MCV: 94.6 fL (ref 80.0–100.0)
Monocytes Absolute: 0.4 10*3/uL (ref 0.1–1.0)
Monocytes Relative: 5 %
Neutro Abs: 5.4 10*3/uL (ref 1.7–7.7)
Neutrophils Relative %: 69 %
Platelet Count: 228 10*3/uL (ref 150–400)
RBC: 3.68 MIL/uL — ABNORMAL LOW (ref 3.87–5.11)
RDW: 12.5 % (ref 11.5–15.5)
WBC Count: 7.7 10*3/uL (ref 4.0–10.5)
nRBC: 0 % (ref 0.0–0.2)

## 2019-01-13 LAB — IRON AND TIBC
Iron: 61 ug/dL (ref 41–142)
Saturation Ratios: 19 % — ABNORMAL LOW (ref 21–57)
TIBC: 326 ug/dL (ref 236–444)
UIBC: 266 ug/dL (ref 120–384)

## 2019-01-13 LAB — FERRITIN: Ferritin: 85 ng/mL (ref 11–307)

## 2019-01-13 LAB — RETICULOCYTES
Immature Retic Fract: 26.7 % — ABNORMAL HIGH (ref 2.3–15.9)
RBC.: 3.68 MIL/uL — ABNORMAL LOW (ref 3.87–5.11)
Retic Count, Absolute: 82.4 10*3/uL (ref 19.0–186.0)
Retic Ct Pct: 2.2 % (ref 0.4–3.1)

## 2019-01-14 ENCOUNTER — Telehealth: Payer: Self-pay

## 2019-01-14 ENCOUNTER — Telehealth: Payer: Self-pay | Admitting: Hematology

## 2019-01-14 NOTE — Telephone Encounter (Signed)
Scheduled appt per 7/14 lo.s °Printed and mailed appt calendar. °

## 2019-01-14 NOTE — Telephone Encounter (Signed)
I have arranged an OV for the patient per Dr Radford Pax with Dr Marlou Porch on 7/16.  She is having CP and Dr Radford Pax requested an OV.        COVID-19 Pre-Screening Questions:  . In the past 7 to 10 days have you had a cough,  shortness of breath, headache, congestion, fever (100 or greater) body aches, chills, sore throat, or sudden loss of taste or sense of smell?  no . Have you been around anyone with known Covid 19  no . Have you been around anyone who is awaiting Covid 19 test results in the past 7 to 10 days?  no Have you been around anyone who has been exposed to Covid 19, or has mentioned symptoms of Covid 19 within the past 7 to 10 days? no  If you have any concerns/questions about symptoms patients report during screening (either on the phone or at threshold). Contact the provider seeing the patient or DOD for further guidance.  If neither are available contact a member of the leadership team.

## 2019-01-15 ENCOUNTER — Other Ambulatory Visit: Payer: Self-pay

## 2019-01-15 ENCOUNTER — Encounter: Payer: Self-pay | Admitting: Cardiology

## 2019-01-15 ENCOUNTER — Ambulatory Visit (INDEPENDENT_AMBULATORY_CARE_PROVIDER_SITE_OTHER): Payer: Medicare Other | Admitting: Cardiology

## 2019-01-15 VITALS — BP 130/70 | HR 78 | Ht 59.5 in | Wt 201.0 lb

## 2019-01-15 DIAGNOSIS — E7800 Pure hypercholesterolemia, unspecified: Secondary | ICD-10-CM | POA: Diagnosis not present

## 2019-01-15 DIAGNOSIS — R0602 Shortness of breath: Secondary | ICD-10-CM | POA: Diagnosis not present

## 2019-01-15 DIAGNOSIS — I2510 Atherosclerotic heart disease of native coronary artery without angina pectoris: Secondary | ICD-10-CM

## 2019-01-15 DIAGNOSIS — I2583 Coronary atherosclerosis due to lipid rich plaque: Secondary | ICD-10-CM

## 2019-01-15 DIAGNOSIS — I10 Essential (primary) hypertension: Secondary | ICD-10-CM

## 2019-01-15 MED ORDER — FUROSEMIDE 20 MG PO TABS: 20.0000 mg | ORAL_TABLET | Freq: Every day | ORAL | 3 refills | Status: DC

## 2019-01-15 NOTE — Progress Notes (Signed)
Cardiology Office Note:    Date:  01/15/2019   ID:  [name], DOB 04-16-1951, MRN [id number]  PCP:  Carol Ada, MD  Cardiologist:  No primary care provider on file.  Electrophysiologist:  None   Referring MD: Carol Ada, MD   No chief complaint on file. Increased fluid accumulation stabbing chest pain  History of Present Illness:    [name] is a 68 y.o. female with a hx of with CAD CABG in 2008 who was seen in January 2019 with occasional chest pain by Dr. Radford Pax.  She suspected at the time she had microvascular angina and was given statin, aspirin and isosorbide.  Been feeling fluid retention. 196 this AM. Sharp CP, stabbing.   Most pain in in the back. NTG lay down NTG. Prior helps. Keeps 3 great grandchildren.  Lot's of lifting. Dr. Malachi Bonds.   Used to take lasix.   Past Medical History:  Diagnosis Date  . Anemia    "I see dr. Burr Medico @ [place]; I've had transfusion for low iron (04/17/2016)  . Anxiety   . Arthritis    "left hip, fingers"  (04/17/2016)  . Atypical chest pain    a. atypical symptoms since prior cath.  . CAD (coronary artery disease)    a. s/p 2007  LAD stent. b. s/p 07/2006 CABG with LIMA-LAD & SVG to Diag. c. 12/2006 diagonal branch w/ 2.5 x 12 mm Endeavor stent. d. s/p 2010 cath with small vessel disease treated with RX, stable by repeat 2015. e. CP/abnl nuc -> LHC 04/17/16: new ostial stenosis of LIMA-LAD s/p DES, EF 45%, chronically occ SVG-D2. f. Stable cath 12/2016.  Marland Kitchen Chronic combined systolic and diastolic CHF (congestive heart failure) ([place])   . CKD (chronic kidney disease), stage II   . Depression   . GERD (gastroesophageal reflux disease)   . LFYBOFBP(102.5)    "maybe once/month" (04/17/2016)  . Hyperlipidemia   . Hypertension   . Migraines    "couple times/yr"  ((04/17/2016)  . Myocardial infarction [facility]) ?2008  . Obesity   . OSA on CPAP    "suppose to wear mask; can't find it" (10/17//2017)  . Osteopenia 12/2010  &01/21/2013   Minimal of hip DEXA   . Pneumonia    "several years" (04/17/2016)  . Retinopathy 12/08   right  . RLS (restless legs syndrome)   . Thyroid disease    "blood work didn't show it but my dr said he can tell by looking at me; I had radiation" (04/17/2016)  . Type II diabetes mellitus ([place])     Past Surgical History:  Procedure Laterality Date  . CARDIAC CATHETERIZATION  02/03/2008   for CP patent LIMA w mid-vessel spasm.  Marland Kitchen CARDIAC CATHETERIZATION N/A 04/17/2016   Procedure: Left Heart Cath and Cors/Grafts Angiography;  Surgeon: Peter M Martinique, MD;  Location: [place] CV LAB;  Service: Cardiovascular;  Laterality: N/A;  . CARDIAC CATHETERIZATION N/A 04/17/2016   Procedure: Coronary Stent Intervention;  Surgeon: Peter M Martinique, MD;  Location: [place] [facility] LAB;  Service: Cardiovascular;  Laterality: N/A;  . COLONOSCOPY  10/2010   rectal polyp  . CORONARY ANGIOPLASTY WITH STENT PLACEMENT     "I've had quite a few stents"   . CORONARY ANGIOPLASTY WITH STENT PLACEMENT  04/17/2016  . CORONARY ARTERY BYPASS GRAFT  ?2008   s/p two vessel CABG off pump LAD diagonal and PTCA/DE Stent mid LAD/ DE stent LAD diagonal through previous stent sstruts,occluded SVG-diagonal  .  LAPAROSCOPIC CHOLECYSTECTOMY  1990's  . LEFT HEART CATH AND CORS/GRAFTS ANGIOGRAPHY N/A 12/31/2016   Procedure: Left Heart Cath and Cors/Grafts Angiography;  Surgeon: Martinique, Peter M, MD;  Location: [place] [facility];  Service: Cardiovascular;  Laterality: N/A;  . LEFT HEART CATHETERIZATION WITH CORONARY ANGIOGRAM N/A 07/15/2013   Procedure: LEFT HEART CATHETERIZATION WITH CORONARY ANGIOGRAM;  Surgeon: Peter M Martinique, MD;  Location: [facility] C[facility] LAB;  Service: Cardiovascular;  Laterality: N/A;  . TUBAL LIGATION  1976    Current Medications: Current Meds  Medication Sig  . acetaminophen (TYLENOL) 500 MG tablet Take 2 tablets (1,000 mg total) by mouth every 6 (six) hours as needed for moderate pain or headache.  Marland Kitchen  atorvastatin (LIPITOR) 80 MG tablet Take 80 mg by mouth daily.  Marland Kitchen ezetimibe (ZETIA) 10 MG tablet Take 1 tablet (10 mg total) by mouth daily.  . ferrous sulfate 325 (65 FE) MG EC tablet Take 325 mg by mouth 3 (three) times daily with meals.  Marland Kitchen glimepiride (AMARYL) 4 MG tablet   . isosorbide mononitrate (IMDUR) 60 MG 24 hr tablet Take 1 tablet (60 mg total) by mouth daily.  Marland Kitchen lisinopril (PRINIVIL,ZESTRIL) 5 MG tablet TAKE 1 TABLET BY MOUTH EVERY DAY  . metFORMIN (GLUCOPHAGE) 1000 MG tablet TAKE 1 TABLET BY MOUTH 2 TIMES DAILY WITH A MEAL.  . metoprolol tartrate (LOPRESSOR) 25 MG tablet Take 1 tablet (25 mg total) by mouth 2 (two) times daily.     Allergies:   Patient has no known allergies.   Social History   Socioeconomic History  . Marital status: Married    Spouse name: Not on file  . Number of children: Not on file  . Years of education: Not on file  . Highest education level: Not on file  Occupational History  . Not on file  Social Needs  . Financial resource strain: Not on file  . Food insecurity    Worry: Not on file    Inability: Not on file  . Transportation needs    Medical: Not on file    Non-medical: Not on file  Tobacco Use  . Smoking status: Former Smoker    Years: 25.00    Types: Cigarettes    Quit date: 07/02/1989    Years since quitting: 29.5  . Smokeless tobacco: Never Used  . Tobacco comment: "smoked 2-3 cigarettes/week"  Substance and Sexual Activity  . Alcohol use: No  . Drug use: No  . Sexual activity: Yes  Lifestyle  . Physical activity    Days per week: Not on file    Minutes per session: Not on file  . Stress: Not on file  Relationships  . Social Herbalist on phone: Not on file    Gets together: Not on file    Attends religious service: Not on file    Active member of club or organization: Not on file    Attends meetings of clubs or organizations: Not on file    Relationship status: Not on file  Other Topics Concern  . Not on  file  Social History Narrative  . Not on file     Family History: The patient's family history includes Alzheimer's disease in her maternal grandmother; Cancer in her father and mother; Diabetes in her maternal grandmother and mother; Hypertension in her mother.  ROS:   Please see the history of present illness.     All other systems reviewed and are negative.  EKGs/Labs/Other Studies Reviewed:  The following studies were reviewed today:  ECHO 2016 45%  EKG:  EKG is  ordered today.  The ekg ordered today demonstrates sinus rhythm with nonspecific ST-T wave changes.  Recent Labs: 01/13/2019: Hemoglobin 11.6; Platelet Count 228  Recent Lipid Panel    Component Value Date/Time   CHOL 98 (L) 11/07/2017 0812   TRIG 173 (H) 11/07/2017 0812   HDL 32 (L) 11/07/2017 0812   CHOLHDL 3.1 11/07/2017 0812   CHOLHDL 3.8 04/12/2016 0734   VLDL 42 (H) 04/12/2016 0734   LDLCALC 31 11/07/2017 0812    Physical Exam:    VS:  BP 130/70   Pulse 78   Ht 4' 11.5" (1.511 m)   Wt 201 lb (91.2 kg)   SpO2 96%   BMI 39.92 kg/m     Wt Readings from Last 3 Encounters:  01/15/19 201 lb (91.2 kg)  01/13/19 203 lb 1.6 oz (92.1 kg)  01/21/18 191 lb 8 oz (86.9 kg)     GEN:  Well nourished, well developed in no acute distress HEENT: Normal NECK: No JVD; No carotid bruits LYMPHATICS: No lymphadenopathy CARDIAC: RRR, no murmurs, rubs, gallops RESPIRATORY:  Clear to auscultation without rales, wheezing or rhonchi  ABDOMEN: Soft, non-tender, non-distended MUSCULOSKELETAL:  Mild edema; No deformity  SKIN: Warm and dry NEUROLOGIC:  Alert and oriented x 3 PSYCHIATRIC:  Normal affect   ASSESSMENT:    1. Coronary artery disease due to lipid rich plaque   2. Essential hypertension   3. Shortness of breath   4. Pure hypercholesterolemia    PLAN:    In order of problems listed above:  Increased fluid accumulation -I will start her on Lasix 20 mg once a day.  She has been on this in the  past.  I checked her creatinines and they have been in respectable ranges.  0.8 for instance.  Potassium has been mildly elevated at 4.4. -I will also check an echocardiogram to ensure proper structure and function of her heart.  In the past her EF has been 40 to 45%.  Ischemic cardiomyopathy/CAD - Prior EF 40 to 45%.  Rechecking echocardiogram.  Could this be reduced causing feel fluid accumulation? Lasix ECHO  Stabbing chest pain - Sounds musculoskeletal.  She has been helping with 36-year-old grandchildren.  Lifting.  F/u Dunn, PA 3 months.   Medication Adjustments/Labs and Tests Ordered: Current medicines are reviewed at length with the patient today.  Concerns regarding medicines are outlined above.  Orders Placed This Encounter  Procedures  . EKG 12-Lead  . ECHOCARDIOGRAM COMPLETE   Meds ordered this encounter  Medications  . furosemide (LASIX) 20 MG tablet    Sig: Take 1 tablet (20 mg total) by mouth daily.    Dispense:  90 tablet    Refill:  3    Patient Instructions  Medication Instructions:  Please start Furosemide 20 mg once daily. Continue all other medications as listed.  If you need a refill on your cardiac medications before your next appointment, please call your pharmacy.   Testing/Procedures: Your physician has requested that you have an echocardiogram. Echocardiography is a painless test that uses sound waves to create images of your heart. It provides your doctor with information about the size and shape of your heart and how well your heart's chambers and valves are working. This procedure takes approximately one hour. There are no restrictions for this procedure.  Follow-Up: At [facility], you and your health needs are our priority.  As part  of our continuing mission to provide you with exceptional heart care, we have created designated Provider Care Teams.  These Care Teams include your primary Cardiologist (physician) and Advanced Practice Providers  (APPs -  Physician Assistants and Nurse Practitioners) who all work together to provide you with the care you need, when you need it. You will need a follow up appointment in 3 months Dayna Dunn.  Please call our office 2 months in advance to schedule this appointment.  You may see Dr Radford Pax. or one of the following Advanced Practice Providers on your designated Care Team:   Lyda Jester, PA-C Melina Copa, PA-C . Ermalinda Barrios, PA-C  Thank you for choosing [facility]!!        Signed, Candee Furbish, MD  01/15/2019 6:03 PM    [place]

## 2019-01-15 NOTE — Patient Instructions (Signed)
Medication Instructions:  Please start Furosemide 20 mg once daily. Continue all other medications as listed.  If you need a refill on your cardiac medications before your next appointment, please call your pharmacy.   Testing/Procedures: Your physician has requested that you have an echocardiogram. Echocardiography is a painless test that uses sound waves to create images of your heart. It provides your doctor with information about the size and shape of your heart and how well your heart's chambers and valves are working. This procedure takes approximately one hour. There are no restrictions for this procedure.  Follow-Up: At [facility], you and your health needs are our priority.  As part of our continuing mission to provide you with exceptional heart care, we have created designated Provider Care Teams.  These Care Teams include your primary Cardiologist (physician) and Advanced Practice Providers (APPs -  Physician Assistants and Nurse Practitioners) who all work together to provide you with the care you need, when you need it. You will need a follow up appointment in 3 months Dayna Dunn.  Please call our office 2 months in advance to schedule this appointment.  You may see Dr Radford Pax. or one of the following Advanced Practice Providers on your designated Care Team:   Lyda Jester, PA-C Melina Copa, PA-C . Ermalinda Barrios, PA-C  Thank you for choosing [facility]!!

## 2019-01-19 ENCOUNTER — Ambulatory Visit: Payer: PPO | Admitting: Hematology

## 2019-01-29 ENCOUNTER — Ambulatory Visit (HOSPITAL_COMMUNITY): Payer: Medicare Other | Attending: Cardiovascular Disease

## 2019-01-29 ENCOUNTER — Other Ambulatory Visit: Payer: Self-pay

## 2019-01-29 DIAGNOSIS — R0602 Shortness of breath: Secondary | ICD-10-CM | POA: Diagnosis not present

## 2019-01-30 ENCOUNTER — Telehealth: Payer: Self-pay | Admitting: Cardiology

## 2019-01-30 NOTE — Telephone Encounter (Signed)
Follow up    Patient is returning call can be reached at [phone]

## 2019-01-30 NOTE — Telephone Encounter (Signed)
New message   Patient is returning call for Echo results. Please call. 

## 2019-01-30 NOTE — Telephone Encounter (Signed)
Notes recorded by Jerline Pain, MD on 01/30/2019 at 7:28 AM EDT  Pump function of heart has improved since prior ECHO.  Reassuring  OK to use lasix 20mg  PO PRN if fluid accumulation has improved.  Candee Furbish, MD   Attempted to call pt back and was sent to voicemail.  Left message to c/b to review above results and recommendations.

## 2019-01-30 NOTE — Telephone Encounter (Signed)
Pt aware of echo results and recommendations Pt verbalizes understanding ./cy

## 2019-04-12 NOTE — Progress Notes (Signed)
Cardiology Office Note:    Date:  04/16/2019   ID:  [name], DOB Aug 04, 1950, MRN [id number]  PCP:  Carol Ada, MD  Cardiologist:  Fransico Him, MD  Electrophysiologist:  None   Referring MD: Carol Ada, MD   Chief Complaint: follow-up of fluid retention  History of Present Illness:    [name] is a 68 y.o. female with a history of CAD s/p CABG x2 (LIMA-LAD and SVG-Diag) in 07/2006 with stenting to diagonal in 12/2006 and DES to LIMA-LAD graft in 04/2016, chronic combined CHF, hypertension, hyperlipidemia, type 2 diabetes mellitus, obstructive sleep apnea on CPAP, CKD stage II-III, and anxiety who is followed by Dr. Radford Pax and presents today for follow-up.   Patient has long history of CAD and often complains of chest pain. She had stent placed to LAD in 2007. He underwent CABG x2 with LIMA to LAD and SVG to Diagonal in 07/2006. She has had several cardiac catheterizations since that time She underwent stenting to Diagonal in 12/2006. Cath in 2010 showed small vessel disease. In 2017, she was seen for chest heaviness with typical and atypical features prompting nuclear stress test which was abnormal. She then underwent cardiac catheterization which showed chronically occluded SVG to 2nd Diag and patent LAD-LIMA graft but new ostial stenosis at the origin of the LIMA which was treated with DES. Most recent cardiac cath in 12/2016 showed patent LIMA to LAD with known occlusion of the SVG to Diagonal graft. Plan was for continued medical therapy. Recurrent chest pain has been felt to be due to GERD. She was referred to GI in 2018 and underwent Barium sawll study which was unremarkable. She was switched from Zantac ot PPI. Patient was seen by Dr. Radford Pax in 07/2017 and reported occasional chest pressure which she attributed to indigestion. Dr. Radford Pax felt like she also had microvascular angina. She was restarted on Imdur and Lopressor. She was last seen by Dr. Marlou Porch in 12/2018 for  further evaluation of fluid retention. She also reported some sharp stabbing chest pain that patient attributed to lifting her great grandchildren a lot. Felt to be musculoskeletal in nature. She was started on Lasix 20mg  daily for fluid retention. Echo was ordered and showed improved EF of 60-65% (previously 40-45% in 2016) with grade 1 diastolic dysfunction.   Recent Labs Reviewed: - 04/02/2019 ([facility] Hgb 12.7, Cr 0.9, K 4.4, TSH 1.170, Hgb A1c 9.3, Total Chol 84, Trig 165, HDL 34, LDL 17.  Patient presents today for follow-up. She is doing well today. Lower extremity edema has improved. She reports that she is only taking Lasix as needed and has not needed it in 3 weeks. She denies any chest pain or shortness of breath. She does note some PND recently but she thinks this is because she has not been using her CPAP machine (her husband was cleaning up and put it in the closet and she has not looked for it yet). No orthopnea. She notes some occasional lightheadedness if she has been on her feet for a while but states this is not new, does not occur frequently, and is not getting worse. This usually only last for a few minutes and then resolves on its own. No palpitations, falls, or syncope. She does report occasional left upper quadrant/rib pain. She describes this as sharp pain and states it last for a couple of minutes and then resolves spontaneously. She has not noticed any triggers for this but does states it is non-exertion. This does  not feel like previous cardiac pain. Also reports some occasional bilateral leg cramps at night. She does have diabetes and thinks it may be due to that. She recently had a [place] come by her else and she states she was told she has poor blood flow in her right leg. She denies any claudication. She states she will try to find this report and send it to [place].    Past Medical History:  Diagnosis Date   Anemia    "I see dr. Burr Medico @ [place]; I've had  transfusion for low iron (04/17/2016)   Anxiety    Arthritis    "left hip, fingers"  (04/17/2016)   At risk for medication noncompliance    Atypical chest pain    a. atypical symptoms since prior cath.   CAD (coronary artery disease)    a. s/p 2007  LAD stent. b. s/p 07/2006 CABG with LIMA-LAD & SVG to Diag. c. 12/2006 diagonal branch w/ 2.5 x 12 mm Endeavor stent. d. s/p 2010 cath with small vessel disease treated with RX, stable by repeat 2015. e. CP/abnl nuc -> LHC 04/17/16: new ostial stenosis of LIMA-LAD s/p DES, EF 45%, chronically occ SVG-D2. f. Stable cath 12/2016.   Chronic combined systolic and diastolic CHF (congestive heart failure) (HCC)    CKD (chronic kidney disease), stage II    Depression    GERD (gastroesophageal reflux disease)    Headache(784.0)    "maybe once/month" (04/17/2016)   Hyperlipidemia    Hypertension    Migraines    "couple times/yr"  ((04/17/2016)   Myocardial infarction [facility]) ?2008   Obesity    OSA on CPAP    "suppose to wear mask; can't find it" (10/17//2017)   Osteopenia 12/2010 &01/21/2013   Minimal of hip DEXA    Pneumonia    "several years" (04/17/2016)   Retinopathy 12/08   right   RLS (restless legs syndrome)    Thyroid disease    "blood work didn't show it but my dr said he can tell by looking at me; I had radiation" (04/17/2016)   Type II diabetes mellitus ([place])     Past Surgical History:  Procedure Laterality Date   CARDIAC CATHETERIZATION  02/03/2008   for CP patent LIMA w mid-vessel spasm.   CARDIAC CATHETERIZATION N/A 04/17/2016   Procedure: Left Heart Cath and Cors/Grafts Angiography;  Surgeon: Peter M Martinique, MD;  Location: [place] [facility];  Service: Cardiovascular;  Laterality: N/A;   CARDIAC CATHETERIZATION N/A 04/17/2016   Procedure: Coronary Stent Intervention;  Surgeon: Peter M Martinique, MD;  Location: [place] [facility];  Service: Cardiovascular;  Laterality: N/A;   COLONOSCOPY  10/2010   rectal  polyp   CORONARY ANGIOPLASTY WITH STENT PLACEMENT     "I've had quite a few stents"    CORONARY ANGIOPLASTY WITH STENT PLACEMENT  04/17/2016   CORONARY ARTERY BYPASS GRAFT  ?2008   s/p two vessel CABG off pump LAD diagonal and PTCA/DE Stent mid LAD/ DE stent LAD diagonal through previous stent sstruts,occluded SVG-diagonal   LAPAROSCOPIC CHOLECYSTECTOMY  1990's   LEFT HEART CATH AND CORS/GRAFTS ANGIOGRAPHY N/A 12/31/2016   Procedure: Left Heart Cath and Cors/Grafts Angiography;  Surgeon: Martinique, Peter M, MD;  Location: [place] [facility];  Service: Cardiovascular;  Laterality: N/A;   LEFT HEART CATHETERIZATION WITH CORONARY ANGIOGRAM N/A 07/15/2013   Procedure: LEFT HEART CATHETERIZATION WITH CORONARY ANGIOGRAM;  Surgeon: Peter M Martinique, MD;  Location: [facility] C[facility] LAB;  Service: Cardiovascular;  Laterality: N/A;   TUBAL LIGATION  1976    Current Medications: Current Meds  Medication Sig   acetaminophen (TYLENOL) 500 MG tablet Take 2 tablets (1,000 mg total) by mouth every 6 (six) hours as needed for moderate pain or headache.   atorvastatin (LIPITOR) 80 MG tablet Take 80 mg by mouth daily.   ezetimibe (ZETIA) 10 MG tablet Take 1 tablet (10 mg total) by mouth daily.   furosemide (LASIX) 20 MG tablet Take 20 mg by mouth daily as needed for fluid.   glimepiride (AMARYL) 4 MG tablet Take 2 mg by mouth 2 (two) times daily.    isosorbide mononitrate (IMDUR) 60 MG 24 hr tablet Take 1 tablet (60 mg total) by mouth daily.   lisinopril (PRINIVIL,ZESTRIL) 5 MG tablet TAKE 1 TABLET BY MOUTH EVERY DAY   metFORMIN (GLUCOPHAGE) 1000 MG tablet TAKE 1 TABLET BY MOUTH 2 TIMES DAILY WITH A MEAL.     Allergies:   Patient has no known allergies.   Social History   Socioeconomic History   Marital status: Married    Spouse name: Not on file   Number of children: Not on file   Years of education: Not on file   Highest education level: Not on file  Occupational History   Not on file    Social Needs   Financial resource strain: Not on file   Food insecurity    Worry: Not on file    Inability: Not on file   Transportation needs    Medical: Not on file    Non-medical: Not on file  Tobacco Use   Smoking status: Former Smoker    Years: 25.00    Types: Cigarettes    Quit date: 07/02/1989    Years since quitting: 29.8   Smokeless tobacco: Never Used   Tobacco comment: "smoked 2-3 cigarettes/week"  Substance and Sexual Activity   Alcohol use: No   Drug use: No   Sexual activity: Yes  Lifestyle   Physical activity    Days per week: Not on file    Minutes per session: Not on file   Stress: Not on file  Relationships   Social connections    Talks on phone: Not on file    Gets together: Not on file    Attends religious service: Not on file    Active member of club or organization: Not on file    Attends meetings of clubs or organizations: Not on file    Relationship status: Not on file  Other Topics Concern   Not on file  Social History Narrative   Not on file     Family History: The patient's family history includes Alzheimer's disease in her maternal grandmother; Cancer in her father and mother; Diabetes in her maternal grandmother and mother; Hypertension in her mother.  ROS:   Please see the history of present illness.    All other systems reviewed and are negative.  EKGs/Labs/Other Studies Reviewed:    The following studies were reviewed today:  Left Heart Catheterization 12/31/2016:  Ost LM lesion, 40 %stenosed.  Mid LAD-1 lesion, 0 %stenosed.  Ost 2nd Diag to 2nd Diag lesion, 0 %stenosed.  Mid LAD-2 lesion, 80 %stenosed.  Prox RCA to Mid RCA lesion, 10 %stenosed.  SVG.  Origin lesion, 100 %stenosed.  LIMA and is normal in caliber.  Origin-2 lesion, 30 %stenosed.  Origin-1 lesion, 0 %stenosed.  A drug eluting .  The left ventricular systolic function is normal.  LV end diastolic  pressure is normal.  The left  ventricular ejection fraction is 55-65% by visual estimate.   1. Single vessel obstructive CAD involving LAD 2. Patent LIMA to LAD 3. Known occlusion of SVG to diagonal 4. Normal LV function 5. Normal LVEDP  Plan: Medical therapy. _______________  Echocardiogram 01/29/2019: Impressions: 1. The left ventricle has normal systolic function with an ejection fraction of 60-65%. The cavity size was normal. There is mildly increased left ventricular wall thickness. Left ventricular diastolic Doppler parameters are consistent with impaired  relaxation.  2. The right ventricle has normal systolic function. The cavity was normal. There is no increase in right ventricular wall thickness.  3. Left atrial size was mildly dilated.  4. Mild thickening of the mitral valve leaflet.  5. The aortic valve is tricuspid. Mild thickening of the aortic valve.  6. The aorta is normal in size and structure.  EKG:  EKG not ordered today. Most recent EKG from 12/2018 showed normal sinus rhythm with non-specific ST/T changes.   Recent Labs: 01/13/2019: Hemoglobin 11.6; Platelet Count 228  Recent Lipid Panel    Component Value Date/Time   CHOL 98 (L) 11/07/2017 0812   TRIG 173 (H) 11/07/2017 0812   HDL 32 (L) 11/07/2017 0812   CHOLHDL 3.1 11/07/2017 0812   CHOLHDL 3.8 04/12/2016 0734   VLDL 42 (H) 04/12/2016 0734   LDLCALC 31 11/07/2017 0812    Physical Exam:    Vital Signs: BP (!) 108/58    Pulse 78    Ht 4' 11.5" (1.511 m)    Wt 196 lb 12.8 oz (89.3 kg)    SpO2 98%    BMI 39.08 kg/m     Wt Readings from Last 3 Encounters:  04/16/19 196 lb 12.8 oz (89.3 kg)  01/15/19 201 lb (91.2 kg)  01/13/19 203 lb 1.6 oz (92.1 kg)   General: 68 y.o. female  in no acute distress. HEENT: Normocephalic and atraumatic. Sclera clear.  Neck: Supple. No carotid bruits.  Heart: RRR. Distinct S1 and S2. No murmurs, gallops, or rubs. Distal pedal pulses 2+ and equal bilaterally. Lungs: No increased work of breathing.  Clear to ausculation bilaterally. No wheezes, rhonchi, or rales.  Abdomen: Soft, non-distended, and non-tender to palpation. Bowel sounds present. MSK: Normal strength and tone for age. Extremities: No lower extremity edema.    Skin: Warm and dry. Neuro: Alert and oriented x3. No focal deficits. Psych: Normal affect. Responds appropriately.  ASSESSMENT:    1. Coronary artery disease involving native coronary artery of native heart without angina pectoris   2. Chronic combined systolic and diastolic CHF (congestive heart failure) ([place])   3. Ischemic cardiomyopathy   4. Essential hypertension   5. Hyperlipidemia, unspecified hyperlipidemia type   6. Type 2 diabetes mellitus with complication, without long-term current use of insulin (HCC)   7. Obstructive sleep apnea   8. Leg cramps    PLAN:    CAD s/p CABG  - History of CABG x2 (LIMA-LAD and SVG-Diag) in 2008 with subsequent stenting. Most recent cath in 12/2016 showed patent LIMA-LAD graft with known occlusion of SVG-Diag. - Stable. No chest pain since last visit. She does note some occasional intermittent left upper quadrant/rib pain that is not exertional and not like previous cardiac pain.  - Continue Imdur 60mg  daily.  - She does not have Aspirin on her medication list and states she stopped taking this. I advised her to restart Aspirin 81mg  daily.  - Continue aggressive secondary prevention.   Chronic  Combined CHF/ Ischemic Cardiomyopathy - LVEF of 60-65% on most recent Echo from 12/2018 (improved from 40-45% in 2016). - Appears euvolemic on exam. Lower extremity swelling has resolved.  - Continue Lasix 20mg  PRN for edema.  - Continue sodium/fluid restriction.  Hypertension - BP well controlled at 108/58.  - Continue Lisinopril 5mg  daily, Lopressor 25mg  twice daily, and Imdur 60mg  daily. - Most recent labs from [facility] earlier this month showed Cr of 0.9 and K of 4.4.  Hyperlipidemia - Most recent lipid panel from 04/02/2019  (from [facility]): Total Cholesterol 84, Triglycerides 165, HDL 34, LDL 17.  - Well below LDL goal of <70. - Continue Lipitor 80mg  daily but can discontinue Zetia as it does not appear that this is needed. - Will recheck lipid panel in 3 months to make sure LDL remains at goal.   Diabetes Mellitus - Hemoglobin A1c 9.3% on 04/02/2019.  - Managed by PCP.  Obstructive Sleep Apnea - Patient states she has not been using her CPAP machine recently (husband was cleaning up and put CPAP machine in closet and patient has not gone to get it yet). Since then she reports a couple of episodes of waking up suddenly short of breath. - Recommended  restarting CPAP nightly and to let us know if these symptoms continue after that.  Leg Cramps - Patient report occasional bilateral leg cramps at night. She states home health RN through [facility] recently came by and told her she had poor blood flow in her right leg. She denies any claudication. Strong distal pedal pulses on exam. She is going to try to find this report and let us know exactly what it said. Do not think we need lower extremity ultrasounds/ABIs at this time.   Disposition: Follow-up in 6 months with Dr. Radford Pax or APP.  Medication Adjustments/Labs and Tests Ordered: Current medicines are reviewed at length with the patient today.  Concerns regarding medicines are outlined above.  Orders Placed This Encounter  Procedures   Lipid panel   Meds ordered this encounter  Medications   aspirin EC 81 MG tablet    Sig: Take 1 tablet (81 mg total) by mouth daily.    Order Specific Question:   Supervising Provider    Answer:   Skeet Latch [id number]    Patient Instructions  Medication Instructions:  Your physician has recommended you make the following change in your medication:  1.  STOP the Zetia   *If you need a refill on your cardiac medications before your next appointment, please call your pharmacy*  Lab Work: 07/20/2019:  Come fasting  for Lipid lab work   If you have labs (blood work) drawn today and your tests are completely normal, you will receive your results only by:  [place] (if you have MyChart) OR  A paper copy in the mail If you have any lab test that is abnormal or we need to change your treatment, we will call you to review the results.  Testing/Procedures: None ordered  Follow-Up: At [facility], you and your health needs are our priority.  As part of our continuing mission to provide you with exceptional heart care, we have created designated Provider Care Teams.  These Care Teams include your primary Cardiologist (physician) and Advanced Practice Providers (APPs -  Physician Assistants and Nurse Practitioners) who all work together to provide you with the care you need, when you need it.  Your next appointment:   6 months  The format for your next  appointment:   In Person  Provider:   You may see Fransico Him, MD or one of the following Advanced Practice Providers on your designated Care Team:    Truitt Merle, NP  Cecilie Kicks, NP  Kathyrn Drown, NP   Other Instructions     Signed, Darreld Mclean, PA-C  04/16/2019 10:38 AM    [place]

## 2019-04-15 ENCOUNTER — Encounter: Payer: Self-pay | Admitting: Physician Assistant

## 2019-04-16 ENCOUNTER — Encounter: Payer: Self-pay | Admitting: Student

## 2019-04-16 ENCOUNTER — Ambulatory Visit: Payer: Medicare Other | Admitting: Student

## 2019-04-16 ENCOUNTER — Other Ambulatory Visit: Payer: Self-pay

## 2019-04-16 VITALS — BP 108/58 | HR 78 | Ht 59.5 in | Wt 196.8 lb

## 2019-04-16 DIAGNOSIS — I2510 Atherosclerotic heart disease of native coronary artery without angina pectoris: Secondary | ICD-10-CM | POA: Diagnosis not present

## 2019-04-16 DIAGNOSIS — E785 Hyperlipidemia, unspecified: Secondary | ICD-10-CM

## 2019-04-16 DIAGNOSIS — I255 Ischemic cardiomyopathy: Secondary | ICD-10-CM | POA: Diagnosis not present

## 2019-04-16 DIAGNOSIS — I10 Essential (primary) hypertension: Secondary | ICD-10-CM | POA: Diagnosis not present

## 2019-04-16 DIAGNOSIS — I5042 Chronic combined systolic (congestive) and diastolic (congestive) heart failure: Secondary | ICD-10-CM

## 2019-04-16 DIAGNOSIS — R252 Cramp and spasm: Secondary | ICD-10-CM

## 2019-04-16 DIAGNOSIS — G4733 Obstructive sleep apnea (adult) (pediatric): Secondary | ICD-10-CM

## 2019-04-16 DIAGNOSIS — E118 Type 2 diabetes mellitus with unspecified complications: Secondary | ICD-10-CM

## 2019-04-16 MED ORDER — ASPIRIN EC 81 MG PO TBEC: 81.0000 mg | DELAYED_RELEASE_TABLET | Freq: Every day | ORAL | Status: AC

## 2019-04-16 NOTE — Patient Instructions (Addendum)
Medication Instructions:  Your physician has recommended you make the following change in your medication:  1.  STOP the Zetia   *If you need a refill on your cardiac medications before your next appointment, please call your pharmacy*  Lab Work: 07/20/2019:  Come fasting for Lipid lab work   If you have labs (blood work) drawn today and your tests are completely normal, you will receive your results only by: [place] MyChart Message (if you have MyChart) OR . A paper copy in the mail If you have any lab test that is abnormal or we need to change your treatment, we will call you to review the results.  Testing/Procedures: None ordered  Follow-Up: At [facility], you and your health needs are our priority.  As part of our continuing mission to provide you with exceptional heart care, we have created designated Provider Care Teams.  These Care Teams include your primary Cardiologist (physician) and Advanced Practice Providers (APPs -  Physician Assistants and Nurse Practitioners) who all work together to provide you with the care you need, when you need it.  Your next appointment:   6 months  The format for your next appointment:   In Person  Provider:   You may see Fransico Him, MD or one of the following Advanced Practice Providers on your designated Care Team:    Truitt Merle, NP  Cecilie Kicks, NP  Kathyrn Drown, NP   Other Instructions

## 2019-05-05 ENCOUNTER — Other Ambulatory Visit: Payer: Self-pay

## 2019-05-05 DIAGNOSIS — Z20822 Contact with and (suspected) exposure to covid-19: Secondary | ICD-10-CM

## 2019-05-06 LAB — NOVEL CORONAVIRUS, NAA: SARS-CoV-2, NAA: NOT DETECTED

## 2019-05-17 ENCOUNTER — Encounter (HOSPITAL_COMMUNITY): Payer: Self-pay | Admitting: Emergency Medicine

## 2019-05-17 ENCOUNTER — Emergency Department (HOSPITAL_COMMUNITY)
Admission: EM | Admit: 2019-05-17 | Discharge: 2019-05-17 | Disposition: A | Discharge status: Home / Self Care | Payer: Medicare Other | Attending: Emergency Medicine | Admitting: Emergency Medicine

## 2019-05-17 ENCOUNTER — Other Ambulatory Visit: Payer: Self-pay

## 2019-05-17 DIAGNOSIS — Z7984 Long term (current) use of oral hypoglycemic drugs: Secondary | ICD-10-CM | POA: Diagnosis not present

## 2019-05-17 DIAGNOSIS — Z87891 Personal history of nicotine dependence: Secondary | ICD-10-CM | POA: Diagnosis not present

## 2019-05-17 DIAGNOSIS — K047 Periapical abscess without sinus: Secondary | ICD-10-CM | POA: Diagnosis not present

## 2019-05-17 DIAGNOSIS — I130 Hypertensive heart and chronic kidney disease with heart failure and stage 1 through stage 4 chronic kidney disease, or unspecified chronic kidney disease: Secondary | ICD-10-CM | POA: Insufficient documentation

## 2019-05-17 DIAGNOSIS — I5042 Chronic combined systolic (congestive) and diastolic (congestive) heart failure: Secondary | ICD-10-CM | POA: Insufficient documentation

## 2019-05-17 DIAGNOSIS — R600 Localized edema: Secondary | ICD-10-CM | POA: Diagnosis present

## 2019-05-17 DIAGNOSIS — E1122 Type 2 diabetes mellitus with diabetic chronic kidney disease: Secondary | ICD-10-CM | POA: Insufficient documentation

## 2019-05-17 DIAGNOSIS — Z7982 Long term (current) use of aspirin: Secondary | ICD-10-CM | POA: Insufficient documentation

## 2019-05-17 DIAGNOSIS — N182 Chronic kidney disease, stage 2 (mild): Secondary | ICD-10-CM | POA: Diagnosis not present

## 2019-05-17 MED ORDER — HYDROCODONE-ACETAMINOPHEN 5-325 MG PO TABS
1.0000 | ORAL_TABLET | Freq: Once | ORAL | Status: AC
  Administered 2019-05-17: 1 via ORAL
  Filled 2019-05-17: qty 1

## 2019-05-17 MED ORDER — HYDROCODONE-ACETAMINOPHEN 5-325 MG PO TABS: 1.0000 | ORAL_TABLET | ORAL | 0 refills | Status: DC | PRN

## 2019-05-17 MED ORDER — PENICILLIN V POTASSIUM 500 MG PO TABS: 500.0000 mg | ORAL_TABLET | Freq: Three times a day (TID) | ORAL | 0 refills | Status: AC

## 2019-05-17 MED ORDER — PENICILLIN V POTASSIUM 250 MG PO TABS
500.0000 mg | ORAL_TABLET | Freq: Once | ORAL | Status: AC
  Administered 2019-05-17: 500 mg via ORAL
  Filled 2019-05-17: qty 2

## 2019-05-17 NOTE — Discharge Instructions (Addendum)
Take the medications as prescribed. REturn to the emergency department if you develop a high fever, worsening swelling, difficulty swallowing or for new concern. Otherwise, please make an appointment with your dentist in 1 week.

## 2019-05-17 NOTE — ED Provider Notes (Signed)
[place] EMERGENCY DEPARTMENT Provider Note   CSN: [id number] Arrival date & time: 05/17/19  [id number]     History   Chief Complaint Chief Complaint  Patient presents with  . jaw swelling    HPI [name] is a 68 y.o. female.     Patient to ED with c/o right facial swelling x 1 day. She denies fever, nausea, headache or visual problems. No dental issues that she is aware of. No difficulty swallowing. No nausea or vomiting.  The history is provided by the patient. No language interpreter was used.    Past Medical History:  Diagnosis Date  . Anemia    "I see dr. Burr Medico @ [place]; I've had transfusion for low iron (04/17/2016)  . Anxiety   . Arthritis    "left hip, fingers"  (04/17/2016)  . At risk for medication noncompliance   . Atypical chest pain    a. atypical symptoms since prior cath.  . CAD (coronary artery disease)    a. s/p 2007  LAD stent. b. s/p 07/2006 CABG with LIMA-LAD & SVG to Diag. c. 12/2006 diagonal branch w/ 2.5 x 12 mm Endeavor stent. d. s/p 2010 cath with small vessel disease treated with RX, stable by repeat 2015. e. CP/abnl nuc -> LHC 04/17/16: new ostial stenosis of LIMA-LAD s/p DES, EF 45%, chronically occ SVG-D2. f. Stable cath 12/2016.  Marland Kitchen Chronic combined systolic and diastolic CHF (congestive heart failure) ([place])   . CKD (chronic kidney disease), stage II   . Depression   . GERD (gastroesophageal reflux disease)   . [id number])    "maybe once/month" (04/17/2016)  . Hyperlipidemia   . Hypertension   . Migraines    "couple times/yr"  ((04/17/2016)  . Myocardial infarction [facility]) ?2008  . Obesity   . OSA on CPAP    "suppose to wear mask; can't find it" (10/17//2017)  . Osteopenia 12/2010 &01/21/2013   Minimal of hip DEXA   . Pneumonia    "several years" (04/17/2016)  . Retinopathy 12/08   right  . RLS (restless legs syndrome)   . Thyroid disease    "blood work didn't show it but my dr said he can tell by  looking at me; I had radiation" (04/17/2016)  . Type II diabetes mellitus [facility])     Patient Active Problem List   Diagnosis Date Noted  . Chronic diastolic CHF (congestive heart failure) ([place]) 07/14/2017  . Iron deficiency anemia 10/11/2015  . Anemia 12/21/2014  . SOB (shortness of breath) 12/03/2014  . Graves disease 07/14/2013  . Hyperlipidemia 07/14/2013  . Diabetes mellitus type 2, noninsulin dependent ([place]) 07/14/2013  . Hypertension 07/14/2013  . GERD (gastroesophageal reflux disease) 07/14/2013  . CAD (coronary artery disease)   . Obesity (BMI 30-39.9)     Past Surgical History:  Procedure Laterality Date  . CARDIAC CATHETERIZATION  02/03/2008   for CP patent LIMA w mid-vessel spasm.  Marland Kitchen CARDIAC CATHETERIZATION N/A 04/17/2016   Procedure: Left Heart Cath and Cors/Grafts Angiography;  Surgeon: Peter M Martinique, MD;  Location: [place] CV LAB;  Service: Cardiovascular;  Laterality: N/A;  . CARDIAC CATHETERIZATION N/A 04/17/2016   Procedure: Coronary Stent Intervention;  Surgeon: Peter M Martinique, MD;  Location: [place] CV LAB;  Service: Cardiovascular;  Laterality: N/A;  . COLONOSCOPY  10/2010   rectal polyp  . CORONARY ANGIOPLASTY WITH STENT PLACEMENT     "I've had quite a few stents"   . CORONARY ANGIOPLASTY WITH STENT  PLACEMENT  04/17/2016  . CORONARY ARTERY BYPASS GRAFT  ?2008   s/p two vessel CABG off pump LAD diagonal and PTCA/DE Stent mid LAD/ DE stent LAD diagonal through previous stent sstruts,occluded SVG-diagonal  . LAPAROSCOPIC CHOLECYSTECTOMY  1990's  . LEFT HEART CATH AND CORS/GRAFTS ANGIOGRAPHY N/A 12/31/2016   Procedure: Left Heart Cath and Cors/Grafts Angiography;  Surgeon: Martinique, Peter M, MD;  Location: [place] CV LAB;  Service: Cardiovascular;  Laterality: N/A;  . LEFT HEART CATHETERIZATION WITH CORONARY ANGIOGRAM N/A 07/15/2013   Procedure: LEFT HEART CATHETERIZATION WITH CORONARY ANGIOGRAM;  Surgeon: Peter M Martinique, MD;  Location: [facility] CATH LAB;   Service: Cardiovascular;  Laterality: N/A;  . TUBAL LIGATION  1976     OB History   No obstetric history on file.      Home Medications    Prior to Admission medications   Medication Sig Start Date End Date Taking? Authorizing Provider  acetaminophen (TYLENOL) 500 MG tablet Take 2 tablets (1,000 mg total) by mouth every 6 (six) hours as needed for moderate pain or headache. 04/17/16   Charlie Pitter, PA-C  aspirin EC 81 MG tablet Take 1 tablet (81 mg total) by mouth daily. 04/16/19   Sande Rives E, PA-C  atorvastatin (LIPITOR) 80 MG tablet Take 80 mg by mouth daily.    [provider]  ezetimibe (ZETIA) 10 MG tablet Take 1 tablet (10 mg total) by mouth daily. 07/09/17 04/16/19  Sueanne Margarita, MD  furosemide (LASIX) 20 MG tablet Take 20 mg by mouth daily as needed for fluid.    [provider]  glimepiride (AMARYL) 4 MG tablet Take 2 mg by mouth 2 (two) times daily.  01/04/19   [provider]  isosorbide mononitrate (IMDUR) 60 MG 24 hr tablet Take 1 tablet (60 mg total) by mouth daily. 07/15/17 04/16/19  Sueanne Margarita, MD  lisinopril (PRINIVIL,ZESTRIL) 5 MG tablet TAKE 1 TABLET BY MOUTH EVERY DAY 08/22/16   Dunn, Lisbeth Renshaw N, PA-C  metFORMIN (GLUCOPHAGE) 1000 MG tablet TAKE 1 TABLET BY MOUTH 2 TIMES DAILY WITH A MEAL. 06/11/17   Dunn, Nedra Hai, PA-C  metoprolol tartrate (LOPRESSOR) 25 MG tablet Take 1 tablet (25 mg total) by mouth 2 (two) times daily. 07/15/17 01/15/19  Sueanne Margarita, MD    Family History Family History  Problem Relation Age of Onset  . Cancer Father        Lung  . Hypertension Mother   . Cancer Mother        Lung  . Diabetes Mother   . Diabetes Maternal Grandmother   . Alzheimer's disease Maternal Grandmother     Social History Social History   Tobacco Use  . Smoking status: Former Smoker    Years: 25.00    Types: Cigarettes    Quit date: 07/02/1989    Years since quitting: 29.8  . Smokeless tobacco: Never Used  . Tobacco  comment: "smoked 2-3 cigarettes/week"  Substance Use Topics  . Alcohol use: No  . Drug use: No     Allergies   Patient has no known allergies.   Review of Systems Review of Systems  Constitutional: Negative for fever.  HENT: Positive for facial swelling. Negative for dental problem, sore throat and trouble swallowing.   Respiratory: Negative for shortness of breath.   Gastrointestinal: Negative for nausea.     Physical Exam Updated Vital Signs BP (!) 150/81   Pulse 98   Temp 99.1 F (37.3 C) (Oral)  Resp 16   SpO2 96%   Physical Exam Constitutional:      General: She is not in acute distress.    Appearance: Normal appearance. She is obese.  HENT:     Head:     Comments: Right maxillary facial swelling without discrete abscess. Generally poor dentition with tenderness of #4 on palpation. Oropharynx benign.     Mouth/Throat:     Mouth: Mucous membranes are moist.  Eyes:     Conjunctiva/sclera: Conjunctivae normal.  Neck:     Musculoskeletal: Normal range of motion and neck supple.  Skin:    General: Skin is warm and dry.  Neurological:     General: No focal deficit present.     Mental Status: She is alert.      ED Treatments / Results  Labs (all labs ordered are listed, but only abnormal results are displayed) Labs Reviewed - No data to display  EKG None  Radiology No results found.  Procedures Procedures (including critical care time)  Medications Ordered in ED Medications - No data to display   Initial Impression / Assessment and Plan / ED Course  I have reviewed the triage vital signs and the nursing notes.  Pertinent labs & imaging results that were available during my care of the patient were reviewed by me and considered in my medical decision making (see chart for details).        Patient to ED with painful facial swelling since yesterday. No fever.   There is dental tenderness associated with the area of swelling suggesting dental  apical abscess. No drainable abscess visualized.   Will start on PCN, provide limited pain relief and encourage dental visit.   Final Clinical Impressions(s) / ED Diagnoses   Final diagnoses:  None   1. Dental abscess   ED Discharge Orders    None       Charlann Lange, Hershal Coria 05/17/19 1021    Ezequiel Essex, MD 05/17/19 1719

## 2019-05-17 NOTE — ED Triage Notes (Signed)
C/o R jaw swelling and pain since yesterday.  Denies dental pain.

## 2019-06-05 ENCOUNTER — Other Ambulatory Visit: Payer: Self-pay

## 2019-06-05 ENCOUNTER — Encounter: Payer: Self-pay | Admitting: Cardiology

## 2019-06-05 ENCOUNTER — Encounter (INDEPENDENT_AMBULATORY_CARE_PROVIDER_SITE_OTHER): Payer: Self-pay

## 2019-06-05 ENCOUNTER — Ambulatory Visit (INDEPENDENT_AMBULATORY_CARE_PROVIDER_SITE_OTHER): Payer: Medicare Other | Admitting: Cardiology

## 2019-06-05 VITALS — BP 126/64 | HR 87 | Ht 59.5 in | Wt 197.4 lb

## 2019-06-05 DIAGNOSIS — E7800 Pure hypercholesterolemia, unspecified: Secondary | ICD-10-CM

## 2019-06-05 DIAGNOSIS — G4733 Obstructive sleep apnea (adult) (pediatric): Secondary | ICD-10-CM

## 2019-06-05 DIAGNOSIS — I2510 Atherosclerotic heart disease of native coronary artery without angina pectoris: Secondary | ICD-10-CM | POA: Diagnosis not present

## 2019-06-05 DIAGNOSIS — I5032 Chronic diastolic (congestive) heart failure: Secondary | ICD-10-CM

## 2019-06-05 DIAGNOSIS — I10 Essential (primary) hypertension: Secondary | ICD-10-CM | POA: Diagnosis not present

## 2019-06-05 DIAGNOSIS — E119 Type 2 diabetes mellitus without complications: Secondary | ICD-10-CM

## 2019-06-05 NOTE — Patient Instructions (Signed)
Medication Instructions:  Your physician recommends that you continue on your current medications as directed. Please refer to the Current Medication list given to you today.  *If you need a refill on your cardiac medications before your next appointment, please call your pharmacy*   Follow-Up: At [name], you and your health needs are our priority.  As part of our continuing mission to provide you with exceptional heart care, we have created designated Provider Care Teams.  These Care Teams include your primary Cardiologist (physician) and Advanced Practice Providers (APPs -  Physician Assistants and Nurse Practitioners) who all work together to provide you with the care you need, when you need it.  Your next appointment:   6 month(s)  The format for your next appointment:   Either In Person or Virtual  Provider:   Fransico Him, MD

## 2019-06-05 NOTE — Progress Notes (Signed)
Cardiology Office Note:    Date:  06/05/2019   ID:  [name], DOB 10-08-1950, MRN [id number]  PCP:  Carol Ada, MD  Cardiologist:  Fransico Him, MD    Referring MD: Carol Ada, MD   Chief Complaint  Patient presents with  . Coronary Artery Disease  . Hypertension  . Hyperlipidemia    History of Present Illness:    [name] is a 68 y.o. female with a hx of CAD (LAD stent 2007, CABG in 07/2006, Endeavor stent to diagonal 12/2006, cath 2010 with small vessel dz, stable cath 07/2013, s/p DES to the LIMA-LAD in 04/2016, [place] 12/31/16 due to CP showed patent LIMA-LAD, known occlusion of SVG-diagonal and normal LVF - CP felt due to GERD and was placed back on PPI),  HTN,  chronic LE (at end of the day), chronic combined CHF, probable CKD II.  She has chronic CP felt related to microvascular angina.  She is not on Plavix due to financial constraints.  She was seen by Dr. Marlou Porch in July with sharp stabbing CP that was felt to be MSK in origin but also complained of increased fluid retentin and was started on lasix. She was seen by my PA in Oct 2020 and she was only having to take her Lasix PRN and has no further CP.    She is here today for followup and is doing well.  She denies any chest pain or pressure, SOB, DOE, PND, orthopnea, LE edema, dizziness, palpitations or syncope. She is compliant with her meds and is tolerating meds with no SE.    Past Medical History:  Diagnosis Date  . Anemia    "I see dr. Burr Medico @ [place]; I've had transfusion for low iron (04/17/2016)  . Anxiety   . Arthritis    "left hip, fingers"  (04/17/2016)  . At risk for medication noncompliance   . Atypical chest pain    a. atypical symptoms since prior cath.  . CAD (coronary artery disease)    a. s/p 2007  LAD stent. b. s/p 07/2006 CABG with LIMA-LAD & SVG to Diag. c. 12/2006 diagonal branch w/ 2.5 x 12 mm Endeavor stent. d. s/p 2010 cath with small vessel disease treated with RX, stable by  repeat 2015. e. CP/abnl nuc -> LHC 04/17/16: new ostial stenosis of LIMA-LAD s/p DES, EF 45%, chronically occ SVG-D2. f. Stable cath 12/2016.  Marland Kitchen Chronic combined systolic and diastolic CHF (congestive heart failure) ([place])   . CKD (chronic kidney disease), stage II   . Depression   . GERD (gastroesophageal reflux disease)   . [id number])    "maybe once/month" (04/17/2016)  . Hyperlipidemia   . Hypertension   . Migraines    "couple times/yr"  ((04/17/2016)  . Myocardial infarction [facility]) ?2008  . Obesity   . OSA on CPAP    "suppose to wear mask; can't find it" (10/17//2017)  . Osteopenia 12/2010 &01/21/2013   Minimal of hip DEXA   . Pneumonia    "several years" (04/17/2016)  . Retinopathy 12/08   right  . RLS (restless legs syndrome)   . Thyroid disease    "blood work didn't show it but my dr said he can tell by looking at me; I had radiation" (04/17/2016)  . Type II diabetes mellitus ([place])     Past Surgical History:  Procedure Laterality Date  . CARDIAC CATHETERIZATION  02/03/2008   for CP patent LIMA w mid-vessel spasm.  Marland Kitchen CARDIAC CATHETERIZATION N/A 04/17/2016  Procedure: Left Heart Cath and Cors/Grafts Angiography;  Surgeon: Peter M Martinique, MD;  Location: [place] CV LAB;  Service: Cardiovascular;  Laterality: N/A;  . CARDIAC CATHETERIZATION N/A 04/17/2016   Procedure: Coronary Stent Intervention;  Surgeon: Peter M Martinique, MD;  Location: [place] CV LAB;  Service: Cardiovascular;  Laterality: N/A;  . COLONOSCOPY  10/2010   rectal polyp  . CORONARY ANGIOPLASTY WITH STENT PLACEMENT     "I've had quite a few stents"   . CORONARY ANGIOPLASTY WITH STENT PLACEMENT  04/17/2016  . CORONARY ARTERY BYPASS GRAFT  ?2008   s/p two vessel CABG off pump LAD diagonal and PTCA/DE Stent mid LAD/ DE stent LAD diagonal through previous stent sstruts,occluded SVG-diagonal  . LAPAROSCOPIC CHOLECYSTECTOMY  1990's  . LEFT HEART CATH AND CORS/GRAFTS ANGIOGRAPHY N/A 12/31/2016   Procedure: Left  Heart Cath and Cors/Grafts Angiography;  Surgeon: Martinique, Peter M, MD;  Location: [place] CV LAB;  Service: Cardiovascular;  Laterality: N/A;  . LEFT HEART CATHETERIZATION WITH CORONARY ANGIOGRAM N/A 07/15/2013   Procedure: LEFT HEART CATHETERIZATION WITH CORONARY ANGIOGRAM;  Surgeon: Peter M Martinique, MD;  Location: [facility] CATH LAB;  Service: Cardiovascular;  Laterality: N/A;  . TUBAL LIGATION  1976    Current Medications: Current Meds  Medication Sig  . acetaminophen (TYLENOL) 500 MG tablet Take 2 tablets (1,000 mg total) by mouth every 6 (six) hours as needed for moderate pain or headache.  Marland Kitchen aspirin EC 81 MG tablet Take 1 tablet (81 mg total) by mouth daily.  Marland Kitchen atorvastatin (LIPITOR) 80 MG tablet Take 80 mg by mouth daily.  . furosemide (LASIX) 20 MG tablet Take 20 mg by mouth daily as needed for fluid.  Marland Kitchen glimepiride (AMARYL) 4 MG tablet Take 2 mg by mouth 2 (two) times daily.   Marland Kitchen HYDROcodone-acetaminophen (NORCO/VICODIN) 5-325 MG tablet Take 1 tablet by mouth every 4 (four) hours as needed for severe pain.  . isosorbide mononitrate (IMDUR) 60 MG 24 hr tablet Take 1 tablet (60 mg total) by mouth daily.  Marland Kitchen lisinopril (PRINIVIL,ZESTRIL) 5 MG tablet TAKE 1 TABLET BY MOUTH EVERY DAY  . metFORMIN (GLUCOPHAGE) 1000 MG tablet TAKE 1 TABLET BY MOUTH 2 TIMES DAILY WITH A MEAL.  . metoprolol tartrate (LOPRESSOR) 25 MG tablet Take 1 tablet (25 mg total) by mouth 2 (two) times daily.     Allergies:   Patient has no known allergies.   Social History   Socioeconomic History  . Marital status: Married    Spouse name: Not on file  . Number of children: Not on file  . Years of education: Not on file  . Highest education level: Not on file  Occupational History  . Not on file  Social Needs  . Financial resource strain: Not on file  . Food insecurity    Worry: Not on file    Inability: Not on file  . Transportation needs    Medical: Not on file    Non-medical: Not on file  Tobacco Use  .  Smoking status: Former Smoker    Years: 25.00    Types: Cigarettes    Quit date: 07/02/1989    Years since quitting: 29.9  . Smokeless tobacco: Never Used  . Tobacco comment: "smoked 2-3 cigarettes/week"  Substance and Sexual Activity  . Alcohol use: No  . Drug use: No  . Sexual activity: Yes  Lifestyle  . Physical activity    Days per week: Not on file    Minutes per session: Not  on file  . Stress: Not on file  Relationships  . Social Herbalist on phone: Not on file    Gets together: Not on file    Attends religious service: Not on file    Active member of club or organization: Not on file    Attends meetings of clubs or organizations: Not on file    Relationship status: Not on file  Other Topics Concern  . Not on file  Social History Narrative  . Not on file     Family History: The patient's family history includes Alzheimer's disease in her maternal grandmother; Cancer in her father and mother; Diabetes in her maternal grandmother and mother; Hypertension in her mother.  ROS:   Please see the history of present illness.    ROS  All other systems reviewed and negative.   EKGs/Labs/Other Studies Reviewed:    The following studies were reviewed today: none  EKG:  EKG is not ordered today.  T  Recent Labs: 01/13/2019: Hemoglobin 11.6; Platelet Count 228   Recent Lipid Panel    Component Value Date/Time   CHOL 98 (L) 11/07/2017 0812   TRIG 173 (H) 11/07/2017 0812   HDL 32 (L) 11/07/2017 0812   CHOLHDL 3.1 11/07/2017 0812   CHOLHDL 3.8 04/12/2016 0734   VLDL 42 (H) 04/12/2016 0734   LDLCALC 31 11/07/2017 0812    Physical Exam:    VS:  BP 126/64   Pulse 87   Ht 4' 11.5" (1.511 m)   Wt 197 lb 6.4 oz (89.5 kg)   SpO2 98%   BMI 39.20 kg/m     Wt Readings from Last 3 Encounters:  06/05/19 197 lb 6.4 oz (89.5 kg)  04/16/19 196 lb 12.8 oz (89.3 kg)  01/15/19 201 lb (91.2 kg)     GEN:  Well nourished, well developed in no acute distress HEENT:  Normal NECK: No JVD; No carotid bruits LYMPHATICS: No lymphadenopathy CARDIAC: RRR, no murmurs, rubs, gallops RESPIRATORY:  Clear to auscultation without rales, wheezing or rhonchi  ABDOMEN: Soft, non-tender, non-distended MUSCULOSKELETAL:  No edema; No deformity  SKIN: Warm and dry NEUROLOGIC:  Alert and oriented x 3 PSYCHIATRIC:  Normal affect   ASSESSMENT:    1. Coronary artery disease involving native coronary artery of native heart without angina pectoris   2. Chronic diastolic CHF (congestive heart failure) ([place])   3. Essential hypertension   4. Pure hypercholesterolemia   5. DM type 2, goal HbA1c < 7% (HCC)   6. Obstructive sleep apnea    PLAN:    In order of problems listed above:  1. CAD s/p CABG  - History of CABG x2 (LIMA-LAD and SVG-Diag) in 2008 with subsequent stenting. Most recent cath in 12/2016 showed patent LIMA-LAD graft with known occlusion of SVG-Diag. - Stable. No chest pain since last visit. She does note some occasional intermittent left upper quadrant/rib pain that is not exertional and not like previous cardiac pain.  - Continue ASA 81mg  daily, Imdur 60mg  daily, BB and statin.   2. Chronic Combined CHF/ Ischemic Cardiomyopathy - LVEF of 60-65% on most recent Echo from 12/2018 (improved from 40-45% in 2016). - Appears euvolemic on exam. Lower extremity swelling has resolved.  - Continue Lasix 20mg  PRN for edema.  - Continue sodium/fluid restriction.  3. Hypertension - BP well controlled  - Continue Lisinopril 5mg  daily, Lopressor 25mg  twice daily, and Imdur 60mg  daily. - creatinine 0.9 and K+ 4.4 in Oct 2020  4.  Hyperlipidemia -Well below LDL goal of <70. -LDL 47 in Oct 2020 -Continue Lipitor 80mg  daily   5. Diabetes Mellitus - Hemoglobin A1c11.3% on 04/02/2019.  - Managed by PCP.  6. Obstructive Sleep Apnea -She has not been using her PAP as her husband misplaced it -I encouraged her to find her device and get back on it   Medication  Adjustments/Labs and Tests Ordered: Current medicines are reviewed at length with the patient today.  Concerns regarding medicines are outlined above.  No orders of the defined types were placed in this encounter.  No orders of the defined types were placed in this encounter.   Signed, Fransico Him, MD  06/05/2019 2:28 PM    [place]

## 2019-07-16 ENCOUNTER — Inpatient Hospital Stay: Payer: Medicare Other | Attending: Hematology

## 2019-07-16 ENCOUNTER — Other Ambulatory Visit: Payer: Self-pay

## 2019-07-16 DIAGNOSIS — D500 Iron deficiency anemia secondary to blood loss (chronic): Secondary | ICD-10-CM

## 2019-07-16 DIAGNOSIS — Z7984 Long term (current) use of oral hypoglycemic drugs: Secondary | ICD-10-CM | POA: Insufficient documentation

## 2019-07-16 DIAGNOSIS — E119 Type 2 diabetes mellitus without complications: Secondary | ICD-10-CM | POA: Diagnosis not present

## 2019-07-16 DIAGNOSIS — E079 Disorder of thyroid, unspecified: Secondary | ICD-10-CM | POA: Diagnosis not present

## 2019-07-16 DIAGNOSIS — Z79899 Other long term (current) drug therapy: Secondary | ICD-10-CM | POA: Diagnosis not present

## 2019-07-16 DIAGNOSIS — D508 Other iron deficiency anemias: Secondary | ICD-10-CM | POA: Insufficient documentation

## 2019-07-16 LAB — RETICULOCYTES
Immature Retic Fract: 23.2 % — ABNORMAL HIGH (ref 2.3–15.9)
RBC.: 3.6 MIL/uL — ABNORMAL LOW (ref 3.87–5.11)
Retic Count, Absolute: 80.3 10*3/uL (ref 19.0–186.0)
Retic Ct Pct: 2.2 % (ref 0.4–3.1)

## 2019-07-16 LAB — CBC WITH DIFFERENTIAL/PLATELET
Abs Immature Granulocytes: 0.04 10*3/uL (ref 0.00–0.07)
Basophils Absolute: 0 10*3/uL (ref 0.0–0.1)
Basophils Relative: 0 %
Eosinophils Absolute: 0.2 10*3/uL (ref 0.0–0.5)
Eosinophils Relative: 2 %
HCT: 34.9 % — ABNORMAL LOW (ref 36.0–46.0)
Hemoglobin: 11.6 g/dL — ABNORMAL LOW (ref 12.0–15.0)
Immature Granulocytes: 0 %
Lymphocytes Relative: 21 %
Lymphs Abs: 2.2 10*3/uL (ref 0.7–4.0)
MCH: 32.2 pg (ref 26.0–34.0)
MCHC: 33.2 g/dL (ref 30.0–36.0)
MCV: 96.9 fL (ref 80.0–100.0)
Monocytes Absolute: 0.4 10*3/uL (ref 0.1–1.0)
Monocytes Relative: 4 %
Neutro Abs: 7.9 10*3/uL — ABNORMAL HIGH (ref 1.7–7.7)
Neutrophils Relative %: 73 %
Platelets: 272 10*3/uL (ref 150–400)
RBC: 3.6 MIL/uL — ABNORMAL LOW (ref 3.87–5.11)
RDW: 13.1 % (ref 11.5–15.5)
WBC: 10.7 10*3/uL — ABNORMAL HIGH (ref 4.0–10.5)
nRBC: 0 % (ref 0.0–0.2)

## 2019-07-16 LAB — IRON AND TIBC
Iron: 57 ug/dL (ref 41–142)
Saturation Ratios: 19 % — ABNORMAL LOW (ref 21–57)
TIBC: 295 ug/dL (ref 236–444)
UIBC: 238 ug/dL (ref 120–384)

## 2019-07-16 LAB — FERRITIN: Ferritin: 64 ng/mL (ref 11–307)

## 2019-07-20 ENCOUNTER — Other Ambulatory Visit: Payer: Medicare Other | Admitting: *Deleted

## 2019-07-20 ENCOUNTER — Other Ambulatory Visit: Payer: Self-pay

## 2019-07-20 ENCOUNTER — Telehealth: Payer: Self-pay

## 2019-07-20 DIAGNOSIS — E785 Hyperlipidemia, unspecified: Secondary | ICD-10-CM

## 2019-07-20 LAB — LIPID PANEL
Chol/HDL Ratio: 3.5 ratio (ref 0.0–4.4)
Cholesterol, Total: 122 mg/dL (ref 100–199)
HDL: 35 mg/dL — ABNORMAL LOW (ref >39)
LDL Chol Calc (NIH): 53 mg/dL (ref 0–99)
Triglycerides: 206 mg/dL — ABNORMAL HIGH (ref 0–149)
VLDL Cholesterol Cal: 34 mg/dL (ref 5–40)

## 2019-07-20 NOTE — Telephone Encounter (Signed)
Left message for patient per Dr. Burr Medico lab results iron level is good, mild anemia is stable, no need for iv iron for now.

## 2019-07-20 NOTE — Telephone Encounter (Signed)
-----   Message from Truitt Merle, MD sent at 07/17/2019 11:23 PM EST ----- Please let her know her lab result, mild anemia is stable, iron level good, no need iv iron for now, thanks  Truitt Merle  07/17/2019

## 2019-07-23 ENCOUNTER — Telehealth: Payer: Self-pay | Admitting: *Deleted

## 2019-07-23 DIAGNOSIS — I2510 Atherosclerotic heart disease of native coronary artery without angina pectoris: Secondary | ICD-10-CM

## 2019-07-23 DIAGNOSIS — E785 Hyperlipidemia, unspecified: Secondary | ICD-10-CM

## 2019-07-23 DIAGNOSIS — E7800 Pure hypercholesterolemia, unspecified: Secondary | ICD-10-CM

## 2019-07-23 MED ORDER — OMEGA-3-ACID ETHYL ESTERS 1 G PO CAPS: 2.0000 g | ORAL_CAPSULE | Freq: Two times a day (BID) | ORAL | 3 refills | Status: DC

## 2019-07-23 NOTE — Telephone Encounter (Signed)
Pt has been notified of lab results by phone with verbal understanding. Pt is agreeable to recommendation to start Lovaza 2 g BID, new Rx has been sent in to [facility] per pt request. FLP/LFT set for 11/02/19, pt aware fasting labs. Pt thanked me for the call. Patient notified of result.  Please refer to phone note from today for complete details.   Julaine Hua, CMA 07/23/2019 1:33 PM

## 2019-07-23 NOTE — Telephone Encounter (Signed)
-----   Message from Nuala Alpha, LPN sent at [phone] 12:56 PM EST -----  ----- Message ----- From: Darreld Mclean, PA-C Sent: 07/23/2019  12:50 PM EST To: [facility]v [facility] Triage  Sorry if this is a repeat. I can't tell if I routed it to anyone or not. Please see note below and notify patient of results. Thank you so much!

## 2019-07-28 NOTE — Telephone Encounter (Signed)
Please forward to PharmD for recs

## 2019-07-28 NOTE — Telephone Encounter (Addendum)
Since patient is diabetic, would recommend fenofibrate 48mg  daily for triglyceride lowering and to decrease microvacualr complications. Discontinue Omega 3 as they are not affordable.

## 2019-07-28 NOTE — Telephone Encounter (Signed)
Pt calling stating that her medication Lovaza is too expensive and she needed help or change the medication. Jeani Hawking, LPN can you please assist with this matter? Thank you

## 2019-07-28 NOTE — Telephone Encounter (Signed)
Agree with plan for fenofibrate

## 2019-07-28 NOTE — Telephone Encounter (Signed)
**Note De-Identified Cherylanne Ardelean Obfuscation** I have called the pt at the phone number listed to call her back at several times but got no answer and no way to leave a message.  I called [facility] and s/w Helyn App who advised me that the pt does have a $95 deductible and that once she meets that her Omega-3 Acid Ethyl Esters will still be expensive at $290/90 day supply.  He placed me on hold while he s/w a pharmacist concerning their preferred med. Helyn App came back to our call and stated that Dalene Seltzer is their preferred medication but it will cost the same as Lovaza (Omega-3 Acid Ethyl Esters) because they are both a name brand medication at a tier 4.   I will forward this note to Dr Radford Pax and her nurse for advisement to the pt.

## 2019-07-29 MED ORDER — FENOFIBRATE 48 MG PO TABS: 48.0000 mg | ORAL_TABLET | Freq: Every day | ORAL | 3 refills | Status: DC

## 2019-07-29 NOTE — Telephone Encounter (Signed)
Spoke with patient who agrees to start on fenofibrate. Prescription has been sent. Also advised patient on a low cholesterol diet.

## 2019-07-29 NOTE — Addendum Note (Signed)
Addended by: Antonieta Iba on: 07/29/2019 08:33 AM   Modules accepted: Orders

## 2019-07-29 NOTE — Addendum Note (Signed)
Addended by: Antonieta Iba on: 07/29/2019 08:29 AM   Modules accepted: Orders

## 2019-11-02 ENCOUNTER — Other Ambulatory Visit: Payer: Self-pay

## 2019-11-02 ENCOUNTER — Other Ambulatory Visit: Payer: Medicare Other | Admitting: *Deleted

## 2019-11-02 DIAGNOSIS — E7800 Pure hypercholesterolemia, unspecified: Secondary | ICD-10-CM

## 2019-11-02 DIAGNOSIS — I2510 Atherosclerotic heart disease of native coronary artery without angina pectoris: Secondary | ICD-10-CM

## 2019-11-02 DIAGNOSIS — E785 Hyperlipidemia, unspecified: Secondary | ICD-10-CM

## 2019-11-03 LAB — HEPATIC FUNCTION PANEL
ALT: 15 IU/L (ref 0–32)
AST: 16 IU/L (ref 0–40)
Albumin: 4.4 g/dL (ref 3.8–4.8)
Alkaline Phosphatase: 106 IU/L (ref 39–117)
Bilirubin Total: <0.2 mg/dL (ref 0.0–1.2)
Bilirubin, Direct: 0.05 mg/dL (ref 0.00–0.40)
Total Protein: 6.4 g/dL (ref 6.0–8.5)

## 2019-11-03 LAB — LIPID PANEL
Chol/HDL Ratio: 4.3 ratio (ref 0.0–4.4)
Cholesterol, Total: 156 mg/dL (ref 100–199)
HDL: 36 mg/dL — ABNORMAL LOW (ref >39)
LDL Chol Calc (NIH): 78 mg/dL (ref 0–99)
Triglycerides: 252 mg/dL — ABNORMAL HIGH (ref 0–149)
VLDL Cholesterol Cal: 42 mg/dL — ABNORMAL HIGH (ref 5–40)

## 2019-11-04 ENCOUNTER — Other Ambulatory Visit: Payer: Self-pay

## 2019-11-04 DIAGNOSIS — E785 Hyperlipidemia, unspecified: Secondary | ICD-10-CM

## 2019-11-04 MED ORDER — EZETIMIBE 10 MG PO TABS: 10.0000 mg | ORAL_TABLET | Freq: Every day | ORAL | 3 refills | Status: DC

## 2020-01-07 NOTE — Progress Notes (Signed)
[place]   [phone] [phone]   Clinic Follow up Note   Patient Care Team: Carol Ada, MD as PCP - General (Family Medicine) Sueanne Margarita, MD as PCP - Cardiology (Cardiology) Sueanne Margarita, MD as Consulting Physician (Cardiology)  Date of Service:  01/13/2020  CHIEF COMPLAINT: Follow up anemia   CURRENT THERAPY:  iv Feraheme 510mg  weekly X2 as needed, she received on 03/08/15, 03/15/2015 Oral ferrous sulfate 1 tablet daily  INTERVAL HISTORY:  [name] is here for a follow up of anemia. She was last seen by me 1 year ago. She presents to the clinic alone. She notes she is doing well. She notes no new medical changes. She notes her energy level is adequate. She notes occasional SOB or chest pain without or with activity. She denies obvious bleeding, bloody or black stool. She notes she is in a program to help her lose weight and manage her DM. She gets labs down with program. She notes her LE edema has resolved.    REVIEW OF SYSTEMS:   Constitutional: Denies fevers, chills or abnormal weight loss Eyes: Denies blurriness of vision Ears, nose, mouth, throat, and face: Denies mucositis or sore throat Respiratory: Denies cough wheezes (+) Occasional SOB and chest pain  Cardiovascular: Denies palpitation or lower extremity swelling Gastrointestinal:  Denies nausea, heartburn or change in bowel habits Skin: Denies abnormal skin rashes Lymphatics: Denies new lymphadenopathy or easy bruising Neurological:Denies numbness, tingling or new weaknesses Behavioral/Psych: Mood is stable, no new changes  All other systems were reviewed with the patient and are negative.  MEDICAL HISTORY:  Past Medical History:  Diagnosis Date  . Anemia    "I see dr. Burr Medico @ [facility]; I've had transfusion for low iron (04/17/2016)  . Anxiety   . Arthritis    "left hip, fingers"  (04/17/2016)  . At risk for medication noncompliance   . Atypical chest  pain    a. atypical symptoms since prior cath.  . CAD (coronary artery disease)    a. s/p 2007  LAD stent. b. s/p 07/2006 CABG with LIMA-LAD & SVG to Diag. c. 12/2006 diagonal branch w/ 2.5 x 12 mm Endeavor stent. d. s/p 2010 cath with small vessel disease treated with RX, stable by repeat 2015. e. CP/abnl nuc -> LHC 04/17/16: new ostial stenosis of LIMA-LAD s/p DES, EF 45%, chronically occ SVG-D2. f. Stable cath 12/2016.  Marland Kitchen Chronic combined systolic and diastolic CHF (congestive heart failure) ([place])   . CKD (chronic kidney disease), stage II   . Depression   . GERD (gastroesophageal reflux disease)   . POEUMPNT(614.4)    "maybe once/month" (04/17/2016)  . Hyperlipidemia   . Hypertension   . Migraines    "couple times/yr"  ((04/17/2016)  . Myocardial infarction [facility]) ?2008  . Obesity   . OSA on CPAP    "suppose to wear mask; can't find it" (10/17//2017)  . Osteopenia 12/2010 &01/21/2013   Minimal of hip DEXA   . Pneumonia    "several years" (04/17/2016)  . Retinopathy 12/08   right  . RLS (restless legs syndrome)   . Thyroid disease    "blood work didn't show it but my dr said he can tell by looking at me; I had radiation" (04/17/2016)  . Type II diabetes mellitus ([place])     SURGICAL HISTORY: Past Surgical History:  Procedure Laterality Date  . CARDIAC CATHETERIZATION  02/03/2008   for CP patent LIMA w mid-vessel spasm.  Marland Kitchen  CARDIAC CATHETERIZATION N/A 04/17/2016   Procedure: Left Heart Cath and Cors/Grafts Angiography;  Surgeon: Peter M Martinique, MD;  Location: [place] [facility];  Service: Cardiovascular;  Laterality: N/A;  . CARDIAC CATHETERIZATION N/A 04/17/2016   Procedure: Coronary Stent Intervention;  Surgeon: Peter M Martinique, MD;  Location: [place] [facility];  Service: Cardiovascular;  Laterality: N/A;  . COLONOSCOPY  10/2010   rectal polyp  . CORONARY ANGIOPLASTY WITH STENT PLACEMENT     "I've had quite a few stents"   . CORONARY ANGIOPLASTY WITH STENT PLACEMENT  04/17/2016  .  CORONARY ARTERY BYPASS GRAFT  ?2008   s/p two vessel CABG off pump LAD diagonal and PTCA/DE Stent mid LAD/ DE stent LAD diagonal through previous stent sstruts,occluded SVG-diagonal  . LAPAROSCOPIC CHOLECYSTECTOMY  1990's  . LEFT HEART CATH AND CORS/GRAFTS ANGIOGRAPHY N/A 12/31/2016   Procedure: Left Heart Cath and Cors/Grafts Angiography;  Surgeon: Martinique, Peter M, MD;  Location: [place] [facility];  Service: Cardiovascular;  Laterality: N/A;  . LEFT HEART CATHETERIZATION WITH CORONARY ANGIOGRAM N/A 07/15/2013   Procedure: LEFT HEART CATHETERIZATION WITH CORONARY ANGIOGRAM;  Surgeon: Peter M Martinique, MD;  Location: [facility] C[facility] LAB;  Service: Cardiovascular;  Laterality: N/A;  . TUBAL LIGATION  1976    I have reviewed the social history and family history with the patient and they are unchanged from previous note.  ALLERGIES:  has No Known Allergies.  MEDICATIONS:  Current Outpatient Medications  Medication Sig Dispense Refill  . acetaminophen (TYLENOL) 500 MG tablet Take 2 tablets (1,000 mg total) by mouth every 6 (six) hours as needed for moderate pain or headache.    Marland Kitchen aspirin EC 81 MG tablet Take 1 tablet (81 mg total) by mouth daily.    Marland Kitchen atorvastatin (LIPITOR) 80 MG tablet Take 80 mg by mouth daily.    Marland Kitchen ezetimibe (ZETIA) 10 MG tablet Take 1 tablet (10 mg total) by mouth daily. 90 tablet 3  . fenofibrate (TRICOR) 48 MG tablet Take 1 tablet (48 mg total) by mouth daily. 90 tablet 3  . lisinopril (PRINIVIL,ZESTRIL) 5 MG tablet TAKE 1 TABLET BY MOUTH EVERY DAY 30 tablet 7  . metFORMIN (GLUCOPHAGE) 1000 MG tablet TAKE 1 TABLET BY MOUTH 2 TIMES DAILY WITH A MEAL. 60 tablet 2  . furosemide (LASIX) 20 MG tablet Take 20 mg by mouth daily as needed for fluid. (Patient not taking: Reported on 01/13/2020)    . glimepiride (AMARYL) 4 MG tablet Take 2 mg by mouth 2 (two) times daily.  (Patient not taking: Reported on 01/13/2020)    . HYDROcodone-acetaminophen (NORCO/VICODIN) 5-325 MG tablet Take 1 tablet by  mouth every 4 (four) hours as needed for severe pain. (Patient not taking: Reported on 01/13/2020) 6 tablet 0  . isosorbide mononitrate (IMDUR) 60 MG 24 hr tablet Take 1 tablet (60 mg total) by mouth daily. 90 tablet 3  . metoprolol tartrate (LOPRESSOR) 25 MG tablet Take 1 tablet (25 mg total) by mouth 2 (two) times daily. 180 tablet 3   No current facility-administered medications for this visit.    PHYSICAL EXAMINATION: ECOG PERFORMANCE STATUS: 0 - Asymptomatic  Vitals:   01/13/20 1108 01/13/20 1110  BP: (!) 158/70 118/63  Pulse: 86   Resp: 20   Temp: (!) 97.3 F (36.3 C)   SpO2: 100%    Filed Weights   01/13/20 1108  Weight: 193 lb 4.8 oz (87.7 kg)    Due to COVID19 we will limit examination to appearance. Patient  had no complaints.  GENERAL:alert, no distress and comfortable SKIN: skin color normal, no rashes or significant lesions EYES: normal, Conjunctiva are pink and non-injected, sclera clear  NEURO: alert & oriented x 3 with fluent speech   LABORATORY DATA:  I have reviewed the data as listed CBC Latest Ref Rng & Units 01/13/2020 07/16/2019 01/13/2019  WBC 4.0 - 10.5 K/uL 7.7 10.7(H) 7.7  Hemoglobin 12.0 - 15.0 g/dL 11.6(L) 11.6(L) 11.6(L)  Hematocrit 36 - 46 % 34.4(L) 34.9(L) 34.8(L)  Platelets 150 - 400 K/uL 240 272 228     CMP Latest Ref Rng & Units 11/02/2019 11/07/2017 08/27/2017  Glucose 65 - 99 mg/dL - - -  BUN 8 - 27 mg/dL - - -  Creatinine 0.57 - 1.00 mg/dL - - -  Sodium 134 - 144 mmol/L - - -  Potassium 3.5 - 5.2 mmol/L - - -  Chloride 96 - 106 mmol/L - - -  CO2 20 - 29 mmol/L - - -  Calcium 8.7 - 10.3 mg/dL - - -  Total Protein 6.0 - 8.5 g/dL 6.4 6.1 -  Total Bilirubin 0.0 - 1.2 mg/dL <0.2 0.3 -  Alkaline Phos 39 - 117 IU/L 106 119(H) -  AST 0 - 40 IU/L 16 13 -  ALT 0 - 32 IU/L 15 14 11       RADIOGRAPHIC STUDIES: I have personally reviewed the radiological images as listed and agreed with the findings in the report. No results found.    ASSESSMENT & PLAN:  Elsey Rash Remlinger is a 69 y.o. female with   1. Iron deficient anemia and anemia of chronic disease  -She previously had mild anemia was normal MCV and low reticulocyte count and low serum iron level at 34, low transferrin saturation 9%, normal TIBC 372, ferritin 11. This is supportive for iron deficiency anemia, but her anemia is slightly out of proportion of her iron deficiency. She may have anemia of chronic disease also. -Her folic acid and F63 level were previously normal, SPEP were negative for M protein, TSH was normal, erythropoietin was elevated at 44.8, sedimentation rate was normal, and hemoglobin electrophoresis was normal. -She previously responded very well to IV Feraheme in 03/2015. She is currently on oral iron once daily.  -She is clinically doing well and stable. Labs reviewed, Hg 11.6. Iron panel still pending. I discussed if stable iron she can continue management with her PCP, since she has not need iv iron for the past 5 years. If her anemia worsens she can f/u with me. She is agreeable.    2. Diabetes, CAD, S/p double bypass surgery and stent placements  -She'll continue follow-up with her primary care physician and her cardiologist Dr. Heron Nay -She takes Metformin 1000mg  twice a day  -She notes having occasional SOB and chest pain with or without activity.     Plan -mild anemia is stable, iron study still pending -given her mild anemia and she has not need iv iron for the past 5 years, I will see her as needed in future -Continue monitor CBC and iron lever every 6-12 months with PCP, will copy Dr. Tamala Julian     No problem-specific Assessment & Plan notes found for this encounter.   No orders of the defined types were placed in this encounter.  All questions were answered. The patient knows to call the clinic with any problems, questions or concerns. No barriers to learning was detected. The total time spent in the appointment was 20  minutes.  Truitt Merle, MD 01/13/2020   I, Joslyn Devon, am acting as scribe for Truitt Merle, MD.   I have reviewed the above documentation for accuracy and completeness, and I agree with the above.

## 2020-01-13 ENCOUNTER — Inpatient Hospital Stay (HOSPITAL_BASED_OUTPATIENT_CLINIC_OR_DEPARTMENT_OTHER): Payer: Medicare Other | Admitting: Hematology

## 2020-01-13 ENCOUNTER — Other Ambulatory Visit: Payer: Self-pay

## 2020-01-13 ENCOUNTER — Inpatient Hospital Stay: Payer: Medicare Other | Attending: Hematology

## 2020-01-13 ENCOUNTER — Encounter: Payer: Self-pay | Admitting: Hematology

## 2020-01-13 VITALS — BP 118/63 | HR 86 | Temp 97.3°F | Resp 20 | Ht 59.5 in | Wt 193.3 lb

## 2020-01-13 DIAGNOSIS — K219 Gastro-esophageal reflux disease without esophagitis: Secondary | ICD-10-CM | POA: Diagnosis not present

## 2020-01-13 DIAGNOSIS — G2581 Restless legs syndrome: Secondary | ICD-10-CM | POA: Insufficient documentation

## 2020-01-13 DIAGNOSIS — E079 Disorder of thyroid, unspecified: Secondary | ICD-10-CM | POA: Insufficient documentation

## 2020-01-13 DIAGNOSIS — Z7982 Long term (current) use of aspirin: Secondary | ICD-10-CM | POA: Diagnosis not present

## 2020-01-13 DIAGNOSIS — M8580 Other specified disorders of bone density and structure, unspecified site: Secondary | ICD-10-CM | POA: Insufficient documentation

## 2020-01-13 DIAGNOSIS — E785 Hyperlipidemia, unspecified: Secondary | ICD-10-CM | POA: Diagnosis not present

## 2020-01-13 DIAGNOSIS — I5042 Chronic combined systolic (congestive) and diastolic (congestive) heart failure: Secondary | ICD-10-CM | POA: Diagnosis not present

## 2020-01-13 DIAGNOSIS — I110 Hypertensive heart disease with heart failure: Secondary | ICD-10-CM | POA: Diagnosis not present

## 2020-01-13 DIAGNOSIS — I252 Old myocardial infarction: Secondary | ICD-10-CM | POA: Insufficient documentation

## 2020-01-13 DIAGNOSIS — N182 Chronic kidney disease, stage 2 (mild): Secondary | ICD-10-CM | POA: Diagnosis not present

## 2020-01-13 DIAGNOSIS — D500 Iron deficiency anemia secondary to blood loss (chronic): Secondary | ICD-10-CM

## 2020-01-13 DIAGNOSIS — Z7984 Long term (current) use of oral hypoglycemic drugs: Secondary | ICD-10-CM | POA: Diagnosis not present

## 2020-01-13 DIAGNOSIS — Z79899 Other long term (current) drug therapy: Secondary | ICD-10-CM | POA: Insufficient documentation

## 2020-01-13 DIAGNOSIS — E119 Type 2 diabetes mellitus without complications: Secondary | ICD-10-CM | POA: Diagnosis not present

## 2020-01-13 DIAGNOSIS — G4733 Obstructive sleep apnea (adult) (pediatric): Secondary | ICD-10-CM | POA: Insufficient documentation

## 2020-01-13 DIAGNOSIS — D509 Iron deficiency anemia, unspecified: Secondary | ICD-10-CM

## 2020-01-13 DIAGNOSIS — D508 Other iron deficiency anemias: Secondary | ICD-10-CM

## 2020-01-13 DIAGNOSIS — D638 Anemia in other chronic diseases classified elsewhere: Secondary | ICD-10-CM | POA: Insufficient documentation

## 2020-01-13 LAB — RETICULOCYTES
Immature Retic Fract: 14.5 % (ref 2.3–15.9)
RBC.: 3.68 MIL/uL — ABNORMAL LOW (ref 3.87–5.11)
Retic Count, Absolute: 67.3 10*3/uL (ref 19.0–186.0)
Retic Ct Pct: 1.8 % (ref 0.4–3.1)

## 2020-01-13 LAB — CBC WITH DIFFERENTIAL (CANCER CENTER ONLY)
Abs Immature Granulocytes: 0.12 10*3/uL — ABNORMAL HIGH (ref 0.00–0.07)
Basophils Absolute: 0 10*3/uL (ref 0.0–0.1)
Basophils Relative: 0 %
Eosinophils Absolute: 0.2 10*3/uL (ref 0.0–0.5)
Eosinophils Relative: 2 %
HCT: 34.4 % — ABNORMAL LOW (ref 36.0–46.0)
Hemoglobin: 11.6 g/dL — ABNORMAL LOW (ref 12.0–15.0)
Immature Granulocytes: 2 %
Lymphocytes Relative: 24 %
Lymphs Abs: 1.8 10*3/uL (ref 0.7–4.0)
MCH: 31.5 pg (ref 26.0–34.0)
MCHC: 33.7 g/dL (ref 30.0–36.0)
MCV: 93.5 fL (ref 80.0–100.0)
Monocytes Absolute: 0.3 10*3/uL (ref 0.1–1.0)
Monocytes Relative: 4 %
Neutro Abs: 5.3 10*3/uL (ref 1.7–7.7)
Neutrophils Relative %: 68 %
Platelet Count: 240 10*3/uL (ref 150–400)
RBC: 3.68 MIL/uL — ABNORMAL LOW (ref 3.87–5.11)
RDW: 12.5 % (ref 11.5–15.5)
WBC Count: 7.7 10*3/uL (ref 4.0–10.5)
nRBC: 0 % (ref 0.0–0.2)

## 2020-01-13 LAB — IRON AND TIBC
Iron: 64 ug/dL (ref 41–142)
Saturation Ratios: 20 % — ABNORMAL LOW (ref 21–57)
TIBC: 328 ug/dL (ref 236–444)
UIBC: 264 ug/dL (ref 120–384)

## 2020-01-13 LAB — FERRITIN: Ferritin: 63 ng/mL (ref 11–307)

## 2020-01-14 ENCOUNTER — Telehealth: Payer: Self-pay | Admitting: Hematology

## 2020-01-14 NOTE — Telephone Encounter (Signed)
F/u as needed per 7/14 los.

## 2020-01-19 ENCOUNTER — Telehealth: Payer: Self-pay | Admitting: *Deleted

## 2020-01-19 NOTE — Telephone Encounter (Signed)
-----   Message from Truitt Merle, MD sent at 01/19/2020 10:44 AM EDT ----- Please let pt know her iron level was good and overall stable from last week, no concerns. Thanks   Truitt Merle

## 2020-01-19 NOTE — Telephone Encounter (Signed)
Notified of message below

## 2020-02-17 ENCOUNTER — Inpatient Hospital Stay (HOSPITAL_COMMUNITY): Payer: Medicare Other

## 2020-02-17 ENCOUNTER — Encounter (HOSPITAL_COMMUNITY): Payer: Self-pay | Admitting: Emergency Medicine

## 2020-02-17 ENCOUNTER — Emergency Department (HOSPITAL_COMMUNITY): Payer: Medicare Other

## 2020-02-17 ENCOUNTER — Other Ambulatory Visit: Payer: Self-pay

## 2020-02-17 ENCOUNTER — Inpatient Hospital Stay (HOSPITAL_COMMUNITY)
Admission: EM | Admit: 2020-02-17 | Discharge: 2020-02-21 | DRG: 177 | Disposition: A | Discharge status: Home Health | Payer: Medicare Other | Attending: Internal Medicine | Admitting: Internal Medicine

## 2020-02-17 DIAGNOSIS — Z7984 Long term (current) use of oral hypoglycemic drugs: Secondary | ICD-10-CM

## 2020-02-17 DIAGNOSIS — I130 Hypertensive heart and chronic kidney disease with heart failure and stage 1 through stage 4 chronic kidney disease, or unspecified chronic kidney disease: Secondary | ICD-10-CM | POA: Diagnosis present

## 2020-02-17 DIAGNOSIS — M8580 Other specified disorders of bone density and structure, unspecified site: Secondary | ICD-10-CM | POA: Diagnosis present

## 2020-02-17 DIAGNOSIS — E1122 Type 2 diabetes mellitus with diabetic chronic kidney disease: Secondary | ICD-10-CM | POA: Diagnosis present

## 2020-02-17 DIAGNOSIS — E1165 Type 2 diabetes mellitus with hyperglycemia: Secondary | ICD-10-CM | POA: Diagnosis not present

## 2020-02-17 DIAGNOSIS — G2581 Restless legs syndrome: Secondary | ICD-10-CM | POA: Diagnosis present

## 2020-02-17 DIAGNOSIS — R4182 Altered mental status, unspecified: Secondary | ICD-10-CM | POA: Diagnosis present

## 2020-02-17 DIAGNOSIS — G8324 Monoplegia of upper limb affecting left nondominant side: Secondary | ICD-10-CM | POA: Diagnosis present

## 2020-02-17 DIAGNOSIS — M1612 Unilateral primary osteoarthritis, left hip: Secondary | ICD-10-CM | POA: Diagnosis present

## 2020-02-17 DIAGNOSIS — Z87891 Personal history of nicotine dependence: Secondary | ICD-10-CM

## 2020-02-17 DIAGNOSIS — E119 Type 2 diabetes mellitus without complications: Secondary | ICD-10-CM

## 2020-02-17 DIAGNOSIS — E860 Dehydration: Secondary | ICD-10-CM | POA: Diagnosis present

## 2020-02-17 DIAGNOSIS — M19042 Primary osteoarthritis, left hand: Secondary | ICD-10-CM | POA: Diagnosis present

## 2020-02-17 DIAGNOSIS — E039 Hypothyroidism, unspecified: Secondary | ICD-10-CM | POA: Diagnosis present

## 2020-02-17 DIAGNOSIS — G4733 Obstructive sleep apnea (adult) (pediatric): Secondary | ICD-10-CM | POA: Diagnosis present

## 2020-02-17 DIAGNOSIS — J1282 Pneumonia due to coronavirus disease 2019: Secondary | ICD-10-CM | POA: Diagnosis present

## 2020-02-17 DIAGNOSIS — E11319 Type 2 diabetes mellitus with unspecified diabetic retinopathy without macular edema: Secondary | ICD-10-CM | POA: Diagnosis present

## 2020-02-17 DIAGNOSIS — Z833 Family history of diabetes mellitus: Secondary | ICD-10-CM

## 2020-02-17 DIAGNOSIS — G43909 Migraine, unspecified, not intractable, without status migrainosus: Secondary | ICD-10-CM | POA: Diagnosis present

## 2020-02-17 DIAGNOSIS — Z955 Presence of coronary angioplasty implant and graft: Secondary | ICD-10-CM

## 2020-02-17 DIAGNOSIS — Z820 Family history of epilepsy and other diseases of the nervous system: Secondary | ICD-10-CM

## 2020-02-17 DIAGNOSIS — U071 COVID-19: Principal | ICD-10-CM | POA: Diagnosis present

## 2020-02-17 DIAGNOSIS — I5042 Chronic combined systolic (congestive) and diastolic (congestive) heart failure: Secondary | ICD-10-CM | POA: Diagnosis present

## 2020-02-17 DIAGNOSIS — K219 Gastro-esophageal reflux disease without esophagitis: Secondary | ICD-10-CM | POA: Diagnosis present

## 2020-02-17 DIAGNOSIS — J440 Chronic obstructive pulmonary disease with acute lower respiratory infection: Secondary | ICD-10-CM | POA: Diagnosis present

## 2020-02-17 DIAGNOSIS — N179 Acute kidney failure, unspecified: Secondary | ICD-10-CM | POA: Insufficient documentation

## 2020-02-17 DIAGNOSIS — E669 Obesity, unspecified: Secondary | ICD-10-CM | POA: Diagnosis present

## 2020-02-17 DIAGNOSIS — Z79899 Other long term (current) drug therapy: Secondary | ICD-10-CM

## 2020-02-17 DIAGNOSIS — I252 Old myocardial infarction: Secondary | ICD-10-CM

## 2020-02-17 DIAGNOSIS — M19041 Primary osteoarthritis, right hand: Secondary | ICD-10-CM | POA: Diagnosis present

## 2020-02-17 DIAGNOSIS — Z951 Presence of aortocoronary bypass graft: Secondary | ICD-10-CM

## 2020-02-17 DIAGNOSIS — T380X5A Adverse effect of glucocorticoids and synthetic analogues, initial encounter: Secondary | ICD-10-CM | POA: Diagnosis not present

## 2020-02-17 DIAGNOSIS — E785 Hyperlipidemia, unspecified: Secondary | ICD-10-CM | POA: Diagnosis present

## 2020-02-17 DIAGNOSIS — Z8249 Family history of ischemic heart disease and other diseases of the circulatory system: Secondary | ICD-10-CM

## 2020-02-17 DIAGNOSIS — N182 Chronic kidney disease, stage 2 (mild): Secondary | ICD-10-CM | POA: Diagnosis present

## 2020-02-17 DIAGNOSIS — G459 Transient cerebral ischemic attack, unspecified: Secondary | ICD-10-CM | POA: Diagnosis present

## 2020-02-17 DIAGNOSIS — E872 Acidosis: Secondary | ICD-10-CM | POA: Diagnosis present

## 2020-02-17 DIAGNOSIS — Z7982 Long term (current) use of aspirin: Secondary | ICD-10-CM

## 2020-02-17 DIAGNOSIS — Z801 Family history of malignant neoplasm of trachea, bronchus and lung: Secondary | ICD-10-CM

## 2020-02-17 DIAGNOSIS — I2510 Atherosclerotic heart disease of native coronary artery without angina pectoris: Secondary | ICD-10-CM | POA: Diagnosis present

## 2020-02-17 DIAGNOSIS — Z6838 Body mass index (BMI) 38.0-38.9, adult: Secondary | ICD-10-CM

## 2020-02-17 LAB — TRIGLYCERIDES: Triglycerides: 203 mg/dL — ABNORMAL HIGH (ref <150)

## 2020-02-17 LAB — LACTIC ACID, PLASMA
Lactic Acid, Venous: 2.9 mmol/L (ref 0.5–1.9)
Lactic Acid, Venous: 2.2 mmol/L (ref 0.5–1.9)

## 2020-02-17 LAB — PROTIME-INR
INR: 1 (ref 0.8–1.2)
Prothrombin Time: 12.4 seconds (ref 11.4–15.2)

## 2020-02-17 LAB — COMPREHENSIVE METABOLIC PANEL
ALT: 41 U/L (ref 0–44)
AST: 44 U/L — ABNORMAL HIGH (ref 15–41)
Albumin: 2.9 g/dL — ABNORMAL LOW (ref 3.5–5.0)
Alkaline Phosphatase: 77 U/L (ref 38–126)
Anion gap: 14 (ref 5–15)
BUN: 35 mg/dL — ABNORMAL HIGH (ref 8–23)
CO2: 18 mmol/L — ABNORMAL LOW (ref 22–32)
Calcium: 8.5 mg/dL — ABNORMAL LOW (ref 8.9–10.3)
Chloride: 104 mmol/L (ref 98–111)
Creatinine, Ser: 2.42 mg/dL — ABNORMAL HIGH (ref 0.44–1.00)
GFR calc Af Amer: 23 mL/min — ABNORMAL LOW (ref >60)
GFR calc non Af Amer: 20 mL/min — ABNORMAL LOW (ref >60)
Glucose, Bld: 195 mg/dL — ABNORMAL HIGH (ref 70–99)
Potassium: 3.8 mmol/L (ref 3.5–5.1)
Sodium: 136 mmol/L (ref 135–145)
Total Bilirubin: 0.6 mg/dL (ref 0.3–1.2)
Total Protein: 5.6 g/dL — ABNORMAL LOW (ref 6.5–8.1)

## 2020-02-17 LAB — HEMOGLOBIN A1C
Hgb A1c MFr Bld: 8.4 % — ABNORMAL HIGH (ref 4.8–5.6)
Mean Plasma Glucose: 194.38 mg/dL

## 2020-02-17 LAB — I-STAT CHEM 8, ED
BUN: 46 mg/dL — ABNORMAL HIGH (ref 8–23)
Calcium, Ion: 1.1 mmol/L — ABNORMAL LOW (ref 1.15–1.40)
Chloride: 103 mmol/L (ref 98–111)
Creatinine, Ser: 2.5 mg/dL — ABNORMAL HIGH (ref 0.44–1.00)
Glucose, Bld: 187 mg/dL — ABNORMAL HIGH (ref 70–99)
HCT: 35 % — ABNORMAL LOW (ref 36.0–46.0)
Hemoglobin: 11.9 g/dL — ABNORMAL LOW (ref 12.0–15.0)
Potassium: 4 mmol/L (ref 3.5–5.1)
Sodium: 136 mmol/L (ref 135–145)
TCO2: 20 mmol/L — ABNORMAL LOW (ref 22–32)

## 2020-02-17 LAB — LACTATE DEHYDROGENASE: LDH: 196 U/L — ABNORMAL HIGH (ref 98–192)

## 2020-02-17 LAB — CBC
HCT: 37 % (ref 36.0–46.0)
Hemoglobin: 11.9 g/dL — ABNORMAL LOW (ref 12.0–15.0)
MCH: 30.9 pg (ref 26.0–34.0)
MCHC: 32.2 g/dL (ref 30.0–36.0)
MCV: 96.1 fL (ref 80.0–100.0)
Platelets: 137 10*3/uL — ABNORMAL LOW (ref 150–400)
RBC: 3.85 MIL/uL — ABNORMAL LOW (ref 3.87–5.11)
RDW: 12.9 % (ref 11.5–15.5)
WBC: 6.4 10*3/uL (ref 4.0–10.5)
nRBC: 0 % (ref 0.0–0.2)

## 2020-02-17 LAB — DIFFERENTIAL
Abs Immature Granulocytes: 0.03 10*3/uL (ref 0.00–0.07)
Basophils Absolute: 0 10*3/uL (ref 0.0–0.1)
Basophils Relative: 0 %
Eosinophils Absolute: 0 10*3/uL (ref 0.0–0.5)
Eosinophils Relative: 0 %
Immature Granulocytes: 1 %
Lymphocytes Relative: 10 %
Lymphs Abs: 0.6 10*3/uL — ABNORMAL LOW (ref 0.7–4.0)
Monocytes Absolute: 0.3 10*3/uL (ref 0.1–1.0)
Monocytes Relative: 4 %
Neutro Abs: 5.5 10*3/uL (ref 1.7–7.7)
Neutrophils Relative %: 85 %

## 2020-02-17 LAB — FERRITIN: Ferritin: 769 ng/mL — ABNORMAL HIGH (ref 11–307)

## 2020-02-17 LAB — CBG MONITORING, ED
Glucose-Capillary: 180 mg/dL — ABNORMAL HIGH (ref 70–99)
Glucose-Capillary: 172 mg/dL — ABNORMAL HIGH (ref 70–99)
Glucose-Capillary: 237 mg/dL — ABNORMAL HIGH (ref 70–99)

## 2020-02-17 LAB — APTT: aPTT: 30 seconds (ref 24–36)

## 2020-02-17 LAB — D-DIMER, QUANTITATIVE: D-Dimer, Quant: 1.14 ug/mL-FEU — ABNORMAL HIGH (ref 0.00–0.50)

## 2020-02-17 LAB — C-REACTIVE PROTEIN: CRP: 10.2 mg/dL — ABNORMAL HIGH (ref <1.0)

## 2020-02-17 LAB — PROCALCITONIN: Procalcitonin: 0.14 ng/mL

## 2020-02-17 LAB — TSH: TSH: 0.379 u[IU]/mL (ref 0.350–4.500)

## 2020-02-17 LAB — FIBRINOGEN: Fibrinogen: 693 mg/dL — ABNORMAL HIGH (ref 210–475)

## 2020-02-17 LAB — HIV ANTIBODY (ROUTINE TESTING W REFLEX): HIV Screen 4th Generation wRfx: NONREACTIVE

## 2020-02-17 MED ORDER — INSULIN ASPART 100 UNIT/ML ~~LOC~~ SOLN
0.0000 [IU] | Freq: Every day | SUBCUTANEOUS | Status: DC
  Administered 2020-02-17: 2 [IU] via SUBCUTANEOUS
  Administered 2020-02-18: 5 [IU] via SUBCUTANEOUS
  Administered 2020-02-19: 2 [IU] via SUBCUTANEOUS
  Administered 2020-02-20: 3 [IU] via SUBCUTANEOUS

## 2020-02-17 MED ORDER — HYDRALAZINE HCL 25 MG PO TABS
25.0000 mg | ORAL_TABLET | Freq: Four times a day (QID) | ORAL | Status: DC | PRN
  Administered 2020-02-20: 25 mg via ORAL
  Filled 2020-02-17: qty 1

## 2020-02-17 MED ORDER — METHYLPREDNISOLONE SODIUM SUCC 125 MG IJ SOLR
0.5000 mg/kg | Freq: Two times a day (BID) | INTRAMUSCULAR | Status: DC
  Administered 2020-02-17 – 2020-02-21 (×8): 43.75 mg via INTRAVENOUS
  Filled 2020-02-17 (×8): qty 2

## 2020-02-17 MED ORDER — EZETIMIBE 10 MG PO TABS
10.0000 mg | ORAL_TABLET | Freq: Every day | ORAL | Status: DC
  Administered 2020-02-18 – 2020-02-21 (×4): 10 mg via ORAL
  Filled 2020-02-17 (×5): qty 1

## 2020-02-17 MED ORDER — LACTATED RINGERS IV BOLUS
1000.0000 mL | Freq: Once | INTRAVENOUS | Status: AC
  Administered 2020-02-17: 1000 mL via INTRAVENOUS

## 2020-02-17 MED ORDER — METOPROLOL TARTRATE 25 MG PO TABS
25.0000 mg | ORAL_TABLET | Freq: Two times a day (BID) | ORAL | Status: DC
  Administered 2020-02-18 – 2020-02-21 (×7): 25 mg via ORAL
  Filled 2020-02-17 (×9): qty 1

## 2020-02-17 MED ORDER — IPRATROPIUM-ALBUTEROL 20-100 MCG/ACT IN AERS
1.0000 | INHALATION_SPRAY | Freq: Four times a day (QID) | RESPIRATORY_TRACT | Status: DC
  Administered 2020-02-17 – 2020-02-21 (×16): 1 via RESPIRATORY_TRACT
  Filled 2020-02-17: qty 4

## 2020-02-17 MED ORDER — SODIUM CHLORIDE 0.9% FLUSH: 3.0000 mL | Freq: Once | INTRAVENOUS | Status: DC

## 2020-02-17 MED ORDER — SODIUM CHLORIDE 0.9 % IV SOLN
100.0000 mg | Freq: Every day | INTRAVENOUS | Status: AC
  Administered 2020-02-18 – 2020-02-21 (×4): 100 mg via INTRAVENOUS
  Filled 2020-02-17 (×4): qty 20

## 2020-02-17 MED ORDER — SODIUM CHLORIDE 0.9 % IV SOLN
200.0000 mg | Freq: Once | INTRAVENOUS | Status: AC
  Administered 2020-02-17: 200 mg via INTRAVENOUS
  Filled 2020-02-17: qty 200

## 2020-02-17 MED ORDER — ASPIRIN EC 81 MG PO TBEC
81.0000 mg | DELAYED_RELEASE_TABLET | Freq: Every day | ORAL | Status: DC
  Administered 2020-02-18 – 2020-02-21 (×4): 81 mg via ORAL
  Filled 2020-02-17 (×5): qty 1

## 2020-02-17 MED ORDER — ATORVASTATIN CALCIUM 80 MG PO TABS
80.0000 mg | ORAL_TABLET | Freq: Every day | ORAL | Status: DC
  Administered 2020-02-17 – 2020-02-21 (×5): 80 mg via ORAL
  Filled 2020-02-17 (×5): qty 1

## 2020-02-17 MED ORDER — ACETAMINOPHEN 500 MG PO TABS
1000.0000 mg | ORAL_TABLET | Freq: Four times a day (QID) | ORAL | Status: DC | PRN
  Administered 2020-02-18: 1000 mg via ORAL
  Filled 2020-02-17: qty 2

## 2020-02-17 MED ORDER — INSULIN ASPART 100 UNIT/ML ~~LOC~~ SOLN
0.0000 [IU] | Freq: Three times a day (TID) | SUBCUTANEOUS | Status: DC
  Administered 2020-02-17: 2 [IU] via SUBCUTANEOUS
  Administered 2020-02-18: 3 [IU] via SUBCUTANEOUS
  Administered 2020-02-18: 7 [IU] via SUBCUTANEOUS
  Administered 2020-02-18: 5 [IU] via SUBCUTANEOUS
  Administered 2020-02-19: 3 [IU] via SUBCUTANEOUS
  Administered 2020-02-19 (×2): 5 [IU] via SUBCUTANEOUS
  Administered 2020-02-20 – 2020-02-21 (×4): 1 [IU] via SUBCUTANEOUS

## 2020-02-17 MED ORDER — SENNOSIDES-DOCUSATE SODIUM 8.6-50 MG PO TABS: 1.0000 | ORAL_TABLET | Freq: Every evening | ORAL | Status: DC | PRN

## 2020-02-17 MED ORDER — FENOFIBRATE 54 MG PO TABS
54.0000 mg | ORAL_TABLET | Freq: Every day | ORAL | Status: DC
  Administered 2020-02-18 – 2020-02-21 (×4): 54 mg via ORAL
  Filled 2020-02-17 (×5): qty 1

## 2020-02-17 MED ORDER — HEPARIN SODIUM (PORCINE) 5000 UNIT/ML IJ SOLN
5000.0000 [IU] | Freq: Two times a day (BID) | INTRAMUSCULAR | Status: DC
  Administered 2020-02-17 – 2020-02-21 (×9): 5000 [IU] via SUBCUTANEOUS
  Filled 2020-02-17 (×9): qty 1

## 2020-02-17 MED ORDER — STROKE: EARLY STAGES OF RECOVERY BOOK
Freq: Once | Status: AC
  Administered 2020-02-17: 1
  Filled 2020-02-17 (×2): qty 1

## 2020-02-17 MED ORDER — SODIUM CHLORIDE 0.9 % IV SOLN: INTRAVENOUS | Status: AC

## 2020-02-17 MED ORDER — ISOSORBIDE MONONITRATE ER 30 MG PO TB24
60.0000 mg | ORAL_TABLET | Freq: Every day | ORAL | Status: DC
  Filled 2020-02-17: qty 2

## 2020-02-17 MED ORDER — IOHEXOL 350 MG/ML SOLN
80.0000 mL | Freq: Once | INTRAVENOUS | Status: AC | PRN
  Administered 2020-02-17: 80 mL via INTRAVENOUS

## 2020-02-17 NOTE — ED Notes (Signed)
[phone] daughter [name] would like an update please very worried

## 2020-02-17 NOTE — ED Notes (Signed)
Pure wick has been placed. Suction set to 45mmHg.  

## 2020-02-17 NOTE — H&P (Signed)
History and Physical    [name] RCV:[id number] DOB: 11-Aug-1950 DOA: 02/17/2020  PCP: Carol Ada, MD (Confirm with patient/family/NH records and if not entered, this has to be entered at [facility] point of entry) Patient coming from: Home  I have personally briefly reviewed patient's old medical records in [place]  Chief Complaint: Shortness breath cough, diarrhea and worsening of weakness  HPI: [name] is a 69 y.o. female with medical history significant of chronic iron deficiency anemia, hypothyroidism, CAD with stents x6, hypertension, IDDM, hyperlipidemia, presented with worsening of cough shortness breath and worsening of generalized weakness left more than right.  Her symptoms started 5 to 6 days ago, initially with generalized weakness, dry cough and poor appetite.  Last 4 days, patient has had very little oral intake but only some Jell-O's and Gatorade.  She also has been having loose bowel movement 3-5 bouts a day for last few days.  Denies any fever chills.  No loss of taste no feeling nauseous or vomit.  Patient went to tested positive for Covid 4 days ago.  Overall she has been feeling very weak because of the poor intake, but yesterday daughter was not able to help patient stand up or transfer her from bed to chair and daughter noticed patient to have her left side weaker than the right.  Today patient was really struggling breathing and daughter called EMS ED Course: Code stroke triggered.  CT had showed no acute findings, CT angiogram showed no critical narrowing of blood vessels.  Chest x-ray bilateral multifocal infiltrates compatible with COVID-19 pneumonia.  Creatinine 2.4, BUN 35, glucose 195, bicarb 18.  Review of Systems: As per HPI otherwise 14 point review of systems negative.   Past Medical History:  Diagnosis Date  . Anemia    "I see dr. Burr Medico @ [place]; I've had transfusion for low iron (04/17/2016)  . Anxiety   . Arthritis    "left hip,  fingers"  (04/17/2016)  . At risk for medication noncompliance   . Atypical chest pain    a. atypical symptoms since prior cath.  . CAD (coronary artery disease)    a. s/p 2007  LAD stent. b. s/p 07/2006 CABG with LIMA-LAD & SVG to Diag. c. 12/2006 diagonal branch w/ 2.5 x 12 mm Endeavor stent. d. s/p 2010 cath with small vessel disease treated with RX, stable by repeat 2015. e. CP/abnl nuc -> LHC 04/17/16: new ostial stenosis of LIMA-LAD s/p DES, EF 45%, chronically occ SVG-D2. f. Stable cath 12/2016.  Marland Kitchen Chronic combined systolic and diastolic CHF (congestive heart failure) ([place])   . CKD (chronic kidney disease), stage II   . Depression   . GERD (gastroesophageal reflux disease)   . ZWCHENID(782.4)    "maybe once/month" (04/17/2016)  . Hyperlipidemia   . Hypertension   . Migraines    "couple times/yr"  ((04/17/2016)  . Myocardial infarction [facility]) ?2008  . Obesity   . OSA on CPAP    "suppose to wear mask; can't find it" (10/17//2017)  . Osteopenia 12/2010 &01/21/2013   Minimal of hip DEXA   . Pneumonia    "several years" (04/17/2016)  . Retinopathy 12/08   right  . RLS (restless legs syndrome)   . Thyroid disease    "blood work didn't show it but my dr said he can tell by looking at me; I had radiation" (04/17/2016)  . Type II diabetes mellitus ([place])     Past Surgical History:  Procedure Laterality  Date  . CARDIAC CATHETERIZATION  02/03/2008   for CP patent LIMA w mid-vessel spasm.  Marland Kitchen CARDIAC CATHETERIZATION N/A 04/17/2016   Procedure: Left Heart Cath and Cors/Grafts Angiography;  Surgeon: Peter M Martinique, MD;  Location: [place] [facility];  Service: Cardiovascular;  Laterality: N/A;  . CARDIAC CATHETERIZATION N/A 04/17/2016   Procedure: Coronary Stent Intervention;  Surgeon: Peter M Martinique, MD;  Location: [place] [facility];  Service: Cardiovascular;  Laterality: N/A;  . COLONOSCOPY  10/2010   rectal polyp  . CORONARY ANGIOPLASTY WITH STENT PLACEMENT     "I've had quite a few  stents"   . CORONARY ANGIOPLASTY WITH STENT PLACEMENT  04/17/2016  . CORONARY ARTERY BYPASS GRAFT  ?2008   s/p two vessel CABG off pump LAD diagonal and PTCA/DE Stent mid LAD/ DE stent LAD diagonal through previous stent sstruts,occluded SVG-diagonal  . LAPAROSCOPIC CHOLECYSTECTOMY  1990's  . LEFT HEART CATH AND CORS/GRAFTS ANGIOGRAPHY N/A 12/31/2016   Procedure: Left Heart Cath and Cors/Grafts Angiography;  Surgeon: Martinique, Peter M, MD;  Location: [place] [facility];  Service: Cardiovascular;  Laterality: N/A;  . LEFT HEART CATHETERIZATION WITH CORONARY ANGIOGRAM N/A 07/15/2013   Procedure: LEFT HEART CATHETERIZATION WITH CORONARY ANGIOGRAM;  Surgeon: Peter M Martinique, MD;  Location: [facility] CATH LAB;  Service: Cardiovascular;  Laterality: N/A;  . TUBAL LIGATION  1976     reports that she quit smoking about 30 years ago. Her smoking use included cigarettes. She quit after 25.00 years of use. She has never used smokeless tobacco. She reports that she does not drink alcohol and does not use drugs.  No Known Allergies  Family History  Problem Relation Age of Onset  . Cancer Father        Lung  . Hypertension Mother   . Cancer Mother        Lung  . Diabetes Mother   . Diabetes Maternal Grandmother   . Alzheimer's disease Maternal Grandmother     Prior to Admission medications   Medication Sig Start Date End Date Taking? Authorizing Provider  acetaminophen (TYLENOL) 500 MG tablet Take 2 tablets (1,000 mg total) by mouth every 6 (six) hours as needed for moderate pain or headache. 04/17/16  Yes Dunn, Nedra Hai, PA-C  aspirin EC 81 MG tablet Take 1 tablet (81 mg total) by mouth daily. 04/16/19  Yes Sande Rives E, PA-C  atorvastatin (LIPITOR) 80 MG tablet Take 80 mg by mouth daily.   Yes [provider]  ezetimibe (ZETIA) 10 MG tablet Take 1 tablet (10 mg total) by mouth daily. 11/04/19 02/17/20 Yes Goodrich, Callie E, PA-C  fenofibrate (TRICOR) 48 MG tablet Take 1 tablet (48 mg total) by  mouth daily. 07/29/19  Yes Turner, Eber Hong, MD  furosemide (LASIX) 20 MG tablet Take 20 mg by mouth daily as needed for fluid.    Yes [provider]  glimepiride (AMARYL) 4 MG tablet Take 2 mg by mouth 2 (two) times daily.  01/04/19  Yes [provider]  isosorbide mononitrate (IMDUR) 60 MG 24 hr tablet Take 1 tablet (60 mg total) by mouth daily. 07/15/17 02/17/20 Yes Turner, Eber Hong, MD  lisinopril (PRINIVIL,ZESTRIL) 5 MG tablet TAKE 1 TABLET BY MOUTH EVERY DAY Patient taking differently: Take 5 mg by mouth daily.  08/22/16  Yes Dunn, Dayna N, PA-C  metFORMIN (GLUCOPHAGE) 1000 MG tablet TAKE 1 TABLET BY MOUTH 2 TIMES DAILY WITH A MEAL. Patient taking differently: Take 1,000 mg by mouth 2 (two) times  daily with a meal.  06/11/17  Yes Dunn, Dayna N, PA-C  metoprolol tartrate (LOPRESSOR) 25 MG tablet Take 1 tablet (25 mg total) by mouth 2 (two) times daily. 07/15/17 02/17/20 Yes Turner, Eber Hong, MD  HYDROcodone-acetaminophen (NORCO/VICODIN) 5-325 MG tablet Take 1 tablet by mouth every 4 (four) hours as needed for severe pain. Patient not taking: Reported on 01/13/2020 05/17/19   Charlann Lange, PA-C    Physical Exam: Vitals:   02/17/20 1140 02/17/20 1141 02/17/20 1200 02/17/20 1230  BP:   132/63 128/70  Pulse: 92  87 91  Resp: (!) 33  (!) 34 (!) 33  Temp: 98.2 F (36.8 C)     TempSrc: Oral     SpO2: 94%  97% 95%  Weight:  87.7 kg    Height:  4' 11.5" (1.511 m)      Constitutional: NAD, calm, comfortable Vitals:   02/17/20 1140 02/17/20 1141 02/17/20 1200 02/17/20 1230  BP:   132/63 128/70  Pulse: 92  87 91  Resp: (!) 33  (!) 34 (!) 33  Temp: 98.2 F (36.8 C)     TempSrc: Oral     SpO2: 94%  97% 95%  Weight:  87.7 kg    Height:  4' 11.5" (1.511 m)     Eyes: PERRL, lids and conjunctivae normal ENMT: Mucous membranes are dry. Posterior pharynx clear of any exudate or lesions.Normal dentition.  Neck: normal, supple, no masses, no thyromegaly Respiratory: clear to  auscultation bilaterally, no wheezing, no crackles.  Increased respiratory effort. No accessory muscle use.  Cardiovascular: Regular rate and rhythm, no murmurs / rubs / gallops. No extremity edema. 2+ pedal pulses. No carotid bruits.  Abdomen: no tenderness, no masses palpated. No hepatosplenomegaly. Bowel sounds positive.  Musculoskeletal: no clubbing / cyanosis. No joint deformity upper and lower extremities. Good ROM, no contractures. Normal muscle tone.  Skin: no rashes, lesions, ulcers. No induration Neurologic: No facial droop or tongue deviation, muscle strength slightly weaker on left side compared to the right, light touch sensation grossly intact Psychiatric: Normal judgment and insight. Alert and oriented x 3. Normal mood.   Labs on Admission: I have personally reviewed following labs and imaging studies  CBC: Recent Labs  Lab 02/17/20 1100 02/17/20 1101  WBC 6.4  --   NEUTROABS 5.5  --   HGB 11.9* 11.9*  HCT 37.0 35.0*  MCV 96.1  --   PLT 137*  --    Basic Metabolic Panel: Recent Labs  Lab 02/17/20 1100 02/17/20 1101  NA 136 136  K 3.8 4.0  CL 104 103  CO2 18*  --   GLUCOSE 195* 187*  BUN 35* 46*  CREATININE 2.42* 2.50*  CALCIUM 8.5*  --    GFR: Estimated Creatinine Clearance: 20.4 mL/min (A) (by C-G formula based on SCr of 2.5 mg/dL (H)). Liver Function Tests: Recent Labs  Lab 02/17/20 1100  AST 44*  ALT 41  ALKPHOS 77  BILITOT 0.6  PROT 5.6*  ALBUMIN 2.9*   No results for input(s): LIPASE, AMYLASE in the last 168 hours. No results for input(s): AMMONIA in the last 168 hours. Coagulation Profile: Recent Labs  Lab 02/17/20 1100  INR 1.0   Cardiac Enzymes: No results for input(s): CKTOTAL, CKMB, CKMBINDEX, TROPONINI in the last 168 hours. BNP (last 3 results) No results for input(s): PROBNP in the last 8760 hours. HbA1C: No results for input(s): HGBA1C in the last 72 hours. CBG: Recent Labs  Lab 02/17/20 1101  GLUCAP  180*   Lipid  Profile: Recent Labs    02/17/20 1220  TRIG 203*   Thyroid Function Tests: No results for input(s): TSH, T4TOTAL, FREET4, T3FREE, THYROIDAB in the last 72 hours. Anemia Panel: No results for input(s): VITAMINB12, FOLATE, FERRITIN, TIBC, IRON, RETICCTPCT in the last 72 hours. Urine analysis:    Component Value Date/Time   COLORURINE RED (A) 01/13/2012 1851   APPEARANCEUR CLOUDY (A) 01/13/2012 1851   LABSPEC 1.029 01/13/2012 1851   PHURINE 5.0 01/13/2012 1851   GLUCOSEU NEGATIVE 01/13/2012 1851   HGBUR NEGATIVE 01/13/2012 1851   BILIRUBINUR MODERATE (A) 01/13/2012 1851   KETONESUR 15 (A) 01/13/2012 1851   PROTEINUR 30 (A) 01/13/2012 1851   UROBILINOGEN 1.0 01/13/2012 1851   NITRITE POSITIVE (A) 01/13/2012 1851   LEUKOCYTESUR MODERATE (A) 01/13/2012 1851    Radiological Exams on Admission: DG Chest Portable 1 View  Result Date: 02/17/2020 CLINICAL DATA:  COVID-19 positive.  Altered mental status. EXAM: PORTABLE CHEST 1 VIEW COMPARISON:  11/06/2016 FINDINGS: The cardiomediastinal silhouette is within normal limits status post CABG. There are interstitial densities in both lungs as well as asymmetric patchy opacity in the left lung base. No sizable pleural effusion or pneumothorax is identified. No acute osseous abnormality is seen. IMPRESSION: Left greater than right lung opacities compatible with pneumonia. Electronically Signed   By: Logan Bores M.D.   On: 02/17/2020 11:56   CT HEAD CODE STROKE WO CONTRAST  Result Date: 02/17/2020 CLINICAL DATA:  Code stroke.  Acute neuro deficit. EXAM: CT HEAD WITHOUT CONTRAST TECHNIQUE: Contiguous axial images were obtained from the base of the skull through the vertex without intravenous contrast. COMPARISON:  08/13/2015 FINDINGS: Brain: No evidence of acute infarction, hemorrhage, hydrocephalus, extra-axial collection or mass lesion/mass effect. Well-defined and low-density lacune at the right thalamus on coronal reformats, interval. Vascular:  Atherosclerotic calcifications.  No hyperdense vessel. Skull: Normal. Negative for fracture or focal lesion. Sinuses/Orbits: No acute finding. Other: These results were communicated to Dr. Leonel Ramsay at [facility] 8/18/2021by text page via the [facility] messaging system. ASPECTS [facility] Stroke Program Early CT Score) Not scored with this limited history. IMPRESSION: 1. No acute finding. 2. Remote lacunar infarct at the right thalamus. Electronically Signed   By: Monte Fantasia M.D.   On: 02/17/2020 11:12   CT ANGIO HEAD CODE STROKE  Result Date: 02/17/2020 CLINICAL DATA:  Transient left-sided weakness. EXAM: CT ANGIOGRAPHY HEAD AND NECK TECHNIQUE: Multidetector CT imaging of the head and neck was performed using the standard protocol during bolus administration of intravenous contrast. Multiplanar CT image reconstructions and MIPs were obtained to evaluate the vascular anatomy. Carotid stenosis measurements (when applicable) are obtained utilizing NASCET criteria, using the distal internal carotid diameter as the denominator. CONTRAST:  Dose is currently not known. COMPARISON:  Head CT from earlier today FINDINGS: CTA NECK FINDINGS Aortic arch: Atheromatous plaque.  CABG. Right carotid system: Calcified plaque at the bifurcation and proximal ICA without flow limiting stenosis or ulceration. Left carotid system: Mild calcified plaque at the bifurcation. No ulceration or stenosis. Vertebral arteries: No proximal subclavian stenosis. Relatively codominant vertebral arteries that are smooth and widely patent to the dura. Skeleton: Unremarkable Other neck: Nodular thyroid not well evaluated on this arterial study but there is asymmetric thickening of the right lobe to 3 cm. Nodular thyroid with noted on a 2010 ultrasound. Direct comparison is not possible. Upper chest: Patchy airspace disease in the upper lungs. There is history of COVID-19. Review of the MIP images confirms the  above findings CTA HEAD FINDINGS  Anterior circulation: Atheromatous plaque on the carotid siphons. No branch occlusion, beading, or aneurysm. Posterior circulation: Codominant vertebral arteries. The vertebral and basilar arteries are smooth and widely patent. Atheromatous irregularity of bilateral PCA with moderate to advanced narrowings at the left P2 segment. Venous sinuses: Patent as permitted by contrast timing. Anatomic variants: None significant Review of the MIP images confirms the above findings IMPRESSION: 1. No emergent finding including large vessel occlusion. 2. No proximal flow limiting stenosis or embolic source. 3. These at least moderate left P2 segment stenosis. 4. Patchy bilateral pneumonia. 5. Nodular thyroid. Recommend thyroid US if appropriate for comorbidities. (Ref: J Am Coll Radiol. 2015 Feb;12(2): 143-50). Electronically Signed   By: Monte Fantasia M.D.   On: 02/17/2020 11:30   CT ANGIO NECK CODE STROKE  Result Date: 02/17/2020 CLINICAL DATA:  Transient left-sided weakness. EXAM: CT ANGIOGRAPHY HEAD AND NECK TECHNIQUE: Multidetector CT imaging of the head and neck was performed using the standard protocol during bolus administration of intravenous contrast. Multiplanar CT image reconstructions and MIPs were obtained to evaluate the vascular anatomy. Carotid stenosis measurements (when applicable) are obtained utilizing NASCET criteria, using the distal internal carotid diameter as the denominator. CONTRAST:  Dose is currently not known. COMPARISON:  Head CT from earlier today FINDINGS: CTA NECK FINDINGS Aortic arch: Atheromatous plaque.  CABG. Right carotid system: Calcified plaque at the bifurcation and proximal ICA without flow limiting stenosis or ulceration. Left carotid system: Mild calcified plaque at the bifurcation. No ulceration or stenosis. Vertebral arteries: No proximal subclavian stenosis. Relatively codominant vertebral arteries that are smooth and widely patent to the dura. Skeleton: Unremarkable Other  neck: Nodular thyroid not well evaluated on this arterial study but there is asymmetric thickening of the right lobe to 3 cm. Nodular thyroid with noted on a 2010 ultrasound. Direct comparison is not possible. Upper chest: Patchy airspace disease in the upper lungs. There is history of COVID-19. Review of the MIP images confirms the above findings CTA HEAD FINDINGS Anterior circulation: Atheromatous plaque on the carotid siphons. No branch occlusion, beading, or aneurysm. Posterior circulation: Codominant vertebral arteries. The vertebral and basilar arteries are smooth and widely patent. Atheromatous irregularity of bilateral PCA with moderate to advanced narrowings at the left P2 segment. Venous sinuses: Patent as permitted by contrast timing. Anatomic variants: None significant Review of the MIP images confirms the above findings IMPRESSION: 1. No emergent finding including large vessel occlusion. 2. No proximal flow limiting stenosis or embolic source. 3. These at least moderate left P2 segment stenosis. 4. Patchy bilateral pneumonia. 5. Nodular thyroid. Recommend thyroid US if appropriate for comorbidities. (Ref: J Am Coll Radiol. 2015 Feb;12(2): 143-50). Electronically Signed   By: Monte Fantasia M.D.   On: 02/17/2020 11:30    EKG: Independently reviewed.  No acute ST changes Assessment/Plan Active Problems:   COVID-19 virus infection   TIA (transient ischemic attack)  (please populate well all problems here in Problem List. (For example, if patient is on BP meds at home and you resume or decide to hold them, it is a problem that needs to be her. Same for CAD, COPD, HLD and so on)  Left-sided paresis -Stroke versus COVID-19 viral syndrome. -Patient on aspirin and multiple lipid medications -MRI-patient breathing status more stabilized -PT OT evaluation  COVID-19 pneumonia with impending respiratory failure -Start remdesivir and steroid -Discussed with patient bedside and daughter over the  phone regarding COVID-19 treatment and prone position as tolerated -D-dimer and  other labs pending  AKI -Start maintenance IV fluid -UA and Renal U/S   IIDM -Hold Metformin for 72 hours -Sliding scale for now  HTN -Hold BP meds until dehydration and AKI improved  Thyroid nodules -Accidental finding, check TSH, outpatient thyroid ultrasound    DVT prophylaxis: Heparin subcu Code Status: Full code Family Communication: Daughter over the phone Disposition Plan: Expect more than 2 midnight hospital stay to treat COVID-19 and stroke work-up Consults called: Neurology Admission status: Telemetry admission   Lequita Halt MD [facility] Pager [phone]  02/17/2020, 1:38 PM

## 2020-02-17 NOTE — ED Triage Notes (Signed)
Pt BIB [facility] from home. While EMS in route to [facility] L sided weakness noted. Pt LSW 1900 last night when she went to bed. Pt has gen weakness and increasing confusion for past 3 days per family. Pt covid +

## 2020-02-17 NOTE — Code Documentation (Signed)
Stroke Response Nurse Documentation Code Documentation  Diamante Rash Morel is a 69 y.o. female arriving to [facility] ED via [facility] on 8/18 with past medical hx of COVID. Code stroke was activated by EMS. Patient from home where she was LKW at 1900 yesterday evening when she went to bed and now complaining of generalized weakness with L >R and aphasia. Stroke team at the bedside on patient arrival. Labs drawn and patient cleared for CT by Dr. Regenia Skeeter. Patient to CT with team. NIHSS 5, see documentation for details and code stroke times. Patient with disoriented, left arm weakness, bilateral leg weakness and Expressive aphasia  on exam. The following imaging was completed: CT/CTA Patient is not a candidate for tPA due to being outside the window. Care/Plan: Q2 Hour mNIHSS/VS. Bedside handoff with ED RN Maudie Mercury.    Kathrin Greathouse  Stroke Response RN

## 2020-02-17 NOTE — ED Provider Notes (Signed)
[facility] EMERGENCY DEPARTMENT Provider Note   CSN: [id number] Arrival date & time: 02/17/20  1052  LEVEL 5 CAVEAT - ALTERED MENTAL STATUS  History Chief Complaint  Patient presents with  . Code Stroke    [place] Reifsteck is a 69 y.o. female.  HPI 69 year old female presents with lethargy.  Called as a code stroke because EMS noticed a sudden onset of left-sided weakness.  The lethargy/weakness started over the past couple days but then she had some trouble speaking today per daughter. No focal weakness now, but had some in the left arm during EMS arrival.  Is now gone. Covid positive 4 days ago. Has a cough.    Past Medical History:  Diagnosis Date  . Anemia    "I see dr. Burr Medico @ [place]; I've had transfusion for low iron (04/17/2016)  . Anxiety   . Arthritis    "left hip, fingers"  (04/17/2016)  . At risk for medication noncompliance   . Atypical chest pain    a. atypical symptoms since prior cath.  . CAD (coronary artery disease)    a. s/p 2007  LAD stent. b. s/p 07/2006 CABG with LIMA-LAD & SVG to Diag. c. 12/2006 diagonal branch w/ 2.5 x 12 mm Endeavor stent. d. s/p 2010 cath with small vessel disease treated with RX, stable by repeat 2015. e. CP/abnl nuc -> LHC 04/17/16: new ostial stenosis of LIMA-LAD s/p DES, EF 45%, chronically occ SVG-D2. f. Stable cath 12/2016.  Marland Kitchen Chronic combined systolic and diastolic CHF (congestive heart failure) ([place])   . CKD (chronic kidney disease), stage II   . Depression   . GERD (gastroesophageal reflux disease)   . WLNLGXQJ(194.1)    "maybe once/month" (04/17/2016)  . Hyperlipidemia   . Hypertension   . Migraines    "couple times/yr"  ((04/17/2016)  . Myocardial infarction [facility]) ?2008  . Obesity   . OSA on CPAP    "suppose to wear mask; can't find it" (10/17//2017)  . Osteopenia 12/2010 &01/21/2013   Minimal of hip DEXA   . Pneumonia    "several years" (04/17/2016)  . Retinopathy 12/08   right  . RLS  (restless legs syndrome)   . Thyroid disease    "blood work didn't show it but my dr said he can tell by looking at me; I had radiation" (04/17/2016)  . Type II diabetes mellitus [facility])     Patient Active Problem List   Diagnosis Date Noted  . Chronic diastolic CHF (congestive heart failure) ([place]) 07/14/2017  . Iron deficiency anemia 10/11/2015  . Anemia 12/21/2014  . SOB (shortness of breath) 12/03/2014  . Graves disease 07/14/2013  . Hyperlipidemia 07/14/2013  . Diabetes mellitus type 2, noninsulin dependent ([place]) 07/14/2013  . Hypertension 07/14/2013  . GERD (gastroesophageal reflux disease) 07/14/2013  . CAD (coronary artery disease)   . Obesity (BMI 30-39.9)     Past Surgical History:  Procedure Laterality Date  . CARDIAC CATHETERIZATION  02/03/2008   for CP patent LIMA w mid-vessel spasm.  Marland Kitchen CARDIAC CATHETERIZATION N/A 04/17/2016   Procedure: Left Heart Cath and Cors/Grafts Angiography;  Surgeon: Peter M Martinique, MD;  Location: [place] CV LAB;  Service: Cardiovascular;  Laterality: N/A;  . CARDIAC CATHETERIZATION N/A 04/17/2016   Procedure: Coronary Stent Intervention;  Surgeon: Peter M Martinique, MD;  Location: [place] CV LAB;  Service: Cardiovascular;  Laterality: N/A;  . COLONOSCOPY  10/2010   rectal polyp  . CORONARY ANGIOPLASTY WITH STENT PLACEMENT     "  I've had quite a few stents"   . CORONARY ANGIOPLASTY WITH STENT PLACEMENT  04/17/2016  . CORONARY ARTERY BYPASS GRAFT  ?2008   s/p two vessel CABG off pump LAD diagonal and PTCA/DE Stent mid LAD/ DE stent LAD diagonal through previous stent sstruts,occluded SVG-diagonal  . LAPAROSCOPIC CHOLECYSTECTOMY  1990's  . LEFT HEART CATH AND CORS/GRAFTS ANGIOGRAPHY N/A 12/31/2016   Procedure: Left Heart Cath and Cors/Grafts Angiography;  Surgeon: Martinique, Peter M, MD;  Location: [facility] LAB;  Service: Cardiovascular;  Laterality: N/A;  . LEFT HEART CATHETERIZATION WITH CORONARY ANGIOGRAM N/A 07/15/2013   Procedure: LEFT  HEART CATHETERIZATION WITH CORONARY ANGIOGRAM;  Surgeon: Peter M Martinique, MD;  Location: [facility] CATH LAB;  Service: Cardiovascular;  Laterality: N/A;  . TUBAL LIGATION  1976     OB History   No obstetric history on file.     Family History  Problem Relation Age of Onset  . Cancer Father        Lung  . Hypertension Mother   . Cancer Mother        Lung  . Diabetes Mother   . Diabetes Maternal Grandmother   . Alzheimer's disease Maternal Grandmother     Social History   Tobacco Use  . Smoking status: Former Smoker    Years: 25.00    Types: Cigarettes    Quit date: 07/02/1989    Years since quitting: 30.6  . Smokeless tobacco: Never Used  . Tobacco comment: "smoked 2-3 cigarettes/week"  Substance Use Topics  . Alcohol use: No  . Drug use: No    Home Medications Prior to Admission medications   Medication Sig Start Date End Date Taking? Authorizing Provider  acetaminophen (TYLENOL) 500 MG tablet Take 2 tablets (1,000 mg total) by mouth every 6 (six) hours as needed for moderate pain or headache. 04/17/16   Charlie Pitter, PA-C  aspirin EC 81 MG tablet Take 1 tablet (81 mg total) by mouth daily. 04/16/19   Sande Rives E, PA-C  atorvastatin (LIPITOR) 80 MG tablet Take 80 mg by mouth daily.    [provider]  ezetimibe (ZETIA) 10 MG tablet Take 1 tablet (10 mg total) by mouth daily. 11/04/19 02/02/20  Sande Rives E, PA-C  fenofibrate (TRICOR) 48 MG tablet Take 1 tablet (48 mg total) by mouth daily. 07/29/19   Sueanne Margarita, MD  furosemide (LASIX) 20 MG tablet Take 20 mg by mouth daily as needed for fluid. Patient not taking: Reported on 01/13/2020    [provider]  glimepiride (AMARYL) 4 MG tablet Take 2 mg by mouth 2 (two) times daily.  Patient not taking: Reported on 01/13/2020 01/04/19   [provider]  HYDROcodone-acetaminophen (NORCO/VICODIN) 5-325 MG tablet Take 1 tablet by mouth every 4 (four) hours as needed for severe pain. Patient not  taking: Reported on 01/13/2020 05/17/19   Charlann Lange, PA-C  isosorbide mononitrate (IMDUR) 60 MG 24 hr tablet Take 1 tablet (60 mg total) by mouth daily. 07/15/17 06/05/19  Sueanne Margarita, MD  lisinopril (PRINIVIL,ZESTRIL) 5 MG tablet TAKE 1 TABLET BY MOUTH EVERY DAY 08/22/16   Dunn, Lisbeth Renshaw N, PA-C  metFORMIN (GLUCOPHAGE) 1000 MG tablet TAKE 1 TABLET BY MOUTH 2 TIMES DAILY WITH A MEAL. 06/11/17   Dunn, Nedra Hai, PA-C  metoprolol tartrate (LOPRESSOR) 25 MG tablet Take 1 tablet (25 mg total) by mouth 2 (two) times daily. 07/15/17 06/05/19  Sueanne Margarita, MD    Allergies    Patient has  no known allergies.  Review of Systems   Review of Systems  Unable to perform ROS: Mental status change    Physical Exam Updated Vital Signs BP (!) 119/56   Pulse 92   Temp 98.2 F (36.8 C) (Oral)   Resp (!) 33   Ht 4' 11.5" (1.511 m)   Wt 87.7 kg   SpO2 94%   BMI 38.40 kg/m   Physical Exam Vitals and nursing note reviewed.  Constitutional:      Appearance: She is well-developed. She is obese.  HENT:     Head: Normocephalic and atraumatic.     Right Ear: External ear normal.     Left Ear: External ear normal.     Nose: Nose normal.  Eyes:     General:        Right eye: No discharge.        Left eye: No discharge.  Cardiovascular:     Rate and Rhythm: Normal rate and regular rhythm.     Heart sounds: Normal heart sounds.  Pulmonary:     Effort: Pulmonary effort is normal.     Breath sounds: Normal breath sounds.  Abdominal:     Palpations: Abdomen is soft.     Tenderness: There is no abdominal tenderness.  Skin:    General: Skin is warm and dry.  Neurological:     Mental Status: She is alert. She is disoriented.     Comments: She is awake but confused. No slurred speech.  5/5 strength in all 4 extremities.  Psychiatric:        Mood and Affect: Mood is not anxious.     ED Results / Procedures / Treatments   Labs (all labs ordered are listed, but only abnormal results are  displayed) Labs Reviewed  CBC - Abnormal; Notable for the following components:      Result Value   RBC 3.85 (*)    Hemoglobin 11.9 (*)    Platelets 137 (*)    All other components within normal limits  DIFFERENTIAL - Abnormal; Notable for the following components:   Lymphs Abs 0.6 (*)    All other components within normal limits  COMPREHENSIVE METABOLIC PANEL - Abnormal; Notable for the following components:   CO2 18 (*)    Glucose, Bld 195 (*)    BUN 35 (*)    Creatinine, Ser 2.42 (*)    Calcium 8.5 (*)    Total Protein 5.6 (*)    Albumin 2.9 (*)    AST 44 (*)    GFR calc non Af Amer 20 (*)    GFR calc Af Amer 23 (*)    All other components within normal limits  I-STAT CHEM 8, ED - Abnormal; Notable for the following components:   BUN 46 (*)    Creatinine, Ser 2.50 (*)    Glucose, Bld 187 (*)    Calcium, Ion 1.10 (*)    TCO2 20 (*)    Hemoglobin 11.9 (*)    HCT 35.0 (*)    All other components within normal limits  CBG MONITORING, ED - Abnormal; Notable for the following components:   Glucose-Capillary 180 (*)    All other components within normal limits  CULTURE, BLOOD (ROUTINE X 2)  CULTURE, BLOOD (ROUTINE X 2)  PROTIME-INR  APTT  LACTIC ACID, PLASMA  LACTIC ACID, PLASMA  D-DIMER, QUANTITATIVE (NOT AT [facility])  PROCALCITONIN  LACTATE DEHYDROGENASE  FERRITIN  TRIGLYCERIDES  FIBRINOGEN  C-REACTIVE PROTEIN    EKG  EKG Interpretation  Date/Time:  Wednesday February 17 2020 11:35:07 EDT Ventricular Rate:  94 PR Interval:    QRS Duration: 86 QT Interval:  352 QTC Calculation: 441 R Axis:   58 Text Interpretation: Sinus rhythm no acute ST/T changes Confirmed by Sherwood Gambler [phone]) on 02/17/2020 12:21:18 PM   Radiology DG Chest Portable 1 View  Result Date: 02/17/2020 CLINICAL DATA:  COVID-19 positive.  Altered mental status. EXAM: PORTABLE CHEST 1 VIEW COMPARISON:  11/06/2016 FINDINGS: The cardiomediastinal silhouette is within normal limits status post  CABG. There are interstitial densities in both lungs as well as asymmetric patchy opacity in the left lung base. No sizable pleural effusion or pneumothorax is identified. No acute osseous abnormality is seen. IMPRESSION: Left greater than right lung opacities compatible with pneumonia. Electronically Signed   By: Logan Bores M.D.   On: 02/17/2020 11:56   CT HEAD CODE STROKE WO CONTRAST  Result Date: 02/17/2020 CLINICAL DATA:  Code stroke.  Acute neuro deficit. EXAM: CT HEAD WITHOUT CONTRAST TECHNIQUE: Contiguous axial images were obtained from the base of the skull through the vertex without intravenous contrast. COMPARISON:  08/13/2015 FINDINGS: Brain: No evidence of acute infarction, hemorrhage, hydrocephalus, extra-axial collection or mass lesion/mass effect. Well-defined and low-density lacune at the right thalamus on coronal reformats, interval. Vascular: Atherosclerotic calcifications.  No hyperdense vessel. Skull: Normal. Negative for fracture or focal lesion. Sinuses/Orbits: No acute finding. Other: These results were communicated to Dr. Leonel Ramsay at [place] 8/18/2021by text page via the [facility] messaging system. ASPECTS [facility] Stroke Program Early CT Score) Not scored with this limited history. IMPRESSION: 1. No acute finding. 2. Remote lacunar infarct at the right thalamus. Electronically Signed   By: Monte Fantasia M.D.   On: 02/17/2020 11:12   CT ANGIO HEAD CODE STROKE  Result Date: 02/17/2020 CLINICAL DATA:  Transient left-sided weakness. EXAM: CT ANGIOGRAPHY HEAD AND NECK TECHNIQUE: Multidetector CT imaging of the head and neck was performed using the standard protocol during bolus administration of intravenous contrast. Multiplanar CT image reconstructions and MIPs were obtained to evaluate the vascular anatomy. Carotid stenosis measurements (when applicable) are obtained utilizing NASCET criteria, using the distal internal carotid diameter as the denominator. CONTRAST:  Dose is  currently not known. COMPARISON:  Head CT from earlier today FINDINGS: CTA NECK FINDINGS Aortic arch: Atheromatous plaque.  CABG. Right carotid system: Calcified plaque at the bifurcation and proximal ICA without flow limiting stenosis or ulceration. Left carotid system: Mild calcified plaque at the bifurcation. No ulceration or stenosis. Vertebral arteries: No proximal subclavian stenosis. Relatively codominant vertebral arteries that are smooth and widely patent to the dura. Skeleton: Unremarkable Other neck: Nodular thyroid not well evaluated on this arterial study but there is asymmetric thickening of the right lobe to 3 cm. Nodular thyroid with noted on a 2010 ultrasound. Direct comparison is not possible. Upper chest: Patchy airspace disease in the upper lungs. There is history of COVID-19. Review of the MIP images confirms the above findings CTA HEAD FINDINGS Anterior circulation: Atheromatous plaque on the carotid siphons. No branch occlusion, beading, or aneurysm. Posterior circulation: Codominant vertebral arteries. The vertebral and basilar arteries are smooth and widely patent. Atheromatous irregularity of bilateral PCA with moderate to advanced narrowings at the left P2 segment. Venous sinuses: Patent as permitted by contrast timing. Anatomic variants: None significant Review of the MIP images confirms the above findings IMPRESSION: 1. No emergent finding including large vessel occlusion. 2. No proximal flow limiting stenosis or embolic source. 3. These  at least moderate left P2 segment stenosis. 4. Patchy bilateral pneumonia. 5. Nodular thyroid. Recommend thyroid US if appropriate for comorbidities. (Ref: J Am Coll Radiol. 2015 Feb;12(2): 143-50). Electronically Signed   By: Monte Fantasia M.D.   On: 02/17/2020 11:30   CT ANGIO NECK CODE STROKE  Result Date: 02/17/2020 CLINICAL DATA:  Transient left-sided weakness. EXAM: CT ANGIOGRAPHY HEAD AND NECK TECHNIQUE: Multidetector CT imaging of the head  and neck was performed using the standard protocol during bolus administration of intravenous contrast. Multiplanar CT image reconstructions and MIPs were obtained to evaluate the vascular anatomy. Carotid stenosis measurements (when applicable) are obtained utilizing NASCET criteria, using the distal internal carotid diameter as the denominator. CONTRAST:  Dose is currently not known. COMPARISON:  Head CT from earlier today FINDINGS: CTA NECK FINDINGS Aortic arch: Atheromatous plaque.  CABG. Right carotid system: Calcified plaque at the bifurcation and proximal ICA without flow limiting stenosis or ulceration. Left carotid system: Mild calcified plaque at the bifurcation. No ulceration or stenosis. Vertebral arteries: No proximal subclavian stenosis. Relatively codominant vertebral arteries that are smooth and widely patent to the dura. Skeleton: Unremarkable Other neck: Nodular thyroid not well evaluated on this arterial study but there is asymmetric thickening of the right lobe to 3 cm. Nodular thyroid with noted on a 2010 ultrasound. Direct comparison is not possible. Upper chest: Patchy airspace disease in the upper lungs. There is history of COVID-19. Review of the MIP images confirms the above findings CTA HEAD FINDINGS Anterior circulation: Atheromatous plaque on the carotid siphons. No branch occlusion, beading, or aneurysm. Posterior circulation: Codominant vertebral arteries. The vertebral and basilar arteries are smooth and widely patent. Atheromatous irregularity of bilateral PCA with moderate to advanced narrowings at the left P2 segment. Venous sinuses: Patent as permitted by contrast timing. Anatomic variants: None significant Review of the MIP images confirms the above findings IMPRESSION: 1. No emergent finding including large vessel occlusion. 2. No proximal flow limiting stenosis or embolic source. 3. These at least moderate left P2 segment stenosis. 4. Patchy bilateral pneumonia. 5. Nodular  thyroid. Recommend thyroid US if appropriate for comorbidities. (Ref: J Am Coll Radiol. 2015 Feb;12(2): 143-50). Electronically Signed   By: Monte Fantasia M.D.   On: 02/17/2020 11:30    Procedures Procedures (including critical care time)  Medications Ordered in ED Medications  sodium chloride flush (NS) 0.9 % injection 3 mL (3 mLs Intravenous Not Given 02/17/20 1143)  lactated ringers bolus 1,000 mL (has no administration in time range)  iohexol (OMNIPAQUE) 350 MG/ML injection 80 mL (80 mLs Intravenous Contrast Given 02/17/20 1134)    ED Course  I have reviewed the triage vital signs and the nursing notes.  Pertinent labs & imaging results that were available during my care of the patient were reviewed by me and considered in my medical decision making (see chart for details).    MDM Rules/Calculators/A&P                          Patient was called a code stroke by EMS but this was canceled after it became apparent that she is not a TPA candidate.  CTA was performed via neurology and does not show large vessel occlusion.  Unclear exact onset of her kidney injury though given no recent labs will presume it is fairly recent with her current Covid diagnosis and altered mental status.  Will give a fluid bolus to see if this helps.  Otherwise, there  does not appear to be an obvious bacterial cause but she will need admission for further stroke work-up/TIA work-up.  Dr. Roosevelt Locks to admit.  Syesha Rash Washinton was evaluated in Emergency Department on 02/17/2020 for the symptoms described in the history of present illness. She was evaluated in the context of the global COVID-19 pandemic, which necessitated consideration that the patient might be at risk for infection with the SARS-CoV-2 virus that causes COVID-19. Institutional protocols and algorithms that pertain to the evaluation of patients at risk for COVID-19 are in a state of rapid change based on information released by regulatory bodies including  the CDC and federal and state organizations. These policies and algorithms were followed during the patient's care in the ED.  Final Clinical Impression(s) / ED Diagnoses Final diagnoses:  Altered mental status, unspecified altered mental status type  COVID-19 virus infection    Rx / DC Orders ED Discharge Orders    None       Sherwood Gambler, MD 02/17/20 1224

## 2020-02-17 NOTE — Consult Note (Addendum)
Neurology Consultation  Reason for Consult: Code stroke Referring Physician: Dr. Regenia Skeeter  CC: Difficulty with speech and left-sided weakness  History is obtained from: Daughter and EMS  HPI: [name] is a 69 y.o. female.  This is a patient with past medical history of diabetes, myocardial infarct, migraines, hypertension, hyperlipidemia, CKD, and CAD.  Patient was recently diagnosed with Covid 4 days ago.  Per daughter she has been for the past 3 days and progressively more confused.  Patient went to bed last night at approximately 7 PM.  Upon waking it was noted that patient was very confused and also had some difficulties with expressing herself.  Patient was picked up by EMS to be brought to [facility] due to her confusion, weakness and possible progression of Covid.  Upon transport patient was noted to have difficulty expressing herself and left-sided weakness.  She was then diverted to [facility] for possible stroke.  Patient arrived 10 minutes after event occurred.  On exam initially was noted that she did not answer questions correctly she had a left arm drift and bilateral leg drift along with mild aphasia.  CTA head, neck.  LKW: Unclear tpa given?: no, out of window Premorbid modified Rankin scale (mRS): 0  NIHSS 1a Level of Conscious.: 0 1b LOC Questions: 1 1c LOC Commands: 0 2 Best Gaze: 0 3 Visual: 0 4 Facial Palsy: 0 5a Motor Arm - left: 1 5b Motor Arm - Right: 0 6a Motor Leg - Left: 1 6b Motor Leg - Right: 1 7 Limb Ataxia: 0 8 Sensory: 0 9 Best Language: 1 10 Dysarthria: 0 11 Extinct. and Inatten.: 0 TOTAL: 5    Past Medical History:  Diagnosis Date  . Anemia    "I see dr. Burr Medico @ [place]; I've had transfusion for low iron (04/17/2016)  . Anxiety   . Arthritis    "left hip, fingers"  (04/17/2016)  . At risk for medication noncompliance   . Atypical chest pain    a. atypical symptoms since prior cath.  . CAD (coronary artery disease)     a. s/p 2007  LAD stent. b. s/p 07/2006 CABG with LIMA-LAD & SVG to Diag. c. 12/2006 diagonal branch w/ 2.5 x 12 mm Endeavor stent. d. s/p 2010 cath with small vessel disease treated with RX, stable by repeat 2015. e. CP/abnl nuc -> LHC 04/17/16: new ostial stenosis of LIMA-LAD s/p DES, EF 45%, chronically occ SVG-D2. f. Stable cath 12/2016.  Marland Kitchen Chronic combined systolic and diastolic CHF (congestive heart failure) ([place])   . CKD (chronic kidney disease), stage II   . Depression   . GERD (gastroesophageal reflux disease)   . OZHYQMVH(846.9)    "maybe once/month" (04/17/2016)  . Hyperlipidemia   . Hypertension   . Migraines    "couple times/yr"  ((04/17/2016)  . Myocardial infarction [facility]) ?2008  . Obesity   . OSA on CPAP    "suppose to wear mask; can't find it" (10/17//2017)  . Osteopenia 12/2010 &01/21/2013   Minimal of hip DEXA   . Pneumonia    "several years" (04/17/2016)  . Retinopathy 12/08   right  . RLS (restless legs syndrome)   . Thyroid disease    "blood work didn't show it but my dr said he can tell by looking at me; I had radiation" (04/17/2016)  . Type II diabetes mellitus (HCC)     Family History  Problem Relation Age of Onset  . Cancer Father  Lung  . Hypertension Mother   . Cancer Mother        Lung  . Diabetes Mother   . Diabetes Maternal Grandmother   . Alzheimer's disease Maternal Grandmother    Social History:   reports that she quit smoking about 30 years ago. Her smoking use included cigarettes. She quit after 25.00 years of use. She has never used smokeless tobacco. She reports that she does not drink alcohol and does not use drugs.  Medications  Current Facility-Administered Medications:  .  sodium chloride flush (NS) 0.9 % injection 3 mL, 3 mL, Intravenous, Once, Sherwood Gambler, MD  Current Outpatient Medications:  .  acetaminophen (TYLENOL) 500 MG tablet, Take 2 tablets (1,000 mg total) by mouth every 6 (six) hours as needed for moderate pain  or headache., Disp: , Rfl:  .  aspirin EC 81 MG tablet, Take 1 tablet (81 mg total) by mouth daily., Disp: , Rfl:  .  atorvastatin (LIPITOR) 80 MG tablet, Take 80 mg by mouth daily., Disp: , Rfl:  .  ezetimibe (ZETIA) 10 MG tablet, Take 1 tablet (10 mg total) by mouth daily., Disp: 90 tablet, Rfl: 3 .  fenofibrate (TRICOR) 48 MG tablet, Take 1 tablet (48 mg total) by mouth daily., Disp: 90 tablet, Rfl: 3 .  furosemide (LASIX) 20 MG tablet, Take 20 mg by mouth daily as needed for fluid. (Patient not taking: Reported on 01/13/2020), Disp: , Rfl:  .  glimepiride (AMARYL) 4 MG tablet, Take 2 mg by mouth 2 (two) times daily.  (Patient not taking: Reported on 01/13/2020), Disp: , Rfl:  .  HYDROcodone-acetaminophen (NORCO/VICODIN) 5-325 MG tablet, Take 1 tablet by mouth every 4 (four) hours as needed for severe pain. (Patient not taking: Reported on 01/13/2020), Disp: 6 tablet, Rfl: 0 .  isosorbide mononitrate (IMDUR) 60 MG 24 hr tablet, Take 1 tablet (60 mg total) by mouth daily., Disp: 90 tablet, Rfl: 3 .  lisinopril (PRINIVIL,ZESTRIL) 5 MG tablet, TAKE 1 TABLET BY MOUTH EVERY DAY, Disp: 30 tablet, Rfl: 7 .  metFORMIN (GLUCOPHAGE) 1000 MG tablet, TAKE 1 TABLET BY MOUTH 2 TIMES DAILY WITH A MEAL., Disp: 60 tablet, Rfl: 2 .  metoprolol tartrate (LOPRESSOR) 25 MG tablet, Take 1 tablet (25 mg total) by mouth 2 (two) times daily., Disp: 180 tablet, Rfl: 3  ROS:  Unable to obtain due to altered mental status.    Exam: Current vital signs: BP (!) 119/56   Pulse 92   Temp 98.2 F (36.8 C) (Oral)   Resp (!) 33   Ht 4' 11.5" (1.511 m)   Wt 87.7 kg   SpO2 94%   BMI 38.40 kg/m  Vital signs in last 24 hours: Temp:  [98.2 F (36.8 C)] 98.2 F (36.8 C) (08/18 1140) Pulse Rate:  [92-95] 92 (08/18 1140) Resp:  [30-33] 33 (08/18 1140) BP: (119)/(56) 119/56 (08/18 1133) SpO2:  [94 %] 94 % (08/18 1140) Weight:  [87.7 kg] 87.7 kg (08/18 1141)   Constitutional: Appears well-developed and well-nourished.   Psych: Confused Eyes: No scleral injection HENT: No OP obstrucion Head: Normocephalic.  Cardiovascular: Normal rate and regular rhythm.  Respiratory: Effort normal, non-labored breathing GI: Soft.  No distension. There is no tenderness.  Skin: WDI  Neuro: Mental Status: Patient is awake, knows her age but does not know the month.  She is able to follow commands.  She does have difficulty repeating and expressing herself at some points.  She shows no dysarthria but positive for  mild aphasia. Cranial Nerves: II: Visual Fields are full.  III,IV, VI: EOMI without ptosis or diploplia. Pupils equal, round and reactive to light V: Facial sensation is symmetric to temperature VII: Facial movement is symmetric.  VIII: hearing is intact to voice X: Palat elevates symmetrically XI: Shoulder shrug is symmetric. XII: tongue is midline without atrophy or fasciculations.  Motor: Antigravity all extremities Drift positive bilateral legs and left arm Sensory: Sensation is symmetric to light touch and temperature in the arms and legs. DSS intact Deep Tendon Reflexes: 2+ and symmetric in the biceps and patellae.  Plantars: Toes are downgoing bilaterally.  Cerebellar: FNF and HKS are intact bilaterally  Labs I have reviewed labs in epic and the results pertinent to this consultation are:   CBC    Component Value Date/Time   WBC 7.7 01/13/2020 1048   WBC 10.7 (H) 07/16/2019 1012   RBC 3.68 (L) 01/13/2020 1048   RBC 3.68 (L) 01/13/2020 1048   HGB 11.9 (L) 02/17/2020 1101   HGB 11.6 (L) 01/13/2020 1048   HGB 13.2 05/29/2017 0939   HCT 35.0 (L) 02/17/2020 1101   HCT 39.0 05/29/2017 0939   PLT 240 01/13/2020 1048   PLT 233 05/29/2017 0939   PLT 220 12/28/2016 1624   MCV 93.5 01/13/2020 1048   MCV 91.1 05/29/2017 0939   MCH 31.5 01/13/2020 1048   MCHC 33.7 01/13/2020 1048   RDW 12.5 01/13/2020 1048   RDW 12.9 05/29/2017 0939   LYMPHSABS 1.8 01/13/2020 1048   LYMPHSABS 2.3  05/29/2017 0939   MONOABS 0.3 01/13/2020 1048   MONOABS 0.3 05/29/2017 0939   EOSABS 0.2 01/13/2020 1048   EOSABS 0.1 05/29/2017 0939   EOSABS 0.2 12/28/2016 1624   BASOSABS 0.0 01/13/2020 1048   BASOSABS 0.0 05/29/2017 0939      Imaging I have reviewed the images obtained:  CT-scan of the brain-no acute findings, remote lacunar infarct in the right thalamus  CTA angio head neck-no emergent finding including large vessel occlusion.  No proximal flow-limiting stenosis or embolic source.  There is at least moderate left P2 segment stenosis.  Etta Quill PA-C [facility] [phone]  M-F  (9:00 am- 5:00 PM)  02/17/2020, 11:49 AM   I have seen the patient and reviewed the above note.  En route, the patient developed significant left-sided hemiparesis as witnessed by EMS and therefore code stroke was activated.  She was already resolving by time of arrival.  Assessment:  This is a 69 year old female who was recently diagnosed with Covid initially to be transferred to [facility] by EMS secondary to confusion and weakness.  In route EMS noted significant left-sided deficits consistent with transient ischemic attack.  She will need to be admitted for work-up.  Impression: -Confusion -Possible stroke  Recommend -MRI of the brain without contrast -Transthoracic Echo -Start patient on ASA 325mg  daily -continue Atorvastatin 80 mg/other high intensity statin -BP goal: permissive HTN upto 220/120 mmHg -HBAIC and Lipid profile -Telemetry monitoring -Frequent neuro checks -NPO until passes stroke swallow screen -PT/OT # please page stroke NP  Or  PA  Or MD from 8am -4 pm  as this patient from this time will be  followed by the stroke.   You can look them up on [email or web address]  Password [email or web address]   Roland Rack, MD [facility] [phone]  If 7pm- 7am, please page neurology on call as listed in [place].

## 2020-02-18 ENCOUNTER — Inpatient Hospital Stay (HOSPITAL_COMMUNITY): Payer: Medicare Other

## 2020-02-18 DIAGNOSIS — G459 Transient cerebral ischemic attack, unspecified: Secondary | ICD-10-CM

## 2020-02-18 LAB — CBG MONITORING, ED
Glucose-Capillary: 227 mg/dL — ABNORMAL HIGH (ref 70–99)
Glucose-Capillary: 291 mg/dL — ABNORMAL HIGH (ref 70–99)

## 2020-02-18 LAB — FERRITIN: Ferritin: 914 ng/mL — ABNORMAL HIGH (ref 11–307)

## 2020-02-18 LAB — ECHOCARDIOGRAM COMPLETE
Area-P 1/2: 2.99 cm2
Height: 59.5 in
S' Lateral: 2.5 cm
Weight: 3093.49 oz

## 2020-02-18 LAB — COMPREHENSIVE METABOLIC PANEL
ALT: 35 U/L (ref 0–44)
AST: 35 U/L (ref 15–41)
Albumin: 2.5 g/dL — ABNORMAL LOW (ref 3.5–5.0)
Alkaline Phosphatase: 64 U/L (ref 38–126)
Anion gap: 11 (ref 5–15)
BUN: 45 mg/dL — ABNORMAL HIGH (ref 8–23)
CO2: 17 mmol/L — ABNORMAL LOW (ref 22–32)
Calcium: 8.1 mg/dL — ABNORMAL LOW (ref 8.9–10.3)
Chloride: 106 mmol/L (ref 98–111)
Creatinine, Ser: 2.36 mg/dL — ABNORMAL HIGH (ref 0.44–1.00)
GFR calc Af Amer: 23 mL/min — ABNORMAL LOW (ref >60)
GFR calc non Af Amer: 20 mL/min — ABNORMAL LOW (ref >60)
Glucose, Bld: 244 mg/dL — ABNORMAL HIGH (ref 70–99)
Potassium: 3.9 mmol/L (ref 3.5–5.1)
Sodium: 134 mmol/L — ABNORMAL LOW (ref 135–145)
Total Bilirubin: 0.7 mg/dL (ref 0.3–1.2)
Total Protein: 5.3 g/dL — ABNORMAL LOW (ref 6.5–8.1)

## 2020-02-18 LAB — CBC WITH DIFFERENTIAL/PLATELET
Abs Immature Granulocytes: 0 10*3/uL (ref 0.00–0.07)
Basophils Absolute: 0 10*3/uL (ref 0.0–0.1)
Basophils Relative: 0 %
Eosinophils Absolute: 0 10*3/uL (ref 0.0–0.5)
Eosinophils Relative: 0 %
HCT: 33.5 % — ABNORMAL LOW (ref 36.0–46.0)
Hemoglobin: 10.8 g/dL — ABNORMAL LOW (ref 12.0–15.0)
Lymphocytes Relative: 6 %
Lymphs Abs: 0.2 10*3/uL — ABNORMAL LOW (ref 0.7–4.0)
MCH: 30.6 pg (ref 26.0–34.0)
MCHC: 32.2 g/dL (ref 30.0–36.0)
MCV: 94.9 fL (ref 80.0–100.0)
Monocytes Absolute: 0.1 10*3/uL (ref 0.1–1.0)
Monocytes Relative: 2 %
Neutro Abs: 3.2 10*3/uL (ref 1.7–7.7)
Neutrophils Relative %: 92 %
Platelets: 132 10*3/uL — ABNORMAL LOW (ref 150–400)
RBC: 3.53 MIL/uL — ABNORMAL LOW (ref 3.87–5.11)
RDW: 13 % (ref 11.5–15.5)
WBC: 3.5 10*3/uL — ABNORMAL LOW (ref 4.0–10.5)
nRBC: 0 % (ref 0.0–0.2)
nRBC: 0 /100 WBC

## 2020-02-18 LAB — D-DIMER, QUANTITATIVE: D-Dimer, Quant: 1.16 ug/mL-FEU — ABNORMAL HIGH (ref 0.00–0.50)

## 2020-02-18 LAB — MAGNESIUM: Magnesium: 1.4 mg/dL — ABNORMAL LOW (ref 1.7–2.4)

## 2020-02-18 LAB — PHOSPHORUS: Phosphorus: 4.5 mg/dL (ref 2.5–4.6)

## 2020-02-18 LAB — LIPID PANEL
Cholesterol: 78 mg/dL (ref 0–200)
HDL: 29 mg/dL — ABNORMAL LOW (ref >40)
LDL Cholesterol: 29 mg/dL (ref 0–99)
Total CHOL/HDL Ratio: 2.7 RATIO
Triglycerides: 102 mg/dL (ref <150)
VLDL: 20 mg/dL (ref 0–40)

## 2020-02-18 LAB — GLUCOSE, CAPILLARY: Glucose-Capillary: 360 mg/dL — ABNORMAL HIGH (ref 70–99)

## 2020-02-18 LAB — HEMOGLOBIN A1C
Hgb A1c MFr Bld: 8.5 % — ABNORMAL HIGH (ref 4.8–5.6)
Mean Plasma Glucose: 197.25 mg/dL

## 2020-02-18 LAB — C-REACTIVE PROTEIN: CRP: 12.1 mg/dL — ABNORMAL HIGH (ref <1.0)

## 2020-02-18 MED ORDER — MAGNESIUM SULFATE 2 GM/50ML IV SOLN
2.0000 g | Freq: Once | INTRAVENOUS | Status: AC
  Administered 2020-02-18: 2 g via INTRAVENOUS
  Filled 2020-02-18: qty 50

## 2020-02-18 MED ORDER — INSULIN DETEMIR 100 UNIT/ML ~~LOC~~ SOLN
8.0000 [IU] | Freq: Two times a day (BID) | SUBCUTANEOUS | Status: DC
  Administered 2020-02-19: 8 [IU] via SUBCUTANEOUS
  Filled 2020-02-18 (×4): qty 0.08

## 2020-02-18 MED ORDER — SODIUM CHLORIDE 0.9 % IV SOLN: INTRAVENOUS | Status: AC

## 2020-02-18 NOTE — ED Notes (Signed)
Jarah Pember daughter [phone] would like an update on pt

## 2020-02-18 NOTE — ED Notes (Addendum)
CBG 227 

## 2020-02-18 NOTE — Progress Notes (Signed)
STROKE TEAM PROGRESS NOTE   INTERVAL HISTORY Patient has a recent Covid infection and is still on isolation precautions.  I have personally reviewed history of present illness with the patient, electronic medical records and imaging films in PACS.  She presented with some confusion and altered mental status and EMS noted some focal left-sided weakness but the patient herself denies any focal symptoms at the present time and confusion appears to have improved.  MRI scan of the brain is negative for acute ischemia and only shows mild changes of chronic small vessel disease.  CT angiogram of the brain and neck both show no significant large vessel stenosis or occlusion.  2D echo is done but results are pending.  LDL cholesterol is 29 mg percent and hemoglobin A1c is elevated at 8.5.  Vitals:   02/18/20 0000 02/18/20 0300 02/18/20 0730 02/18/20 0800  BP: (!) 117/56 122/63 (!) 104/49 133/72  Pulse: 77 78 78 87  Resp: 20 (!) 21 (!) 23 20  Temp: 98 F (36.7 C) 97.6 F (36.4 C)    TempSrc: Oral Oral    SpO2: 100% 98% 95% 98%  Weight:      Height:       CBC:  Recent Labs  Lab 02/17/20 1100 02/17/20 1100 02/17/20 1101 02/18/20 1037  WBC 6.4  --   --  3.5*  NEUTROABS 5.5  --   --  3.2  HGB 11.9*   < > 11.9* 10.8*  HCT 37.0   < > 35.0* 33.5*  MCV 96.1  --   --  94.9  PLT 137*  --   --  132*   < > = values in this interval not displayed.   Basic Metabolic Panel:  Recent Labs  Lab 02/17/20 1100 02/17/20 1100 02/17/20 1101 02/18/20 1037  NA 136   < > 136 134*  K 3.8   < > 4.0 3.9  CL 104   < > 103 106  CO2 18*  --   --  17*  GLUCOSE 195*   < > 187* 244*  BUN 35*   < > 46* 45*  CREATININE 2.42*   < > 2.50* 2.36*  CALCIUM 8.5*  --   --  8.1*  MG  --   --   --  1.4*  PHOS  --   --   --  4.5   < > = values in this interval not displayed.   Lipid Panel:  Recent Labs  Lab 02/18/20 1037  CHOL 78  TRIG 102  HDL 29*  CHOLHDL 2.7  VLDL 20  LDLCALC 29   HgbA1c:  Recent Labs   Lab 02/17/20 1100  HGBA1C 8.4*   Urine Drug Screen: No results for input(s): LABOPIA, COCAINSCRNUR, LABBENZ, AMPHETMU, THCU, LABBARB in the last 168 hours.  Alcohol Level No results for input(s): ETH in the last 168 hours.  IMAGING past 24 hours MR BRAIN WO CONTRAST  Result Date: 02/18/2020 CLINICAL DATA:  Left-sided weakness, code stroke follow-up EXAM: MRI HEAD WITHOUT CONTRAST TECHNIQUE: Multiplanar, multiecho pulse sequences of the brain and surrounding structures were obtained without intravenous contrast. COMPARISON:  None. FINDINGS: Brain: There is no acute infarction or intracranial hemorrhage. There is no intracranial mass, mass effect, or edema. There is no hydrocephalus or extra-axial fluid collection. Prominence of the ventricles and sulci reflects generalized parenchymal volume loss. Patchy foci of T2 hyperintensity in the supratentorial white matter are nonspecific but may reflect mild to moderate chronic microvascular ischemic changes. There  is a chronic small vessel infarct of the right thalamus. Vascular: Major vessel flow voids at the skull base are preserved. Skull and upper cervical spine: Normal marrow signal is preserved. Sinuses/Orbits: Paranasal sinuses mucosal thickening. Orbits are unremarkable. Other: Sella is partially empty.  Mastoid air cells are clear. IMPRESSION: No evidence of recent infarction, hemorrhage, or mass. Chronic microvascular ischemic changes. Small chronic right thalamic infarct. Electronically Signed   By: Macy Mis M.D.   On: 02/18/2020 09:12   US RENAL  Result Date: 02/17/2020 CLINICAL DATA:  Acute renal insufficiency. EXAM: RENAL / URINARY TRACT ULTRASOUND COMPLETE COMPARISON:  None. FINDINGS: Right Kidney: Renal measurements: 11.1 cm x 4.9 cm x 5.0 cm = volume: 143.2 mL. There is diffuse renal cortical thinning. Echogenicity within normal limits. No mass or hydronephrosis visualized. Left Kidney: Renal measurements: 9.0 cm x 4.8 cm x 5.2 cm =  volume: 117.5 mL. There is diffuse renal cortical thinning. Echogenicity within normal limits. No mass or hydronephrosis visualized. Bladder: Appears normal for degree of bladder distention. Other: Of incidental note is the presence of diffusely increased echogenicity of the liver parenchyma. IMPRESSION: 1. Diffuse bilateral renal cortical thinning. 2. Fatty liver. Electronically Signed   By: Virgina Norfolk M.D.   On: 02/17/2020 16:41    PHYSICAL EXAM *Pleasant elderly Caucasian lady in mild respiratory distress.  On nasal cannula oxygen. . Afebrile. Head is nontraumatic. Neck is supple without bruit.    Cardiac exam no murmur or gallop. Lungs are clear to auscultation. Distal pulses are well felt. Neurological Exam ;  Awake  Alert oriented x 3. Normal speech and language.eye movements full without nystagmus.fundi were not visualized. Vision acuity and fields appear normal. Hearing is normal. Palatal movements are normal. Face symmetric. Tongue midline. Normal strength, tone, reflexes and coordination but poor effort and testing bilateral lower extremities with drift.. Normal sensation. Gait deferred. ASSESSMENT/PLAN [name] is a 69 y.o. female with history of diabetes, myocardial infarct, migraines, hypertension, hyperlipidemia, CKD, CAD who was dx w/ COVID 4 days prior to arrival presenting with speech difficulties and L sided weakness.   Possible right hemispheric TIA due to small vessel disease in setting of recent Covid infection.  Code Stroke CT head No acute abnormality. Old R thalamic lacune. ASPECTS 10.     CTA head & neck no LVO. L P2 stenosis. Patchy B PNA. Nodular thyroid.   MRI  No acute infarct, hemorrhage or mass. Small vessel disease. Old R thalamic infarct   2D Echo pending   LDL 78  HgbA1c 8.4  VTE prophylaxis - Heparin 5000 units sq tid   aspirin 81 mg daily prescribed prior to admission but pt states not taking, now on aspirin 81 mg daily. Continue at  d/c.  Therapy recommendations:  pending   Disposition:  pending   COVID - 19 Infection w/ impending Respiratory Failure  Dx 4d prior to admission   On remdisivir and steroid  DDimer pending   Hypertension  Stable . BP goal normotensive  Hyperlipidemia  Home meds:  lipitor 80, zetia 10, fenofibrate 48, resumed in hospital  LDL 78  Continue statin at discharge  Diabetes type II Uncontrolled  HgbA1c 8.4, goal < 7.0  Other Stroke Risk Factors  Advanced age  Former Cigarette smoker, quit 30 yrs ago  Obesity, Body mass index is 38.4 kg/m., recommend weight loss, diet and exercise as appropriate   Coronary artery disease s/p CABG, DES  Migraines  Obstructive sleep apnea, on CPAP at  home  Other Active Problems  Thyroid nodules, incidental finding. For OP [place]  Hospital day # 1  Patient also denies any focal symptoms but apparently EMS noticed some left sided weakness which was transient.  Patient mental status as well as focal symptoms seem to resolve.  Unclear if this is a true TIA and not in setting of Covid infection but etiology likely small vessel disease.  Recommend aspirin, check echocardiogram, aggressive risk factor modification.  Greater than 50% time during this 35-minute visit was spent on counseling and coordination of care about TIA and Covid infection and discussion about stroke prevention and answering questions.  Discussed with Dr.Ghimire Antony Contras, MD  To contact Stroke Continuity provider, please refer to [email or web address] After hours, contact General Neurology

## 2020-02-18 NOTE — Progress Notes (Signed)
  Echocardiogram 2D Echocardiogram has been performed.  [name] 02/18/2020, 1:46 PM

## 2020-02-18 NOTE — Progress Notes (Signed)
PROGRESS NOTE    [name]  FTD:[id number] DOB: [birth year] DOA: 02/17/2020 PCP: Carol Ada, MD    Brief Narrative:  69 year old female with history of chronic iron deficiency anemia, hypothyroidism, coronary artery disease status post stents x6, hypertension, diabetes on oral hypoglycemics, hyperlipidemia presented to the emergency room with cough and shortness of breath, generalized weakness with recently diagnosed Covid 19 infection as well as weakness of the body left more than right.  All her family was exposed the infection.  They went to [facility] 4 days ago to have COVID-19 test done and was positive.  She stayed home, however could not eat or drink anything.  Felt very weak.  Yesterday, her daughter noted that her left side was weak so EMS was called. At the emergency room, code stroke was triggered.  CT head, CTA of the head and neck showed no critical narrow blood vessels.  Chest x-ray showed bilateral multifocal infiltrates compatible with COVID-19 infection.  Creatinine was 2.4, elevated from baseline.  She was needing 1 or 2 L of oxygen. Admitted with acute stroke versus TIA, COVID-19 pneumonia.  Assessment & Plan:   Active Problems:   COVID-19 virus infection   TIA (transient ischemic attack)  Suspected right hemispheric stroke: Ischemic stroke. Clinical findings, generalized weakness.  Left upper extremity weakness more than the right. CT head findings, normal. MRI of the brain, no evidence of acute stroke.  Chronic microvascular changes. CT of the head and neck, no major blockage. 2D echocardiogram, pending. Antiplatelet therapy, on aspirin at home, but not taking.  Started on baby aspirin. LDL 78.  On multiple regimen that we will continue. Hemoglobin A1c more than 8. DVT prophylaxis, heparin. Therapy recommendations, pending. [facility].  Continue to work with PT OT.  Pneumonia due to COVID-19 virus: chest physiotherapy, incentive spirometry, deep  breathing exercises, sputum induction, mucolytic's and bronchodilators. Supplemental oxygen to keep saturations more than 90%. Covid directed therapy with , steroids Solu-Medrol, 0.5 mg/kg/day remdesivir, day 2/5 antibiotics, not indicated Due to severity of symptoms, patient will need daily inflammatory markers, chest x-rays, liver function test to monitor and direct COVID-19 therapies.  Acute renal failure: Due to poor oral intake.  Continue maintenance IV fluids.  Monitor closely. Renal ultrasound was normal.  Type 2 diabetes: Poorly controlled.  On Metformin at home.  Currently on sliding scale insulin.  Expected to hyperglycemia with use of steroids for COVID-19 pneumonia.  We will add long-acting insulin.  Patient will need adjustment of her therapy on discharge.  Hypertension: Blood pressures stable.  Holding lisinopril and other antihypertensives until clinical improvement.    DVT prophylaxis: heparin injection 5,000 Units Start: 02/17/20 1315   Code Status: Full code Family Communication: Daughter on the phone Disposition Plan: Status is: Inpatient  Remains inpatient appropriate because:IV treatments appropriate due to intensity of illness or inability to take PO and Inpatient level of care appropriate due to severity of illness   Dispo: The patient is from: Home              Anticipated d/c is to: Home              Anticipated d/c date is: 3 days              Patient currently is not medically stable to d/c.         Consultants:   Neurology  Procedures:   None  Antimicrobials:  Antibiotics Given (last 72 hours)    Date/Time Action Medication Dose  Rate   02/17/20 1442 New Bag/Given   remdesivir 200 mg in sodium chloride 0.9% 250 mL IVPB 200 mg 580 mL/hr   02/18/20 1030 New Bag/Given   remdesivir 100 mg in sodium chloride 0.9 % 100 mL IVPB 100 mg 200 mL/hr         Subjective: Patient seen and examined.  Came back from MRI.  Patient herself does not  appreciate any weakness on either side of the body.  She was able to eat her breakfast.  Has minimal shortness of breath.  Currently without nausea vomiting.  She was so worried that she did not eat anything for last 4 days.  Remains afebrile.  On minimal oxygen. Patient does not believe on COVID-19 vaccination.  She believes and all other medical care including influenza and pneumococcal vaccine because they are FDA authorized. She is very appreciative of care provided to her.  Objective: Vitals:   02/18/20 0000 02/18/20 0300 02/18/20 0730 02/18/20 0800  BP: (!) 117/56 122/63 (!) 104/49 133/72  Pulse: 77 78 78 87  Resp: 20 (!) 21 (!) 23 20  Temp: 98 F (36.7 C) 97.6 F (36.4 C)    TempSrc: Oral Oral    SpO2: 100% 98% 95% 98%  Weight:      Height:       No intake or output data in the 24 hours ending 02/18/20 1520 Filed Weights   02/17/20 1100 02/17/20 1141  Weight: 87.7 kg 87.7 kg    Examination:  General exam: Appears calm and comfortable  Not in any distress.  On 1 L oxygen. Respiratory system: Clear to auscultation. Respiratory effort normal.  No added sounds. Cardiovascular system: S1 & S2 heard, RRR. No JVD, murmurs, rubs, gallops or clicks. No pedal edema. Gastrointestinal system: Abdomen is nondistended, soft and nontender. No organomegaly or masses felt. Normal bowel sounds heard.  Obese and pendulous. Central nervous system: Alert and oriented.  No cranial nerve deficits. She has equal motor power on all 4 extremities, however left upper extremity and both lower extremities are weaker than the right upper extremity.    Data Reviewed: I have personally reviewed following labs and imaging studies  CBC: Recent Labs  Lab 02/17/20 1100 02/17/20 1101 02/18/20 1037  WBC 6.4  --  3.5*  NEUTROABS 5.5  --  3.2  HGB 11.9* 11.9* 10.8*  HCT 37.0 35.0* 33.5*  MCV 96.1  --  94.9  PLT 137*  --  254*   Basic Metabolic Panel: Recent Labs  Lab 02/17/20 1100  02/17/20 1101 02/18/20 1037  NA 136 136 134*  K 3.8 4.0 3.9  CL 104 103 106  CO2 18*  --  17*  GLUCOSE 195* 187* 244*  BUN 35* 46* 45*  CREATININE 2.42* 2.50* 2.36*  CALCIUM 8.5*  --  8.1*  MG  --   --  1.4*  PHOS  --   --  4.5   GFR: Estimated Creatinine Clearance: 21.6 mL/min (A) (by C-G formula based on SCr of 2.36 mg/dL (H)). Liver Function Tests: Recent Labs  Lab 02/17/20 1100 02/18/20 1037  AST 44* 35  ALT 41 35  ALKPHOS 77 64  BILITOT 0.6 0.7  PROT 5.6* 5.3*  ALBUMIN 2.9* 2.5*   No results for input(s): LIPASE, AMYLASE in the last 168 hours. No results for input(s): AMMONIA in the last 168 hours. Coagulation Profile: Recent Labs  Lab 02/17/20 1100  INR 1.0   Cardiac Enzymes: No results for input(s): CKTOTAL, CKMB, CKMBINDEX,  TROPONINI in the last 168 hours. BNP (last 3 results) No results for input(s): PROBNP in the last 8760 hours. HbA1C: Recent Labs    02/17/20 1100 02/18/20 1037  HGBA1C 8.4* 8.5*   CBG: Recent Labs  Lab 02/17/20 1101 02/17/20 1656 02/17/20 2212 02/18/20 0756 02/18/20 1141  GLUCAP 180* 172* 237* 227* 291*   Lipid Profile: Recent Labs    02/17/20 1220 02/18/20 1037  CHOL  --  78  HDL  --  29*  LDLCALC  --  29  TRIG 203* 102  CHOLHDL  --  2.7   Thyroid Function Tests: Recent Labs    02/17/20 1220  TSH 0.379   Anemia Panel: Recent Labs    02/17/20 1220 02/18/20 1037  FERRITIN 769* 914*   Sepsis Labs: Recent Labs  Lab 02/17/20 1220 02/17/20 1657  PROCALCITON 0.14  --   LATICACIDVEN 2.9* 2.2*    Recent Results (from the past 240 hour(s))  Blood Culture (routine x 2)     Status: None (Preliminary result)   Collection Time: 02/17/20 12:20 PM   Specimen: BLOOD  Result Value Ref Range Status   Specimen Description BLOOD RIGHT ANTECUBITAL  Final   Special Requests   Final    BOTTLES DRAWN AEROBIC ONLY Blood Culture adequate volume   Culture   Final    NO GROWTH < 24 HOURS Performed at [facility], [place] [address], [place], [place] [address]    Report Status PENDING  Incomplete  Blood Culture (routine x 2)     Status: None (Preliminary result)   Collection Time: 02/17/20 12:31 PM   Specimen: BLOOD  Result Value Ref Range Status   Specimen Description BLOOD RIGHT ANTECUBITAL  Final   Special Requests   Final    BOTTLES DRAWN AEROBIC AND ANAEROBIC Blood Culture adequate volume   Culture   Final    NO GROWTH < 24 HOURS Performed at [facility], [place] [address], [place], [place] [address]    Report Status PENDING  Incomplete         Radiology Studies: MR BRAIN WO CONTRAST  Result Date: 02/18/2020 CLINICAL DATA:  Left-sided weakness, code stroke follow-up EXAM: MRI HEAD WITHOUT CONTRAST TECHNIQUE: Multiplanar, multiecho pulse sequences of the brain and surrounding structures were obtained without intravenous contrast. COMPARISON:  None. FINDINGS: Brain: There is no acute infarction or intracranial hemorrhage. There is no intracranial mass, mass effect, or edema. There is no hydrocephalus or extra-axial fluid collection. Prominence of the ventricles and sulci reflects generalized parenchymal volume loss. Patchy foci of T2 hyperintensity in the supratentorial white matter are nonspecific but may reflect mild to moderate chronic microvascular ischemic changes. There is a chronic small vessel infarct of the right thalamus. Vascular: Major vessel flow voids at the skull base are preserved. Skull and upper cervical spine: Normal marrow signal is preserved. Sinuses/Orbits: Paranasal sinuses mucosal thickening. Orbits are unremarkable. Other: Sella is partially empty.  Mastoid air cells are clear. IMPRESSION: No evidence of recent infarction, hemorrhage, or mass. Chronic microvascular ischemic changes. Small chronic right thalamic infarct. Electronically Signed   By: Macy Mis M.D.   On: 02/18/2020 09:12   US RENAL  Result Date: 02/17/2020 CLINICAL DATA:  Acute renal  insufficiency. EXAM: RENAL / URINARY TRACT ULTRASOUND COMPLETE COMPARISON:  None. FINDINGS: Right Kidney: Renal measurements: 11.1 cm x 4.9 cm x 5.0 cm = volume: 143.2 mL. There is diffuse renal cortical thinning. Echogenicity within normal limits. No mass or hydronephrosis  visualized. Left Kidney: Renal measurements: 9.0 cm x 4.8 cm x 5.2 cm = volume: 117.5 mL. There is diffuse renal cortical thinning. Echogenicity within normal limits. No mass or hydronephrosis visualized. Bladder: Appears normal for degree of bladder distention. Other: Of incidental note is the presence of diffusely increased echogenicity of the liver parenchyma. IMPRESSION: 1. Diffuse bilateral renal cortical thinning. 2. Fatty liver. Electronically Signed   By: Virgina Norfolk M.D.   On: 02/17/2020 16:41   DG Chest Portable 1 View  Result Date: 02/17/2020 CLINICAL DATA:  COVID-19 positive.  Altered mental status. EXAM: PORTABLE CHEST 1 VIEW COMPARISON:  11/06/2016 FINDINGS: The cardiomediastinal silhouette is within normal limits status post CABG. There are interstitial densities in both lungs as well as asymmetric patchy opacity in the left lung base. No sizable pleural effusion or pneumothorax is identified. No acute osseous abnormality is seen. IMPRESSION: Left greater than right lung opacities compatible with pneumonia. Electronically Signed   By: Logan Bores M.D.   On: 02/17/2020 11:56   CT HEAD CODE STROKE WO CONTRAST  Result Date: 02/17/2020 CLINICAL DATA:  Code stroke.  Acute neuro deficit. EXAM: CT HEAD WITHOUT CONTRAST TECHNIQUE: Contiguous axial images were obtained from the base of the skull through the vertex without intravenous contrast. COMPARISON:  08/13/2015 FINDINGS: Brain: No evidence of acute infarction, hemorrhage, hydrocephalus, extra-axial collection or mass lesion/mass effect. Well-defined and low-density lacune at the right thalamus on coronal reformats, interval. Vascular: Atherosclerotic calcifications.  No  hyperdense vessel. Skull: Normal. Negative for fracture or focal lesion. Sinuses/Orbits: No acute finding. Other: These results were communicated to Dr. Leonel Ramsay at [place] 8/18/2021by text page via the [facility] messaging system. ASPECTS [facility] Stroke Program Early CT Score) Not scored with this limited history. IMPRESSION: 1. No acute finding. 2. Remote lacunar infarct at the right thalamus. Electronically Signed   By: Monte Fantasia M.D.   On: 02/17/2020 11:12   CT ANGIO HEAD CODE STROKE  Result Date: 02/17/2020 CLINICAL DATA:  Transient left-sided weakness. EXAM: CT ANGIOGRAPHY HEAD AND NECK TECHNIQUE: Multidetector CT imaging of the head and neck was performed using the standard protocol during bolus administration of intravenous contrast. Multiplanar CT image reconstructions and MIPs were obtained to evaluate the vascular anatomy. Carotid stenosis measurements (when applicable) are obtained utilizing NASCET criteria, using the distal internal carotid diameter as the denominator. CONTRAST:  Dose is currently not known. COMPARISON:  Head CT from earlier today FINDINGS: CTA NECK FINDINGS Aortic arch: Atheromatous plaque.  CABG. Right carotid system: Calcified plaque at the bifurcation and proximal ICA without flow limiting stenosis or ulceration. Left carotid system: Mild calcified plaque at the bifurcation. No ulceration or stenosis. Vertebral arteries: No proximal subclavian stenosis. Relatively codominant vertebral arteries that are smooth and widely patent to the dura. Skeleton: Unremarkable Other neck: Nodular thyroid not well evaluated on this arterial study but there is asymmetric thickening of the right lobe to 3 cm. Nodular thyroid with noted on a 2010 ultrasound. Direct comparison is not possible. Upper chest: Patchy airspace disease in the upper lungs. There is history of COVID-19. Review of the MIP images confirms the above findings CTA HEAD FINDINGS Anterior circulation: Atheromatous plaque  on the carotid siphons. No branch occlusion, beading, or aneurysm. Posterior circulation: Codominant vertebral arteries. The vertebral and basilar arteries are smooth and widely patent. Atheromatous irregularity of bilateral PCA with moderate to advanced narrowings at the left P2 segment. Venous sinuses: Patent as permitted by contrast timing. Anatomic variants: None significant Review of the  MIP images confirms the above findings IMPRESSION: 1. No emergent finding including large vessel occlusion. 2. No proximal flow limiting stenosis or embolic source. 3. These at least moderate left P2 segment stenosis. 4. Patchy bilateral pneumonia. 5. Nodular thyroid. Recommend thyroid US if appropriate for comorbidities. (Ref: J Am Coll Radiol. 2015 Feb;12(2): 143-50). Electronically Signed   By: Monte Fantasia M.D.   On: 02/17/2020 11:30   CT ANGIO NECK CODE STROKE  Result Date: 02/17/2020 CLINICAL DATA:  Transient left-sided weakness. EXAM: CT ANGIOGRAPHY HEAD AND NECK TECHNIQUE: Multidetector CT imaging of the head and neck was performed using the standard protocol during bolus administration of intravenous contrast. Multiplanar CT image reconstructions and MIPs were obtained to evaluate the vascular anatomy. Carotid stenosis measurements (when applicable) are obtained utilizing NASCET criteria, using the distal internal carotid diameter as the denominator. CONTRAST:  Dose is currently not known. COMPARISON:  Head CT from earlier today FINDINGS: CTA NECK FINDINGS Aortic arch: Atheromatous plaque.  CABG. Right carotid system: Calcified plaque at the bifurcation and proximal ICA without flow limiting stenosis or ulceration. Left carotid system: Mild calcified plaque at the bifurcation. No ulceration or stenosis. Vertebral arteries: No proximal subclavian stenosis. Relatively codominant vertebral arteries that are smooth and widely patent to the dura. Skeleton: Unremarkable Other neck: Nodular thyroid not well evaluated  on this arterial study but there is asymmetric thickening of the right lobe to 3 cm. Nodular thyroid with noted on a 2010 ultrasound. Direct comparison is not possible. Upper chest: Patchy airspace disease in the upper lungs. There is history of COVID-19. Review of the MIP images confirms the above findings CTA HEAD FINDINGS Anterior circulation: Atheromatous plaque on the carotid siphons. No branch occlusion, beading, or aneurysm. Posterior circulation: Codominant vertebral arteries. The vertebral and basilar arteries are smooth and widely patent. Atheromatous irregularity of bilateral PCA with moderate to advanced narrowings at the left P2 segment. Venous sinuses: Patent as permitted by contrast timing. Anatomic variants: None significant Review of the MIP images confirms the above findings IMPRESSION: 1. No emergent finding including large vessel occlusion. 2. No proximal flow limiting stenosis or embolic source. 3. These at least moderate left P2 segment stenosis. 4. Patchy bilateral pneumonia. 5. Nodular thyroid. Recommend thyroid US if appropriate for comorbidities. (Ref: J Am Coll Radiol. 2015 Feb;12(2): 143-50). Electronically Signed   By: Monte Fantasia M.D.   On: 02/17/2020 11:30        Scheduled Meds: . aspirin EC  81 mg Oral Daily  . atorvastatin  80 mg Oral Daily  . ezetimibe  10 mg Oral Daily  . fenofibrate  54 mg Oral Daily  . heparin  5,000 Units Subcutaneous Q12H  . insulin aspart  0-5 Units Subcutaneous QHS  . insulin aspart  0-9 Units Subcutaneous TID WC  . insulin detemir  8 Units Subcutaneous BID  . Ipratropium-Albuterol  1 puff Inhalation Q6H  . methylPREDNISolone (SOLU-MEDROL) injection  0.5 mg/kg Intravenous Q12H  . metoprolol tartrate  25 mg Oral BID  . sodium chloride flush  3 mL Intravenous Once   Continuous Infusions: . remdesivir 100 mg in NS 100 mL Stopped (02/18/20 1104)     LOS: 1 day    Time spent: 40 minutes    Barb Merino, MD [facility] Pager [phone]

## 2020-02-18 NOTE — ED Notes (Signed)
Daughter updated on plan of care. All questions answered

## 2020-02-18 NOTE — ED Notes (Signed)
Pt was soiled in urine and stool, shivering.  Changed pt into new depend, changed her into hospital gown, washed her with warm soapy water and peri spray.  Placed barrier cream as pt states she has been having some diarrhea.  Placed new purewick and moved pt on to hospital bed for comfort.  Pt is alert and oriented, no neuro deficits noted at this time.

## 2020-02-18 NOTE — ED Notes (Signed)
Report given to 3W RN. All questions answered.  

## 2020-02-18 NOTE — ED Notes (Signed)
Pt transported to MRI on 2 LPM [place] by transporter at this time.

## 2020-02-18 NOTE — ED Notes (Signed)
Ordered Bre[place]ast °

## 2020-02-18 NOTE — ED Notes (Signed)
[id number] 

## 2020-02-18 NOTE — Progress Notes (Signed)
Inpatient Diabetes Program Recommendations  AACE/ADA: New Consensus Statement on Inpatient Glycemic Control (2015)  Target Ranges:  Prepandial:   less than 140 mg/dL      Peak postprandial:   less than 180 mg/dL (1-2 hours)      Critically ill patients:  140 - 180 mg/dL   Lab Results  Component Value Date   GLUCAP 291 (H) 02/18/2020   HGBA1C 8.5 (H) 02/18/2020    Review of Glycemic Control Results for QUENTINA, FRONEK (MRN [id number]) as of 02/18/2020 14:28  Ref. Range 02/17/2020 22:12 02/18/2020 07:56 02/18/2020 11:41  Glucose-Capillary Latest Ref Range: 70 - 99 mg/dL 237 (H) 227 (H) 291 (H)   Diabetes history: Type 2 DM Outpatient Diabetes medications: Amaryl 2 mg BID, Metformin 1000 mg BID Current orders for Inpatient glycemic control: Novolog 0-9 units TID, Novolog 0-5 units QHS,  Solumedrol 43.75 mg BID  Inpatient Diabetes Program Recommendations:    Consider also adding Levemir 8 units BID and Tradjenta 5 mg QD.   Thanks, Bronson Curb, MSN, RNC-OB Diabetes Coordinator [phone] (8a-5p)

## 2020-02-19 LAB — COMPREHENSIVE METABOLIC PANEL
ALT: 34 U/L (ref 0–44)
AST: 36 U/L (ref 15–41)
Albumin: 2.4 g/dL — ABNORMAL LOW (ref 3.5–5.0)
Alkaline Phosphatase: 70 U/L (ref 38–126)
Anion gap: 11 (ref 5–15)
BUN: 47 mg/dL — ABNORMAL HIGH (ref 8–23)
CO2: 16 mmol/L — ABNORMAL LOW (ref 22–32)
Calcium: 8.2 mg/dL — ABNORMAL LOW (ref 8.9–10.3)
Chloride: 106 mmol/L (ref 98–111)
Creatinine, Ser: 2.06 mg/dL — ABNORMAL HIGH (ref 0.44–1.00)
GFR calc Af Amer: 28 mL/min — ABNORMAL LOW (ref >60)
GFR calc non Af Amer: 24 mL/min — ABNORMAL LOW (ref >60)
Glucose, Bld: 377 mg/dL — ABNORMAL HIGH (ref 70–99)
Potassium: 3.5 mmol/L (ref 3.5–5.1)
Sodium: 133 mmol/L — ABNORMAL LOW (ref 135–145)
Total Bilirubin: 0.3 mg/dL (ref 0.3–1.2)
Total Protein: 5.3 g/dL — ABNORMAL LOW (ref 6.5–8.1)

## 2020-02-19 LAB — C-REACTIVE PROTEIN: CRP: 7.9 mg/dL — ABNORMAL HIGH (ref <1.0)

## 2020-02-19 LAB — GLUCOSE, CAPILLARY
Glucose-Capillary: 284 mg/dL — ABNORMAL HIGH (ref 70–99)
Glucose-Capillary: 300 mg/dL — ABNORMAL HIGH (ref 70–99)
Glucose-Capillary: 299 mg/dL — ABNORMAL HIGH (ref 70–99)
Glucose-Capillary: 214 mg/dL — ABNORMAL HIGH (ref 70–99)
Glucose-Capillary: 202 mg/dL — ABNORMAL HIGH (ref 70–99)

## 2020-02-19 LAB — FERRITIN: Ferritin: 789 ng/mL — ABNORMAL HIGH (ref 11–307)

## 2020-02-19 LAB — CBC WITH DIFFERENTIAL/PLATELET
Abs Immature Granulocytes: 0.03 10*3/uL (ref 0.00–0.07)
Basophils Absolute: 0 10*3/uL (ref 0.0–0.1)
Basophils Relative: 0 %
Eosinophils Absolute: 0 10*3/uL (ref 0.0–0.5)
Eosinophils Relative: 0 %
HCT: 33.5 % — ABNORMAL LOW (ref 36.0–46.0)
Hemoglobin: 11.2 g/dL — ABNORMAL LOW (ref 12.0–15.0)
Immature Granulocytes: 1 %
Lymphocytes Relative: 12 %
Lymphs Abs: 0.6 10*3/uL — ABNORMAL LOW (ref 0.7–4.0)
MCH: 30.9 pg (ref 26.0–34.0)
MCHC: 33.4 g/dL (ref 30.0–36.0)
MCV: 92.5 fL (ref 80.0–100.0)
Monocytes Absolute: 0.2 10*3/uL (ref 0.1–1.0)
Monocytes Relative: 4 %
Neutro Abs: 4.1 10*3/uL (ref 1.7–7.7)
Neutrophils Relative %: 83 %
Platelets: 153 10*3/uL (ref 150–400)
RBC: 3.62 MIL/uL — ABNORMAL LOW (ref 3.87–5.11)
RDW: 12.7 % (ref 11.5–15.5)
WBC: 5 10*3/uL (ref 4.0–10.5)
nRBC: 0 % (ref 0.0–0.2)

## 2020-02-19 LAB — PHOSPHORUS: Phosphorus: 3.5 mg/dL (ref 2.5–4.6)

## 2020-02-19 LAB — MAGNESIUM: Magnesium: 2.1 mg/dL (ref 1.7–2.4)

## 2020-02-19 LAB — D-DIMER, QUANTITATIVE: D-Dimer, Quant: 0.97 ug/mL-FEU — ABNORMAL HIGH (ref 0.00–0.50)

## 2020-02-19 MED ORDER — INSULIN DETEMIR 100 UNIT/ML ~~LOC~~ SOLN
16.0000 [IU] | Freq: Two times a day (BID) | SUBCUTANEOUS | Status: DC
  Administered 2020-02-19 – 2020-02-21 (×5): 16 [IU] via SUBCUTANEOUS
  Filled 2020-02-19 (×6): qty 0.16

## 2020-02-19 MED ORDER — INSULIN ASPART 100 UNIT/ML ~~LOC~~ SOLN
5.0000 [IU] | Freq: Three times a day (TID) | SUBCUTANEOUS | Status: DC
  Administered 2020-02-19 – 2020-02-21 (×7): 5 [IU] via SUBCUTANEOUS

## 2020-02-19 NOTE — Evaluation (Signed)
Occupational Therapy Evaluation Patient Details Name: [name] MRN: [id number] DOB: 21-Feb-1951 Today's Date: 02/19/2020    History of Present Illness Pt is a 69 y/o female with PMH of chronic iron deficiency anemia, hypothyroidism, CAD s/p stents x6, HTN, DM on oral hypoglycemics, hyperlipidemia presented to the emergency room with cough and shortness of breath, generalized weakness with recently diagnosed Covid 19 infection as well as weakness of the body left more than right.  At the ED, code stroke was triggered.  CT head, MRI negative for acute stroke; Pt is a 69 y/o female with PMH of chronic iron deficiency anemia, hypothyroidism, CAD s/p stents x6, HTN, DM on oral hypoglycemics, hyperlipidemia presented to the emergency room with cough and shortness of breath, generalized weakness with recently diagnosed Covid 19 infection as well as weakness of the body left more than right.  At the ED, code stroke was triggered.  CT head, MRI negative for acute stroke, but showed chronic microvascular ischemic changes.  Small chronic Rt thalamic infarct.  Chest x-ray showed bilateral multifocal infiltrates compatible with COVID-19 infection. .   Clinical Impression   Pt admitted with above. She demonstrates the below listed deficits and will benefit from continued OT to maximize safety and independence with BADLs.  Pt presents to OT with generalized weakness, decreased activity tolerance, impaired balance, as well as cognitive deficits.  She currently requires min guard - min A for ADLs and min A +2 for functional mobility.  She fatigues quickly with activity with DOE 3/4.  VSS.  She reports she lives with her spouse and other family members, who can provide 24 hour assist.  She reports she was fully independent PTA.  Recommend HHOT.       Follow Up Recommendations  Home health OT;Supervision/Assistance - 24 hour    Equipment Recommendations  None recommended by OT    Recommendations for Other  Services       Precautions / Restrictions Precautions Precautions: Fall Restrictions Weight Bearing Restrictions: No      Mobility Bed Mobility Overal bed mobility: Needs Assistance Bed Mobility: Supine to Sit     Supine to sit: Supervision     General bed mobility comments: HOB elevated - no assist required. Increased time to transition fully to EOB.   Transfers Overall transfer level: Needs assistance Equipment used: 2 person hand held assist Transfers: Sit to/from Stand Sit to Stand: Min guard;+2 safety/equipment         General transfer comment: Pt able to power-up to full stand from EOB with 2 person HHA to gain/maintain standing balance.     Balance Overall balance assessment: Needs assistance Sitting-balance support: Feet supported;No upper extremity supported Sitting balance-Leahy Scale: Fair Sitting balance - Comments: Noted posterior lean with LE MMT   Standing balance support: During functional activity Standing balance-Leahy Scale: Poor Standing balance comment: leaning against the sink for support while washing hands and requires up to min A due to LOB                            ADL either performed or assessed with clinical judgement   ADL Overall ADL's : Needs assistance/impaired Eating/Feeding: Modified independent;Sitting;Bed level   Grooming: Wash/dry hands;Wash/dry face;Oral care;Brushing hair;Minimal assistance;Standing Grooming Details (indicate cue type and reason): pt with lean to the Rt requiring occasional assist to correct  Upper Body Bathing: Supervision/ safety;Set up;Sitting   Lower Body Bathing: Minimal assistance;Sit to/from stand   Upper  Body Dressing : Set up;Sitting   Lower Body Dressing: Minimal assistance;Sit to/from stand   Toilet Transfer: Minimal assistance;+2 for physical assistance;+2 for safety/equipment;Ambulation;Comfort height toilet;Grab bars   Toileting- Clothing Manipulation and Hygiene: Minimal  assistance;Sit to/from stand       Functional mobility during ADLs: Minimal assistance;+2 for physical assistance;+2 for safety/equipment General ADL Comments: Pt requires assist for balance which worsens as she fatigues.  DOE 3/4 with activity - fatigues quickly      Vision Baseline Vision/History: Wears glasses Wears Glasses: Reading only Patient Visual Report: No change from baseline Vision Assessment?: Yes Eye Alignment: Within Functional Limits Ocular Range of Motion: Within Functional Limits Alignment/Gaze Preference: Within Defined Limits Tracking/Visual Pursuits: Other (comment) Visual Fields: No apparent deficits Additional Comments: nystagmus noted with Lt gaze      Perception Perception Perception Tested?: Yes   Praxis Praxis Praxis tested?: Within functional limits    Pertinent Vitals/Pain Pain Assessment: No/denies pain     Hand Dominance Right   Extremity/Trunk Assessment Upper Extremity Assessment Upper Extremity Assessment: Generalized weakness;RUE deficits/detail;LUE deficits/detail RUE Deficits / Details: Noted tremor in R hand which pt indicates is new with onset of COVID  RUE Sensation: WNL (Per pt report) RUE Coordination: decreased gross motor;decreased fine motor LUE Deficits / Details: dysmetria noted  LUE Sensation: WNL (per pt report) LUE Coordination: decreased gross motor;decreased fine motor (finger to nose)   Lower Extremity Assessment Lower Extremity Assessment: Defer to PT evaluation RLE Deficits / Details: 5/5 strength with MMT in quads, hamstrings, hip flexors, DF, however noted decreased muscular endurance with OOB mobility RLE Sensation: WNL (per pt report) LLE Deficits / Details: Mildly weaker than the R, 5/5 strength in quads, 4/5 strength in hamstrings, hip flexors, and DF. Also noted decreased muscular endurance with OOB mobility.  LLE Sensation: WNL (per pt report)   Cervical / Trunk Assessment Cervical / Trunk Assessment:  Other exceptions Cervical / Trunk Exceptions: forward head posture with rounded shoulders   Communication Communication Communication: No difficulties   Cognition Arousal/Alertness: Awake/alert Behavior During Therapy: WFL for tasks assessed/performed Overall Cognitive Status: Impaired/Different from baseline Area of Impairment: Memory;Safety/judgement;Problem solving                     Memory: Decreased short-term memory   Safety/Judgement: Decreased awareness of safety   Problem Solving: Slow processing;Requires verbal cues General Comments: the short blessed test was administered with pt scoring 4/28.     General Comments  02 sats 87-94% on RA     Exercises     Shoulder Instructions      Home Living Family/patient expects to be discharged to:: Private residence Living Arrangements: Spouse/significant other;Children;Other relatives Available Help at Discharge: Family;Available 24 hours/day Type of Home: Mobile home Home Access: Stairs to enter Entrance Stairs-Number of Steps: 3 Entrance Stairs-Rails: None Home Layout: One level     Bathroom Shower/Tub: Walk-in shower;Tub/shower unit   Bathroom Toilet: Standard     Home Equipment: Environmental consultant - 2 wheels;Bedside commode   Additional Comments: Pt lives with spouse, daughter, grand daughter       Prior Functioning/Environment Level of Independence: Independent with assistive device(s)  Gait / Transfers Assistance Needed: Just started using RW prior to coming to the hospital ADL's / Homemaking Assistance Needed: Drives, cooks/cleans   Comments: 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        OT Problem List: Decreased strength;Decreased activity tolerance;Impaired balance (sitting and/or standing);Decreased cognition;Decreased coordination;Decreased safety awareness;Decreased knowledge of use of  DME or AE;Cardiopulmonary status limiting activity      OT Treatment/Interventions: Self-care/ADL  training;Therapeutic exercise;Neuromuscular education;DME and/or AE instruction;Therapeutic activities;Cognitive remediation/compensation;Patient/family education;Balance training;Energy conservation    OT Goals(Current goals can be found in the care plan section) Acute Rehab OT Goals Patient Stated Goal: to go home!  OT Goal Formulation: With patient Time For Goal Achievement: 03/04/20 Potential to Achieve Goals: Good ADL Goals Pt Will Perform Grooming: standing;with min guard assist Pt Will Perform Lower Body Bathing: with min guard assist;sit to/from stand Pt Will Perform Lower Body Dressing: with min guard assist;sit to/from stand Pt Will Transfer to Toilet: with min guard assist;ambulating;regular height toilet;bedside commode Pt Will Perform Toileting - Clothing Manipulation and hygiene: with min guard assist;sit to/from stand Pt/caregiver will Perform Home Exercise Program: Increased strength;Right Upper extremity;Left upper extremity;With theraband;With written HEP provided  OT Frequency: Min 2X/week   Barriers to D/C:            Co-evaluation              AM-PAC OT "6 Clicks" Daily Activity     Outcome Measure Help from another person eating meals?: None Help from another person taking care of personal grooming?: A Little Help from another person toileting, which includes using toliet, bedpan, or urinal?: A Little Help from another person bathing (including washing, rinsing, drying)?: A Little Help from another person to put on and taking off regular upper body clothing?: A Little Help from another person to put on and taking off regular lower body clothing?: A Little 6 Click Score: 19   End of Session Equipment Utilized During Treatment: Gait belt Nurse Communication: Mobility status  Activity Tolerance: Patient limited by fatigue Patient left: in chair;with call bell/phone within reach;with chair alarm set  OT Visit Diagnosis: Unsteadiness on feet  (R26.81);Muscle weakness (generalized) (M62.81)                Time: 3437-3578 OT Time Calculation (min): 55 min Charges:  OT General Charges $OT Visit: 1 Visit OT Evaluation $OT Eval Moderate Complexity: 1 Mod OT Treatments $Self Care/Home Management : 8-22 mins  Nilsa Nutting., OTR/L Acute Rehabilitation Services Pager [phone] Office [phone]   Lucille Passy M 02/19/2020, 5:06 PM

## 2020-02-19 NOTE — Progress Notes (Signed)
Pt admitted to the unit from ED. Pt A&O x4, ambulated to the BR with x1 assist. Pt placed on telemetry and verified with CCMD, VSS, skin intact with no pressure or opened wounds noted except for moist associated skin redness noted to underneath both breast. Pt oriented to the unit and room, call light with in reach and bed alarm on. COVID precaution initiated. Will continue to closely monitor pt. Francis Gaines Willem Klingensmith RN.   02/18/20 2252  Vitals  Temp 97.9 F (36.6 C)  Temp Source Oral  BP 136/66  MAP (mmHg) 88  BP Location Right Arm  BP Method Automatic  Patient Position (if appropriate) Lying  Pulse Rate 74  Resp 20  Level of Consciousness  Level of Consciousness Alert  MEWS COLOR  MEWS Score Color Green  Oxygen Therapy  SpO2 99 %  Pain Assessment  Pain Scale 0-10  Pain Score 0  PCA/Epidural/Spinal Assessment  Respiratory Pattern Regular;Unlabored  Incentive Spirometry - Achieved (mL) (RN, NT, or RT) 1250 mL  ECG Monitoring  Cardiac Rhythm NSR  MEWS Score  MEWS Temp 0  MEWS Systolic 0  MEWS Pulse 0  MEWS RR 0  MEWS LOC 0  MEWS Score 0

## 2020-02-19 NOTE — Progress Notes (Signed)
STROKE TEAM PROGRESS NOTE   INTERVAL HISTORY Patient is doing fine.  No complaints today. 2D echo is unremarkable Vitals:   02/18/20 2252 02/19/20 0030 02/19/20 0319 02/19/20 0930  BP: 136/66 (!) 147/67 128/71   Pulse: 74 72 68   Resp: 20 16 16    Temp: 97.9 F (36.6 C) 97.7 F (36.5 C) 97.8 F (36.6 C) 97.9 F (36.6 C)  TempSrc: Oral Oral Oral Oral  SpO2: 99% 95% 94%   Weight:      Height:       CBC:  Recent Labs  Lab 02/18/20 1037 02/19/20 0219  WBC 3.5* 5.0  NEUTROABS 3.2 4.1  HGB 10.8* 11.2*  HCT 33.5* 33.5*  MCV 94.9 92.5  PLT 132* 629   Basic Metabolic Panel:  Recent Labs  Lab 02/18/20 1037 02/19/20 0219  NA 134* 133*  K 3.9 3.5  CL 106 106  CO2 17* 16*  GLUCOSE 244* 377*  BUN 45* 47*  CREATININE 2.36* 2.06*  CALCIUM 8.1* 8.2*  MG 1.4* 2.1  PHOS 4.5 3.5   Lipid Panel:  Recent Labs  Lab 02/18/20 1037  CHOL 78  TRIG 102  HDL 29*  CHOLHDL 2.7  VLDL 20  LDLCALC 29   HgbA1c:  Recent Labs  Lab 02/18/20 1037  HGBA1C 8.5*   Urine Drug Screen: No results for input(s): LABOPIA, COCAINSCRNUR, LABBENZ, AMPHETMU, THCU, LABBARB in the last 168 hours.  Alcohol Level No results for input(s): ETH in the last 168 hours.  IMAGING past 24 hours ECHOCARDIOGRAM COMPLETE  Result Date: 02/18/2020    ECHOCARDIOGRAM REPORT   Patient Name:   [name] Date of Exam: 02/18/2020 Medical Rec #:  [id number]        Height:       59.5 in Accession #:    [id number]       Weight:       193.3 lb Date of Birth:  1950/08/18         BSA:          1.829 m Patient Age:    17 years         BP:           133/72 mmHg Patient Gender: F                HR:           87 bpm. Exam Location:  Inpatient Procedure: 2D Echo Indications:    435.9 TIA  History:        Patient has prior history of Echocardiogram examinations, most                 recent 01/29/2019. CAD; Risk Factors:Hypertension, Diabetes and                 Dyslipidemia. Covid +.  Sonographer:    [name] RDCS (AE)  Referring Phys: [id number] [name]  Sonographer Comments: Patient is morbidly obese. Image acquisition challenging due to patient body habitus. see comments IMPRESSIONS  1. Left ventricular ejection fraction, by estimation, is 55 to 60%. The left ventricle has normal function. The left ventricle has no regional wall motion abnormalities. Left ventricular diastolic parameters were normal.  2. Right ventricular systolic function is low normal. The right ventricular size is mildly enlarged.  3. The mitral valve is normal in structure. No evidence of mitral valve regurgitation.  4. The aortic valve is grossly normal. Aortic valve regurgitation is not visualized. Comparison(s): The left ventricular  function is unchanged. FINDINGS  Left Ventricle: Left ventricular ejection fraction, by estimation, is 55 to 60%. The left ventricle has normal function. The left ventricle has no regional wall motion abnormalities. The left ventricular internal cavity size was normal in size. There is  no left ventricular hypertrophy. Left ventricular diastolic parameters were normal. Right Ventricle: The right ventricular size is mildly enlarged. Right vetricular wall thickness was not assessed. Right ventricular systolic function is low normal. Left Atrium: Left atrial size was normal in size. Right Atrium: Right atrial size was normal in size. Pericardium: There is no evidence of pericardial effusion. Mitral Valve: The mitral valve is normal in structure. No evidence of mitral valve regurgitation. Tricuspid Valve: The tricuspid valve is normal in structure. Tricuspid valve regurgitation is trivial. Aortic Valve: The aortic valve is grossly normal. Aortic valve regurgitation is not visualized. Pulmonic Valve: The pulmonic valve was grossly normal. Pulmonic valve regurgitation is not visualized. Aorta: The aortic root and ascending aorta are structurally normal, with no evidence of dilitation. IAS/Shunts: The interatrial septum was not  assessed.  LEFT VENTRICLE PLAX 2D LVIDd:         3.40 cm  Diastology LVIDs:         2.50 cm  LV e' lateral:   9.68 cm/s LV PW:         0.90 cm  LV E/e' lateral: 10.6 LV IVS:        0.90 cm  LV e' medial:    6.96 cm/s LVOT diam:     2.10 cm  LV E/e' medial:  14.8 LV SV:         66 LV SV Index:   36 LVOT Area:     3.46 cm  RIGHT VENTRICLE TAPSE (M-mode): 1.0 cm LEFT ATRIUM             Index       RIGHT ATRIUM           Index LA diam:        3.20 cm 1.75 cm/m  RA Area:     13.00 cm LA Vol (A2C):   41.3 ml 22.59 ml/m RA Volume:   27.70 ml  15.15 ml/m LA Vol (A4C):   41.6 ml 22.75 ml/m LA Biplane Vol: 42.0 ml 22.97 ml/m  AORTIC VALVE LVOT Vmax:   80.50 cm/s LVOT Vmean:  56.000 cm/s LVOT VTI:    0.191 m  AORTA Ao Root diam: 3.30 cm MITRAL VALVE MV Area (PHT): 2.99 cm     SHUNTS MV Decel Time: 254 msec     Systemic VTI:  0.19 m MV E velocity: 103.00 cm/s  Systemic Diam: 2.10 cm MV A velocity: 84.40 cm/s MV E/A ratio:  1.22 Kelli Carnes MD Electronically signed by Kelli Carnes MD Signature Date/Time: 02/18/2020/5:29:51 PM    Final     PHYSICAL EXAM   Pleasant elderly Caucasian lady in mild respiratory distress.  On nasal cannula oxygen. . Afebrile. Head is nontraumatic. Neck is supple without bruit.    Cardiac exam no murmur or gallop. Lungs are clear to auscultation. Distal pulses are well felt. Neurological Exam ;  Awake  Alert oriented x 3. Normal speech and language.eye movements full without nystagmus.fundi were not visualized. Vision acuity and fields appear normal. Hearing is normal. Palatal movements are normal. Face symmetric. Tongue midline. Normal strength, tone, reflexes and coordination but poor effort and testing bilateral lower extremities with drift.. Normal sensation. Gait deferred.   ASSESSMENT/PLAN [name]  is a 69 y.o. female with history of diabetes, myocardial infarct, migraines, hypertension, hyperlipidemia, CKD, CAD who was dx w/ COVID 4 days prior to arrival presenting with  speech difficulties and L sided weakness.   Possible right hemispheric TIA due to small vessel disease in setting of recent Covid infection.  Code Stroke CT head No acute abnormality. Old R thalamic lacune. ASPECTS 10.     CTA head & neck no LVO. L P2 stenosis. Patchy B PNA. Nodular thyroid.   MRI  No acute infarct, hemorrhage or mass. Small vessel disease. Old R thalamic infarct   2D Echo EF 55-60%. No source of embolus    LDL 29  HgbA1c 8.4  VTE prophylaxis - Heparin 5000 units sq tid   aspirin 81 mg daily prescribed prior to admission but pt states not taking, now on aspirin 81 mg daily. Continue at d/c.  Therapy recommendations:  pending    Disposition:  pending   COVID - 19 Infection w/ PNA  Dx 4d prior to admission   On remdisivir and steroid  DDimer 1.16->0.97   CRP 12.1->7.9  Hypertension  Stable . BP goal normotensive  Hyperlipidemia  Home meds:  lipitor 80, zetia 10, fenofibrate 48, resumed in hospital  LDL 29  Continue statin at discharge  Diabetes type II Uncontrolled  HgbA1c 8.4, goal < 7.0  Other Stroke Risk Factors  Advanced age  Former Cigarette smoker, quit 30 yrs ago  Obesity, Body mass index is 38.4 kg/m., recommend weight loss, diet and exercise as appropriate   Coronary artery disease s/p CABG, DES  Migraines  Obstructive sleep apnea, on CPAP at home  Other Active Problems  Thyroid nodules, incidental finding. For OP [place]  Acute renal failure   Hospital day # 2  Continue aspirin for stroke prevention.  Aggressive risk factor modification.  Stroke team will sign off.  Kindly call for questions.  Antony Contras, MD  To contact Stroke Continuity provider, please refer to [email or web address] After hours, contact General Neurology

## 2020-02-19 NOTE — Progress Notes (Signed)
SLP Cancellation Note  Patient Details Name: [name] MRN: [id number] DOB: [birth year]   Cancelled treatment:       Reason Eval/Treat Not Completed: SLP screened via chart. Orders initiated as part of code stroke. Pt has a negative MRI and per staff confusion has resolved. Will defer cognitive linguistic eval, will sign off  Herbie Baltimore, MA CCC-SLP  Acute Rehabilitation Services Pager [phone] Office [phone]  Lynann Beaver 02/19/2020, 10:40 AM

## 2020-02-19 NOTE — Progress Notes (Signed)
PROGRESS NOTE    [name]  ZES:[id number] DOB: 08/24/1950 DOA: 02/17/2020 PCP: Carol Ada, MD    Brief Narrative:  69 year old female with history of chronic iron deficiency anemia, hypothyroidism, coronary artery disease status post stents x6, hypertension, diabetes on oral hypoglycemics, hyperlipidemia presented to the emergency room with cough and shortness of breath, generalized weakness with recently diagnosed Covid 19 infection as well as weakness of the body left more than right.  All her family was exposed to the infection.  They went to [facility] 4 days ago to have COVID-19 test done and was positive.  She stayed home, however could not eat or drink anything.  Felt very weak.  Yesterday, her daughter noted that her left side was weak so EMS was called. At the emergency room, code stroke was triggered.  CT head, CTA of the head and neck showed no critical narrow blood vessels.  Chest x-ray showed bilateral multifocal infiltrates compatible with COVID-19 infection.  Creatinine was 2.4, elevated from baseline.  She was needing 1 or 2 L of oxygen. Admitted with acute stroke versus TIA, COVID-19 pneumonia.  Assessment & Plan:   Active Problems:   COVID-19 virus infection   TIA (transient ischemic attack)  Suspected right hemispheric stroke: Ischemic stroke.  Negative MRI. Clinical findings, generalized weakness.  Left upper extremity weakness more than the right. Currently resolved. CT head findings, normal. MRI of the brain, no evidence of acute stroke.  Chronic microvascular changes. CT of the head and neck, no major blockage. 2D echocardiogram, normal.  No source of embolism. Antiplatelet therapy, on aspirin at home, but not taking.  Started on baby aspirin. LDL 29.  On multiple regimen that we will continue. Hemoglobin A1c more than 8. DVT prophylaxis, heparin. Therapy recommendations, pending. Followed by neurology.  No new recommendations.  Continue to work with PT  OT.  Pneumonia due to COVID-19 virus: chest physiotherapy, incentive spirometry, deep breathing exercises, sputum induction, mucolytic's and bronchodilators. Supplemental oxygen to keep saturations more than 90%. Covid directed therapy with , steroids Solu-Medrol, 0.5 mg/kg/day remdesivir, day 3/5 antibiotics, not indicated Due to severity of symptoms, patient will need daily inflammatory markers,  liver function test to monitor and direct COVID-19 therapies. Patient is okay to get all therapeutics and treatments including experimental treatments for COVID-19 pneumonia, however she does not believe on COVID-19 vaccination.  Acute renal failure: Due to poor oral intake.  Continue maintenance IV fluids.  Monitor closely. Renal ultrasound was normal.  Type 2 diabetes: Poorly controlled. A1c 8.5.  On Metformin and amaryl at home.  Currently on sliding scale insulin.  Expected to hyperglycemia with use of steroids for COVID-19 pneumonia.  Increase long-acting insulin. Patient is followed by her primary care physician who is planning to put her on once weekly injection of Trulicity?  Hypertension: Blood pressures stable.  Holding lisinopril and other antihypertensives until clinical improvement.  COVID-19 Labs  Recent Labs    02/17/20 1220 02/18/20 1037 02/19/20 0219  DDIMER 1.14* 1.16* 0.97*  FERRITIN 769* 914* 789*  LDH 196*  --   --   CRP 10.2* 12.1* 7.9*    Lab Results  Component Value Date   SARSCOV2NAA Not Detected 05/05/2019   SpO2: 94 % O2 Flow Rate (L/min): 0.5 L/min   DVT prophylaxis: heparin injection 5,000 Units Start: 02/17/20 1315   Code Status: Full code Family Communication: Daughter on the phone Disposition Plan: Status is: Inpatient  Remains inpatient appropriate because:IV treatments appropriate due to intensity of illness  or inability to take PO and Inpatient level of care appropriate due to severity of illness   Dispo: The patient is from: Home               Anticipated d/c is to: Home with home health.              Anticipated d/c date is: Architectural technologist.              Patient currently is not medically stable to d/c.    Consultants:   Neurology  Procedures:   None  Antimicrobials:  Antibiotics Given (last 72 hours)    Date/Time Action Medication Dose Rate   02/17/20 1442 New Bag/Given   remdesivir 200 mg in sodium chloride 0.9% 250 mL IVPB 200 mg 580 mL/hr   02/18/20 1030 New Bag/Given   remdesivir 100 mg in sodium chloride 0.9 % 100 mL IVPB 100 mg 200 mL/hr   02/19/20 1049 New Bag/Given   remdesivir 100 mg in sodium chloride 0.9 % 100 mL IVPB 100 mg 200 mL/hr         Subjective: Seen and examined.  On room air.  Still has some weakness but better than yesterday.  Was able to eat and drink.  She has not been mobilized yet.  Objective: Vitals:   02/18/20 2252 02/19/20 0030 02/19/20 0319 02/19/20 0930  BP: 136/66 (!) 147/67 128/71   Pulse: 74 72 68   Resp: 20 16 16    Temp: 97.9 F (36.6 C) 97.7 F (36.5 C) 97.8 F (36.6 C) 97.9 F (36.6 C)  TempSrc: Oral Oral Oral Oral  SpO2: 99% 95% 94%   Weight:      Height:        Intake/Output Summary (Last 24 hours) at 02/19/2020 1347 Last data filed at 02/19/2020 0530 Gross per 24 hour  Intake 1898.87 ml  Output --  Net 1898.87 ml   Filed Weights   02/17/20 1100 02/17/20 1141  Weight: 87.7 kg 87.7 kg    Examination:  General exam: Appears calm and comfortable  Not in any distress.  On room air. Respiratory system: Clear to auscultation. Respiratory effort normal.  No added sounds. Cardiovascular system: S1 & S2 heard, RRR. No JVD, murmurs, rubs, gallops or clicks. No pedal edema. Gastrointestinal system: Abdomen is nondistended, soft and nontender. No organomegaly or masses felt. Normal bowel sounds heard.  Obese and pendulous. Central nervous system: Alert and oriented.  No cranial nerve deficits. She has equal motor power on all 4 extremities, no asymmetrical  weakness noted today.   Data Reviewed: I have personally reviewed following labs and imaging studies  CBC: Recent Labs  Lab 02/17/20 1100 02/17/20 1101 02/18/20 1037 02/19/20 0219  WBC 6.4  --  3.5* 5.0  NEUTROABS 5.5  --  3.2 4.1  HGB 11.9* 11.9* 10.8* 11.2*  HCT 37.0 35.0* 33.5* 33.5*  MCV 96.1  --  94.9 92.5  PLT 137*  --  132* 500   Basic Metabolic Panel: Recent Labs  Lab 02/17/20 1100 02/17/20 1101 02/18/20 1037 02/19/20 0219  NA 136 136 134* 133*  K 3.8 4.0 3.9 3.5  CL 104 103 106 106  CO2 18*  --  17* 16*  GLUCOSE 195* 187* 244* 377*  BUN 35* 46* 45* 47*  CREATININE 2.42* 2.50* 2.36* 2.06*  CALCIUM 8.5*  --  8.1* 8.2*  MG  --   --  1.4* 2.1  PHOS  --   --  4.5 3.5  GFR: Estimated Creatinine Clearance: 24.8 mL/min (A) (by C-G formula based on SCr of 2.06 mg/dL (H)). Liver Function Tests: Recent Labs  Lab 02/17/20 1100 02/18/20 1037 02/19/20 0219  AST 44* 35 36  ALT 41 35 34  ALKPHOS 77 64 70  BILITOT 0.6 0.7 0.3  PROT 5.6* 5.3* 5.3*  ALBUMIN 2.9* 2.5* 2.4*   No results for input(s): LIPASE, AMYLASE in the last 168 hours. No results for input(s): AMMONIA in the last 168 hours. Coagulation Profile: Recent Labs  Lab 02/17/20 1100  INR 1.0   Cardiac Enzymes: No results for input(s): CKTOTAL, CKMB, CKMBINDEX, TROPONINI in the last 168 hours. BNP (last 3 results) No results for input(s): PROBNP in the last 8760 hours. HbA1C: Recent Labs    02/17/20 1100 02/18/20 1037  HGBA1C 8.4* 8.5*   CBG: Recent Labs  Lab 02/18/20 1141 02/18/20 2304 02/19/20 0625 02/19/20 0751 02/19/20 1225  GLUCAP 291* 360* 284* 300* 299*   Lipid Profile: Recent Labs    02/17/20 1220 02/18/20 1037  CHOL  --  78  HDL  --  29*  LDLCALC  --  29  TRIG 203* 102  CHOLHDL  --  2.7   Thyroid Function Tests: Recent Labs    02/17/20 1220  TSH 0.379   Anemia Panel: Recent Labs    02/18/20 1037 02/19/20 0219  FERRITIN 914* 789*   Sepsis Labs: Recent  Labs  Lab 02/17/20 1220 02/17/20 1657  PROCALCITON 0.14  --   LATICACIDVEN 2.9* 2.2*    Recent Results (from the past 240 hour(s))  Blood Culture (routine x 2)     Status: None (Preliminary result)   Collection Time: 02/17/20 12:20 PM   Specimen: BLOOD  Result Value Ref Range Status   Specimen Description BLOOD RIGHT ANTECUBITAL  Final   Special Requests   Final    BOTTLES DRAWN AEROBIC ONLY Blood Culture adequate volume   Culture   Final    NO GROWTH 2 DAYS Performed at [facility], [place] [address], [place], [place] [address]    Report Status PENDING  Incomplete  Blood Culture (routine x 2)     Status: None (Preliminary result)   Collection Time: 02/17/20 12:31 PM   Specimen: BLOOD  Result Value Ref Range Status   Specimen Description BLOOD RIGHT ANTECUBITAL  Final   Special Requests   Final    BOTTLES DRAWN AEROBIC AND ANAEROBIC Blood Culture adequate volume   Culture   Final    NO GROWTH 2 DAYS Performed at [facility], [place] [address], [place], [place] [address]    Report Status PENDING  Incomplete         Radiology Studies: MR BRAIN WO CONTRAST  Result Date: 02/18/2020 CLINICAL DATA:  Left-sided weakness, code stroke follow-up EXAM: MRI HEAD WITHOUT CONTRAST TECHNIQUE: Multiplanar, multiecho pulse sequences of the brain and surrounding structures were obtained without intravenous contrast. COMPARISON:  None. FINDINGS: Brain: There is no acute infarction or intracranial hemorrhage. There is no intracranial mass, mass effect, or edema. There is no hydrocephalus or extra-axial fluid collection. Prominence of the ventricles and sulci reflects generalized parenchymal volume loss. Patchy foci of T2 hyperintensity in the supratentorial white matter are nonspecific but may reflect mild to moderate chronic microvascular ischemic changes. There is a chronic small vessel infarct of the right thalamus. Vascular: Major vessel flow voids at the skull base are preserved.  Skull and upper cervical spine: Normal marrow signal is preserved. Sinuses/Orbits: Paranasal sinuses  mucosal thickening. Orbits are unremarkable. Other: Sella is partially empty.  Mastoid air cells are clear. IMPRESSION: No evidence of recent infarction, hemorrhage, or mass. Chronic microvascular ischemic changes. Small chronic right thalamic infarct. Electronically Signed   By: Macy Mis M.D.   On: 02/18/2020 09:12   US RENAL  Result Date: 02/17/2020 CLINICAL DATA:  Acute renal insufficiency. EXAM: RENAL / URINARY TRACT ULTRASOUND COMPLETE COMPARISON:  None. FINDINGS: Right Kidney: Renal measurements: 11.1 cm x 4.9 cm x 5.0 cm = volume: 143.2 mL. There is diffuse renal cortical thinning. Echogenicity within normal limits. No mass or hydronephrosis visualized. Left Kidney: Renal measurements: 9.0 cm x 4.8 cm x 5.2 cm = volume: 117.5 mL. There is diffuse renal cortical thinning. Echogenicity within normal limits. No mass or hydronephrosis visualized. Bladder: Appears normal for degree of bladder distention. Other: Of incidental note is the presence of diffusely increased echogenicity of the liver parenchyma. IMPRESSION: 1. Diffuse bilateral renal cortical thinning. 2. Fatty liver. Electronically Signed   By: Virgina Norfolk M.D.   On: 02/17/2020 16:41   ECHOCARDIOGRAM COMPLETE  Result Date: 02/18/2020    ECHOCARDIOGRAM REPORT   Patient Name:   [facility] Date of Exam: 02/18/2020 Medical Rec #:  [id number]        Height:       59.5 in Accession #:    [id number]       Weight:       193.3 lb Date of Birth:  [birth year]         BSA:          1.829 m Patient Age:    12 years         BP:           133/72 mmHg Patient Gender: F                HR:           87 bpm. Exam Location:  Inpatient Procedure: 2D Echo Indications:    435.9 TIA  History:        Patient has prior history of Echocardiogram examinations, most                 recent 01/29/2019. CAD; Risk Factors:Hypertension, Diabetes and                  Dyslipidemia. Covid +.  Sonographer:    Jannett Celestine RDCS (AE) Referring Phys: [id number] Lequita Halt  Sonographer Comments: Patient is morbidly obese. Image acquisition challenging due to patient body habitus. see comments IMPRESSIONS  1. Left ventricular ejection fraction, by estimation, is 55 to 60%. The left ventricle has normal function. The left ventricle has no regional wall motion abnormalities. Left ventricular diastolic parameters were normal.  2. Right ventricular systolic function is low normal. The right ventricular size is mildly enlarged.  3. The mitral valve is normal in structure. No evidence of mitral valve regurgitation.  4. The aortic valve is grossly normal. Aortic valve regurgitation is not visualized. Comparison(s): The left ventricular function is unchanged. FINDINGS  Left Ventricle: Left ventricular ejection fraction, by estimation, is 55 to 60%. The left ventricle has normal function. The left ventricle has no regional wall motion abnormalities. The left ventricular internal cavity size was normal in size. There is  no left ventricular hypertrophy. Left ventricular diastolic parameters were normal. Right Ventricle: The right ventricular size is mildly enlarged. Right vetricular wall thickness was not assessed. Right ventricular systolic function is low normal.  Left Atrium: Left atrial size was normal in size. Right Atrium: Right atrial size was normal in size. Pericardium: There is no evidence of pericardial effusion. Mitral Valve: The mitral valve is normal in structure. No evidence of mitral valve regurgitation. Tricuspid Valve: The tricuspid valve is normal in structure. Tricuspid valve regurgitation is trivial. Aortic Valve: The aortic valve is grossly normal. Aortic valve regurgitation is not visualized. Pulmonic Valve: The pulmonic valve was grossly normal. Pulmonic valve regurgitation is not visualized. Aorta: The aortic root and ascending aorta are structurally normal, with no  evidence of dilitation. IAS/Shunts: The interatrial septum was not assessed.  LEFT VENTRICLE PLAX 2D LVIDd:         3.40 cm  Diastology LVIDs:         2.50 cm  LV e' lateral:   9.68 cm/s LV PW:         0.90 cm  LV E/e' lateral: 10.6 LV IVS:        0.90 cm  LV e' medial:    6.96 cm/s LVOT diam:     2.10 cm  LV E/e' medial:  14.8 LV SV:         66 LV SV Index:   36 LVOT Area:     3.46 cm  RIGHT VENTRICLE TAPSE (M-mode): 1.0 cm LEFT ATRIUM             Index       RIGHT ATRIUM           Index LA diam:        3.20 cm 1.75 cm/m  RA Area:     13.00 cm LA Vol (A2C):   41.3 ml 22.59 ml/m RA Volume:   27.70 ml  15.15 ml/m LA Vol (A4C):   41.6 ml 22.75 ml/m LA Biplane Vol: 42.0 ml 22.97 ml/m  AORTIC VALVE LVOT Vmax:   80.50 cm/s LVOT Vmean:  56.000 cm/s LVOT VTI:    0.191 m  AORTA Ao Root diam: 3.30 cm MITRAL VALVE MV Area (PHT): 2.99 cm     SHUNTS MV Decel Time: 254 msec     Systemic VTI:  0.19 m MV E velocity: 103.00 cm/s  Systemic Diam: 2.10 cm MV A velocity: 84.40 cm/s MV E/A ratio:  1.22 Dorris Carnes MD Electronically signed by Dorris Carnes MD Signature Date/Time: 02/18/2020/5:29:51 PM    Final         Scheduled Meds: . aspirin EC  81 mg Oral Daily  . atorvastatin  80 mg Oral Daily  . ezetimibe  10 mg Oral Daily  . fenofibrate  54 mg Oral Daily  . heparin  5,000 Units Subcutaneous Q12H  . insulin aspart  0-5 Units Subcutaneous QHS  . insulin aspart  0-9 Units Subcutaneous TID WC  . insulin aspart  5 Units Subcutaneous TID WC  . insulin detemir  16 Units Subcutaneous BID  . Ipratropium-Albuterol  1 puff Inhalation Q6H  . methylPREDNISolone (SOLU-MEDROL) injection  0.5 mg/kg Intravenous Q12H  . metoprolol tartrate  25 mg Oral BID  . sodium chloride flush  3 mL Intravenous Once   Continuous Infusions: . sodium chloride 100 mL/hr at 02/19/20 1332  . remdesivir 100 mg in NS 100 mL 100 mg (02/19/20 1049)     LOS: 2 days    Time spent: 35 minutes    Barb Merino, MD [facility] Pager [phone]

## 2020-02-19 NOTE — Evaluation (Signed)
Physical Therapy Evaluation Patient Details Name: [name] MRN: [id number] DOB: Dec 26, 1950 Today's Date: 02/19/2020   History of Present Illness  Pt is a 69 y/o female with PMH of chronic iron deficiency anemia, hypothyroidism, CAD s/p stents x6, HTN, DM on oral hypoglycemics, hyperlipidemia presented to the emergency room with cough and shortness of breath, generalized weakness with recently diagnosed Covid 19 infection as well as weakness of the body left more than right.  At the ED, code stroke was triggered.  CT head, MRI negative for acute stroke. Chest x-ray showed bilateral multifocal infiltrates compatible with COVID-19 infection.   Clinical Impression  Pt admitted with above diagnosis. Pt currently with functional limitations due to the deficits listed below (see PT Problem List). At the time of PT eval pt was able to perform transfers and ambulation with up to +2 min assist for balance support and safety. I do anticipate she would be more independent with a walker (there was not one in the room to try), but will still require at least supervision. It appears pt will have good family support at d/c, but would benefit from continued therapies as she has had a decline in function and in strength compared to her baseline. Acutely, pt will benefit from skilled PT to increase their independence and safety with mobility to allow discharge to the venue listed below.       Follow Up Recommendations Home health PT (Covid Cares Program if eligible?)    Equipment Recommendations  None recommended by PT    Recommendations for Other Services       Precautions / Restrictions Precautions Precautions: Fall Restrictions Weight Bearing Restrictions: No      Mobility  Bed Mobility Overal bed mobility: Needs Assistance Bed Mobility: Supine to Sit     Supine to sit: Supervision     General bed mobility comments: HOB elevated - no assist required. Increased time to transition fully to  EOB.   Transfers Overall transfer level: Needs assistance Equipment used: 2 person hand held assist Transfers: Sit to/from Stand Sit to Stand: Min guard;+2 safety/equipment         General transfer comment: Pt able to power-up to full stand from EOB with 2 person HHA to gain/maintain standing balance.   Ambulation/Gait Ambulation/Gait assistance: Min assist;+2 physical assistance;+2 safety/equipment Gait Distance (Feet): 20 Feet Assistive device: 2 person hand held assist Gait Pattern/deviations: Shuffle;Trunk flexed;Narrow base of support Gait velocity: Decreased Gait velocity interpretation: <1.31 ft/sec, indicative of household ambulator General Gait Details: Very slow and guarded with 2 person HHA provided as there was no walker in the room. Pt ambulated to the bathroom and back to the chair.   Stairs            Wheelchair Mobility    Modified Rankin (Stroke Patients Only) Modified Rankin (Stroke Patients Only) Pre-Morbid Rankin Score: No symptoms Modified Rankin: Moderately severe disability     Balance Overall balance assessment: Needs assistance Sitting-balance support: Feet supported;No upper extremity supported Sitting balance-Leahy Scale: Fair Sitting balance - Comments: Noted posterior lean with LE MMT   Standing balance support: During functional activity Standing balance-Leahy Scale: Poor Standing balance comment: leaning against the sink for support while washing hands                             Pertinent Vitals/Pain Pain Assessment: No/denies pain    Home Living Family/patient expects to be discharged to:: Private residence Living  Arrangements: Spouse/significant other;Children;Other relatives Available Help at Discharge: Family;Available 24 hours/day Type of Home: Mobile home Home Access: Stairs to enter Entrance Stairs-Rails: None Entrance Stairs-Number of Steps: 3 Home Layout: One level Home Equipment: Walker - 2  wheels;Bedside commode      Prior Function Level of Independence: Independent with assistive device(s)   Gait / Transfers Assistance Needed: Just started using RW prior to coming to the hospital  ADL's / Homemaking Assistance Needed: Drives, cooks/cleans  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     Hand Dominance   Dominant Hand: Right    Extremity/Trunk Assessment   Upper Extremity Assessment Upper Extremity Assessment: LUE deficits/detail;RUE deficits/detail RUE Deficits / Details: Noted tremor in R hand RUE Sensation: WNL (Per pt report) LUE Sensation: WNL (per pt report) LUE Coordination: decreased gross motor (finger to nose)    Lower Extremity Assessment Lower Extremity Assessment: Generalized weakness;RLE deficits/detail;LLE deficits/detail RLE Deficits / Details: 5/5 strength with MMT in quads, hamstrings, hip flexors, DF, however noted decreased muscular endurance with OOB mobility RLE Sensation: WNL (per pt report) LLE Deficits / Details: Mildly weaker than the R, 5/5 strength in quads, 4/5 strength in hamstrings, hip flexors, and DF. Also noted decreased muscular endurance with OOB mobility.  LLE Sensation: WNL (per pt report)    Cervical / Trunk Assessment Cervical / Trunk Assessment: Other exceptions Cervical / Trunk Exceptions: forward head posture with rounded shoulders  Communication   Communication: No difficulties  Cognition Arousal/Alertness: Awake/alert Behavior During Therapy: WFL for tasks assessed/performed Overall Cognitive Status: Impaired/Different from baseline Area of Impairment: Memory;Safety/judgement;Problem solving                     Memory: Decreased short-term memory   Safety/Judgement: Decreased awareness of safety   Problem Solving: Slow processing;Requires verbal cues        General Comments      Exercises     Assessment/Plan    PT Assessment Patient needs continued PT services  PT Problem List  Decreased strength;Decreased activity tolerance;Decreased balance;Decreased mobility;Decreased coordination;Decreased knowledge of use of DME;Decreased safety awareness;Decreased knowledge of precautions;Decreased cognition       PT Treatment Interventions Gait training;DME instruction;Functional mobility training;Stair training;Therapeutic activities;Therapeutic exercise;Neuromuscular re-education;Patient/family education;Cognitive remediation    PT Goals (Current goals can be found in the Care Plan section)  Acute Rehab PT Goals Patient Stated Goal: Get back to PLOF PT Goal Formulation: With patient Time For Goal Achievement: 02/26/20 Potential to Achieve Goals: Good    Frequency Min 3X/week   Barriers to discharge        Co-evaluation               AM-PAC PT "6 Clicks" Mobility  Outcome Measure Help needed turning from your back to your side while in a flat bed without using bedrails?: None Help needed moving from lying on your back to sitting on the side of a flat bed without using bedrails?: None Help needed moving to and from a bed to a chair (including a wheelchair)?: A Little Help needed standing up from a chair using your arms (e.g., wheelchair or bedside chair)?: A Little Help needed to walk in hospital room?: A Little Help needed climbing 3-5 steps with a railing? : A Little 6 Click Score: 20    End of Session Equipment Utilized During Treatment: Gait belt Activity Tolerance: Patient tolerated treatment well Patient left: in chair;with call bell/phone within reach;with chair alarm set Nurse Communication: Mobility status PT Visit Diagnosis:  Unsteadiness on feet (R26.81);Muscle weakness (generalized) (M62.81);Difficulty in walking, not elsewhere classified (R26.2)    Time: 3468-8737 PT Time Calculation (min) (ACUTE ONLY): 42 min   Charges:   PT Evaluation $PT Eval Moderate Complexity: 1 Mod          Rolinda Roan, PT, DPT Acute Rehabilitation  Services Pager: [phone] Office: [phone]   Thelma Comp 02/19/2020, 3:43 PM

## 2020-02-19 NOTE — TOC Initial Note (Addendum)
Transition of Care [facility]) - Initial/Assessment Note    Patient Details  Name: [name] MRN: [id number] Date of Birth: [birth year]  Transition of Care [facility]) CM/SW Contact:    Pollie Friar, RN Phone Number: 02/19/2020, 4:25 PM  Clinical Narrative:                 Recommendations are for [facility] services. [place] selected and [place] with [facility] accepted the referral. Pts daughter denies any issues with home medications or transportation. Pt will have 24 hour supervision at home. TOC following for further d/c needs.   Expected Discharge Plan: [place] Barriers to Discharge: Continued Medical Work up   Patient Goals and CMS Choice   CMS Medicare.gov Compare Post Acute Care list provided to:: Patient Choice offered to / list presented to : Patient  Expected Discharge Plan and Services Expected Discharge Plan: [place]   Discharge Planning Services: CM Consult Post Acute Care Choice: [place] arrangements for the past 2 months: Single Family Home                           HH Arranged: PT, OT HH Agency: [facility] Date [place]: 02/19/20   Representative spoke with at [place]: Dian Situ  Prior Living Arrangements/Services Living arrangements for the past 2 months: [place]   Patient language and need for interpreter reviewed:: Yes Do you feel safe going back to the place where you live?: Yes      Need for Family Participation in Patient Care: Yes (Comment)     Criminal Activity/Legal Involvement Pertinent to Current Situation/Hospitalization: No - Comment as needed  Activities of Daily Living      Permission Sought/Granted                  Emotional Assessment           Psych Involvement: No (comment)  Admission diagnosis:  TIA (transient ischemic attack) [G45.9] AKI (acute kidney injury) ([facility]) [N17.9] Altered mental status, unspecified altered mental status type  [R41.82] COVID-19 virus infection [U07.1] Patient Active Problem List   Diagnosis Date Noted   COVID-19 virus infection 02/17/2020   TIA (transient ischemic attack) 02/17/2020   AKI (acute kidney injury) ([place])    Chronic diastolic CHF (congestive heart failure) ([place]) 07/14/2017   Iron deficiency anemia 10/11/2015   Anemia 12/21/2014   SOB (shortness of breath) 12/03/2014   Graves disease 07/14/2013   Hyperlipidemia 07/14/2013   Diabetes mellitus type 2, noninsulin dependent ([place]) 07/14/2013   Hypertension 07/14/2013   GERD (gastroesophageal reflux disease) 07/14/2013   CAD (coronary artery disease)    Obesity (BMI 30-39.9)    PCP:  Carol Ada, MD Pharmacy:   [place], [address], [address] [place], [place] [address] Phone: [phone] Fax: [phone]  [id number] [place], [place] [address]  [place] [address] Phone: [phone] Fax: [phone]     Social Determinants of Health (SDOH) Interventions    Readmission Risk Interventions No flowsheet data found.

## 2020-02-20 LAB — CBC WITH DIFFERENTIAL/PLATELET
Abs Immature Granulocytes: 0.05 10*3/uL (ref 0.00–0.07)
Basophils Absolute: 0 10*3/uL (ref 0.0–0.1)
Basophils Relative: 0 %
Eosinophils Absolute: 0 10*3/uL (ref 0.0–0.5)
Eosinophils Relative: 0 %
HCT: 38.2 % (ref 36.0–46.0)
Hemoglobin: 13.2 g/dL (ref 12.0–15.0)
Immature Granulocytes: 1 %
Lymphocytes Relative: 9 %
Lymphs Abs: 0.6 10*3/uL — ABNORMAL LOW (ref 0.7–4.0)
MCH: 30.7 pg (ref 26.0–34.0)
MCHC: 34.6 g/dL (ref 30.0–36.0)
MCV: 88.8 fL (ref 80.0–100.0)
Monocytes Absolute: 0.3 10*3/uL (ref 0.1–1.0)
Monocytes Relative: 4 %
Neutro Abs: 6.5 10*3/uL (ref 1.7–7.7)
Neutrophils Relative %: 86 %
Platelets: 258 10*3/uL (ref 150–400)
RBC: 4.3 MIL/uL (ref 3.87–5.11)
RDW: 12.8 % (ref 11.5–15.5)
WBC: 7.5 10*3/uL (ref 4.0–10.5)
nRBC: 0 % (ref 0.0–0.2)

## 2020-02-20 LAB — COMPREHENSIVE METABOLIC PANEL
ALT: 34 U/L (ref 0–44)
AST: 34 U/L (ref 15–41)
Albumin: 2.7 g/dL — ABNORMAL LOW (ref 3.5–5.0)
Alkaline Phosphatase: 75 U/L (ref 38–126)
Anion gap: 12 (ref 5–15)
BUN: 38 mg/dL — ABNORMAL HIGH (ref 8–23)
CO2: 22 mmol/L (ref 22–32)
Calcium: 9.2 mg/dL (ref 8.9–10.3)
Chloride: 106 mmol/L (ref 98–111)
Creatinine, Ser: 1.46 mg/dL — ABNORMAL HIGH (ref 0.44–1.00)
GFR calc Af Amer: 42 mL/min — ABNORMAL LOW (ref >60)
GFR calc non Af Amer: 36 mL/min — ABNORMAL LOW (ref >60)
Glucose, Bld: 131 mg/dL — ABNORMAL HIGH (ref 70–99)
Potassium: 3.3 mmol/L — ABNORMAL LOW (ref 3.5–5.1)
Sodium: 140 mmol/L (ref 135–145)
Total Bilirubin: 0.5 mg/dL (ref 0.3–1.2)
Total Protein: 5.8 g/dL — ABNORMAL LOW (ref 6.5–8.1)

## 2020-02-20 LAB — C-REACTIVE PROTEIN: CRP: 4.3 mg/dL — ABNORMAL HIGH (ref <1.0)

## 2020-02-20 LAB — GLUCOSE, CAPILLARY
Glucose-Capillary: 146 mg/dL — ABNORMAL HIGH (ref 70–99)
Glucose-Capillary: 140 mg/dL — ABNORMAL HIGH (ref 70–99)
Glucose-Capillary: 120 mg/dL — ABNORMAL HIGH (ref 70–99)
Glucose-Capillary: 259 mg/dL — ABNORMAL HIGH (ref 70–99)

## 2020-02-20 LAB — MAGNESIUM: Magnesium: 1.6 mg/dL — ABNORMAL LOW (ref 1.7–2.4)

## 2020-02-20 LAB — PHOSPHORUS: Phosphorus: 2.7 mg/dL (ref 2.5–4.6)

## 2020-02-20 LAB — FERRITIN: Ferritin: 924 ng/mL — ABNORMAL HIGH (ref 11–307)

## 2020-02-20 LAB — D-DIMER, QUANTITATIVE: D-Dimer, Quant: 0.75 ug/mL-FEU — ABNORMAL HIGH (ref 0.00–0.50)

## 2020-02-20 MED ORDER — POTASSIUM CHLORIDE CRYS ER 20 MEQ PO TBCR
40.0000 meq | EXTENDED_RELEASE_TABLET | Freq: Two times a day (BID) | ORAL | Status: DC
  Administered 2020-02-20 (×2): 40 meq via ORAL
  Filled 2020-02-20 (×2): qty 2

## 2020-02-20 MED ORDER — MAGNESIUM SULFATE 2 GM/50ML IV SOLN
2.0000 g | Freq: Once | INTRAVENOUS | Status: AC
  Administered 2020-02-20: 2 g via INTRAVENOUS
  Filled 2020-02-20: qty 50

## 2020-02-20 NOTE — Progress Notes (Addendum)
Physical Therapy Treatment Patient Details Name: [name] MRN: [id number] DOB: [birth year] Today's Date: 02/20/2020    History of Present Illness Pt is a 69 y/o female with PMH of chronic iron deficiency anemia, hypothyroidism, CAD s/p stents x6, HTN, DM on oral hypoglycemics, hyperlipidemia presented to the emergency room with cough and shortness of breath, generalized weakness with recently diagnosed Covid 19 infection as well as weakness of the body left more than right.  At the ED, code stroke was triggered.  CT head, MRI negative for acute stroke; Pt is a 69 y/o female with PMH of chronic iron deficiency anemia, hypothyroidism, CAD s/p stents x6, HTN, DM on oral hypoglycemics, hyperlipidemia presented to the emergency room with cough and shortness of breath, generalized weakness with recently diagnosed Covid 19 infection as well as weakness of the body left more than right.  At the ED, code stroke was triggered.  CT head, MRI negative for acute stroke, but showed chronic microvascular ischemic changes.  Small chronic Rt thalamic infarct.  Chest x-ray showed bilateral multifocal infiltrates compatible with COVID-19 infection. .    PT Comments    Pt sitting up in chair on arrival to room. She is very fatigued and requesting to return to bed but is agreeable to participate with therapy first. Pt ambulated to and from the restroom, where she performed her own peri care and was able to rise from low toilet seat. Pt frequently stopping mid task and takes a few seconds to begin again, unsure if this is from fatigue or cognitive in nature. SpO2 dropped to 84% at end of session on RA. Quickly returned to normal with seated rest break and cues for pursed lipped breathing. It appears that pt does have 24/7 assist from daughter. Pt will need to negotiate 3 steps w/o handrails to enter her home. As of today, this PTA feels pt would not be able to safely negotiate steps, she may need non emergent transport  to return home. Will continue to follow acutely for mobility progression.     Follow Up Recommendations  Home health PT (Covid Cares Program if eligible)     Equipment Recommendations  None recommended by PT    Recommendations for Other Services       Precautions / Restrictions Precautions Precautions: Fall    Mobility  Bed Mobility Overal bed mobility: Needs Assistance Bed Mobility: Sit to Sidelying         Sit to sidelying: Supervision General bed mobility comments: Assist for line management. Pt able to progress from EOB to sidelaying w/o assist.   Transfers Overall transfer level: Needs assistance Equipment used: Rolling walker (2 wheeled) Transfers: Sit to/from Stand Sit to Stand: Min guard         General transfer comment: min guard for safety and assist for line management. VC for hand placement with RW  Ambulation/Gait Ambulation/Gait assistance: Min assist;Min guard Gait Distance (Feet): 15 Feet (x2 to and from the bathroom) Assistive device: Rolling walker (2 wheeled) Gait Pattern/deviations: Shuffle;Trunk flexed Gait velocity: Decreased Gait velocity interpretation: <1.31 ft/sec, indicative of household ambulator General Gait Details: Very slow guarded gait. Mostly min guard, min A given when progressing to sink, pt attempting to leave RW behind and having difficulty understanding cues to bring RW all the way to the sink.   Stairs             Wheelchair Mobility    Modified Rankin (Stroke Patients Only) Modified Rankin (Stroke Patients Only) Pre-Morbid Rankin Score:  No symptoms Modified Rankin: Moderately severe disability     Balance Overall balance assessment: Needs assistance Sitting-balance support: Feet supported;No upper extremity supported Sitting balance-Leahy Scale: Fair     Standing balance support: During functional activity Standing balance-Leahy Scale: Poor Standing balance comment: leaning against the sink for support  while washing hands and requires up to min A due to LOB                             Cognition Arousal/Alertness: Awake/alert Behavior During Therapy: Flat affect Overall Cognitive Status: Impaired/Different from baseline Area of Impairment: Attention;Memory;Following commands;Safety/judgement;Awareness;Problem solving                   Current Attention Level: Sustained (with mod cues ) Memory: Decreased short-term memory Following Commands: Follows one step commands consistently;Follows one step commands with increased time Safety/Judgement: Decreased awareness of deficits   Problem Solving: Slow processing;Decreased initiation;Difficulty sequencing;Requires verbal cues General Comments: Pt with flat affect. Frequent pauses during activities, unsure if this is from fatigue or poor attention. Slow processing.       Exercises General Exercises - Upper Extremity Shoulder Horizontal ABduction: Strengthening;5 reps;Seated;Theraband Theraband Level (Shoulder Horizontal Abduction): Level 1 (Yellow) (mod cues and 2 rest breaks ) Elbow Flexion: Strengthening;Right;Left;5 reps;Seated;Theraband Theraband Level (Elbow Flexion): Level 1 (Yellow) (mod cues and 3 rest breaks ) Other Exercises Other Exercises: Pt provdided with HEP for general strengthening using level 1 theraband.  Pt only able to complete 60% of the exercises at 5 reps with 2-3 rest breaks each exercise and mod cues to perform as she just stops mid exercise and stares.      General Comments General comments (skin integrity, edema, etc.): SpO2 dropped to 84% at end of session. Quickly returned  to >90 with seated break and cues for pursed lipped breathing      Pertinent Vitals/Pain Pain Assessment: No/denies pain    Home Living                      Prior Function            PT Goals (current goals can now be found in the care plan section) Acute Rehab PT Goals Patient Stated Goal: to go home!   PT Goal Formulation: With patient Time For Goal Achievement: 02/26/20 Potential to Achieve Goals: Good Progress towards PT goals: Progressing toward goals    Frequency    Min 3X/week      PT Plan Current plan remains appropriate    Co-evaluation              AM-PAC PT "6 Clicks" Mobility   Outcome Measure  Help needed turning from your back to your side while in a flat bed without using bedrails?: None Help needed moving from lying on your back to sitting on the side of a flat bed without using bedrails?: None Help needed moving to and from a bed to a chair (including a wheelchair)?: A Little Help needed standing up from a chair using your arms (e.g., wheelchair or bedside chair)?: A Little Help needed to walk in hospital room?: A Little Help needed climbing 3-5 steps with a railing? : A Lot 6 Click Score: 19    End of Session Equipment Utilized During Treatment: Gait belt Activity Tolerance: Patient limited by fatigue Patient left: with call bell/phone within reach;in bed Nurse Communication: Mobility status PT Visit Diagnosis: Unsteadiness on feet (R26.81);Muscle  weakness (generalized) (M62.81);Difficulty in walking, not elsewhere classified (R26.2)     Time: 5176-1607 PT Time Calculation (min) (ACUTE ONLY): 30 min  Charges:  $Gait Training: 8-22 mins $Therapeutic Activity: 8-22 mins                     Benjiman Core, [place] Pager [phone] Acute Rehab  Allena Katz 02/20/2020, 1:06 PM

## 2020-02-20 NOTE — Progress Notes (Signed)
PROGRESS NOTE    [name]  EOF:[id number] DOB: Feb 25, 1951 DOA: 02/17/2020 PCP: Carol Ada, MD    Brief Narrative:  69 year old female with history of chronic iron deficiency anemia, hypothyroidism, coronary artery disease status post stents x6, hypertension, diabetes on oral hypoglycemics, hyperlipidemia presented to the emergency room with cough and shortness of breath, generalized weakness with recently diagnosed Covid 19 infection as well as weakness of the body left more than right.  All her family was exposed to the infection.  [facility] and was positive.  She stayed home, however could not eat or drink anything.  Felt very weak.  Yesterday, her daughter noted that her left side was weak so EMS was called. At the emergency room, code stroke was triggered.  CT head, CTA of the head and neck showed no critical narrow blood vessels.  Chest x-ray showed bilateral multifocal infiltrates compatible with COVID-19 infection.  Creatinine was 2.4, elevated from baseline.  She was needing 1 or 2 L of oxygen. Admitted with acute stroke versus TIA, COVID-19 pneumonia.  Assessment & Plan:   Active Problems:   COVID-19 virus infection   TIA (transient ischemic attack)  Suspected right hemispheric stroke: Ischemic stroke.  Negative MRI. Clinical findings, generalized weakness.  Left upper extremity weakness more than the right. Currently resolved. CT head findings, normal. MRI of the brain, no evidence of acute stroke.  Chronic microvascular changes. CT of the head and neck, no major blockage. 2D echocardiogram, normal.  No source of embolism. Antiplatelet therapy, on aspirin at home, but not taking.  Started on baby aspirin. LDL 29.  On multiple regimen that we will continue. Hemoglobin A1c more than 8. DVT prophylaxis, heparin. Therapy recommendations, pending. Followed by neurology.  No new recommendations.  Continue to work with PT  OT.  Pneumonia due to COVID-19 virus: chest physiotherapy, incentive spirometry, deep breathing exercises, sputum induction, mucolytic's and bronchodilators. Supplemental oxygen to keep saturations more than 90%. Covid directed therapy with , steroids Solu-Medrol, 0.5 mg/kg/day remdesivir, day 4/5 antibiotics, not indicated Due to severity of symptoms, patient will need daily inflammatory markers,  liver function test to monitor and direct COVID-19 therapies. Patient is okay to get all therapeutics and treatments including experimental treatments for COVID-19 pneumonia, however she does not believe on COVID-19 vaccination.  Acute renal failure: Due to poor oral intake.  Continue maintenance IV fluids.  Monitor closely. Renal ultrasound was normal.  Type 2 diabetes: Poorly controlled. A1c 8.5.  On Metformin and amaryl at home.  Currently on sliding scale insulin.  Expected to hyperglycemia with use of steroids for COVID-19 pneumonia.  Increase long-acting insulin. Patient is followed by her primary care physician who is planning to put her on once weekly injection of clinical trial .   Hypertension: Blood pressures stable.  Holding lisinopril and other antihypertensives until clinical improvement.  COVID-19 Labs  Recent Labs    02/18/20 1037 02/19/20 0219 02/20/20 0259  DDIMER 1.16* 0.97* 0.75*  FERRITIN 914* 789* 924*  CRP 12.1* 7.9* 4.3*    Lab Results  Component Value Date   SARSCOV2NAA Not Detected 05/05/2019   SpO2: 92 % O2 Flow Rate (L/min): 0.5 L/min   DVT prophylaxis: heparin injection 5,000 Units Start: 02/17/20 1315   Code Status: Full code Family Communication: Daughter on the phone Disposition Plan: Status is: Inpatient  Remains inpatient appropriate because:IV treatments appropriate due to intensity of illness or inability to take PO and  Inpatient level of care appropriate due to severity of illness   Dispo: The patient is from: Home               Anticipated d/c is to: Home with home health.              Anticipated d/c date is: Architectural technologist.              Patient currently is not medically stable to d/c.  Patient is very debilitated , has enough support at home. Wants to go home with HHPT/OT instead of SNF.  Anticipate dc tomorrow if stable.   Consultants:   Neurology  Procedures:   None  Antimicrobials:  Antibiotics Given (last 72 hours)    Date/Time Action Medication Dose Rate   02/17/20 1442 New Bag/Given   remdesivir 200 mg in sodium chloride 0.9% 250 mL IVPB 200 mg 580 mL/hr   02/18/20 1030 New Bag/Given   remdesivir 100 mg in sodium chloride 0.9 % 100 mL IVPB 100 mg 200 mL/hr   02/19/20 1049 New Bag/Given   remdesivir 100 mg in sodium chloride 0.9 % 100 mL IVPB 100 mg 200 mL/hr   02/20/20 0844 New Bag/Given   remdesivir 100 mg in sodium chloride 0.9 % 100 mL IVPB 100 mg 200 mL/hr         Subjective: Seen and examined. Denies any complaints. Wants to go home. Debilitated and difficulty walking but daughter at home able to help .   Objective: Vitals:   02/20/20 0021 02/20/20 0601 02/20/20 0800 02/20/20 1153  BP: (!) 153/77 (!) 141/76 127/71 135/71  Pulse: 66 73 72 75  Resp: 17 18 19 20   Temp: 98.2 F (36.8 C) 98 F (36.7 C) 98.1 F (36.7 C) (!) 97.4 F (36.3 C)  TempSrc: Oral Oral Oral Oral  SpO2: 96% 94% 96% 92%  Weight:      Height:        Intake/Output Summary (Last 24 hours) at 02/20/2020 1301 Last data filed at 02/20/2020 1200 Gross per 24 hour  Intake 1237.74 ml  Output 2650 ml  Net -1412.26 ml   Filed Weights   02/17/20 1100 02/17/20 1141  Weight: 87.7 kg 87.7 kg    Examination:  General exam: Appears calm and comfortable  Not in any distress.  On room air. Respiratory system: Clear to auscultation. Respiratory effort normal.  No added sounds. Cardiovascular system: S1 & S2 heard, RRR. No JVD, murmurs, rubs, gallops or clicks. No pedal edema. Gastrointestinal system: Abdomen is  nondistended, soft and nontender. No organomegaly or masses felt. Normal bowel sounds heard.  Obese and pendulous. Central nervous system: Alert and oriented.  No cranial nerve deficits. She has equal motor power on all 4 extremities, no asymmetrical weakness noted today.   Data Reviewed: I have personally reviewed following labs and imaging studies  CBC: Recent Labs  Lab 02/17/20 1100 02/17/20 1101 02/18/20 1037 02/19/20 0219 02/20/20 0259  WBC 6.4  --  3.5* 5.0 7.5  NEUTROABS 5.5  --  3.2 4.1 6.5  HGB 11.9* 11.9* 10.8* 11.2* 13.2  HCT 37.0 35.0* 33.5* 33.5* 38.2  MCV 96.1  --  94.9 92.5 88.8  PLT 137*  --  132* 153 099   Basic Metabolic Panel: Recent Labs  Lab 02/17/20 1100 02/17/20 1101 02/18/20 1037 02/19/20 0219 02/20/20 0259  NA 136 136 134* 133* 140  K 3.8 4.0 3.9 3.5 3.3*  CL 104 103 106 106 106  CO2 18*  --  17* 16* 22  GLUCOSE 195* 187* 244* 377* 131*  BUN 35* 46* 45* 47* 38*  CREATININE 2.42* 2.50* 2.36* 2.06* 1.46*  CALCIUM 8.5*  --  8.1* 8.2* 9.2  MG  --   --  1.4* 2.1 1.6*  PHOS  --   --  4.5 3.5 2.7   GFR: Estimated Creatinine Clearance: 34.9 mL/min (A) (by C-G formula based on SCr of 1.46 mg/dL (H)). Liver Function Tests: Recent Labs  Lab 02/17/20 1100 02/18/20 1037 02/19/20 0219 02/20/20 0259  AST 44* 35 36 34  ALT 41 35 34 34  ALKPHOS 77 64 70 75  BILITOT 0.6 0.7 0.3 0.5  PROT 5.6* 5.3* 5.3* 5.8*  ALBUMIN 2.9* 2.5* 2.4* 2.7*   No results for input(s): LIPASE, AMYLASE in the last 168 hours. No results for input(s): AMMONIA in the last 168 hours. Coagulation Profile: Recent Labs  Lab 02/17/20 1100  INR 1.0   Cardiac Enzymes: No results for input(s): CKTOTAL, CKMB, CKMBINDEX, TROPONINI in the last 168 hours. BNP (last 3 results) No results for input(s): PROBNP in the last 8760 hours. HbA1C: Recent Labs    02/18/20 1037  HGBA1C 8.5*   CBG: Recent Labs  Lab 02/19/20 1225 02/19/20 1616 02/19/20 2147 02/20/20 0600  02/20/20 1150  GLUCAP 299* 214* 202* 146* 140*   Lipid Profile: Recent Labs    02/18/20 1037  CHOL 78  HDL 29*  LDLCALC 29  TRIG 102  CHOLHDL 2.7   Thyroid Function Tests: No results for input(s): TSH, T4TOTAL, FREET4, T3FREE, THYROIDAB in the last 72 hours. Anemia Panel: Recent Labs    02/19/20 0219 02/20/20 0259  FERRITIN 789* 924*   Sepsis Labs: Recent Labs  Lab 02/17/20 1220 02/17/20 1657  PROCALCITON 0.14  --   LATICACIDVEN 2.9* 2.2*    Recent Results (from the past 240 hour(s))  Blood Culture (routine x 2)     Status: None (Preliminary result)   Collection Time: 02/17/20 12:20 PM   Specimen: BLOOD  Result Value Ref Range Status   Specimen Description BLOOD RIGHT ANTECUBITAL  Final   Special Requests   Final    BOTTLES DRAWN AEROBIC ONLY Blood Culture adequate volume   Culture   Final    NO GROWTH 2 DAYS Performed at [facility], [place] [address] [place], [place] [address]    Report Status PENDING  Incomplete  Blood Culture (routine x 2)     Status: None (Preliminary result)   Collection Time: 02/17/20 12:31 PM   Specimen: BLOOD  Result Value Ref Range Status   Specimen Description BLOOD RIGHT ANTECUBITAL  Final   Special Requests   Final    BOTTLES DRAWN AEROBIC AND ANAEROBIC Blood Culture adequate volume   Culture   Final    NO GROWTH 2 DAYS Performed at [facility], [place] [address] [place], [place] [address]    Report Status PENDING  Incomplete         Radiology Studies: ECHOCARDIOGRAM COMPLETE  Result Date: 02/18/2020    ECHOCARDIOGRAM REPORT   Patient Name:   Adryanna Linnell Fulling Date of Exam: 02/18/2020 Medical Rec #:  [id number]        Height:       59.5 in Accession #:    [id number]       Weight:       193.3 lb Date of Birth:  1951-01-07         BSA:  1.829 m Patient Age:    53 years         BP:           133/72 mmHg Patient Gender: F                HR:           87 bpm. Exam Location:  Inpatient Procedure: 2D Echo  Indications:    435.9 TIA  History:        Patient has prior history of Echocardiogram examinations, most                 recent 01/29/2019. CAD; Risk Factors:Hypertension, Diabetes and                 Dyslipidemia. Covid +.  Sonographer:    Jannett Celestine RDCS (AE) Referring Phys: [id number] Lequita Halt  Sonographer Comments: Patient is morbidly obese. Image acquisition challenging due to patient body habitus. see comments IMPRESSIONS  1. Left ventricular ejection fraction, by estimation, is 55 to 60%. The left ventricle has normal function. The left ventricle has no regional wall motion abnormalities. Left ventricular diastolic parameters were normal.  2. Right ventricular systolic function is low normal. The right ventricular size is mildly enlarged.  3. The mitral valve is normal in structure. No evidence of mitral valve regurgitation.  4. The aortic valve is grossly normal. Aortic valve regurgitation is not visualized. Comparison(s): The left ventricular function is unchanged. FINDINGS  Left Ventricle: Left ventricular ejection fraction, by estimation, is 55 to 60%. The left ventricle has normal function. The left ventricle has no regional wall motion abnormalities. The left ventricular internal cavity size was normal in size. There is  no left ventricular hypertrophy. Left ventricular diastolic parameters were normal. Right Ventricle: The right ventricular size is mildly enlarged. Right vetricular wall thickness was not assessed. Right ventricular systolic function is low normal. Left Atrium: Left atrial size was normal in size. Right Atrium: Right atrial size was normal in size. Pericardium: There is no evidence of pericardial effusion. Mitral Valve: The mitral valve is normal in structure. No evidence of mitral valve regurgitation. Tricuspid Valve: The tricuspid valve is normal in structure. Tricuspid valve regurgitation is trivial. Aortic Valve: The aortic valve is grossly normal. Aortic valve regurgitation is  not visualized. Pulmonic Valve: The pulmonic valve was grossly normal. Pulmonic valve regurgitation is not visualized. Aorta: The aortic root and ascending aorta are structurally normal, with no evidence of dilitation. IAS/Shunts: The interatrial septum was not assessed.  LEFT VENTRICLE PLAX 2D LVIDd:         3.40 cm  Diastology LVIDs:         2.50 cm  LV e' lateral:   9.68 cm/s LV PW:         0.90 cm  LV E/e' lateral: 10.6 LV IVS:        0.90 cm  LV e' medial:    6.96 cm/s LVOT diam:     2.10 cm  LV E/e' medial:  14.8 LV SV:         66 LV SV Index:   36 LVOT Area:     3.46 cm  RIGHT VENTRICLE TAPSE (M-mode): 1.0 cm LEFT ATRIUM             Index       RIGHT ATRIUM           Index LA diam:        3.20 cm 1.75 cm/m  RA Area:     13.00 cm LA Vol (A2C):   41.3 ml 22.59 ml/m RA Volume:   27.70 ml  15.15 ml/m LA Vol (A4C):   41.6 ml 22.75 ml/m LA Biplane Vol: 42.0 ml 22.97 ml/m  AORTIC VALVE LVOT Vmax:   80.50 cm/s LVOT Vmean:  56.000 cm/s LVOT VTI:    0.191 m  AORTA Ao Root diam: 3.30 cm MITRAL VALVE MV Area (PHT): 2.99 cm     SHUNTS MV Decel Time: 254 msec     Systemic VTI:  0.19 m MV E velocity: 103.00 cm/s  Systemic Diam: 2.10 cm MV A velocity: 84.40 cm/s MV E/A ratio:  1.22 Dorris Carnes MD Electronically signed by Dorris Carnes MD Signature Date/Time: 02/18/2020/5:29:51 PM    Final         Scheduled Meds: . aspirin EC  81 mg Oral Daily  . atorvastatin  80 mg Oral Daily  . ezetimibe  10 mg Oral Daily  . fenofibrate  54 mg Oral Daily  . heparin  5,000 Units Subcutaneous Q12H  . insulin aspart  0-5 Units Subcutaneous QHS  . insulin aspart  0-9 Units Subcutaneous TID WC  . insulin aspart  5 Units Subcutaneous TID WC  . insulin detemir  16 Units Subcutaneous BID  . Ipratropium-Albuterol  1 puff Inhalation Q6H  . methylPREDNISolone (SOLU-MEDROL) injection  0.5 mg/kg Intravenous Q12H  . metoprolol tartrate  25 mg Oral BID  . sodium chloride flush  3 mL Intravenous Once   Continuous Infusions: .  magnesium sulfate bolus IVPB 2 g (02/20/20 1227)  . remdesivir 100 mg in NS 100 mL 100 mg (02/20/20 0844)     LOS: 3 days    Time spent: 30 minutes    Barb Merino, MD [facility] Pager [phone]

## 2020-02-20 NOTE — Progress Notes (Signed)
Occupational Therapy Treatment Patient Details Name: [name] MRN: [id number] DOB: 1951-01-14 Today's Date: 02/20/2020    History of present illness Pt is a 69 y/o female with PMH of chronic iron deficiency anemia, hypothyroidism, CAD s/p stents x6, HTN, DM on oral hypoglycemics, hyperlipidemia presented to the emergency room with cough and shortness of breath, generalized weakness with recently diagnosed Covid 19 infection as well as weakness of the body left more than right.  At the ED, code stroke was triggered.  CT head, MRI negative for acute stroke; Pt is a 69 y/o female with PMH of chronic iron deficiency anemia, hypothyroidism, CAD s/p stents x6, HTN, DM on oral hypoglycemics, hyperlipidemia presented to the emergency room with cough and shortness of breath, generalized weakness with recently diagnosed Covid 19 infection as well as weakness of the body left more than right.  At the ED, code stroke was triggered.  CT head, MRI negative for acute stroke, but showed chronic microvascular ischemic changes.  Small chronic Rt thalamic infarct.  Chest x-ray showed bilateral multifocal infiltrates compatible with COVID-19 infection. .   OT comments  Pt with decreased activity tolerance today citing fatigue.  She was provided with HEP for generalized strengthening, but could only tolerate 5 reps of 5 of the exercises and required 2-3 rest breaks per exercise.  She deferred ADL activity due to fatigue.  Pt with very flat affect and little initiation of conversation, and demonstrates delayed responses.  Reinforced with her that she will need to have direct assist if she attempts to stand or walk at home, to take a sponge bath initially as she likely doesn't have the endurance to shower.  She insists she will have adequate assist at discharge, however, RN reports pt's spouse is in the ED.   IF daughter able to provide adequate assistance, then pt may benefit from use of w/c at home as I am doubtful she  will have the endurance to walk to and from the bathroom or bedrooms throughout the day. PT to see later.    Follow Up Recommendations  Home health OT;Supervision/Assistance - 24 hour;SNF    Equipment Recommendations  Wheelchair.    Recommendations for Other Services      Precautions / Restrictions Precautions Precautions: Fall Restrictions Weight Bearing Restrictions: No       Mobility Bed Mobility               General bed mobility comments: pt up in chair   Transfers                 General transfer comment: pt deferred transfer due to fatigue     Balance                                           ADL either performed or assessed with clinical judgement   ADL                                         General ADL Comments: pt deferred ADLs due to fatigue      Vision       Perception     Praxis      Cognition Arousal/Alertness: Awake/alert Behavior During Therapy: Flat affect Overall Cognitive Status: Impaired/Different from baseline Area of Impairment: Attention;Memory;Following  commands;Safety/judgement;Awareness;Problem solving                   Current Attention Level: Sustained (with mod cues ) Memory: Decreased short-term memory Following Commands: Follows one step commands consistently;Follows one step commands with increased time Safety/Judgement: Decreased awareness of deficits   Problem Solving: Slow processing;Decreased initiation;Difficulty sequencing;Requires verbal cues General Comments: Pt with very flat affect today. She does not initiate much conversationally.   She required mod verbal cues to perform simple Home exercise program as she would start then stop after 2-3 repetitions.  When asked if she had performed the 10 reps instructed she said "yes".  She reports feeling very fatigued and in a fog.  when asked if her husband is definitely going to be able to be with her at all times when  up, she stated he would be.  However, in speaking with pt's nurse, pt's spouse is currently in the ED awaiting a bed due to COVID         Exercises Exercises: General Upper Extremity;Other exercises General Exercises - Upper Extremity Shoulder Horizontal ABduction: Strengthening;5 reps;Seated;Theraband Theraband Level (Shoulder Horizontal Abduction): Level 1 (Yellow) (mod cues and 2 rest breaks ) Elbow Flexion: Strengthening;Right;Left;5 reps;Seated;Theraband Theraband Level (Elbow Flexion): Level 1 (Yellow) (mod cues and 3 rest breaks ) Other Exercises Other Exercises: Pt provdided with HEP for general strengthening using level 1 theraband.  Pt only able to complete 60% of the exercises at 5 reps with 2-3 rest breaks each exercise and mod cues to perform as she just stops mid exercise and stares.     Shoulder Instructions       General Comments      Pertinent Vitals/ Pain       Pain Assessment: No/denies pain  Home Living                                          Prior Functioning/Environment              Frequency  Min 2X/week        Progress Toward Goals  OT Goals(current goals can now be found in the care plan section)  Progress towards OT goals: Progressing toward goals     Plan Discharge plan remains appropriate (but may need to be updated if )    Co-evaluation                 AM-PAC OT "6 Clicks" Daily Activity     Outcome Measure   Help from another person eating meals?: None Help from another person taking care of personal grooming?: A Little Help from another person toileting, which includes using toliet, bedpan, or urinal?: A Little Help from another person bathing (including washing, rinsing, drying)?: A Little Help from another person to put on and taking off regular upper body clothing?: A Little Help from another person to put on and taking off regular lower body clothing?: A Little 6 Click Score: 19    End of Session     OT Visit Diagnosis: Unsteadiness on feet (R26.81);Muscle weakness (generalized) (M62.81)   Activity Tolerance Patient limited by fatigue   Patient Left in chair;with call bell/phone within reach;with chair alarm set   Nurse Communication Mobility status        Time: 4174-0814 OT Time Calculation (min): 19 min  Charges: OT General Charges $OT Visit: 1 Visit OT Treatments $  Therapeutic Exercise: 8-22 mins  Nilsa Nutting., OTR/L Acute Rehabilitation Services Pager [phone] Office [place], [place] 02/20/2020, 11:35 AM

## 2020-02-21 LAB — COMPREHENSIVE METABOLIC PANEL
ALT: 32 U/L (ref 0–44)
AST: 30 U/L (ref 15–41)
Albumin: 2.7 g/dL — ABNORMAL LOW (ref 3.5–5.0)
Alkaline Phosphatase: 80 U/L (ref 38–126)
Anion gap: 12 (ref 5–15)
BUN: 36 mg/dL — ABNORMAL HIGH (ref 8–23)
CO2: 20 mmol/L — ABNORMAL LOW (ref 22–32)
Calcium: 8.8 mg/dL — ABNORMAL LOW (ref 8.9–10.3)
Chloride: 105 mmol/L (ref 98–111)
Creatinine, Ser: 1.26 mg/dL — ABNORMAL HIGH (ref 0.44–1.00)
GFR calc Af Amer: 50 mL/min — ABNORMAL LOW (ref >60)
GFR calc non Af Amer: 43 mL/min — ABNORMAL LOW (ref >60)
Glucose, Bld: 226 mg/dL — ABNORMAL HIGH (ref 70–99)
Potassium: 4.1 mmol/L (ref 3.5–5.1)
Sodium: 137 mmol/L (ref 135–145)
Total Bilirubin: 0.6 mg/dL (ref 0.3–1.2)
Total Protein: 5.5 g/dL — ABNORMAL LOW (ref 6.5–8.1)

## 2020-02-21 LAB — MAGNESIUM: Magnesium: 1.9 mg/dL (ref 1.7–2.4)

## 2020-02-21 LAB — GLUCOSE, CAPILLARY
Glucose-Capillary: 323 mg/dL — ABNORMAL HIGH (ref 70–99)
Glucose-Capillary: 148 mg/dL — ABNORMAL HIGH (ref 70–99)
Glucose-Capillary: 160 mg/dL — ABNORMAL HIGH (ref 70–99)
Glucose-Capillary: 121 mg/dL — ABNORMAL HIGH (ref 70–99)

## 2020-02-21 MED ORDER — INSULIN LISPRO (1 UNIT DIAL) 100 UNIT/ML (KWIKPEN): 2.0000 [IU] | PEN_INJECTOR | Freq: Three times a day (TID) | SUBCUTANEOUS | 11 refills | Status: DC

## 2020-02-21 MED ORDER — DEXAMETHASONE 4 MG PO TABS: 4.0000 mg | ORAL_TABLET | Freq: Every day | ORAL | 0 refills | Status: AC

## 2020-02-21 NOTE — Progress Notes (Signed)
Occupational Therapy Treatment Patient Details Name: [name] MRN: [id number] DOB: 07-Feb-1951 Today's Date: 02/21/2020    History of present illness Pt is a 69 y/o female with PMH of chronic iron deficiency anemia, hypothyroidism, CAD s/p stents x6, HTN, DM on oral hypoglycemics, hyperlipidemia presented to the emergency room with cough and shortness of breath, generalized weakness with recently diagnosed Covid 19 infection as well as weakness of the body left more than right.  At the ED, code stroke was triggered.  CT head, MRI negative for acute stroke; Pt is a 69 y/o female with PMH of chronic iron deficiency anemia, hypothyroidism, CAD s/p stents x6, HTN, DM on oral hypoglycemics, hyperlipidemia presented to the emergency room with cough and shortness of breath, generalized weakness with recently diagnosed Covid 19 infection as well as weakness of the body left more than right.  At the ED, code stroke was triggered.  CT head, MRI negative for acute stroke, but showed chronic microvascular ischemic changes.  Small chronic Rt thalamic infarct.  Chest x-ray showed bilateral multifocal infiltrates compatible with COVID-19 infection. .   OT comments  Pt much brighter and interactive today and demonstrates improved activity tolerance.  She demonstrates improved safety awareness, and reports she definitely has 24 hour assist at discharge.   Follow Up Recommendations  Home health OT;Supervision/Assistance - 24 hour;   Equipment Recommendations  None recommended by OT    Recommendations for Other Services      Precautions / Restrictions Precautions Precautions: Fall       Mobility Bed Mobility Overal bed mobility: Needs Assistance Bed Mobility: Supine to Sit     Supine to sit: Supervision        Transfers                      Balance Overall balance assessment: Needs assistance Sitting-balance support: Feet supported;No upper extremity supported Sitting  balance-Leahy Scale: Fair     Standing balance support: No upper extremity supported Standing balance-Leahy Scale: Fair Standing balance comment: able to maintain static standing with min guard assist and without UE support                            ADL either performed or assessed with clinical judgement   ADL Overall ADL's : Needs assistance/impaired Eating/Feeding: Modified independent;Sitting;Bed level   Grooming: Wash/dry hands;Oral care;Wash/dry face;Brushing hair;Min guard;Standing                               Functional mobility during ADLs: Min guard General ADL Comments: Pt reports she has ambulated in the room and to and from bathroom with nursging several times today      Vision       Perception     Praxis      Cognition Arousal/Alertness: Awake/alert Behavior During Therapy: Flat affect Overall Cognitive Status: Impaired/Different from baseline Area of Impairment: Attention                   Current Attention Level: Selective Memory: Decreased short-term memory Following Commands: Follows one step commands consistently     Problem Solving: Slow processing;Requires verbal cues General Comments: Pt continues with flat affect, but is beginning to show inflection at times. She is now able to answer questions in full sentences and engages in conversation with only slight delay.  Exercises Other Exercises Other Exercises: Pt eager to eat lunch, but reinforced need to peform HEP at least 3x/day and she verbalized understanding    Shoulder Instructions       General Comments reviewed safety at home, need for someone with her when ambulating and need to sit to shower.  She verbalizes understanding and reports her daughter and grandson will be providing 24 hour assist     Pertinent Vitals/ Pain       Pain Assessment: No/denies pain  Home Living                                          Prior  Functioning/Environment              Frequency  Min 2X/week        Progress Toward Goals  OT Goals(current goals can now be found in the care plan section)  Progress towards OT goals: Progressing toward goals     Plan Discharge plan remains appropriate;Equipment recommendations need to be updated    Co-evaluation                 AM-PAC OT "6 Clicks" Daily Activity     Outcome Measure   Help from another person eating meals?: None Help from another person taking care of personal grooming?: A Little Help from another person toileting, which includes using toliet, bedpan, or urinal?: A Little Help from another person bathing (including washing, rinsing, drying)?: A Little Help from another person to put on and taking off regular upper body clothing?: A Little Help from another person to put on and taking off regular lower body clothing?: A Little 6 Click Score: 19    End of Session    OT Visit Diagnosis: Unsteadiness on feet (R26.81)   Activity Tolerance Patient tolerated treatment well   Patient Left in chair;with call bell/phone within reach;with chair alarm set   Nurse Communication          Time: 3903-0092 OT Time Calculation (min): 22 min  Charges: OT General Charges $OT Visit: 1 Visit OT Treatments $Therapeutic Activity: 8-22 mins  Nilsa Nutting., OTR/L Acute Rehabilitation Services Pager [phone] Office [phone]    Lucille Passy M 02/21/2020, 2:01 PM

## 2020-02-21 NOTE — Plan of Care (Signed)
Adequate for discharge.

## 2020-02-21 NOTE — Discharge Summary (Signed)
Physician Discharge Summary  Mirah Nevins GBT:[id number] DOB: 1951/02/28 DOA: 02/17/2020  PCP: Carol Ada, MD  Admit date: 02/17/2020 Discharge date: 02/21/2020  Admitted From: Home Disposition: Home with home health services  Recommendations for Outpatient Follow-up:  1. Follow up with PCP in 1-2 weeks 2. Please obtain BMP/CBC in one week   Home Health: PT/OT Equipment/Devices: None  Discharge Condition: Stable CODE STATUS: Full code Diet recommendation: Low-salt and low-carb diet  Discharge summary: 69 year old female with history of chronic iron deficiency anemia, hypothyroidism, coronary artery disease status post stents x6, hypertension, diabetes on oral hypoglycemics, hyperlipidemia presented to the emergency room with cough and shortness of breath, generalized weakness with recently diagnosed Covid 19 infection as well as weakness of the body left more than right.  All her family was exposed to the infection.  They went to [facility] 4 days ago to have COVID-19 test done and was positive.  She stayed home, however could not eat or drink anything.  Felt very weak.  Yesterday, her daughter noted that her left side was weak so EMS was called. At the emergency room, code stroke was triggered.  CT head, CTA of the head and neck showed no critical narrow blood vessels.  Chest x-ray showed bilateral multifocal infiltrates compatible with COVID-19 infection.  Creatinine was 2.4, elevated from baseline.  She was needing 1 or 2 L of oxygen. Admitted with acute stroke versus TIA, COVID-19 pneumonia.  Pneumonia due to COVID-19 virus with hypoxia: Treated aggressively with antiviral therapy with remdesivir for 5 days, steroids, chest physiotherapy and other supportive treatment with improvement.  Has profound weakness but no other pulmonary symptoms.  Continue steroid for total 10 days of therapy.  Mobility and physical therapy at home.  Currently on room air.  Strongly encouraged to take  vaccination after clinical improvement.  Acute renal failure: Due to poor oral intake.  Improved and normalized.  TIA: Initially presented with left upper extremity hemiparesis and spontaneously improved.  MRI brain was negative.  CTA of the head and neck was normal.  2D echocardiogram was normal.  Supposed to be taking aspirin but was not currently taking.  Optimized on aspirin, multiple lipid-lowering medications and blood pressure medications.  She is poorly controlled on her diabetic regimen.  She is on Amaryl and Metformin that she will resume.  With current use of steroids her blood sugars are elevated, will gradually cut down the steroids.  Will discharge patient home with insulin coverage along with blood sugar check.  She is on some clinical trial for her diabetes, however under she is using steroids she will be covered with high-dose sliding scale insulin.  Hypertension: Blood pressure stable.  Holding lisinopril due to dehydration.  She can resume beta-blockers nitrates and Lasix.  Patient has done good clinical improvement.  She is on room air.  Minimal pulmonary symptoms.  Debilitated due to COVID-19 infection.  She will benefit with home health PT OT.  Skilled nursing facility was recommended, however she has enough support at home so wants to recover at home.  Discharge Diagnoses:  Active Problems:   COVID-19 virus infection   TIA (transient ischemic attack)    Discharge Instructions  Discharge Instructions    Call MD for:  difficulty breathing, headache or visual disturbances   Complete by: As directed    Call MD for:  persistant dizziness or light-headedness   Complete by: As directed    Call MD for:  temperature >100.4   Complete by: As directed  Diet - low sodium heart healthy   Complete by: As directed    Diet Carb Modified   Complete by: As directed    Increase activity slowly   Complete by: As directed    MyChart COVID-19 home monitoring program   Complete by:  Feb 21, 2020    Is the patient willing to use the [facility] for home monitoring?: Yes   Temperature monitoring   Complete by: Feb 21, 2020    After how many days would you like to receive a notification of this patient's flowsheet entries?: 1     Allergies as of 02/21/2020   No Known Allergies     Medication List    STOP taking these medications   HYDROcodone-acetaminophen 5-325 MG tablet Commonly known as: NORCO/VICODIN   lisinopril 5 MG tablet Commonly known as: ZESTRIL     TAKE these medications   acetaminophen 500 MG tablet Commonly known as: TYLENOL Take 2 tablets (1,000 mg total) by mouth every 6 (six) hours as needed for moderate pain or headache.   aspirin EC 81 MG tablet Take 1 tablet (81 mg total) by mouth daily.   atorvastatin 80 MG tablet Commonly known as: LIPITOR Take 80 mg by mouth daily.   dexamethasone 4 MG tablet Commonly known as: Decadron Take 1 tablet (4 mg total) by mouth daily for 6 days.   ezetimibe 10 MG tablet Commonly known as: ZETIA Take 1 tablet (10 mg total) by mouth daily.   fenofibrate 48 MG tablet Commonly known as: Tricor Take 1 tablet (48 mg total) by mouth daily.   furosemide 20 MG tablet Commonly known as: LASIX Take 20 mg by mouth daily as needed for fluid.   glimepiride 4 MG tablet Commonly known as: AMARYL Take 2 mg by mouth 2 (two) times daily.   insulin lispro 100 UNIT/ML KwikPen Commonly known as: HUMALOG Inject 0.02-0.15 mLs (2-15 Units total) into the skin 3 (three) times daily after meals. 70 to 120  No insulin, 121 to150 2 units, 151to200 4 units, 201 to 250 6 units, 251 to 300 8 units,  301 to 350 10, units 351 to 400 15 units, More than 400 use 15 units and notify your doctor.   isosorbide mononitrate 60 MG 24 hr tablet Commonly known as: IMDUR Take 1 tablet (60 mg total) by mouth daily.   metFORMIN 1000 MG tablet Commonly known as: GLUCOPHAGE TAKE 1 TABLET BY MOUTH 2 TIMES DAILY WITH A  MEAL. What changed: See the new instructions.   metoprolol tartrate 25 MG tablet Commonly known as: LOPRESSOR Take 1 tablet (25 mg total) by mouth 2 (two) times daily.       Follow-up Information    [place], [place] Follow up.   Specialty: Home Health Services Why: The home health agency will call you for the first home visit. Contact information: [place] [address] [phone]              No Known Allergies  Consultations:  Neurology   Procedures/Studies: MR BRAIN WO CONTRAST  Result Date: 02/18/2020 CLINICAL DATA:  Left-sided weakness, code stroke follow-up EXAM: MRI HEAD WITHOUT CONTRAST TECHNIQUE: Multiplanar, multiecho pulse sequences of the brain and surrounding structures were obtained without intravenous contrast. COMPARISON:  None. FINDINGS: Brain: There is no acute infarction or intracranial hemorrhage. There is no intracranial mass, mass effect, or edema. There is no hydrocephalus or extra-axial fluid collection. Prominence of the ventricles and sulci  reflects generalized parenchymal volume loss. Patchy foci of T2 hyperintensity in the supratentorial white matter are nonspecific but may reflect mild to moderate chronic microvascular ischemic changes. There is a chronic small vessel infarct of the right thalamus. Vascular: Major vessel flow voids at the skull base are preserved. Skull and upper cervical spine: Normal marrow signal is preserved. Sinuses/Orbits: Paranasal sinuses mucosal thickening. Orbits are unremarkable. Other: Sella is partially empty.  Mastoid air cells are clear. IMPRESSION: No evidence of recent infarction, hemorrhage, or mass. Chronic microvascular ischemic changes. Small chronic right thalamic infarct. Electronically Signed   By: Macy Mis M.D.   On: 02/18/2020 09:12   US RENAL  Result Date: 02/17/2020 CLINICAL DATA:  Acute renal insufficiency. EXAM: RENAL / URINARY TRACT ULTRASOUND COMPLETE  COMPARISON:  None. FINDINGS: Right Kidney: Renal measurements: 11.1 cm x 4.9 cm x 5.0 cm = volume: 143.2 mL. There is diffuse renal cortical thinning. Echogenicity within normal limits. No mass or hydronephrosis visualized. Left Kidney: Renal measurements: 9.0 cm x 4.8 cm x 5.2 cm = volume: 117.5 mL. There is diffuse renal cortical thinning. Echogenicity within normal limits. No mass or hydronephrosis visualized. Bladder: Appears normal for degree of bladder distention. Other: Of incidental note is the presence of diffusely increased echogenicity of the liver parenchyma. IMPRESSION: 1. Diffuse bilateral renal cortical thinning. 2. Fatty liver. Electronically Signed   By: Virgina Norfolk M.D.   On: 02/17/2020 16:41   DG Chest Portable 1 View  Result Date: 02/17/2020 CLINICAL DATA:  COVID-19 positive.  Altered mental status. EXAM: PORTABLE CHEST 1 VIEW COMPARISON:  11/06/2016 FINDINGS: The cardiomediastinal silhouette is within normal limits status post CABG. There are interstitial densities in both lungs as well as asymmetric patchy opacity in the left lung base. No sizable pleural effusion or pneumothorax is identified. No acute osseous abnormality is seen. IMPRESSION: Left greater than right lung opacities compatible with pneumonia. Electronically Signed   By: Logan Bores M.D.   On: 02/17/2020 11:56   ECHOCARDIOGRAM COMPLETE  Result Date: 02/18/2020    ECHOCARDIOGRAM REPORT   Patient Name:   [name] Date of Exam: 02/18/2020 Medical Rec #:  [id number]        Height:       59.5 in Accession #:    [id number]       Weight:       193.3 lb Date of Birth:  April 17, 1951         BSA:          1.829 m Patient Age:    70 years         BP:           133/72 mmHg Patient Gender: F                HR:           87 bpm. Exam Location:  Inpatient Procedure: 2D Echo Indications:    435.9 TIA  History:        Patient has prior history of Echocardiogram examinations, most                 recent 01/29/2019. CAD; Risk  Factors:Hypertension, Diabetes and                 Dyslipidemia. Covid +.  Sonographer:    Jannett Celestine RDCS (AE) Referring Phys: [id number] Lequita Halt  Sonographer Comments: Patient is morbidly obese. Image acquisition challenging due to patient body habitus. see comments IMPRESSIONS  1.  Left ventricular ejection fraction, by estimation, is 55 to 60%. The left ventricle has normal function. The left ventricle has no regional wall motion abnormalities. Left ventricular diastolic parameters were normal.  2. Right ventricular systolic function is low normal. The right ventricular size is mildly enlarged.  3. The mitral valve is normal in structure. No evidence of mitral valve regurgitation.  4. The aortic valve is grossly normal. Aortic valve regurgitation is not visualized. Comparison(s): The left ventricular function is unchanged. FINDINGS  Left Ventricle: Left ventricular ejection fraction, by estimation, is 55 to 60%. The left ventricle has normal function. The left ventricle has no regional wall motion abnormalities. The left ventricular internal cavity size was normal in size. There is  no left ventricular hypertrophy. Left ventricular diastolic parameters were normal. Right Ventricle: The right ventricular size is mildly enlarged. Right vetricular wall thickness was not assessed. Right ventricular systolic function is low normal. Left Atrium: Left atrial size was normal in size. Right Atrium: Right atrial size was normal in size. Pericardium: There is no evidence of pericardial effusion. Mitral Valve: The mitral valve is normal in structure. No evidence of mitral valve regurgitation. Tricuspid Valve: The tricuspid valve is normal in structure. Tricuspid valve regurgitation is trivial. Aortic Valve: The aortic valve is grossly normal. Aortic valve regurgitation is not visualized. Pulmonic Valve: The pulmonic valve was grossly normal. Pulmonic valve regurgitation is not visualized. Aorta: The aortic root and  ascending aorta are structurally normal, with no evidence of dilitation. IAS/Shunts: The interatrial septum was not assessed.  LEFT VENTRICLE PLAX 2D LVIDd:         3.40 cm  Diastology LVIDs:         2.50 cm  LV e' lateral:   9.68 cm/s LV PW:         0.90 cm  LV E/e' lateral: 10.6 LV IVS:        0.90 cm  LV e' medial:    6.96 cm/s LVOT diam:     2.10 cm  LV E/e' medial:  14.8 LV SV:         66 LV SV Index:   36 LVOT Area:     3.46 cm  RIGHT VENTRICLE TAPSE (M-mode): 1.0 cm LEFT ATRIUM             Index       RIGHT ATRIUM           Index LA diam:        3.20 cm 1.75 cm/m  RA Area:     13.00 cm LA Vol (A2C):   41.3 ml 22.59 ml/m RA Volume:   27.70 ml  15.15 ml/m LA Vol (A4C):   41.6 ml 22.75 ml/m LA Biplane Vol: 42.0 ml 22.97 ml/m  AORTIC VALVE LVOT Vmax:   80.50 cm/s LVOT Vmean:  56.000 cm/s LVOT VTI:    0.191 m  AORTA Ao Root diam: 3.30 cm MITRAL VALVE MV Area (PHT): 2.99 cm     SHUNTS MV Decel Time: 254 msec     Systemic VTI:  0.19 m MV E velocity: 103.00 cm/s  Systemic Diam: 2.10 cm MV A velocity: 84.40 cm/s MV E/A ratio:  1.22 Dorris Carnes MD Electronically signed by Dorris Carnes MD Signature Date/Time: 02/18/2020/5:29:51 PM    Final    CT HEAD CODE STROKE WO CONTRAST  Result Date: 02/17/2020 CLINICAL DATA:  Code stroke.  Acute neuro deficit. EXAM: CT HEAD WITHOUT CONTRAST TECHNIQUE: Contiguous axial images were obtained from the  base of the skull through the vertex without intravenous contrast. COMPARISON:  08/13/2015 FINDINGS: Brain: No evidence of acute infarction, hemorrhage, hydrocephalus, extra-axial collection or mass lesion/mass effect. Well-defined and low-density lacune at the right thalamus on coronal reformats, interval. Vascular: Atherosclerotic calcifications.  No hyperdense vessel. Skull: Normal. Negative for fracture or focal lesion. Sinuses/Orbits: No acute finding. Other: These results were communicated to Dr. Leonel Ramsay at [facility] 8/18/2021by text page via the [name] messaging system.  ASPECTS [name] Stroke Program Early CT Score) Not scored with this limited history. IMPRESSION: 1. No acute finding. 2. Remote lacunar infarct at the right thalamus. Electronically Signed   By: Monte Fantasia M.D.   On: 02/17/2020 11:12   CT ANGIO HEAD CODE STROKE  Result Date: 02/17/2020 CLINICAL DATA:  Transient left-sided weakness. EXAM: CT ANGIOGRAPHY HEAD AND NECK TECHNIQUE: Multidetector CT imaging of the head and neck was performed using the standard protocol during bolus administration of intravenous contrast. Multiplanar CT image reconstructions and MIPs were obtained to evaluate the vascular anatomy. Carotid stenosis measurements (when applicable) are obtained utilizing NASCET criteria, using the distal internal carotid diameter as the denominator. CONTRAST:  Dose is currently not known. COMPARISON:  Head CT from earlier today FINDINGS: CTA NECK FINDINGS Aortic arch: Atheromatous plaque.  CABG. Right carotid system: Calcified plaque at the bifurcation and proximal ICA without flow limiting stenosis or ulceration. Left carotid system: Mild calcified plaque at the bifurcation. No ulceration or stenosis. Vertebral arteries: No proximal subclavian stenosis. Relatively codominant vertebral arteries that are smooth and widely patent to the dura. Skeleton: Unremarkable Other neck: Nodular thyroid not well evaluated on this arterial study but there is asymmetric thickening of the right lobe to 3 cm. Nodular thyroid with noted on a 2010 ultrasound. Direct comparison is not possible. Upper chest: Patchy airspace disease in the upper lungs. There is history of COVID-19. Review of the MIP images confirms the above findings CTA HEAD FINDINGS Anterior circulation: Atheromatous plaque on the carotid siphons. No branch occlusion, beading, or aneurysm. Posterior circulation: Codominant vertebral arteries. The vertebral and basilar arteries are smooth and widely patent. Atheromatous irregularity of bilateral PCA with  moderate to advanced narrowings at the left P2 segment. Venous sinuses: Patent as permitted by contrast timing. Anatomic variants: None significant Review of the MIP images confirms the above findings IMPRESSION: 1. No emergent finding including large vessel occlusion. 2. No proximal flow limiting stenosis or embolic source. 3. These at least moderate left P2 segment stenosis. 4. Patchy bilateral pneumonia. 5. Nodular thyroid. Recommend thyroid US if appropriate for comorbidities. (Ref: J Am Coll Radiol. 2015 Feb;12(2): 143-50). Electronically Signed   By: Monte Fantasia M.D.   On: 02/17/2020 11:30   CT ANGIO NECK CODE STROKE  Result Date: 02/17/2020 CLINICAL DATA:  Transient left-sided weakness. EXAM: CT ANGIOGRAPHY HEAD AND NECK TECHNIQUE: Multidetector CT imaging of the head and neck was performed using the standard protocol during bolus administration of intravenous contrast. Multiplanar CT image reconstructions and MIPs were obtained to evaluate the vascular anatomy. Carotid stenosis measurements (when applicable) are obtained utilizing NASCET criteria, using the distal internal carotid diameter as the denominator. CONTRAST:  Dose is currently not known. COMPARISON:  Head CT from earlier today FINDINGS: CTA NECK FINDINGS Aortic arch: Atheromatous plaque.  CABG. Right carotid system: Calcified plaque at the bifurcation and proximal ICA without flow limiting stenosis or ulceration. Left carotid system: Mild calcified plaque at the bifurcation. No ulceration or stenosis. Vertebral arteries: No proximal subclavian stenosis. Relatively codominant vertebral  arteries that are smooth and widely patent to the dura. Skeleton: Unremarkable Other neck: Nodular thyroid not well evaluated on this arterial study but there is asymmetric thickening of the right lobe to 3 cm. Nodular thyroid with noted on a 2010 ultrasound. Direct comparison is not possible. Upper chest: Patchy airspace disease in the upper lungs. There is  history of COVID-19. Review of the MIP images confirms the above findings CTA HEAD FINDINGS Anterior circulation: Atheromatous plaque on the carotid siphons. No branch occlusion, beading, or aneurysm. Posterior circulation: Codominant vertebral arteries. The vertebral and basilar arteries are smooth and widely patent. Atheromatous irregularity of bilateral PCA with moderate to advanced narrowings at the left P2 segment. Venous sinuses: Patent as permitted by contrast timing. Anatomic variants: None significant Review of the MIP images confirms the above findings IMPRESSION: 1. No emergent finding including large vessel occlusion. 2. No proximal flow limiting stenosis or embolic source. 3. These at least moderate left P2 segment stenosis. 4. Patchy bilateral pneumonia. 5. Nodular thyroid. Recommend thyroid US if appropriate for comorbidities. (Ref: J Am Coll Radiol. 2015 Feb;12(2): 143-50). Electronically Signed   By: Monte Fantasia M.D.   On: 02/17/2020 11:30    (Echo, Carotid, EGD, Colonoscopy, ERCP)    Subjective: Patient seen and examined.  Eager to go home.  She thinks she can walk around and do her stuff.  She has enough support at home.  Has some dry cough but no shortness of breath.   Discharge Exam: Vitals:   02/21/20 0422 02/21/20 0852  BP: (!) 116/97 (!) 142/74  Pulse: 67 71  Resp: 16 18  Temp: 97.7 F (36.5 C) 97.7 F (36.5 C)  SpO2: 96% 92%   Vitals:   02/20/20 2319 02/21/20 0052 02/21/20 0422 02/21/20 0852  BP: (!) 153/71 114/68 (!) 116/97 (!) 142/74  Pulse: 66 70 67 71  Resp: 20  16 18   Temp: 97.7 F (36.5 C)  97.7 F (36.5 C) 97.7 F (36.5 C)  TempSrc: Oral  Oral Oral  SpO2: 96%  96% 92%  Weight:      Height:        General: Pt is alert, awake, not in acute distress Cardiovascular: RRR, S1/S2 +, no rubs, no gallops Respiratory: CTA bilaterally, no wheezing, no rhonchi Abdominal: Soft, NT, ND, bowel sounds + Extremities: no edema, no cyanosis No neurological  deficits.    The results of significant diagnostics from this hospitalization (including imaging, microbiology, ancillary and laboratory) are listed below for reference.     Microbiology: Recent Results (from the past 240 hour(s))  Blood Culture (routine x 2)     Status: None (Preliminary result)   Collection Time: 02/17/20 12:20 PM   Specimen: BLOOD  Result Value Ref Range Status   Specimen Description BLOOD RIGHT ANTECUBITAL  Final   Special Requests   Final    BOTTLES DRAWN AEROBIC ONLY Blood Culture adequate volume   Culture   Final    NO GROWTH 4 DAYS Performed at [name], [address], [place], [place] [address]    Report Status PENDING  Incomplete  Blood Culture (routine x 2)     Status: None (Preliminary result)   Collection Time: 02/17/20 12:31 PM   Specimen: BLOOD  Result Value Ref Range Status   Specimen Description BLOOD RIGHT ANTECUBITAL  Final   Special Requests   Final    BOTTLES DRAWN AEROBIC AND ANAEROBIC Blood Culture adequate volume   Culture   Final  NO GROWTH 4 DAYS Performed at [name], [place] [address], [place], [place] [address]    Report Status PENDING  Incomplete     Labs: BNP (last 3 results) No results for input(s): BNP in the last 8760 hours. Basic Metabolic Panel: Recent Labs  Lab 02/17/20 1100 02/17/20 1100 02/17/20 1101 02/18/20 1037 02/19/20 0219 02/20/20 0259 02/21/20 0216  NA 136   < > 136 134* 133* 140 137  K 3.8   < > 4.0 3.9 3.5 3.3* 4.1  CL 104   < > 103 106 106 106 105  CO2 18*  --   --  17* 16* 22 20*  GLUCOSE 195*   < > 187* 244* 377* 131* 226*  BUN 35*   < > 46* 45* 47* 38* 36*  CREATININE 2.42*   < > 2.50* 2.36* 2.06* 1.46* 1.26*  CALCIUM 8.5*  --   --  8.1* 8.2* 9.2 8.8*  MG  --   --   --  1.4* 2.1 1.6* 1.9  PHOS  --   --   --  4.5 3.5 2.7  --    < > = values in this interval not displayed.   Liver Function Tests: Recent Labs  Lab 02/17/20 1100 02/18/20 1037 02/19/20 0219  02/20/20 0259 02/21/20 0216  AST 44* 35 36 34 30  ALT 41 35 34 34 32  ALKPHOS 77 64 70 75 80  BILITOT 0.6 0.7 0.3 0.5 0.6  PROT 5.6* 5.3* 5.3* 5.8* 5.5*  ALBUMIN 2.9* 2.5* 2.4* 2.7* 2.7*   No results for input(s): LIPASE, AMYLASE in the last 168 hours. No results for input(s): AMMONIA in the last 168 hours. CBC: Recent Labs  Lab 02/17/20 1100 02/17/20 1101 02/18/20 1037 02/19/20 0219 02/20/20 0259  WBC 6.4  --  3.5* 5.0 7.5  NEUTROABS 5.5  --  3.2 4.1 6.5  HGB 11.9* 11.9* 10.8* 11.2* 13.2  HCT 37.0 35.0* 33.5* 33.5* 38.2  MCV 96.1  --  94.9 92.5 88.8  PLT 137*  --  132* 153 258   Cardiac Enzymes: No results for input(s): CKTOTAL, CKMB, CKMBINDEX, TROPONINI in the last 168 hours. BNP: Invalid input(s): POCBNP CBG: Recent Labs  Lab 02/20/20 1150 02/20/20 1632 02/20/20 2222 02/21/20 0614 02/21/20 0858  GLUCAP 140* 120* 259* 148* 160*   D-Dimer Recent Labs    02/19/20 0219 02/20/20 0259  DDIMER 0.97* 0.75*   Hgb A1c No results for input(s): HGBA1C in the last 72 hours. Lipid Profile No results for input(s): CHOL, HDL, LDLCALC, TRIG, CHOLHDL, LDLDIRECT in the last 72 hours. Thyroid function studies No results for input(s): TSH, T4TOTAL, T3FREE, THYROIDAB in the last 72 hours.  Invalid input(s): FREET3 Anemia work up Recent Labs    02/19/20 0219 02/20/20 0259  FERRITIN 789* 924*   Urinalysis    Component Value Date/Time   COLORURINE RED (A) 01/13/2012 1851   APPEARANCEUR CLOUDY (A) 01/13/2012 1851   LABSPEC 1.029 01/13/2012 1851   PHURINE 5.0 01/13/2012 1851   GLUCOSEU NEGATIVE 01/13/2012 1851   HGBUR NEGATIVE 01/13/2012 1851   BILIRUBINUR MODERATE (A) 01/13/2012 1851   KETONESUR 15 (A) 01/13/2012 1851   PROTEINUR 30 (A) 01/13/2012 1851   UROBILINOGEN 1.0 01/13/2012 1851   NITRITE POSITIVE (A) 01/13/2012 1851   LEUKOCYTESUR MODERATE (A) 01/13/2012 1851   Sepsis Labs Invalid input(s): PROCALCITONIN,  WBC,  LACTICIDVEN Microbiology Recent  Results (from the past 240 hour(s))  Blood Culture (routine x 2)  Status: None (Preliminary result)   Collection Time: 02/17/20 12:20 PM   Specimen: BLOOD  Result Value Ref Range Status   Specimen Description BLOOD RIGHT ANTECUBITAL  Final   Special Requests   Final    BOTTLES DRAWN AEROBIC ONLY Blood Culture adequate volume   Culture   Final    NO GROWTH 4 DAYS Performed at [name], [address] [place], [place] [address]    Report Status PENDING  Incomplete  Blood Culture (routine x 2)     Status: None (Preliminary result)   Collection Time: 02/17/20 12:31 PM   Specimen: BLOOD  Result Value Ref Range Status   Specimen Description BLOOD RIGHT ANTECUBITAL  Final   Special Requests   Final    BOTTLES DRAWN AEROBIC AND ANAEROBIC Blood Culture adequate volume   Culture   Final    NO GROWTH 4 DAYS Performed at [name], [place] [address], [place], [place] [address]    Report Status PENDING  Incomplete     Time coordinating discharge:  45 minutes  SIGNED:   Barb Merino, MD  [facility] 02/21/2020, 11:11 AM

## 2020-02-21 NOTE — Progress Notes (Signed)
Physical Therapy Treatment Patient Details Name: [name] MRN: [id number] DOB: 01/08/1951 Today's Date: 02/21/2020    History of Present Illness Pt is a 69 y/o female with PMH of chronic iron deficiency anemia, hypothyroidism, CAD s/p stents x6, HTN, DM on oral hypoglycemics, hyperlipidemia presented to the emergency room with cough and shortness of breath, generalized weakness with recently diagnosed Covid 19 infection as well as weakness of the body left more than right.  At the ED, code stroke was triggered.  CT head, MRI negative for acute stroke; Pt is a 69 y/o female with PMH of chronic iron deficiency anemia, hypothyroidism, CAD s/p stents x6, HTN, DM on oral hypoglycemics, hyperlipidemia presented to the emergency room with cough and shortness of breath, generalized weakness with recently diagnosed Covid 19 infection as well as weakness of the body left more than right.  At the ED, code stroke was triggered.  CT head, MRI negative for acute stroke, but showed chronic microvascular ischemic changes.  Small chronic Rt thalamic infarct.  Chest x-ray showed bilateral multifocal infiltrates compatible with COVID-19 infection. .    PT Comments    Continuing work on functional mobility and activity tolerance;  Pt much brighter and interactive today and demonstrates improved activity tolerance.  Walked in the room on room air, and practiced stair negotiation; O2 sats ranged 90-92% during session.  She demonstrates improved safety awareness, and reports she definitely has 24 hour assist at discharge.   Follow Up Recommendations  Home health PT (Covid Cares Program if eligible)     Equipment Recommendations  None recommended by PT    Recommendations for Other Services       Precautions / Restrictions Precautions Precautions: Fall    Mobility  Bed Mobility Overal bed mobility: Needs Assistance Bed Mobility: Supine to Sit     Supine to sit: Supervision         Transfers Overall transfer level: Needs assistance Equipment used: 1 person hand held assist Transfers: Sit to/from Stand Sit to Stand: Min guard         General transfer comment: Min unilateral handheld assist to steady at initial stand; noted decr control of descent to sit  Ambulation/Gait Ambulation/Gait assistance: Min assist;Min guard Gait Distance (Feet): 40 Feet Assistive device: Rolling walker (2 wheeled)   Gait velocity: Decreased   General Gait Details: Cues to self-monitor for activity tolerance; Good RW proximity during ambulation in room; one notable loss of balance with turn, requiring min assist to steady; tells me she will have assist when getting up and around at home   Stairs Stairs: Yes Stairs assistance: Mod assist Stair Management: No rails;Forwards;Step to pattern Number of Stairs: 1 (x4 reps) General stair comments: Performed stair negotiation with one step over 4 reps; light mod assist to steady, especially with descending teh step; tells me she will have plenty of help with her stairs at home   Wheelchair Mobility    Modified Rankin (Stroke Patients Only) Modified Rankin (Stroke Patients Only) Pre-Morbid Rankin Score: No symptoms Modified Rankin: Moderate disability     Balance Overall balance assessment: Needs assistance Sitting-balance support: Feet supported;No upper extremity supported Sitting balance-Leahy Scale: Fair     Standing balance support: No upper extremity supported Standing balance-Leahy Scale: Fair Standing balance comment: able to maintain static standing with min guard assist and without UE support  Cognition Arousal/Alertness: Awake/alert Behavior During Therapy: Flat affect Overall Cognitive Status: Impaired/Different from baseline Area of Impairment: Attention                   Current Attention Level: Selective Memory: Decreased short-term memory Following  Commands: Follows one step commands consistently     Problem Solving: Slow processing;Requires verbal cues General Comments: Pt continues with flat affect, but is beginning to show inflection at times. She is now able to answer questions in full sentences and engages in conversation with only slight delay.        Exercises Other Exercises Other Exercises: Pt eager to eat lunch, but reinforced need to peform HEP at least 3x/day and she verbalized understanding     General Comments General comments (skin integrity, edema, etc.): reviewed safety at home, need for someone with her when ambulating and need to sit to shower.  She verbalizes understanding and reports her daughter and grandson will be providing 24 hour assist       Pertinent Vitals/Pain Pain Assessment: No/denies pain    Home Living                      Prior Function            PT Goals (current goals can now be found in the care plan section) Acute Rehab PT Goals Patient Stated Goal: to go home!  PT Goal Formulation: With patient Time For Goal Achievement: 02/26/20 Potential to Achieve Goals: Good Progress towards PT goals: Progressing toward goals    Frequency    Min 3X/week      PT Plan Current plan remains appropriate    Co-evaluation              AM-PAC PT "6 Clicks" Mobility   Outcome Measure  Help needed turning from your back to your side while in a flat bed without using bedrails?: None Help needed moving from lying on your back to sitting on the side of a flat bed without using bedrails?: None Help needed moving to and from a bed to a chair (including a wheelchair)?: None Help needed standing up from a chair using your arms (e.g., wheelchair or bedside chair)?: A Little Help needed to walk in hospital room?: A Little Help needed climbing 3-5 steps with a railing? : A Little 6 Click Score: 21    End of Session Equipment Utilized During Treatment: Gait belt Activity Tolerance:  Patient tolerated treatment well Patient left: in chair;with call bell/phone within reach;with chair alarm set Nurse Communication: Mobility status PT Visit Diagnosis: Unsteadiness on feet (R26.81);Muscle weakness (generalized) (M62.81);Difficulty in walking, not elsewhere classified (R26.2)     Time: 2956-2130 PT Time Calculation (min) (ACUTE ONLY): 16 min  Charges:  $Gait Training: 8-22 mins                     Roney Marion, [place]  Acute Rehabilitation Services Pager [phone] Office [place] 02/21/2020, 2:38 PM

## 2020-02-21 NOTE — Care Management (Signed)
Notified [facility] of discharge.

## 2020-02-21 NOTE — Progress Notes (Signed)
Patient transported home by daughter

## 2020-02-22 LAB — CULTURE, BLOOD (ROUTINE X 2)
Culture: NO GROWTH
Culture: NO GROWTH
Special Requests: ADEQUATE
Special Requests: ADEQUATE

## 2020-02-27 ENCOUNTER — Other Ambulatory Visit: Payer: Self-pay | Admitting: Cardiology

## 2020-03-02 ENCOUNTER — Other Ambulatory Visit: Payer: Self-pay | Admitting: Internal Medicine

## 2020-03-02 DIAGNOSIS — U071 COVID-19: Secondary | ICD-10-CM

## 2020-03-28 ENCOUNTER — Other Ambulatory Visit: Payer: Self-pay | Admitting: Family Medicine

## 2020-03-28 DIAGNOSIS — E041 Nontoxic single thyroid nodule: Secondary | ICD-10-CM

## 2020-04-05 ENCOUNTER — Ambulatory Visit
Admission: RE | Admit: 2020-04-05 | Discharge: 2020-04-05 | Disposition: A | Discharge status: Home / Self Care | Payer: Medicare Other | Source: Ambulatory Visit | Attending: Family Medicine | Admitting: Family Medicine

## 2020-04-05 ENCOUNTER — Other Ambulatory Visit: Payer: Self-pay | Admitting: Family Medicine

## 2020-04-05 DIAGNOSIS — E041 Nontoxic single thyroid nodule: Secondary | ICD-10-CM

## 2020-04-29 ENCOUNTER — Other Ambulatory Visit: Payer: Self-pay | Admitting: Family Medicine

## 2020-04-29 DIAGNOSIS — M79605 Pain in left leg: Secondary | ICD-10-CM

## 2020-04-29 DIAGNOSIS — M79661 Pain in right lower leg: Secondary | ICD-10-CM

## 2020-04-29 DIAGNOSIS — I739 Peripheral vascular disease, unspecified: Secondary | ICD-10-CM

## 2020-05-10 ENCOUNTER — Other Ambulatory Visit: Payer: Self-pay | Admitting: Family Medicine

## 2020-05-10 DIAGNOSIS — M79605 Pain in left leg: Secondary | ICD-10-CM

## 2020-05-10 DIAGNOSIS — I739 Peripheral vascular disease, unspecified: Secondary | ICD-10-CM

## 2020-05-10 DIAGNOSIS — M79661 Pain in right lower leg: Secondary | ICD-10-CM

## 2020-05-19 ENCOUNTER — Ambulatory Visit
Admission: RE | Admit: 2020-05-19 | Discharge: 2020-05-19 | Disposition: A | Discharge status: Home / Self Care | Payer: Medicare Other | Source: Ambulatory Visit | Attending: Family Medicine | Admitting: Family Medicine

## 2020-05-19 DIAGNOSIS — I739 Peripheral vascular disease, unspecified: Secondary | ICD-10-CM

## 2020-05-19 DIAGNOSIS — M79605 Pain in left leg: Secondary | ICD-10-CM

## 2020-05-19 DIAGNOSIS — M79661 Pain in right lower leg: Secondary | ICD-10-CM

## 2020-06-30 ENCOUNTER — Other Ambulatory Visit: Payer: Self-pay | Admitting: Cardiology

## 2020-08-13 ENCOUNTER — Other Ambulatory Visit: Payer: Self-pay | Admitting: Cardiology

## 2020-08-23 ENCOUNTER — Other Ambulatory Visit: Payer: Self-pay | Admitting: Cardiology

## 2020-08-24 ENCOUNTER — Other Ambulatory Visit: Payer: Self-pay | Admitting: Cardiology

## 2020-09-05 ENCOUNTER — Other Ambulatory Visit: Payer: Self-pay | Admitting: Student

## 2020-09-19 DIAGNOSIS — I2510 Atherosclerotic heart disease of native coronary artery without angina pectoris: Secondary | ICD-10-CM | POA: Diagnosis not present

## 2020-09-19 DIAGNOSIS — E538 Deficiency of other specified B group vitamins: Secondary | ICD-10-CM | POA: Diagnosis not present

## 2020-09-19 DIAGNOSIS — N183 Chronic kidney disease, stage 3 unspecified: Secondary | ICD-10-CM | POA: Diagnosis not present

## 2020-09-19 DIAGNOSIS — I10 Essential (primary) hypertension: Secondary | ICD-10-CM | POA: Diagnosis not present

## 2020-09-19 DIAGNOSIS — E7800 Pure hypercholesterolemia, unspecified: Secondary | ICD-10-CM | POA: Diagnosis not present

## 2020-09-19 DIAGNOSIS — I5022 Chronic systolic (congestive) heart failure: Secondary | ICD-10-CM | POA: Diagnosis not present

## 2020-09-19 DIAGNOSIS — R413 Other amnesia: Secondary | ICD-10-CM | POA: Diagnosis not present

## 2020-09-19 DIAGNOSIS — E1122 Type 2 diabetes mellitus with diabetic chronic kidney disease: Secondary | ICD-10-CM | POA: Diagnosis not present

## 2020-09-19 DIAGNOSIS — Z7984 Long term (current) use of oral hypoglycemic drugs: Secondary | ICD-10-CM | POA: Diagnosis not present

## 2020-09-19 DIAGNOSIS — R1030 Lower abdominal pain, unspecified: Secondary | ICD-10-CM | POA: Diagnosis not present

## 2020-09-26 DIAGNOSIS — E875 Hyperkalemia: Secondary | ICD-10-CM | POA: Diagnosis not present

## 2020-10-11 ENCOUNTER — Other Ambulatory Visit: Payer: Self-pay | Admitting: Family Medicine

## 2020-10-11 DIAGNOSIS — E041 Nontoxic single thyroid nodule: Secondary | ICD-10-CM

## 2020-10-17 ENCOUNTER — Ambulatory Visit
Admission: RE | Admit: 2020-10-17 | Discharge: 2020-10-17 | Disposition: A | Discharge status: Home / Self Care | Payer: Medicare Other | Source: Ambulatory Visit | Attending: Family Medicine | Admitting: Family Medicine

## 2020-10-17 DIAGNOSIS — E041 Nontoxic single thyroid nodule: Secondary | ICD-10-CM | POA: Diagnosis not present

## 2020-11-08 DIAGNOSIS — K219 Gastro-esophageal reflux disease without esophagitis: Secondary | ICD-10-CM | POA: Diagnosis not present

## 2020-11-08 DIAGNOSIS — E7800 Pure hypercholesterolemia, unspecified: Secondary | ICD-10-CM | POA: Diagnosis not present

## 2020-11-08 DIAGNOSIS — D509 Iron deficiency anemia, unspecified: Secondary | ICD-10-CM | POA: Diagnosis not present

## 2020-11-08 DIAGNOSIS — I2510 Atherosclerotic heart disease of native coronary artery without angina pectoris: Secondary | ICD-10-CM | POA: Diagnosis not present

## 2020-11-08 DIAGNOSIS — I5022 Chronic systolic (congestive) heart failure: Secondary | ICD-10-CM | POA: Diagnosis not present

## 2020-11-08 DIAGNOSIS — E1122 Type 2 diabetes mellitus with diabetic chronic kidney disease: Secondary | ICD-10-CM | POA: Diagnosis not present

## 2020-11-08 DIAGNOSIS — I5032 Chronic diastolic (congestive) heart failure: Secondary | ICD-10-CM | POA: Diagnosis not present

## 2020-11-08 DIAGNOSIS — I10 Essential (primary) hypertension: Secondary | ICD-10-CM | POA: Diagnosis not present

## 2020-11-08 DIAGNOSIS — N183 Chronic kidney disease, stage 3 unspecified: Secondary | ICD-10-CM | POA: Diagnosis not present

## 2020-11-21 ENCOUNTER — Encounter: Payer: Self-pay | Admitting: Neurology

## 2020-11-21 ENCOUNTER — Ambulatory Visit: Payer: Medicare Other | Admitting: Neurology

## 2020-11-21 VITALS — BP 128/78 | HR 88 | Ht 59.5 in | Wt 187.0 lb

## 2020-11-21 DIAGNOSIS — E669 Obesity, unspecified: Secondary | ICD-10-CM | POA: Diagnosis not present

## 2020-11-21 DIAGNOSIS — R413 Other amnesia: Secondary | ICD-10-CM

## 2020-11-21 DIAGNOSIS — Z8673 Personal history of transient ischemic attack (TIA), and cerebral infarction without residual deficits: Secondary | ICD-10-CM | POA: Diagnosis not present

## 2020-11-21 DIAGNOSIS — G4733 Obstructive sleep apnea (adult) (pediatric): Secondary | ICD-10-CM | POA: Diagnosis not present

## 2020-11-21 DIAGNOSIS — R4789 Other speech disturbances: Secondary | ICD-10-CM | POA: Diagnosis not present

## 2020-11-21 NOTE — Progress Notes (Signed)
Subjective:    Patient ID: [name] is a 70 y.o. female.  HPI     Star Age, MD, PhD [facility] [address], [address] [place], [place] [address]  Dear Dr. Tamala Julian,   I saw your patient, [name], upon your kind request, in my neurologic clinic today for initial consultation of her memory loss.  The patient is accompanied by her husband today.  As you know, [name] is a 70 year old right-handed woman with an underlying complex medical history of chronic combined systolic and diastolic congestive heart failure, chronic kidney disease, hypertension, hyperlipidemia, coronary artery disease with status post MI, status post stents, obesity, obstructive sleep apnea, (not on PAP therapy in at least 2 years, by self report), history of restless leg syndrome, thyroid disease, type 2 diabetes, arthritis, anemia, anxiety, retinopathy, reflux disease, migraine headaches, history of COVID, and depression, who reports difficulty with her memory since she was in the hospital with COVID.  She feels that her symptoms have plateaued, not progressive, not necessarily any better either.  She has had difficulty with comprehension and word finding difficulty.  She has not had any confusion or disorientation, no recent falls but does feel wobbly.  She had used a walker, has bilateral hip pain and was told that this is bursitis.  She does not have a strong family history of dementia but recalls that her maternal grandmother had dementia.  Neither parents had dementia, she has 1 full brother who is 8 years younger and one half brother on mom side and several half siblings on dad side, all younger.  She has 3 children, lives with her husband, 8 grandchildren and 6 great-grandchildren.  She quit smoking some 20 years ago, does not drink alcohol and drinks about 4 cups of water per day on average, 2 bottles of diet Dr. Malachi Bonds, 16.9 ounce size each.  She has not used her CPAP  or positive airway pressure machine in over 2 years.  She reports that they had bedbugs and packed up the machine so they could get their rooms sprayed and she never unpacked the machine.  Sleep testing by her recollection was over 5 years ago.  She believes that she had mild sleep apnea.  Weight has been more or less stable.  Epworth sleepiness score is 9 out of 24.  I reviewed your office note from 09/19/2020.  She reports memory issues since her COVID infection.  She had blood work through your office including A1c, CMP, lipid panel, vitamin B12.  Her A1c was elevated at 7.9, blood sugar 164, BUN 31, creatinine 1.17.  Lipid panel showed total cholesterol of 126, triglycerides 217, HDL 36, LDL 55, B12 870.  She was hospitalized and August 2021 due to left-sided weakness.  She presented to the emergency room via EMS.  She had tested positive for COVID in August 2021.  Upon admission to the hospital on 02/17/2020 she was noted to have bilateral multifocal infiltrates on chest x-ray, creatinine was elevated at 2.4, she was requiring supplemental oxygen.  She was treated with remdesivir for total 5 days and steroids, had stroke work-up including CT angiogram of the head and neck.  She also had a brain MRI.  Head CT without contrast on 02/17/2020 showed: Impression: No acute finding.  Remote lacunar infarct at the right thalamus.  She had a CT angiogram of the head and neck on 02/17/2020 and I reviewed the results:  IMPRESSION: 1. No emergent finding including  large vessel occlusion. 2. No proximal flow limiting stenosis or embolic source. 3. These at least moderate left P2 segment stenosis. 4. Patchy bilateral pneumonia. 5. Nodular thyroid. Recommend thyroid US if appropriate for comorbidities. (Ref: J Am Coll Radiol. 2015 Feb;12(2): 143-50).   She had a brain MRI without contrast on 02/18/2020 and I reviewed the results: IMPRESSION: No evidence of recent infarction, hemorrhage, or mass.   Chronic  microvascular ischemic changes. Small chronic right thalamic infarct.   A partially empty sella was noted.  She had a thyroid nodule biopsy recently.  She is on baby aspirin.  She is on atorvastatin 80 mg daily and Zetia.  She is not sure if she is also taking Tricor, she believes that she is no longer taking it.  She is participating in a diabetes study and is on a weekly injectable throughout the study.  Her Past Medical History Is Significant For: Past Medical History:  Diagnosis Date  . Anemia    "I see dr. Burr Medico @ [place]; I've had transfusion for low iron (04/17/2016)  . Anxiety   . Arthritis    "left hip, fingers"  (04/17/2016)  . At risk for medication noncompliance   . Atypical chest pain    a. atypical symptoms since prior cath.  . CAD (coronary artery disease)    a. s/p 2007  LAD stent. b. s/p 07/2006 CABG with LIMA-LAD & SVG to Diag. c. 12/2006 diagonal branch w/ 2.5 x 12 mm Endeavor stent. d. s/p 2010 cath with small vessel disease treated with RX, stable by repeat 2015. e. CP/abnl nuc -> LHC 04/17/16: new ostial stenosis of LIMA-LAD s/p DES, EF 45%, chronically occ SVG-D2. f. Stable cath 12/2016.  Marland Kitchen Chronic combined systolic and diastolic CHF (congestive heart failure) ([place])   . CKD (chronic kidney disease), stage II   . Depression   . GERD (gastroesophageal reflux disease)   . [id number])    "maybe once/month" (04/17/2016)  . Hyperlipidemia   . Hypertension   . Migraines    "couple times/yr"  ((04/17/2016)  . Myocardial infarction [facility]) ?2008  . Obesity   . OSA on CPAP    "suppose to wear mask; can't find it" (10/17//2017)  . Osteopenia 12/2010 &01/21/2013   Minimal of hip DEXA   . Pneumonia    "several years" (04/17/2016)  . Retinopathy 12/08   right  . RLS (restless legs syndrome)   . Thyroid disease    "blood work didn't show it but my dr said he can tell by looking at me; I had radiation" (04/17/2016)  . Type II diabetes mellitus ([place])     Her  Past Surgical History Is Significant For: Past Surgical History:  Procedure Laterality Date  . CARDIAC CATHETERIZATION  02/03/2008   for CP patent LIMA w mid-vessel spasm.  Marland Kitchen CARDIAC CATHETERIZATION N/A 04/17/2016   Procedure: Left Heart Cath and Cors/Grafts Angiography;  Surgeon: Peter M Martinique, MD;  Location: [place] [facility];  Service: Cardiovascular;  Laterality: N/A;  . CARDIAC CATHETERIZATION N/A 04/17/2016   Procedure: Coronary Stent Intervention;  Surgeon: Peter M Martinique, MD;  Location: [place] [facility];  Service: Cardiovascular;  Laterality: N/A;  . COLONOSCOPY  10/2010   rectal polyp  . CORONARY ANGIOPLASTY WITH STENT PLACEMENT     "I've had quite a few stents"   . CORONARY ANGIOPLASTY WITH STENT PLACEMENT  04/17/2016  . CORONARY ARTERY BYPASS GRAFT  ?2008   s/p two vessel CABG off pump LAD diagonal and  PTCA/DE Stent mid LAD/ DE stent LAD diagonal through previous stent sstruts,occluded SVG-diagonal  . LAPAROSCOPIC CHOLECYSTECTOMY  1990's  . LEFT HEART CATH AND CORS/GRAFTS ANGIOGRAPHY N/A 12/31/2016   Procedure: Left Heart Cath and Cors/Grafts Angiography;  Surgeon: Martinique, Peter M, MD;  Location: [facility];  Service: Cardiovascular;  Laterality: N/A;  . LEFT HEART CATHETERIZATION WITH CORONARY ANGIOGRAM N/A 07/15/2013   Procedure: LEFT HEART CATHETERIZATION WITH CORONARY ANGIOGRAM;  Surgeon: Peter M Martinique, MD;  Location: [facility];  Service: Cardiovascular;  Laterality: N/A;  . TUBAL LIGATION  1976    Her Family History Is Significant For: Family History  Problem Relation Age of Onset  . Cancer Father        Lung  . Hypertension Mother   . Cancer Mother        Lung  . Diabetes Mother   . Diabetes Maternal Grandmother   . Alzheimer's disease Maternal Grandmother     Her Social History Is Significant For: Social History   Socioeconomic History  . Marital status: Married    Spouse name: Not on file  . Number of children: Not on file  . Years of education:  Not on file  . Highest education level: Not on file  Occupational History  . Not on file  Tobacco Use  . Smoking status: Former Smoker    Years: 25.00    Types: Cigarettes    Quit date: 07/02/1989    Years since quitting: 31.4  . Smokeless tobacco: Never Used  . Tobacco comment: "smoked 2-3 cigarettes/week"  Substance and Sexual Activity  . Alcohol use: No  . Drug use: No  . Sexual activity: Yes  Other Topics Concern  . Not on file  Social History Narrative  . Not on file   Social Determinants of Health   Financial Resource Strain: Not on file  Food Insecurity: Not on file  Transportation Needs: Not on file  Physical Activity: Not on file  Stress: Not on file  Social Connections: Not on file    Her Allergies Are:  No Known Allergies:   Her Current Medications Are:  Outpatient Encounter Medications as of 11/21/2020  Medication Sig  . aspirin EC 81 MG tablet Take 1 tablet (81 mg total) by mouth daily.  Marland Kitchen atorvastatin (LIPITOR) 80 MG tablet Take 80 mg by mouth daily.  . empagliflozin (JARDIANCE) 10 MG TABS tablet Take by mouth daily.  . fenofibrate (TRICOR) 48 MG tablet TAKE 1 TABLET BY MOUTH  DAILY  . lisinopril (ZESTRIL) 5 MG tablet Take 5 mg by mouth daily.  . Magnesium 250 MG TABS Take by mouth.  . metFORMIN (GLUCOPHAGE) 1000 MG tablet TAKE 1 TABLET BY MOUTH 2 TIMES DAILY WITH A MEAL. (Patient taking differently: Take 1,000 mg by mouth 2 (two) times daily with a meal.)  . VITAMIN D PO Take 50 mcg by mouth.  Marland Kitchen acetaminophen (TYLENOL) 500 MG tablet Take 2 tablets (1,000 mg total) by mouth every 6 (six) hours as needed for moderate pain or headache.  . ezetimibe (ZETIA) 10 MG tablet TAKE 1 TABLET BY MOUTH  DAILY  . isosorbide mononitrate (IMDUR) 60 MG 24 hr tablet Take 1 tablet (60 mg total) by mouth daily.  . metoprolol tartrate (LOPRESSOR) 25 MG tablet Take 1 tablet (25 mg total) by mouth 2 (two) times daily.  . [DISCONTINUED] furosemide (LASIX) 20 MG tablet Take 1  tablet (20 mg total) by mouth daily. Please make yearly appt with Dr. Radford Pax for  December for future refills. 1st attempt  . [DISCONTINUED] glimepiride (AMARYL) 4 MG tablet Take 2 mg by mouth 2 (two) times daily.   . [DISCONTINUED] insulin lispro (HUMALOG) 100 UNIT/ML KwikPen Inject 0.02-0.15 mLs (2-15 Units total) into the skin 3 (three) times daily after meals. 70 to 120  No insulin, 121 to150 2 units, 151to200 4 units, 201 to 250 6 units, 251 to 300 8 units,  301 to 350 10, units 351 to 400 15 units, More than 400 use 15 units and notify your doctor.   No facility-administered encounter medications on file as of 11/21/2020.  :   Review of Systems:  Out of a complete 14 point review of systems, all are reviewed and negative with the exception of these symptoms as listed below:  Review of Systems  Neurological:       Here for consult on worsening memory. Reports back in March she was at a Bible study and had difficulty focusing. Hx covid- dx back in August 2021 and since reports foggy feelings in the head.     Objective:  Neurological Exam  Physical Exam Physical Examination:   Vitals:   11/21/20 0739  BP: 128/78  Pulse: 88    General Examination: The patient is a very pleasant 70 y.o. female in no acute distress. She appears well-developed and well-nourished and well groomed.   HEENT: Normocephalic, atraumatic, pupils are equal, round and reactive to light, extraocular tracking is well-preserved, hearing is grossly intact. Face is symmetric with normal facial animation and normal facial sensation to light touch, temperature, and vibration. Speech is clear with no dysarthria noted. There is no hypophonia. There is no lip, neck/head, jaw or voice tremor. Neck is supple with full range of passive and active motion. There are no carotid bruits on auscultation. Oropharynx exam reveals: mild mouth dryness, adequate dental hygiene and mild airway crowding, due to small airway entry, tonsils  on the smaller side, Mallampati class II, neck circumference of 15 5/8 inches.  Chest: Clear to auscultation without wheezing, rhonchi or crackles noted.  Heart: S1+S2+0, regular and normal without murmurs, rubs or gallops noted.   Abdomen: Soft, non-tender and non-distended with normal bowel sounds appreciated on auscultation.  Extremities: There is trace pitting edema in the distal lower extremities bilaterally. Pedal pulses are intact.  Skin: Warm and dry without trophic changes noted.  Musculoskeletal: exam reveals no obvious joint deformities, tenderness or joint swelling or erythema.   Neurologically:  Mental status: The patient is awake, alert and oriented in all 4 spheres. Her immediate and remote memory, attention, language skills and fund of knowledge are appropriate.  She provides her own history, her husband provides just a few additional pointers.   On 11/21/2020: MMSE: 29/30, CDT: 4/4, AFT: 6/min. There is no evidence of aphasia, agnosia, apraxia or anomia. Speech is clear with normal prosody and enunciation. Thought process is linear. Mood is normal and affect is normal.  Cranial nerves II - XII are as described above under HEENT exam. In addition: shoulder shrug is normal with equal shoulder height noted. Motor exam: Normal bulk, strength and tone is noted. There is no drift, tremor or rebound. Reflexes are 1+ in the upper extremities, trace in the knees and absent in the ankles.  Babinski: Toes are flexor bilaterally. Fine motor skills and coordination: intact with normal finger taps, normal hand movements, normal rapid alternating patting, normal foot taps and normal foot agility.  Cerebellar testing: No dysmetria or intention tremor. Heel to shin  is unremarkable bilaterally. There is no truncal or gait ataxia.  Sensory exam: intact to light touch, vibration, temperature sense in the upper and lower extremities.  Gait, station and balance: She stands with mild difficulty and  pushes herself up.  She stands naturally wider base.  She feels insecure standing narrow based.  Romberg is not tested due to safety concerns.  She did not bring her walker today.  She does report using a walker for longer distances.  She walks slightly wider base, left foot is turned out with toes pointing outwards.  She walks with preserved arm swing, no shuffling noted.  She walks somewhat slowly and and securely.    Assessment and Plan:   In summary, [name] is a very pleasant 70 y.o.-year old female with an underlying complex medical history of chronic combined systolic and diastolic congestive heart failure, chronic kidney disease, hypertension, hyperlipidemia, prior stroke, coronary artery disease with status post MI, status post stents, obesity, obstructive sleep apnea, (not on PAP therapy in at least 2 years, by self report), history of restless leg syndrome, thyroid disease, type 2 diabetes, arthritis, anemia, anxiety, retinopathy, reflux disease, migraine headaches, history of COVID, and depression, who presents for evaluation of her memory disturbance which she has noticed since August 2021, when she was hospitalized with COVID.  She has not had much in the way of progression, no significant or strong family history of dementia in the family.  She does have multiple vascular risk factors including congestive heart failure, hypertension, hyperlipidemia, coronary artery disease, and sleep apnea.  She is currently not on treatment with her PAP machine for her sleep apnea, sleep testing was several years ago and she has not been on treatment for over 2 years as per her recollection.  Memory scores were fairly benign today.  Her naming speed was below normal, otherwise she lost one-point on orientation to place only.  She is largely reassured.  We talked about secondary prevention.  We talked about stroke prevention but also overall healthy lifestyle.  She is advised to continue to work on weight  loss, continue to work towards optimization of diabetes control.  She is advised to continue with her current medications.  I did not suggest adding a new medication.  She had a head CT and brain MRI in August 2021.  We talked about the importance of treating sleep apnea, especially when it is in the moderate and severe range due to increase in cardiovascular complications with untreated OSA.  She would be willing to get rechecked for her sleep apnea and she may qualify for new equipment.  To that end, we will proceed with a sleep study and plan a follow-up afterwards.  She is advised to stay better hydrated with water, 6 to 8 cups/day, 8 ounce size each and she is encouraged to limit her nt. We will continue to monitor her memory.  We will call her to schedule her sleep study once we have insurance authorization.  I answered all the questions today and the patient and her husband were in agreement. Thank you very much for allowing me to participate in the care of this nice patient. If I can be of any further assistance to you please do not hesitate to call me at [phone].  Sincerely,   Star Age, MD, PhD

## 2020-11-21 NOTE — Patient Instructions (Signed)
  It was nice to meet you both today!   Here is what we discussed today and what we came up with as our plan for you:   You have complaints of memory loss: memory loss or changes in cognitive function can have many reasons and does not always mean you have dementia. Conditions that can contribute to subjective or objective memory loss include: depression, stress, poor sleep from insomnia or sleep apnea, dehydration, fluctuation in blood sugar values, thyroid or electrolyte dysfunction and certain vitamin deficiencies. Dementia can be caused by stroke, brain atherosclerosis or brain vascular disease due to vascular risk factors (smoking, high blood pressure, high cholesterol, obesity and uncontrolled diabetes), certain degenerative brain disorders (including Parkinson's disease and Multiple sclerosis) and by Alzheimer's disease or other, more rare and sometimes hereditary causes.   You have had recent brain scans.   You have had recent blood work through your primary care.  I do not believe we have to add any new blood test today.    Please continue to work on weight loss and optimizing your diabetes control.    Please try to hydrate better with water, 6 to 8 cups/day are recommended, generally speaking, 8 ounce size each.  Please try to stay active physically, talk to your primary care about seeing orthopedics if your hip pain bothers you more.  We will continue to monitor your memory.  Given your history of sleep apnea, you may still be at risk for underlying obstructive sleep apnea and since you have not used your CPAP machine in over 2 years and testing was over 5 years ago, I think you would benefit from rechecking your sleep apnea with a sleep study.  An attended sleep study meaning you get to stay overnight in the sleep lab, lets [place] monitor sleep-related behaviors such as sleep talking and leg movements in sleep, in addition to monitoring for sleep apnea.  A home sleep test is a screening  tool for sleep apnea only, and unfortunately does not help with any other sleep-related diagnoses.  Please remember, the long-term risks and ramifications of untreated moderate to severe obstructive sleep apnea are: increased Cardiovascular disease, including congestive heart failure, stroke, difficult to control hypertension, treatment resistant obesity, arrhythmias, especially irregular heartbeat commonly known as A. Fib. (atrial fibrillation); even type 2 diabetes has been linked to untreated OSA.  We will plan a follow-up after your testing and if you qualify for CPAP or AutoPap therapy, I would like to prescribe a new machine for you.   Our sleep lab administrative assistant will call you to schedule your sleep study and give you further instructions, regarding the check in process for the sleep study, arrival time, what to bring, when you can expect to leave after the study, etc., and to answer any other logistical questions you may have. If you don't hear back from her by about 2 weeks from now, please feel free to call her direct line at [phone] or you can call our general clinic number, or email [place] through My Chart.

## 2020-11-29 ENCOUNTER — Telehealth: Payer: Self-pay

## 2020-11-29 NOTE — Telephone Encounter (Signed)
LVM for pt to call me back to schedule sleep study  

## 2020-12-06 IMAGING — CT CT HEAD CODE STROKE
3 series · 14 of 47 positions shown, 16 images · non-contrast
Comparison: 08/13/2015

CLINICAL DATA: Code stroke.  Acute neuro deficit.

EXAM:
CT HEAD WITHOUT CONTRAST
TECHNIQUE: Contiguous axial images were obtained from the base of the skull
through the vertex without intravenous contrast.

[Series 3: head 5.0 h30s · axial · 0.40mm/px · z∈[-147,+8]mm · 8 of 37 slices shown, 10 images]
[im 3/37  brain]
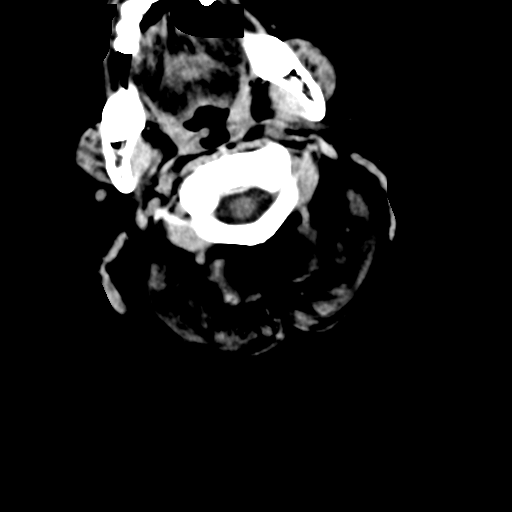
[im 3/37  bone]
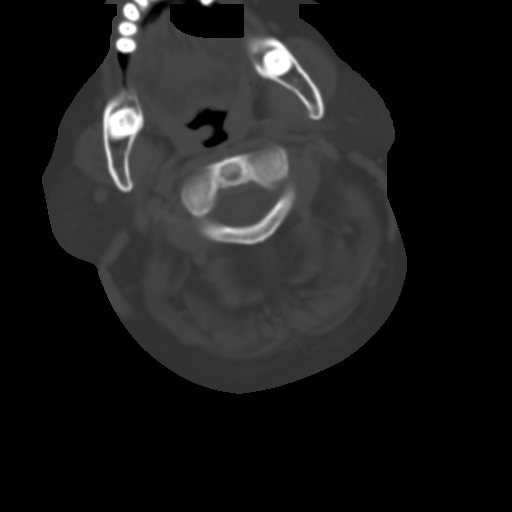
[im 8/37  brain]
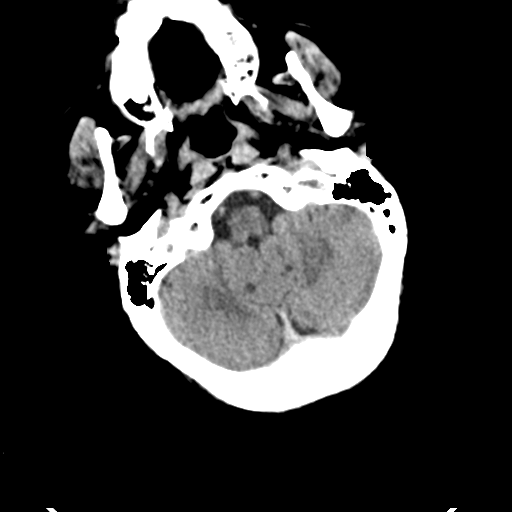
[im 12/37  brain]
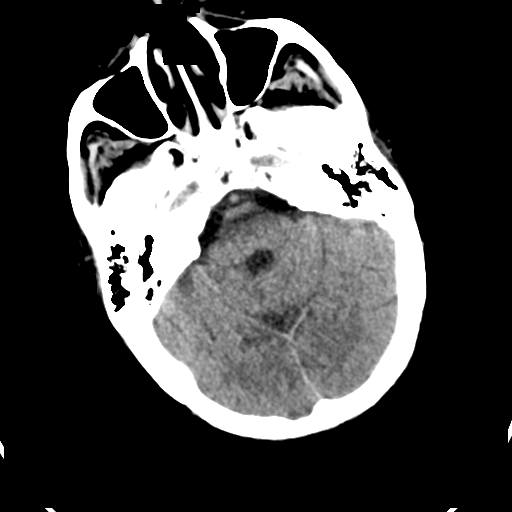
[im 17/37  brain]
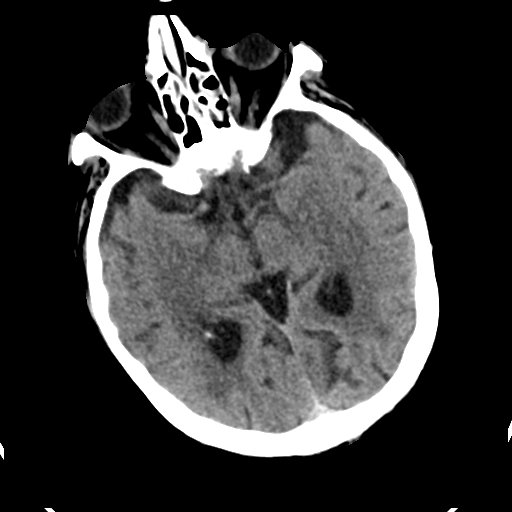
[im 20/37  brain]
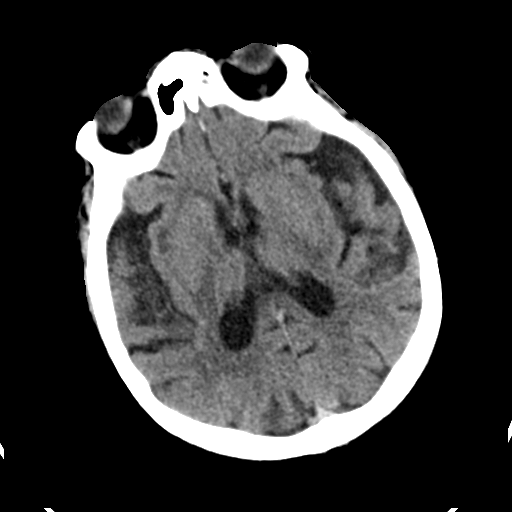
[im 20/37  bone]
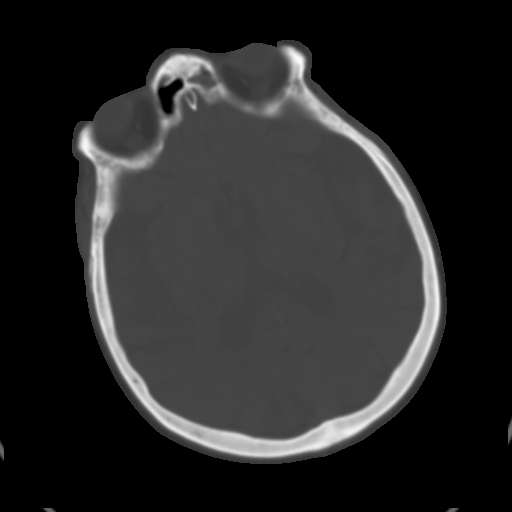
[im 25/37  brain]
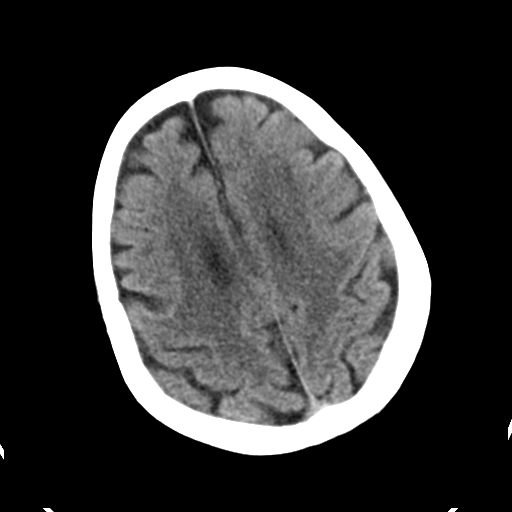
[im 29/37  brain]
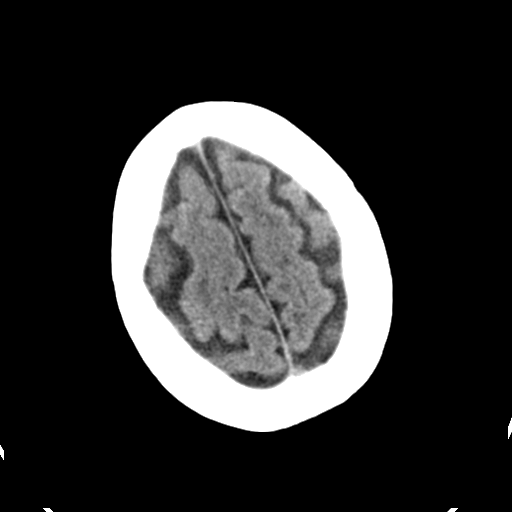
[im 34/37  brain]
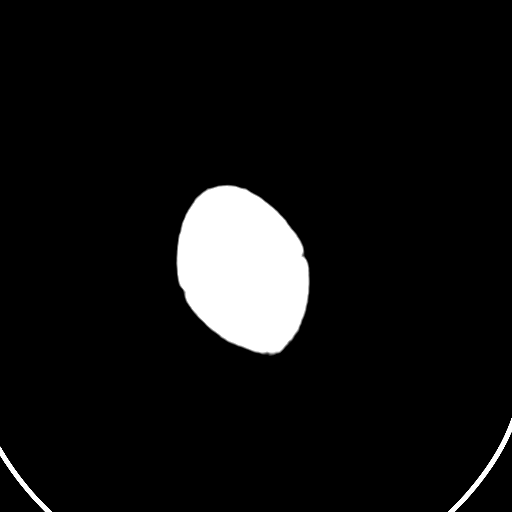

[Series 5: head 3.0 mpr cor · coronal · 0.36mm/px · 3 of 67 slices shown]
[im 23/67  brain]
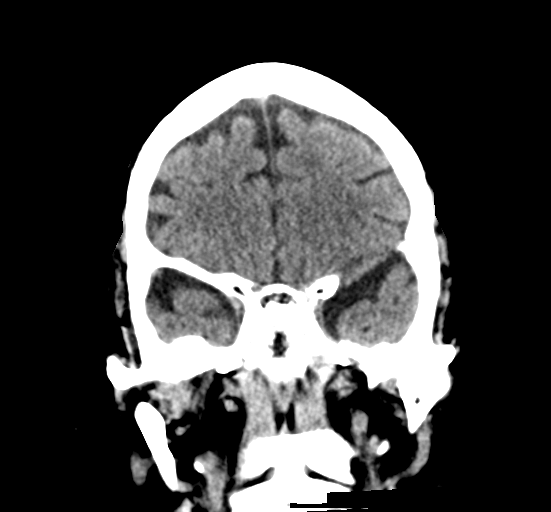
[im 30/67  brain]
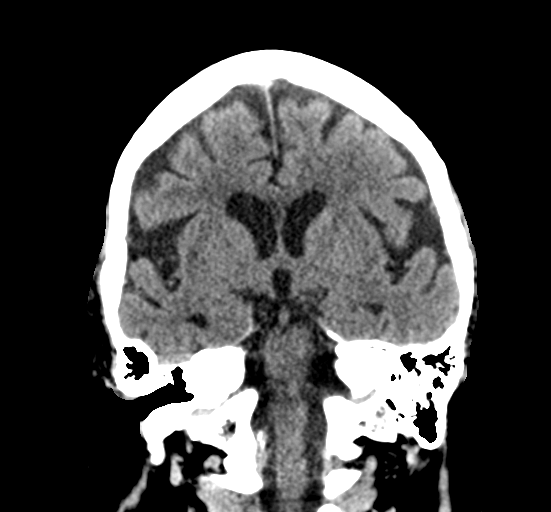
[im 37/67  brain]
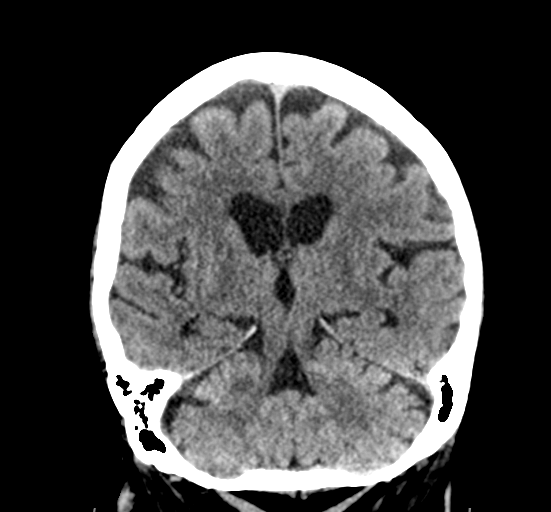

[Series 6: head 3.0 mpr sag · sagittal · 0.36mm/px · 3 of 66 slices shown]
[im 22/66  brain]
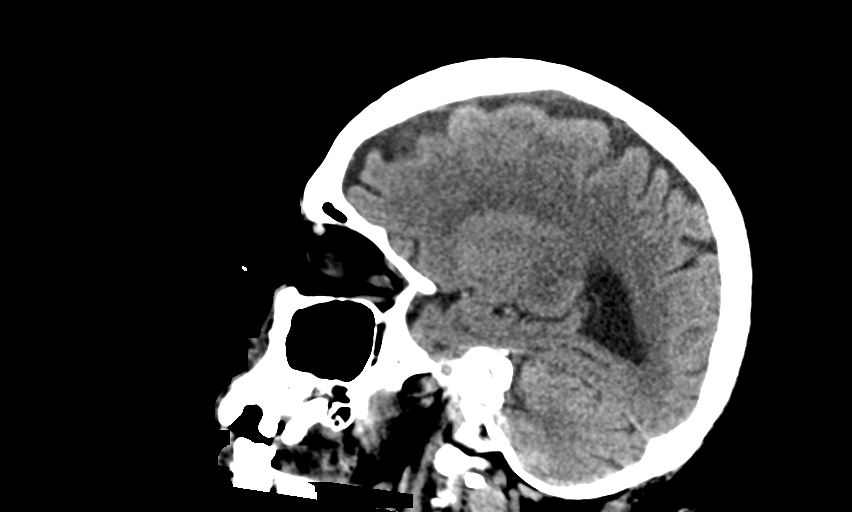
[im 33/66  brain]
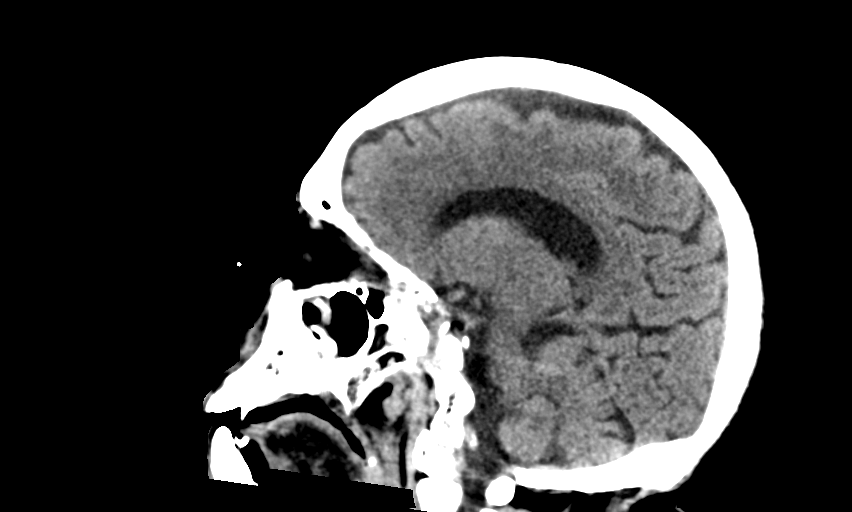
[im 44/66  brain]
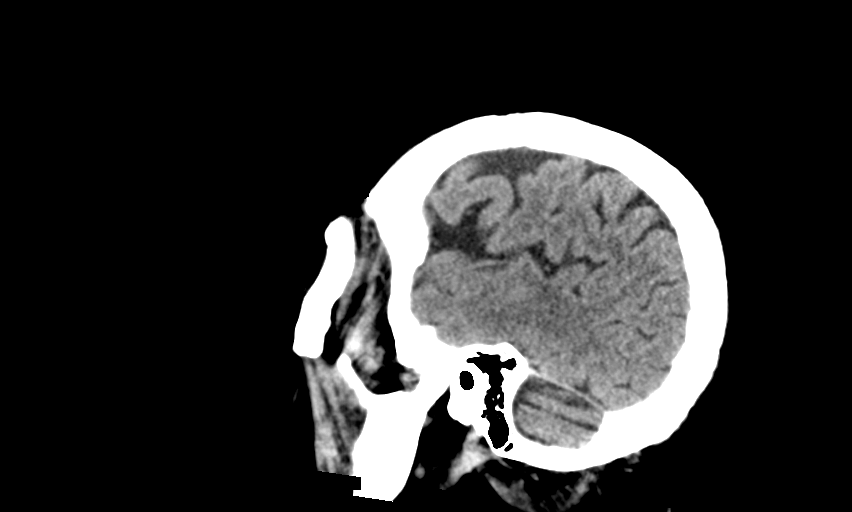

[14 of 47 positions shown; findings below may reference images not displayed]

FINDINGS: Brain: No evidence of acute infarction, hemorrhage, hydrocephalus,
extra-axial collection or mass lesion/mass effect. Well-defined and
low-density lacune at the right thalamus on coronal reformats,
interval.

Vascular: Atherosclerotic calcifications.  No hyperdense vessel.

Skull: Normal. Negative for fracture or focal lesion.

Sinuses/Orbits: No acute finding.

Other: These results were communicated to Dr. Lair at [DATE]
[place] 02/17/2020by text page via the [employer] messaging system.

ASPECTS (Alberta Stroke Program Early CT Score)

Not scored with this limited history.
IMPRESSION: 1. No acute finding.
2. Remote lacunar infarct at the right thalamus.

## 2020-12-22 DIAGNOSIS — E119 Type 2 diabetes mellitus without complications: Secondary | ICD-10-CM | POA: Diagnosis not present

## 2020-12-22 DIAGNOSIS — H25813 Combined forms of age-related cataract, bilateral: Secondary | ICD-10-CM | POA: Diagnosis not present

## 2020-12-22 DIAGNOSIS — H02831 Dermatochalasis of right upper eyelid: Secondary | ICD-10-CM | POA: Diagnosis not present

## 2020-12-22 DIAGNOSIS — H02834 Dermatochalasis of left upper eyelid: Secondary | ICD-10-CM | POA: Diagnosis not present

## 2020-12-22 DIAGNOSIS — H40013 Open angle with borderline findings, low risk, bilateral: Secondary | ICD-10-CM | POA: Diagnosis not present

## 2020-12-28 DIAGNOSIS — I2510 Atherosclerotic heart disease of native coronary artery without angina pectoris: Secondary | ICD-10-CM | POA: Diagnosis not present

## 2020-12-28 DIAGNOSIS — I10 Essential (primary) hypertension: Secondary | ICD-10-CM | POA: Diagnosis not present

## 2020-12-28 DIAGNOSIS — K219 Gastro-esophageal reflux disease without esophagitis: Secondary | ICD-10-CM | POA: Diagnosis not present

## 2020-12-28 DIAGNOSIS — I5032 Chronic diastolic (congestive) heart failure: Secondary | ICD-10-CM | POA: Diagnosis not present

## 2020-12-28 DIAGNOSIS — E1122 Type 2 diabetes mellitus with diabetic chronic kidney disease: Secondary | ICD-10-CM | POA: Diagnosis not present

## 2020-12-28 DIAGNOSIS — M8580 Other specified disorders of bone density and structure, unspecified site: Secondary | ICD-10-CM | POA: Diagnosis not present

## 2020-12-28 DIAGNOSIS — E7800 Pure hypercholesterolemia, unspecified: Secondary | ICD-10-CM | POA: Diagnosis not present

## 2020-12-28 DIAGNOSIS — N183 Chronic kidney disease, stage 3 unspecified: Secondary | ICD-10-CM | POA: Diagnosis not present

## 2020-12-28 DIAGNOSIS — I5022 Chronic systolic (congestive) heart failure: Secondary | ICD-10-CM | POA: Diagnosis not present

## 2020-12-28 DIAGNOSIS — D509 Iron deficiency anemia, unspecified: Secondary | ICD-10-CM | POA: Diagnosis not present

## 2021-01-05 ENCOUNTER — Other Ambulatory Visit: Payer: Self-pay

## 2021-01-05 ENCOUNTER — Ambulatory Visit (INDEPENDENT_AMBULATORY_CARE_PROVIDER_SITE_OTHER): Payer: Medicare Other | Admitting: Neurology

## 2021-01-05 DIAGNOSIS — Z8673 Personal history of transient ischemic attack (TIA), and cerebral infarction without residual deficits: Secondary | ICD-10-CM

## 2021-01-05 DIAGNOSIS — G4733 Obstructive sleep apnea (adult) (pediatric): Secondary | ICD-10-CM

## 2021-01-05 DIAGNOSIS — G4720 Circadian rhythm sleep disorder, unspecified type: Secondary | ICD-10-CM

## 2021-01-05 DIAGNOSIS — R4789 Other speech disturbances: Secondary | ICD-10-CM

## 2021-01-05 DIAGNOSIS — R413 Other amnesia: Secondary | ICD-10-CM

## 2021-01-05 DIAGNOSIS — E669 Obesity, unspecified: Secondary | ICD-10-CM

## 2021-01-25 NOTE — Procedures (Signed)
PATIENT'S NAME:  [name], [name] DOB:      1951/01/25      MR#:    [id number]     DATE OF RECORDING: 01/05/2021 REFERRING M.D.:  Carol Ada, MD Study Performed:   Baseline Polysomnogram HISTORY: 70 year old woman with a history of chronic combined systolic and diastolic congestive heart failure, chronic kidney disease, hypertension, hyperlipidemia, coronary artery disease with status post MI, status post stents, obesity, obstructive sleep apnea, (not on PAP therapy in at least 2 years, by self report), history of restless leg syndrome, thyroid disease, type 2 diabetes, arthritis, anemia, anxiety, retinopathy, reflux disease, migraine headaches, history of COVID, and depression, who reports difficulty with her memory since she was in the hospital with [facility]. The patient endorsed the Epworth Sleepiness Scale at 9 points. The patient's weight 187 pounds with a height of 60 (inches), resulting in a BMI of 37.3 kg/m2. The patient's neck circumference measured 15.6 inches.  CURRENT MEDICATIONS: Aspirin, Lipitor, Jardiance, Tricor, Zestril, magnesium, Glucophage, Vitamin D, Tylenol, Zetia, Imdur, Lopressor   PROCEDURE:  This is a multichannel digital polysomnogram utilizing the Somnostar 11.2 system.  Electrodes and sensors were applied and monitored per AASM Specifications.   EEG, EOG, Chin and Limb EMG, were sampled at 200 Hz.  ECG, Snore and Nasal Pressure, Thermal Airflow, Respiratory Effort, CPAP Flow and Pressure, Oximetry was sampled at 50 Hz. Digital video and audio were recorded.      BASELINE STUDY  Lights Out was at 20:33 and Lights On at 05:15.  Total recording time (TRT) was 523 minutes, with a total sleep time (TST) of 510 minutes.  The patient's sleep latency was 3 minutes.  REM latency was 99 minutes.  The sleep efficiency was 97.5 %.     SLEEP ARCHITECTURE: WASO (Wake after sleep onset) was 10 minutes with minimal to mild sleep fragmentation noted.  There were 2.5 minutes in Stage N1, 283  minutes Stage N2, 178 minutes Stage N3 and 46.5 minutes in Stage REM.  The percentage of Stage N1 was .5%, Stage N2 was 55.5%, Stage N3 was 34.9%, which is increased, and Stage R (REM sleep) was 9.1%, which is reduced.  The arousals were noted as: 14 were spontaneous, 0 were associated with PLMs, 5 were associated with respiratory events.  RESPIRATORY ANALYSIS:  There were a total of 189 respiratory events:  133 obstructive apneas, 4 central apneas and 13 mixed apneas with a total of 150 apneas and an apnea index (AI) of 17.6 /hour. There were 39 hypopneas with a hypopnea index of 4.6 /hour. The patient also had 0 respiratory event related arousals (RERAs).      The total APNEA/HYPOPNEA INDEX (AHI) was 22.2/hour and the total RESPIRATORY DISTURBANCE INDEX was  22.2 /hour.  1 events occurred in REM sleep and 95 events in NREM. The REM AHI was  1.3 /hour, versus a non-REM AHI of 24.3. The patient spent 403.5 minutes of total sleep time in the supine position and 107 minutes in non-supine.. The supine AHI was 24.0 versus a non-supine AHI of 15.8.  OXYGEN SATURATION & C02:  The Wake baseline 02 saturation was 95%, with the lowest being 87%. Time spent below 89% saturation equaled 3 minutes.  PERIODIC LIMB MOVEMENTS:   The patient had a total of 0 Periodic Limb Movements.  The Periodic Limb Movement (PLM) index was 0 and the PLM Arousal index was 0/hour.  Audio and video analysis did not show any abnormal or unusual movements, behaviors, phonations or vocalizations. The  patient took no bathroom breaks. Mild to moderate snoring was noted. The EKG was in keeping with normal sinus rhythm (NSR).  Post-study, the patient indicated that sleep was the same as usual.   IMPRESSION:  Obstructive Sleep Apnea (OSA) Dysfunctions associated with sleep stages or arousal from sleep  RECOMMENDATIONS:  This study demonstrates moderate obstructive sleep apnea, with a total AHI of 22.2/hour, supine AHI of 24/hour and  O2 nadir of 87%. Treatment with positive airway pressure in the form of CPAP is recommended. This will require a full night titration study to optimize therapy. Other treatment options may include avoidance of supine sleep position along with weight loss, upper airway or jaw surgery in selected patients or the use of an oral appliance in certain patients. ENT evaluation and/or consultation with a maxillofacial surgeon or dentist may be feasible in some instances.  Please note that untreated obstructive sleep apnea may carry additional perioperative morbidity. Patients with significant obstructive sleep apnea should receive perioperative PAP therapy and the surgeons and particularly the anesthesiologist should be informed of the diagnosis and the severity of the sleep disordered breathing. This study shows mild sleep fragmentation and abnormal sleep stage percentages; these are nonspecific findings and per se do not signify an intrinsic sleep disorder or a cause for the patient's sleep-related symptoms. Causes include (but are not limited to) the first night effect of the sleep study, circadian rhythm disturbances, medication effect or an underlying mood disorder or medical problem.  The patient should be cautioned not to drive, work at heights, or operate dangerous or heavy equipment when tired or sleepy. Review and reiteration of good sleep hygiene measures should be pursued with any patient. The patient will be seen in follow-up in the sleep clinic at [facility] for discussion of the test results, symptom and treatment compliance review, further management strategies, etc. The referring provider will be notified of the test results.  I certify that I have reviewed the entire raw data recording prior to the issuance of this report in accordance with the Standards of Accreditation of the American Academy of Sleep Medicine (AASM)    Star Age, MD, PhD Diplomat, American Board of Neurology and Sleep Medicine  (Neurology and Sleep Medicine)

## 2021-01-25 NOTE — Progress Notes (Signed)
See procedure note.

## 2021-01-25 NOTE — Addendum Note (Signed)
Addended by: Star Age on: 01/25/2021 06:42 PM   Modules accepted: Orders

## 2021-01-26 ENCOUNTER — Telehealth: Payer: Self-pay

## 2021-01-26 NOTE — Telephone Encounter (Signed)
-----   Message from Star Age, MD sent at 01/25/2021  6:42 PM EDT ----- Patient referred by Dr. Tamala Julian, seen by me on 11/21/20 for memory loss, at which time she reported a prior diagnosis of obstructive sleep apnea.  She was not on treatment at the time.  She had a diagnostic PSG on 01/05/2021 which indicated moderate obstructive sleep apnea.  Please advise patient that I recommend treatment for this in the form of CPAP. This will require -ideally -a repeat sleep study for proper titration and mask fitting and correct monitoring of the oxygen saturations. Please explain to patient. I have placed an order in the chart. Thanks.  Star Age, MD, PhD [facility])

## 2021-01-26 NOTE — Telephone Encounter (Signed)
I called pt's daughter Mardene Celeste ok per DPR). I advised pt that Dr. Rexene Alberts reviewed their sleep study results and found that Moderate osa and recommends that pt be treated with a cpap. Dr. Rexene Alberts recommends that pt return for a repeat sleep study in order to properly titrate the cpap and ensure a good mask fit. Pt is agreeable to returning for a titration study. I advised pt that our sleep lab will file with pt's insurance and call pt to schedule the sleep study when we hear back from the pt's insurance regarding coverage of this sleep study. Pt verbalized understanding of results. Pt had no questions at this time but was encouraged to call back if questions arise.   Daughter states foggy headed times are  are more prevalent along with word finding. Advised CPAP could help with these sx, if help is not noted once CPAP is started sx can be reassessed.

## 2021-02-01 DIAGNOSIS — E1122 Type 2 diabetes mellitus with diabetic chronic kidney disease: Secondary | ICD-10-CM | POA: Diagnosis not present

## 2021-02-01 DIAGNOSIS — I5022 Chronic systolic (congestive) heart failure: Secondary | ICD-10-CM | POA: Diagnosis not present

## 2021-02-01 DIAGNOSIS — E7800 Pure hypercholesterolemia, unspecified: Secondary | ICD-10-CM | POA: Diagnosis not present

## 2021-02-01 DIAGNOSIS — D509 Iron deficiency anemia, unspecified: Secondary | ICD-10-CM | POA: Diagnosis not present

## 2021-02-01 DIAGNOSIS — I2510 Atherosclerotic heart disease of native coronary artery without angina pectoris: Secondary | ICD-10-CM | POA: Diagnosis not present

## 2021-02-01 DIAGNOSIS — M8580 Other specified disorders of bone density and structure, unspecified site: Secondary | ICD-10-CM | POA: Diagnosis not present

## 2021-02-01 DIAGNOSIS — K219 Gastro-esophageal reflux disease without esophagitis: Secondary | ICD-10-CM | POA: Diagnosis not present

## 2021-02-01 DIAGNOSIS — I10 Essential (primary) hypertension: Secondary | ICD-10-CM | POA: Diagnosis not present

## 2021-02-01 DIAGNOSIS — I5032 Chronic diastolic (congestive) heart failure: Secondary | ICD-10-CM | POA: Diagnosis not present

## 2021-02-01 DIAGNOSIS — N183 Chronic kidney disease, stage 3 unspecified: Secondary | ICD-10-CM | POA: Diagnosis not present

## 2021-02-03 ENCOUNTER — Other Ambulatory Visit: Payer: Self-pay

## 2021-02-03 ENCOUNTER — Ambulatory Visit (INDEPENDENT_AMBULATORY_CARE_PROVIDER_SITE_OTHER): Payer: Medicare Other | Admitting: Neurology

## 2021-02-03 DIAGNOSIS — G4720 Circadian rhythm sleep disorder, unspecified type: Secondary | ICD-10-CM

## 2021-02-03 DIAGNOSIS — G4733 Obstructive sleep apnea (adult) (pediatric): Secondary | ICD-10-CM | POA: Diagnosis not present

## 2021-02-03 DIAGNOSIS — R413 Other amnesia: Secondary | ICD-10-CM

## 2021-02-03 DIAGNOSIS — Z8673 Personal history of transient ischemic attack (TIA), and cerebral infarction without residual deficits: Secondary | ICD-10-CM

## 2021-02-03 DIAGNOSIS — R4789 Other speech disturbances: Secondary | ICD-10-CM

## 2021-02-03 DIAGNOSIS — E669 Obesity, unspecified: Secondary | ICD-10-CM

## 2021-02-17 NOTE — Addendum Note (Signed)
Addended by: Star Age on: 02/17/2021 02:40 PM   Modules accepted: Orders

## 2021-02-17 NOTE — Procedures (Signed)
PATIENT'S NAME:  [name], [name] DOB:      [birth year]      MR#:    [id number]     DATE OF RECORDING: 02/03/2021 REFERRING M.D.:  Carol Ada, MD  Study Performed:   CPAP Titration  HISTORY: 70 year old woman with a history of chronic combined systolic and diastolic congestive heart failure, chronic kidney disease, hypertension, hyperlipidemia, coronary artery disease with status post MI, status post stents, obesity, restless leg syndrome, thyroid disease, type 2 diabetes, arthritis, anemia, anxiety, retinopathy, reflux disease, migraine headaches, history of COVID, and depression, who presents for a full night titration study. Her baseline sleep study from 01/05/21 showed moderate obstructive sleep apnea, with a total AHI of 22.2/hour, supine AHI of 24/hour and O2 nadir of 87%. The patient endorsed the Epworth Sleepiness Scale at 9 points. The patient's weight 187 pounds with a height of 60 (inches), resulting in a BMI of 36.8 kg/m2. The patient's neck circumference measured 15.6 inches.  CURRENT MEDICATIONS: Aspirin, Lipitor, Jardiance, Tricor, Zestril, magnesium, Glucophage, Vitaamin D, Tylenol, Zetia, Imdur, Lopressor.   PROCEDURE:  This is a multichannel digital polysomnogram utilizing the SomnoStar 11.2 system.  Electrodes and sensors were applied and monitored per AASM Specifications.   EEG, EOG, Chin and Limb EMG, were sampled at 200 Hz.  ECG, Snore and Nasal Pressure, Thermal Airflow, Respiratory Effort, CPAP Flow and Pressure, Oximetry was sampled at 50 Hz. Digital video and audio were recorded.      The patient was fitted with a small Eson 2 nasal mask. CPAP was initiated at 5 cm H20 with heated humidity per AASM standards and pressure was advanced to 10 cm H20 because of hypopneas, apneas and desaturations. At a PAP pressure of 10 cmH20, there was a reduction of the AHI to 3.1/hour with supine REM sleep achieved and O2 nadir of 92%.   Lights Out was at 21:55 and Lights On at 05:05. Total recording  time (TRT) was 431 minutes, with a total sleep time (TST) of 414.5 minutes. The patient's sleep latency was 16.5 minutes. REM latency was 45 minutes.  The sleep efficiency was 96.2%.    SLEEP ARCHITECTURE: WASO (Wake after sleep onset) was 4 minutes. There were 2.5 minutes in Stage N1, 202 minutes Stage N2, 116.5 minutes Stage N3 and 93.5 minutes in Stage REM.  The percentage of Stage N1 was .6%, Stage N2 was 48.7%, Stage N3 was 28.1% and Stage R (REM sleep) was 22.6%. The arousals were noted as: 19 were spontaneous, 2 were associated with PLMs, 10 were associated with respiratory events.  RESPIRATORY ANALYSIS:  There was a total of 43 respiratory events: 8 obstructive apneas, 5 central apneas and 13 mixed apneas with a total of 26 apneas and an apnea index (AI) of 3.8 /hour. There were 17 hypopneas with a hypopnea index of 2.5/hour. The patient also had 0 respiratory event related arousals (RERAs).      The total APNEA/HYPOPNEA INDEX  (AHI) was 6.2/hour and the total RESPIRATORY DISTURBANCE INDEX was 6.2 /hour  1 events occurred in REM sleep and 42 events in NREM. The REM AHI was .6/hour versus a non-REM AHI of 7.9 /hour.  The patient spent 381.5 minutes of total sleep time in the supine position and 33 minutes in non-supine. The supine AHI was 6.6, versus a non-supine AHI of 1.8.  OXYGEN SATURATION & C02:  The baseline 02 saturation was 95%, with the lowest being 88%. Time spent below 89% saturation equaled 0 minutes.  PERIODIC LIMB MOVEMENTS:  The patient had a total of 5 Periodic Limb Movements. The Periodic Limb Movement (PLM) index was .7 and the PLM Arousal index was .3 /hour.  Audio and video analysis did not show any abnormal or unusual movements, behaviors, phonations or vocalizations. The patient took no bathroom breaks. The EKG was in keeping with normal sinus rhythm (NSR).  Post-study, the patient indicated that sleep was the same as usual.   IMPRESSION:   Obstructive Sleep Apnea  (OSA)  RECOMMENDATIONS:   This study demonstrates near-resolution of the patient's obstructive sleep apnea with CPAP therapy. I will, therefore, start the patient on home CPAP treatment at a pressure of 10 cm via small nasal mask with (heated) humidity. The patient should be reminded to be fully compliant with PAP therapy to improve sleep related symptoms and decrease long term cardiovascular risks. The patient should be reminded, that it may take up to 3 months to get fully used to using PAP with all planned sleep. The earlier full compliance is achieved, the better long term compliance tends to be. Please note that untreated obstructive sleep apnea carries additional perioperative morbidity. Patients with significant obstructive sleep apnea should receive perioperative PAP therapy and the surgeons and particularly the anesthesiologist should be informed of the diagnosis and the severity of the sleep disordered breathing.  The patient should be cautioned not to drive, work at heights, or operate dangerous or heavy equipment when tired or sleepy. Review and reiteration of good sleep hygiene measures should be pursued with any patient. The patient will be seen in follow-up in the sleep clinic at [facility] for discussion of the test results, symptom and treatment compliance review, further management strategies, etc. The referring provider will be notified of the test results.   I certify that I have reviewed the entire raw data recording prior to the issuance of this report in accordance with the Standards of Accreditation of the American Academy of Sleep Medicine (AASM)   Star Age, MD, PhD Diplomat, American Board of Neurology and Sleep Medicine (Neurology and Sleep Medicine)

## 2021-02-21 ENCOUNTER — Encounter: Payer: Self-pay | Admitting: *Deleted

## 2021-02-21 ENCOUNTER — Telehealth: Payer: Self-pay | Admitting: *Deleted

## 2021-02-21 NOTE — Telephone Encounter (Signed)
error 

## 2021-02-21 NOTE — Telephone Encounter (Signed)
Pt returned call. Please call back when available. 

## 2021-02-21 NOTE — Telephone Encounter (Signed)
-----   Message from Star Age, MD sent at 02/17/2021  2:40 PM EDT ----- Patient referred by Dr. Tamala Julian, seen by me on 11/21/20 for memory loss, at which time she reported a prior diagnosis of obstructive sleep apnea.  She was not on treatment at the time.  She had a diagnostic PSG on 01/05/2021, and a CPAP titration study on 02/03/21.  Please call and inform patient that I have entered an order for treatment with positive airway pressure (PAP) treatment for obstructive sleep apnea (OSA). She did well during the latest sleep study with CPAP. We will, therefore, arrange for a machine for home use through a DME (durable medical equipment) company of Her choice; and I will see the patient back in follow-up in about 10 weeks. Please also explain to the patient that I will be looking out for compliance data, which can be downloaded from the machine (stored on an SD card, that is inserted in the machine) or via remote access through a modem, that is built into the machine. At the time of the followup appointment we will discuss sleep study results and how it is going with PAP treatment at home. Please advise patient to bring Her machine at the time of the first FU visit, even though this is cumbersome. Bringing the machine for every visit after that will likely not be needed, but often helps for the first visit to troubleshoot if needed. Please re-enforce the importance of compliance with treatment and the need for [place] to monitor compliance data - often an insurance requirement and actually good feedback for the patient as far as how they are doing.  Also remind patient, that any interim PAP machine or mask issues should be first addressed with the DME company, as they can often help better with technical and mask fit issues. Please ask if patient has a preference regarding DME company.  Please also make sure, the patient has a follow-up appointment with me in about 10 weeks from the setup date, thanks. May see one of our  nurse practitioners if needed for proper timing of the FU appointment.  Please fax or rout report to the referring provider. Thanks,   Star Age, MD, PhD [facility])

## 2021-02-21 NOTE — Telephone Encounter (Signed)
I returned the [name]'s call and discussed with [name] and daughter ([name]) the sleep study results per Dr. Rexene Alberts.  The [name] understands that she did well on CPAP during her sleep study and Dr. Rexene Alberts has ordered this for the [name] to use at home nightly.  Discussed we will send an order to a durable medical equipment company and they will arrange for home CPAP.  They will set her up with it, show her how to use it, fit her for a mask, and will be her first contact when troubleshooting any problems with the machine.  [name] is aware of compliance required by insurance of at least 4 hours per night and also provides good feedback to see how she is doing.  [name] aware of follow-up appointment is required by insurance and we have already scheduled back for Tuesday, November 29 at 8:30 AM arrival 8 AM.  [name] aware will need at least 31 days of data after she is set up on her machine.  If we will not have at least 31 days, she should contact her office so we can push out the appointment further.  I have asked the [name] to bring her machine and power cord with her so that we can ensure that we are able to collect data from the machine.  She does not have a preference of DME company so I let her know we will send the order to [employer] and they would give her a welcome call within a week and discuss benefit/cost information.  [name] confirmed her address so I can send her a letter summarizing our call. She verbalized appreciation for the call.  Letter mailed to pt. Results routed to Dr Tamala Julian. Secure message sent to [employer] regarding orders.

## 2021-02-21 NOTE — Telephone Encounter (Signed)
Called pt and LVM with office number asking for call back.  

## 2021-02-23 DIAGNOSIS — E1122 Type 2 diabetes mellitus with diabetic chronic kidney disease: Secondary | ICD-10-CM | POA: Diagnosis not present

## 2021-03-09 DIAGNOSIS — G4733 Obstructive sleep apnea (adult) (pediatric): Secondary | ICD-10-CM | POA: Diagnosis not present

## 2021-04-08 DIAGNOSIS — G4733 Obstructive sleep apnea (adult) (pediatric): Secondary | ICD-10-CM | POA: Diagnosis not present

## 2021-04-10 DIAGNOSIS — N183 Chronic kidney disease, stage 3 unspecified: Secondary | ICD-10-CM | POA: Diagnosis not present

## 2021-04-10 DIAGNOSIS — D509 Iron deficiency anemia, unspecified: Secondary | ICD-10-CM | POA: Diagnosis not present

## 2021-04-10 DIAGNOSIS — K219 Gastro-esophageal reflux disease without esophagitis: Secondary | ICD-10-CM | POA: Diagnosis not present

## 2021-04-10 DIAGNOSIS — E1122 Type 2 diabetes mellitus with diabetic chronic kidney disease: Secondary | ICD-10-CM | POA: Diagnosis not present

## 2021-04-10 DIAGNOSIS — M8580 Other specified disorders of bone density and structure, unspecified site: Secondary | ICD-10-CM | POA: Diagnosis not present

## 2021-04-10 DIAGNOSIS — I10 Essential (primary) hypertension: Secondary | ICD-10-CM | POA: Diagnosis not present

## 2021-04-10 DIAGNOSIS — E7800 Pure hypercholesterolemia, unspecified: Secondary | ICD-10-CM | POA: Diagnosis not present

## 2021-05-02 DIAGNOSIS — Z23 Encounter for immunization: Secondary | ICD-10-CM | POA: Diagnosis not present

## 2021-05-02 DIAGNOSIS — Z1211 Encounter for screening for malignant neoplasm of colon: Secondary | ICD-10-CM | POA: Diagnosis not present

## 2021-05-02 DIAGNOSIS — I10 Essential (primary) hypertension: Secondary | ICD-10-CM | POA: Diagnosis not present

## 2021-05-02 DIAGNOSIS — Z7984 Long term (current) use of oral hypoglycemic drugs: Secondary | ICD-10-CM | POA: Diagnosis not present

## 2021-05-02 DIAGNOSIS — E7800 Pure hypercholesterolemia, unspecified: Secondary | ICD-10-CM | POA: Diagnosis not present

## 2021-05-02 DIAGNOSIS — E1122 Type 2 diabetes mellitus with diabetic chronic kidney disease: Secondary | ICD-10-CM | POA: Diagnosis not present

## 2021-05-02 DIAGNOSIS — I2510 Atherosclerotic heart disease of native coronary artery without angina pectoris: Secondary | ICD-10-CM | POA: Diagnosis not present

## 2021-05-02 DIAGNOSIS — E041 Nontoxic single thyroid nodule: Secondary | ICD-10-CM | POA: Diagnosis not present

## 2021-05-02 DIAGNOSIS — N183 Chronic kidney disease, stage 3 unspecified: Secondary | ICD-10-CM | POA: Diagnosis not present

## 2021-05-03 ENCOUNTER — Other Ambulatory Visit: Payer: Self-pay | Admitting: Family Medicine

## 2021-05-03 DIAGNOSIS — E041 Nontoxic single thyroid nodule: Secondary | ICD-10-CM

## 2021-05-09 DIAGNOSIS — G4733 Obstructive sleep apnea (adult) (pediatric): Secondary | ICD-10-CM | POA: Diagnosis not present

## 2021-05-11 DIAGNOSIS — D509 Iron deficiency anemia, unspecified: Secondary | ICD-10-CM | POA: Diagnosis not present

## 2021-05-11 DIAGNOSIS — N1832 Chronic kidney disease, stage 3b: Secondary | ICD-10-CM | POA: Diagnosis not present

## 2021-05-18 DIAGNOSIS — E7800 Pure hypercholesterolemia, unspecified: Secondary | ICD-10-CM | POA: Diagnosis not present

## 2021-05-18 DIAGNOSIS — E1122 Type 2 diabetes mellitus with diabetic chronic kidney disease: Secondary | ICD-10-CM | POA: Diagnosis not present

## 2021-05-18 DIAGNOSIS — M8580 Other specified disorders of bone density and structure, unspecified site: Secondary | ICD-10-CM | POA: Diagnosis not present

## 2021-05-18 DIAGNOSIS — I2510 Atherosclerotic heart disease of native coronary artery without angina pectoris: Secondary | ICD-10-CM | POA: Diagnosis not present

## 2021-05-18 DIAGNOSIS — N1832 Chronic kidney disease, stage 3b: Secondary | ICD-10-CM | POA: Diagnosis not present

## 2021-05-18 DIAGNOSIS — I5032 Chronic diastolic (congestive) heart failure: Secondary | ICD-10-CM | POA: Diagnosis not present

## 2021-05-18 DIAGNOSIS — I5022 Chronic systolic (congestive) heart failure: Secondary | ICD-10-CM | POA: Diagnosis not present

## 2021-05-18 DIAGNOSIS — K219 Gastro-esophageal reflux disease without esophagitis: Secondary | ICD-10-CM | POA: Diagnosis not present

## 2021-05-18 DIAGNOSIS — I10 Essential (primary) hypertension: Secondary | ICD-10-CM | POA: Diagnosis not present

## 2021-05-22 ENCOUNTER — Other Ambulatory Visit: Payer: Medicare Other

## 2021-05-27 ENCOUNTER — Encounter: Payer: Self-pay | Admitting: Neurology

## 2021-05-30 ENCOUNTER — Encounter: Payer: Self-pay | Admitting: Neurology

## 2021-05-30 ENCOUNTER — Ambulatory Visit: Payer: Medicare Other | Admitting: Neurology

## 2021-05-30 VITALS — BP 95/63 | HR 72 | Ht 59.0 in | Wt 186.1 lb

## 2021-05-30 DIAGNOSIS — Z9989 Dependence on other enabling machines and devices: Secondary | ICD-10-CM

## 2021-05-30 DIAGNOSIS — G4733 Obstructive sleep apnea (adult) (pediatric): Secondary | ICD-10-CM | POA: Diagnosis not present

## 2021-05-30 DIAGNOSIS — R413 Other amnesia: Secondary | ICD-10-CM

## 2021-05-30 DIAGNOSIS — Z789 Other specified health status: Secondary | ICD-10-CM | POA: Diagnosis not present

## 2021-05-30 NOTE — Progress Notes (Signed)
Subjective:    [name] ID: [name] is a 70 y.o. female.  HPI    Interim history:   [name] is a 70 year old right-handed woman with an underlying complex medical history of chronic combined systolic and diastolic congestive heart failure, chronic kidney disease, hypertension, hyperlipidemia, coronary artery disease with status post MI, status post stents, obesity, obstructive sleep apnea, (not on PAP therapy in at least 2 years, by self report), history of restless leg syndrome, thyroid disease, type 2 diabetes, arthritis, anemia, anxiety, retinopathy, reflux disease, migraine headaches, history of COVID, and depression, who presents for follow-up consultation of memory disturbance and sleep apnea, after starting CPAP therapy recently.  [name] is unaccompanied today.  [name] first met [name] at Kelli request of [name] primary care physician on 11/21/2020, at which time [name] reported difficulty with [name] memory since [name] was hospitalized with COVID.  [name] MMSE was benign at Kelli time.  [name] did not report any strong family history of Alzheimer's type dementia.  [name] does have several vascular risk factors and we talked about reevaluation for [name] sleep apnea at Kelli time as [name] was not on treatment for years.  [name] was advised to proceed with a sleep study.  [name] had a baseline sleep study on 01/05/2021 which showed moderate obstructive sleep apnea, with a total AHI of 22.2/hour, supine AHI of 24/hour and O2 nadir of 87%. [name] was advised to return for a CPAP titration study.  [name] had this test on 02/03/2021.  Sleep efficiency was 96.2%, sleep latency 16.5 minutes, REM latency 45 minutes.  [name] had an increased percentage of slow-wave sleep, and a normal percentage of REM sleep.  [name] was fitted with a small nasal mask and titrated from 5 cm to 10 cm.  On Kelli final pressure [name] AHI was 3.1/h, O2 nadir 92%.  [name] was CPAP therapy.  [name] set up date was 03/09/21. [name] has a Personnel officer.  Today, 05/30/2021: [name] reviewed [name]  CPAP compliance data from 04/28/2021 through 03/27/2021, which is a total of 30 days, during which time [name] used [name] machine 27 days with percent use days greater than 4 hours and 36.7%, indicating suboptimal compliance with an average usage of 3 hours and 1 minute.  Residual AHI at goal at 1.9/h, leak on Kelli high side with Kelli average leak at 80.8 L/min.  [name] has been on treatment for Kelli past 80+ days.  [name] reports feeling no significant difference with [name] CPAP.  Memory is stable, forgetfulness is stable and word finding difficulty is also stable, [name] feels no worse.  [name] has not had any recent falls but feels like [name] could topple over when [name] bends down to pick up something or puts down something.  [name] tries to hydrate well with water and likes ice water.  [name] drinks caffeine in Kelli form of diet Dr. Malachi Bonds, about 2 bottles per day. [name] does not sleep very much, goes to bed around 8:30 p.m. but may be awake around 3 AM.  [name] has a TV on in [name] bedroom and it tends to be on all night, [name] reports that when Kelli TV shuts off [name] tends to wake up.  [name] is aware that [name] may not be able to fulfill insurance criteria for compliance but is motivated to continue with treatment and do [name] best.  [name] would like to be able to continue treatment if possible.  [name] is working on lifestyle changes.  [name]'s allergies, current medications, family  history, past medical history, past social history, past surgical history and problem list were reviewed and updated as appropriate.   Previously:   11/21/20: ([name]) reports difficulty with [name] memory since [name] was in [facility] hospital with [facility].  [name] feels that [name] symptoms have plateaued, not progressive, not necessarily any better either.  [name] has had difficulty with comprehension and word finding difficulty.  [name] has not had any confusion or disorientation, no recent falls but does feel wobbly.  [name] had used a walker, has bilateral hip pain and was told that this is bursitis.   [name] does not have a strong family history of dementia but recalls that [name] maternal grandmother had dementia.  Neither parents had dementia, [name] has 1 full brother who is 8 years younger and one half brother on mom side and several half siblings on dad side, all younger.   [name] has 3 children, lives with [name] husband, 8 grandchildren and 6 great-grandchildren.  [name] quit smoking some 20 years ago, does not drink alcohol and drinks about 4 cups of water per day on average, 2 bottles of diet Dr. Malachi Bonds, 16.9 ounce size each.   [name] has not used [name] CPAP or positive airway pressure machine in over 2 years.  [name] reports that they had bedbugs and packed up Kelli machine so they could get their rooms sprayed and [name] never unpacked Kelli machine.  Sleep testing by [name] recollection was over 5 years ago.  [name] believes that [name] had mild sleep apnea.  Weight has been more or less stable.  Epworth sleepiness score is 9 out of 24.   [name] reviewed your office note from 09/19/2020.  [name] reports memory issues since [name] COVID infection.  [name] had blood work through your office including A1c, CMP, lipid panel, vitamin B12.  [name] A1c was elevated at 7.9, blood sugar 164, BUN 31, creatinine 1.17.  Lipid panel showed total cholesterol of 126, triglycerides 217, HDL 36, LDL 55, B12 870.  [name] was hospitalized and August 2021 due to left-sided weakness.  [name] presented to Kelli emergency room via EMS.  [name] had tested positive for COVID in August 2021.  Upon admission to [facility] hospital on 02/17/2020 [name] was noted to have bilateral multifocal infiltrates on chest x-ray, creatinine was elevated at 2.4, [name] was requiring supplemental oxygen.  [name] was treated with remdesivir for total 5 days and steroids, had stroke work-up including CT angiogram of Kelli head and neck.  [name] also had a brain MRI.  Head CT without contrast on 02/17/2020 showed: Impression: No acute finding.  Remote lacunar infarct at Kelli right thalamus.   [name] had a CT angiogram of Kelli head  and neck on 02/17/2020 and [name] reviewed Kelli results:  IMPRESSION: 1. No emergent finding including large vessel occlusion. 2. No proximal flow limiting stenosis or embolic source. 3. These at least moderate left P2 segment stenosis. 4. Patchy bilateral pneumonia. 5. Nodular thyroid. Recommend thyroid US if appropriate for comorbidities. (Ref: J Am Coll Radiol. 2015 Feb;12(2): 143-50).   [name] had a brain MRI without contrast on 02/18/2020 and [name] reviewed Kelli results: IMPRESSION: No evidence of recent infarction, hemorrhage, or mass.   Chronic microvascular ischemic changes. Small chronic right thalamic infarct.    A partially empty sella was noted.   [name] had a thyroid nodule biopsy recently.   [name] is on baby aspirin.  [name] is on atorvastatin 80 mg daily and Zetia.  [name] is not sure if [name] is also taking Tricor,  [name] believes that [name] is no longer taking it.   [name] is participating in a diabetes study and is on a weekly injectable throughout Kelli study. [name] Past Medical History Is Significant For: Past Medical History:  Diagnosis Date   Anemia    "[name] see dr. Burr Medico @ [facility]; [name]'ve had transfusion for low iron (04/17/2016)   Anxiety    Arthritis    "left hip, fingers"  (04/17/2016)   At risk for medication noncompliance    Atypical chest pain    a. atypical symptoms since prior cath.   CAD (coronary artery disease)    a. s/p 2007  LAD stent. b. s/p 07/2006 CABG with LIMA-LAD & SVG to Diag. c. 12/2006 diagonal branch w/ 2.5 x 12 mm Endeavor stent. d. s/p 2010 cath with small vessel disease treated with RX, stable by repeat 2015. e. CP/abnl nuc -> LHC 04/17/16: new ostial stenosis of LIMA-LAD s/p DES, EF 45%, chronically occ SVG-D2. f. Stable cath 12/2016.   Chronic combined systolic and diastolic CHF (congestive heart failure) (HCC)    CKD (chronic kidney disease), stage II    Depression    GERD (gastroesophageal reflux disease)    Headache(784.0)    "maybe once/month" (04/17/2016)    Hyperlipidemia    Hypertension    Migraines    "couple times/yr"  ((04/17/2016)   Myocardial infarction [facility]) ?2008   Obesity    OSA on CPAP    "suppose to wear mask; can't find it" (10/17//2017)   Osteopenia 12/2010 &01/21/2013   Minimal of hip DEXA    Pneumonia    "several years" (04/17/2016)   Retinopathy 12/08   right   RLS (restless legs syndrome)    Thyroid disease    "blood work didn't show it but my dr said he can tell by looking at me; [name] had radiation" (04/17/2016)   Type II diabetes mellitus ([place])     [name] Past Surgical History Is Significant For: Past Surgical History:  Procedure Laterality Date   CARDIAC CATHETERIZATION  02/03/2008   for CP patent LIMA w mid-vessel spasm.   CARDIAC CATHETERIZATION N/A 04/17/2016   Procedure: Left Heart Cath and Cors/Grafts Angiography;  Surgeon: Peter M Martinique, MD;  Location: [place] [facility];  Service: Cardiovascular;  Laterality: N/A;   CARDIAC CATHETERIZATION N/A 04/17/2016   Procedure: Coronary Stent Intervention;  Surgeon: Peter M Martinique, MD;  Location: [place] [facility];  Service: Cardiovascular;  Laterality: N/A;   COLONOSCOPY  10/2010   rectal polyp   CORONARY ANGIOPLASTY WITH STENT PLACEMENT     "[name]'ve had quite a few stents"    CORONARY ANGIOPLASTY WITH STENT PLACEMENT  04/17/2016   CORONARY ARTERY BYPASS GRAFT  ?2008   s/p two vessel CABG off pump LAD diagonal and PTCA/DE Stent mid LAD/ DE stent LAD diagonal through previous stent sstruts,occluded SVG-diagonal   LAPAROSCOPIC CHOLECYSTECTOMY  1990's   LEFT HEART CATH AND CORS/GRAFTS ANGIOGRAPHY N/A 12/31/2016   Procedure: Left Heart Cath and Cors/Grafts Angiography;  Surgeon: Martinique, Peter M, MD;  Location: [place] [facility];  Service: Cardiovascular;  Laterality: N/A;   LEFT HEART CATHETERIZATION WITH CORONARY ANGIOGRAM N/A 07/15/2013   Procedure: LEFT HEART CATHETERIZATION WITH CORONARY ANGIOGRAM;  Surgeon: Peter M Martinique, MD;  Location[facility] C[facility];  Service: Cardiovascular;   Laterality: N/A;   TUBAL LIGATION  1976    [name] Family History Is Significant For: Family History  Problem Relation Age of Onset   Hypertension Mother  Cancer Mother        Lung   Diabetes Mother    Cancer Father        Lung   Diabetes Maternal Grandmother    Alzheimer's disease Maternal Grandmother    Sleep apnea Son     [name] Social History Is Significant For: Social History   Socioeconomic History   Marital status: Married    Spouse name: Not on file   Number of children: Not on file   Years of education: Not on file   Highest education level: Not on file  Occupational History   Not on file  Tobacco Use   Smoking status: Former    Years: 25.00    Types: Cigarettes    Quit date: 07/02/1989    Years since quitting: 31.9   Smokeless tobacco: Never   Tobacco comments:    "smoked 2-3 cigarettes/week"  Substance and Sexual Activity   Alcohol use: No   Drug use: No   Sexual activity: Yes  Other Topics Concern   Not on file  Social History Narrative   Not on file   Social Determinants of Health   Financial Resource Strain: Not on file  Food Insecurity: Not on file  Transportation Needs: Not on file  Physical Activity: Not on file  Stress: Not on file  Social Connections: Not on file    [name] Allergies Are:  No Known Allergies:   [name] Current Medications Are:  Outpatient Encounter Medications as of 05/30/2021  Medication Sig   acetaminophen (TYLENOL) 500 MG tablet Take 2 tablets (1,000 mg total) by mouth every 6 (six) hours as needed for moderate pain or headache.   aspirin EC 81 MG tablet Take 1 tablet (81 mg total) by mouth daily.   atorvastatin (LIPITOR) 80 MG tablet Take 80 mg by mouth daily.   empagliflozin (JARDIANCE) 10 MG TABS tablet Take by mouth daily.   ezetimibe (ZETIA) 10 MG tablet TAKE 1 TABLET BY MOUTH  DAILY   fenofibrate (TRICOR) 48 MG tablet TAKE 1 TABLET BY MOUTH  DAILY   lisinopril (ZESTRIL) 5 MG tablet Take 5 mg by mouth daily.    Magnesium 250 MG TABS Take by mouth.   metFORMIN (GLUCOPHAGE) 1000 MG tablet TAKE 1 TABLET BY MOUTH 2 TIMES DAILY WITH A MEAL. ([name] taking differently: Take 500 mg by mouth 2 (two) times daily with a meal.)   VITAMIN D PO Take 50 mcg by mouth.   isosorbide mononitrate (IMDUR) 60 MG 24 hr tablet Take 1 tablet (60 mg total) by mouth daily.   metoprolol tartrate (LOPRESSOR) 25 MG tablet Take 1 tablet (25 mg total) by mouth 2 (two) times daily.   No facility-administered encounter medications on file as of 05/30/2021.  :  Review of Systems:  Out of a complete 14 point review of systems, all are reviewed and negative with Kelli exception of these symptoms as listed below:  Review of Systems  Neurological:        Pt is here for Folow up CPAP [name] has no questions or concerns for this visit .    Objective:  Neurological Exam  Physical Exam Physical Examination:   Vitals:   05/30/21 0844  BP: 95/63  Pulse: 72    General Examination: [name] is a very pleasant 70 y.o. female in no acute distress. [name] appears well-developed and well-nourished and well groomed.   HEENT: Normocephalic, atraumatic, pupils are equal, round and reactive to light, extraocular tracking is well-preserved, hearing  is grossly intact. Face is symmetric with normal facial animation and normal facial sensation to light touch, temperature, and vibration. Speech is clear with no dysarthria noted. There is no hypophonia. There is no lip, neck/head, jaw or voice tremor. Neck is supple with full range of passive and active motion. There are no carotid bruits on auscultation. Oropharynx exam reveals: mild to moderate mouth dryness, adequate dental hygiene and mild airway crowding.  Tongue protrudes centrally and palate elevates symmetrically.  Chest: Clear to auscultation without wheezing, rhonchi or crackles noted.   Heart: S1+S2+0, regular and normal without murmurs, rubs or gallops noted.    Abdomen: Soft,  non-tender and non-distended.   Extremities: There is trace pitting edema in Kelli distal lower extremities bilaterally.   Skin: Warm and dry without trophic changes noted.   Musculoskeletal: exam reveals no obvious joint deformities.    Neurologically:  Mental status: [name] is awake, alert and oriented in all 4 spheres. [name] immediate and remote memory, attention, language skills and fund of knowledge are appropriate.   On 11/21/2020: MMSE: 29/30, CDT: 4/4, AFT: 6/min.  There is no evidence of aphasia, agnosia, apraxia or anomia. Speech is clear with normal prosody and enunciation. Thought process is linear. Mood is normal and affect is normal.  Cranial nerves II - XII are as described above under HEENT exam.  Motor exam: Normal bulk, strength and tone is noted. There is no drift, tremor or rebound. Fine motor skills and coordination: grossly intact with normal finger taps, normal hand movements, normal rapid alternating patting, normal foot taps and normal foot agility.  Cerebellar testing: No dysmetria or intention tremor. Heel to shin is unremarkable bilaterally. There is no truncal or gait ataxia.  Sensory exam: intact to light touch in Kelli upper and lower extremities.  Gait, station and balance: [name] stands with mild difficulty and pushes herself up.  [name] stands naturally wider base. Romberg is not tested due to safety concerns. [name] walks slightly wider based, left foot is turned out with toes pointing outwards. [name] walks somewhat slowly and insecurely.     Assessment and Plan:    In summary, Marshay Rash Leeman is a very pleasant 70 year old female with an underlying complex medical history of chronic combined systolic and diastolic congestive heart failure, chronic kidney disease, hypertension, hyperlipidemia, prior stroke, coronary artery disease with status post MI, status post stents, obesity, obstructive sleep apnea, restless leg syndrome, thyroid disease, type 2 diabetes, arthritis,  anemia, anxiety, retinopathy, reflux disease, migraine headaches, history of COVID, and depression, who presents for follow-up consultation of [name] memory disturbance.  [name] has had memory related symptoms since August 2021 when [name] was hospitalized for COVID.  [name] feels plateaued and has not had any progression, [name] memory scores earlier this year were relatively benign.  [name] has a history of sleep apnea and vascular risk factors and we discussed these again today.  [name] had interim sleep testing, baseline sleep study in July 2022 indicated moderate obstructive sleep apnea [name] had good results with CPAP when [name] came back for a titration study in August 2022.  [name] was set up with CPAP therapy at home in September 2022.  [name] is using [name] machine nightly but does not keep it on for more than 4 hours on most nights unfortunately.  [name] has had trouble sleeping and he often wakes up in Kelli early morning hours.  [name] is motivated to continue therapy with CPAP and is encouraged to be consistent with its  usage, [name] is currently averaging between 2-1/2 hours to 3 hours on any given night.  [name] does have a higher leak from [name] fullface mask and [name] suggested we seek a mask refit appointment through [name] DME company.  [name] is agreeable to this.  [name] is advised to follow-up routinely in this clinic in 3 to 4 months to see one of our nurse practitioners.  We will consider repeating [name] MMSE at Kelli time.  [name] is again reminded regarding Kelli compliance criteria that most insurances have.  Again, [name] is very motivated to work on [name] compliance.  [name] answered all [name] questions today and [name] was in agreement with our plan.   [name] spent 30 minutes in total face-to-face time and in reviewing records during pre-charting, more than 50% of which was spent in counseling and coordination of care, reviewing test results, reviewing medications and treatment regimen and/or in discussing or reviewing Kelli diagnosis of memory disturbance, OSA, Kelli prognosis  and treatment options. Pertinent laboratory and imaging test results that were available during this visit with [name] were reviewed by me and considered in my medical decision making (see chart for details).

## 2021-05-30 NOTE — Patient Instructions (Addendum)
Please continue using your CPAP regularly. While your insurance requires that you use CPAP at least 4 hours each night on 70% of the nights, I recommend, that you not skip any nights and use it throughout the night if you can. Getting used to CPAP and staying with the treatment long term does take time and patience and discipline. Untreated obstructive sleep apnea when it is moderate to severe can have an adverse impact on cardiovascular health and raise her risk for heart disease, arrhythmias, hypertension, congestive heart failure, stroke and diabetes. Untreated obstructive sleep apnea causes sleep disruption, nonrestorative sleep, and sleep deprivation. This can have an impact on your day to day functioning and cause daytime sleepiness and impairment of cognitive function, memory loss, mood disturbance, and problems focussing. Using CPAP regularly can improve these symptoms.  As discussed, you are not quite there yet when it comes to insurance compliance criteria.  Your average usage of your CPAP machine currently is about 2 and half hours.  Please try to use your machine consistently throughout the night for at least 4 hours each night.  Follow-up in 3 to 4 months to see one of our nurse practitioners in monitor your memory.  I would like to hear that you feel no worse with your memory.

## 2021-06-01 ENCOUNTER — Encounter: Payer: Self-pay | Admitting: Hematology

## 2021-06-01 ENCOUNTER — Ambulatory Visit
Admission: RE | Admit: 2021-06-01 | Discharge: 2021-06-01 | Disposition: A | Discharge status: Home / Self Care | Payer: Medicare Other | Source: Ambulatory Visit | Attending: Family Medicine | Admitting: Family Medicine

## 2021-06-01 DIAGNOSIS — E041 Nontoxic single thyroid nodule: Secondary | ICD-10-CM

## 2021-06-01 DIAGNOSIS — R710 Precipitous drop in hematocrit: Secondary | ICD-10-CM | POA: Diagnosis not present

## 2021-06-08 DIAGNOSIS — E7800 Pure hypercholesterolemia, unspecified: Secondary | ICD-10-CM | POA: Diagnosis not present

## 2021-06-15 DIAGNOSIS — G4733 Obstructive sleep apnea (adult) (pediatric): Secondary | ICD-10-CM | POA: Diagnosis not present

## 2021-06-20 DIAGNOSIS — E1122 Type 2 diabetes mellitus with diabetic chronic kidney disease: Secondary | ICD-10-CM | POA: Diagnosis not present

## 2021-06-20 DIAGNOSIS — Z0000 Encounter for general adult medical examination without abnormal findings: Secondary | ICD-10-CM | POA: Diagnosis not present

## 2021-06-20 DIAGNOSIS — Z1211 Encounter for screening for malignant neoplasm of colon: Secondary | ICD-10-CM | POA: Diagnosis not present

## 2021-06-20 DIAGNOSIS — R609 Edema, unspecified: Secondary | ICD-10-CM | POA: Diagnosis not present

## 2021-06-20 DIAGNOSIS — Z1389 Encounter for screening for other disorder: Secondary | ICD-10-CM | POA: Diagnosis not present

## 2021-06-20 DIAGNOSIS — Z7984 Long term (current) use of oral hypoglycemic drugs: Secondary | ICD-10-CM | POA: Diagnosis not present

## 2021-06-20 DIAGNOSIS — E7800 Pure hypercholesterolemia, unspecified: Secondary | ICD-10-CM | POA: Diagnosis not present

## 2021-06-20 DIAGNOSIS — N1832 Chronic kidney disease, stage 3b: Secondary | ICD-10-CM | POA: Diagnosis not present

## 2021-06-20 DIAGNOSIS — I10 Essential (primary) hypertension: Secondary | ICD-10-CM | POA: Diagnosis not present

## 2021-06-22 DIAGNOSIS — K219 Gastro-esophageal reflux disease without esophagitis: Secondary | ICD-10-CM | POA: Diagnosis not present

## 2021-06-22 DIAGNOSIS — N1832 Chronic kidney disease, stage 3b: Secondary | ICD-10-CM | POA: Diagnosis not present

## 2021-06-22 DIAGNOSIS — I5032 Chronic diastolic (congestive) heart failure: Secondary | ICD-10-CM | POA: Diagnosis not present

## 2021-06-22 DIAGNOSIS — E1122 Type 2 diabetes mellitus with diabetic chronic kidney disease: Secondary | ICD-10-CM | POA: Diagnosis not present

## 2021-06-22 DIAGNOSIS — I5022 Chronic systolic (congestive) heart failure: Secondary | ICD-10-CM | POA: Diagnosis not present

## 2021-06-22 DIAGNOSIS — E7800 Pure hypercholesterolemia, unspecified: Secondary | ICD-10-CM | POA: Diagnosis not present

## 2021-06-22 DIAGNOSIS — I10 Essential (primary) hypertension: Secondary | ICD-10-CM | POA: Diagnosis not present

## 2021-07-28 DIAGNOSIS — I2510 Atherosclerotic heart disease of native coronary artery without angina pectoris: Secondary | ICD-10-CM | POA: Diagnosis not present

## 2021-07-28 DIAGNOSIS — I5022 Chronic systolic (congestive) heart failure: Secondary | ICD-10-CM | POA: Diagnosis not present

## 2021-07-28 DIAGNOSIS — E1122 Type 2 diabetes mellitus with diabetic chronic kidney disease: Secondary | ICD-10-CM | POA: Diagnosis not present

## 2021-07-28 DIAGNOSIS — I10 Essential (primary) hypertension: Secondary | ICD-10-CM | POA: Diagnosis not present

## 2021-07-28 DIAGNOSIS — I5032 Chronic diastolic (congestive) heart failure: Secondary | ICD-10-CM | POA: Diagnosis not present

## 2021-07-28 DIAGNOSIS — E7800 Pure hypercholesterolemia, unspecified: Secondary | ICD-10-CM | POA: Diagnosis not present

## 2021-08-09 ENCOUNTER — Other Ambulatory Visit: Payer: Self-pay | Admitting: Cardiology

## 2021-08-13 ENCOUNTER — Telehealth: Payer: Self-pay | Admitting: Family Medicine

## 2021-08-13 NOTE — Telephone Encounter (Signed)
Entered in error

## 2021-08-17 DIAGNOSIS — E7800 Pure hypercholesterolemia, unspecified: Secondary | ICD-10-CM | POA: Diagnosis not present

## 2021-08-17 DIAGNOSIS — I10 Essential (primary) hypertension: Secondary | ICD-10-CM | POA: Diagnosis not present

## 2021-08-17 DIAGNOSIS — E1122 Type 2 diabetes mellitus with diabetic chronic kidney disease: Secondary | ICD-10-CM | POA: Diagnosis not present

## 2021-09-28 ENCOUNTER — Ambulatory Visit (INDEPENDENT_AMBULATORY_CARE_PROVIDER_SITE_OTHER): Payer: Medicare Other | Admitting: Adult Health

## 2021-09-28 ENCOUNTER — Encounter: Payer: Self-pay | Admitting: Adult Health

## 2021-09-28 VITALS — BP 110/64 | HR 80 | Ht 59.0 in | Wt 196.1 lb

## 2021-09-28 DIAGNOSIS — Z9989 Dependence on other enabling machines and devices: Secondary | ICD-10-CM

## 2021-09-28 DIAGNOSIS — R413 Other amnesia: Secondary | ICD-10-CM | POA: Diagnosis not present

## 2021-09-28 DIAGNOSIS — G4733 Obstructive sleep apnea (adult) (pediatric): Secondary | ICD-10-CM

## 2021-09-28 NOTE — Patient Instructions (Signed)
Restart CPAP therapy- order sent to DME company ? ?

## 2021-09-28 NOTE — Progress Notes (Signed)
? ? ?PATIENT: [name] ?DOB: [birth year] ? ?REASON FOR VISIT: follow up ?HISTORY FROM: patient ?PRIMARY NEUROLOGIST: Dr. Rexene Alberts ? ?Chief Complaint  ?Patient presents with  ? Follow-up  ?  Pt in 4 pt is here for CPAP follow up  Pt states she was using her CPAP machine and her mask wasn't fitting right . Pt states she got the masked fitted and was using CPAP but her DME called her and said she was not using machine correctly and they took the CPAP machine back  Pt does want to start again with CPAP   ? ? ? ?HISTORY OF PRESENT ILLNESS: ?Today 09/28/21: ? ?Ms. Care is a 71 year old female with a history of obstructive sleep apnea and memory disturbance.  She returns today for follow-up.  She reports that she was using her CPAP unsure how long she was using it.  She states that she typically goes to bed around 11 PM and is up by 3 to 4 AM.  She states the DME took her machine back.  She would like to get restarted back on CPAP. ? ?Memory: Feels that memory is better. Lives with her husband, daughter and grandchildren. Able to complete all ADLS independently. recently started back driving. Stopped driving after getting covid due to feeling foggy headed. Manges her own medications, appointments, fiances.  ? ? ?05/30/2021: I reviewed her CPAP compliance data from 04/28/2021 through 03/27/2021, which is a total of 30 days, during which time she used her machine 27 days with percent use days greater than 4 hours and 36.7%, indicating suboptimal compliance with an average usage of 3 hours and 1 minute.  Residual AHI at goal at 1.9/h, leak on the high side with the average leak at 80.8 L/min.  She has been on treatment for the past 80+ days.  She reports feeling no significant difference with her CPAP.  Memory is stable, forgetfulness is stable and word finding difficulty is also stable, she feels no worse.  She has not had any recent falls but feels like she could topple over when she bends down to pick up something or  puts down something.  She tries to hydrate well with water and likes ice water.  She drinks caffeine in the form of diet Dr. Malachi Bonds, about 2 bottles per day. She does not sleep very much, goes to bed around 8:30 p.m. but may be awake around 3 AM.  She has a TV on in her bedroom and it tends to be on all night, she reports that when the TV shuts off she tends to wake up.  She is aware that she may not be able to fulfill insurance criteria for compliance but is motivated to continue with treatment and do her best.  She would like to be able to continue treatment if possible.  She is working on lifestyle changes. ? ? ?REVIEW OF SYSTEMS: Out of a complete 14 system review of symptoms, the patient complains only of the following symptoms, and all other reviewed systems are negative. ? ?FSS 23 ?ESS 6 ? ?ALLERGIES: ?No Known Allergies ? ?HOME MEDICATIONS: ?Outpatient Medications Prior to Visit  ?Medication Sig Dispense Refill  ? acetaminophen (TYLENOL) 500 MG tablet Take 2 tablets (1,000 mg total) by mouth every 6 (six) hours as needed for moderate pain or headache.    ? aspirin EC 81 MG tablet Take 1 tablet (81 mg total) by mouth daily.    ? atorvastatin (LIPITOR) 80 MG tablet Take 80 mg by  mouth daily.    ? empagliflozin (JARDIANCE) 10 MG TABS tablet Take by mouth daily.    ? Empagliflozin-linaGLIPtin (GLYXAMBI) 25-5 MG TABS Take by mouth daily.    ? ezetimibe (ZETIA) 10 MG tablet Take 1 tablet (10 mg total) by mouth daily. Please make overdue appt with Dr Radford Pax before anymore refills. Thank you 2nd attempt 15 tablet 0  ? furosemide (LASIX) 20 MG tablet     ? glimepiride (AMARYL) 4 MG tablet Take 4 mg by mouth 2 (two) times daily.    ? lisinopril (ZESTRIL) 5 MG tablet Take 5 mg by mouth daily.    ? Magnesium 250 MG TABS Take by mouth.    ? metFORMIN (GLUCOPHAGE) 1000 MG tablet TAKE 1 TABLET BY MOUTH 2 TIMES DAILY WITH A MEAL. (Patient taking differently: Take 500 mg by mouth 2 (two) times daily with a meal.) 60 tablet 2   ? VITAMIN D PO Take 50 mcg by mouth.    ? fenofibrate (TRICOR) 48 MG tablet TAKE 1 TABLET BY MOUTH  DAILY 15 tablet 0  ? isosorbide mononitrate (IMDUR) 60 MG 24 hr tablet Take 1 tablet (60 mg total) by mouth daily. 90 tablet 3  ? metoprolol tartrate (LOPRESSOR) 25 MG tablet Take 1 tablet (25 mg total) by mouth 2 (two) times daily. 180 tablet 3  ? ?No facility-administered medications prior to visit.  ? ? ?PAST MEDICAL HISTORY: ?Past Medical History:  ?Diagnosis Date  ? Anemia   ? "I see dr. Burr Medico @ [place]; I've had transfusion for low iron (04/17/2016)  ? Anxiety   ? Arthritis   ? "left hip, fingers"  (04/17/2016)  ? At risk for medication noncompliance   ? Atypical chest pain   ? a. atypical symptoms since prior cath.  ? CAD (coronary artery disease)   ? a. s/p 2007  LAD stent. b. s/p 07/2006 CABG with LIMA-LAD & SVG to Diag. c. 12/2006 diagonal branch w/ 2.5 x 12 mm Endeavor stent. d. s/p 2010 cath with small vessel disease treated with RX, stable by repeat 2015. e. CP/abnl nuc -> LHC 04/17/16: new ostial stenosis of LIMA-LAD s/p DES, EF 45%, chronically occ SVG-D2. f. Stable cath 12/2016.  ? Chronic combined systolic and diastolic CHF (congestive heart failure) ([place])   ? CKD (chronic kidney disease), stage II   ? Depression   ? GERD (gastroesophageal reflux disease)   ? Headache(784.0)   ? "maybe once/month" (04/17/2016)  ? Hyperlipidemia   ? Hypertension   ? Migraines   ? "couple times/yr"  ((04/17/2016)  ? Myocardial infarction [facility]) ?2008  ? Obesity   ? OSA on CPAP   ? "suppose to wear mask; can't find it" (10/17//2017)  ? Osteopenia 12/2010 &01/21/2013  ? Minimal of hip DEXA   ? Pneumonia   ? "several years" (04/17/2016)  ? Retinopathy 12/08  ? right  ? RLS (restless legs syndrome)   ? Thyroid disease   ? "blood work didn't show it but my dr said he can tell by looking at me; I had radiation" (04/17/2016)  ? Type II diabetes mellitus ([place])   ? ? ?PAST SURGICAL HISTORY: ?Past Surgical History:  ?Procedure  Laterality Date  ? CARDIAC CATHETERIZATION  02/03/2008  ? for CP patent LIMA w mid-vessel spasm.  ? CARDIAC CATHETERIZATION N/A 04/17/2016  ? Procedure: Left Heart Cath and Cors/Grafts Angiography;  Surgeon: Peter M Martinique, MD;  Location: [place] [facility];  Service: Cardiovascular;  Laterality: N/A;  ? CARDIAC  CATHETERIZATION N/A 04/17/2016  ? Procedure: Coronary Stent Intervention;  Surgeon: Peter M Martinique, MD;  Location: [place] [facility];  Service: Cardiovascular;  Laterality: N/A;  ? COLONOSCOPY  10/2010  ? rectal polyp  ? CORONARY ANGIOPLASTY WITH STENT PLACEMENT    ? "I've had quite a few stents"   ? CORONARY ANGIOPLASTY WITH STENT PLACEMENT  04/17/2016  ? CORONARY ARTERY BYPASS GRAFT  ?2008  ? s/p two vessel CABG off pump LAD diagonal and PTCA/DE Stent mid LAD/ DE stent LAD diagonal through previous stent sstruts,occluded SVG-diagonal  ? LAPAROSCOPIC CHOLECYSTECTOMY  1990's  ? LEFT HEART CATH AND CORS/GRAFTS ANGIOGRAPHY N/A 12/31/2016  ? Procedure: Left Heart Cath and Cors/Grafts Angiography;  Surgeon: Martinique, Peter M, MD;  Location: [place] CV [facility];  Service: Cardiovascular;  Laterality: N/A;  ? LEFT HEART CATHETERIZATION WITH CORONARY ANGIOGRAM N/A 07/15/2013  ? Procedure: LEFT HEART CATHETERIZATION WITH CORONARY ANGIOGRAM;  Surgeon: Peter M Martinique, MD;  Location: [facility] CATH LAB;  Service: Cardiovascular;  Laterality: N/A;  ? TUBAL LIGATION  1976  ? ? ?FAMILY HISTORY: ?Family History  ?Problem Relation Age of Onset  ? Hypertension Mother   ? Cancer Mother   ?     Lung  ? Diabetes Mother   ? Cancer Father   ?     Lung  ? Diabetes Maternal Grandmother   ? Alzheimer's disease Maternal Grandmother   ? Sleep apnea Son   ? Sleep apnea Daughter   ? ? ?SOCIAL HISTORY: ?Social History  ? ?Socioeconomic History  ? Marital status: Married  ?  Spouse name: Not on file  ? Number of children: Not on file  ? Years of education: Not on file  ? Highest education level: Not on file  ?Occupational History  ? Not on file   ?Tobacco Use  ? Smoking status: Former  ?  Years: 25.00  ?  Types: Cigarettes  ?  Quit date: 07/02/1989  ?  Years since quitting: 32.2  ? Smokeless tobacco: Never  ? Tobacco comments:  ?  "smoked 2-3 cigarettes/w

## 2021-10-02 NOTE — Progress Notes (Signed)
Denyse Amass, RN; Vanessa Ralphs ?got it!   ? ?  ?Previous Messages ?  ?----- Message -----  ?From: Brandon Melnick, RN  ?Sent: 09/28/2021   5:00 PM EDT  ?To: Ocie Bob  ?Subject: restart CPAP therapy                          ? ?New order in Epic for pt  ?Renae Fickle. [place]  ?Female, 71 y.o., [birth year]  ?MRN:  ?[id number]  ?Thank you.     ? ?

## 2021-10-13 DIAGNOSIS — I10 Essential (primary) hypertension: Secondary | ICD-10-CM | POA: Diagnosis not present

## 2021-10-13 DIAGNOSIS — E1122 Type 2 diabetes mellitus with diabetic chronic kidney disease: Secondary | ICD-10-CM | POA: Diagnosis not present

## 2021-10-13 DIAGNOSIS — I5032 Chronic diastolic (congestive) heart failure: Secondary | ICD-10-CM | POA: Diagnosis not present

## 2021-10-13 DIAGNOSIS — E7800 Pure hypercholesterolemia, unspecified: Secondary | ICD-10-CM | POA: Diagnosis not present

## 2021-10-19 ENCOUNTER — Ambulatory Visit: Payer: Medicare Other | Admitting: Physician Assistant

## 2021-10-19 ENCOUNTER — Encounter: Payer: Self-pay | Admitting: Hematology

## 2021-10-19 ENCOUNTER — Encounter: Payer: Self-pay | Admitting: Physician Assistant

## 2021-10-19 VITALS — BP 112/68 | HR 72 | Ht 59.5 in | Wt 190.0 lb

## 2021-10-19 DIAGNOSIS — I2510 Atherosclerotic heart disease of native coronary artery without angina pectoris: Secondary | ICD-10-CM | POA: Diagnosis not present

## 2021-10-19 DIAGNOSIS — I5032 Chronic diastolic (congestive) heart failure: Secondary | ICD-10-CM | POA: Diagnosis not present

## 2021-10-19 DIAGNOSIS — E782 Mixed hyperlipidemia: Secondary | ICD-10-CM

## 2021-10-19 DIAGNOSIS — I10 Essential (primary) hypertension: Secondary | ICD-10-CM

## 2021-10-19 MED ORDER — EZETIMIBE 10 MG PO TABS: 10.0000 mg | ORAL_TABLET | Freq: Every day | ORAL | 3 refills | Status: DC

## 2021-10-19 NOTE — Progress Notes (Signed)
?Cardiology Office Note:   ? ?Date:  10/19/2021  ? ?ID:  [name], DOB [birth year], MRN [id number] ? ?PCP:  Kelli Ada, MD  ?[facility] Cardiologist:  Kelli Him, MD  ?[facility] Electrophysiologist:  None  ? ?Chief Complaint: 2 yr follow up  ? ?History of Present Illness:   ? ?[name] is a 71 y.o. female with a hx of CAD,  chronic diastolic CHF, probable CKD II, DM, OSA, HTN and HLD seen for long due follow up.  ? ?History of CABG x2 (LIMA-LAD and SVG-Diag) in 2008 with subsequent stenting. Most recent cath in 12/2016 showed patent LIMA-LAD graft with known occlusion of SVG-Diag. LVEF of 60-65% on most recent Echo from 12/2018 (improved from 40-45% in 2016). ? ?Here today for follow-up.  No regular exercise but takes care of 3 grandkids without any problem.  Denies shortness of breath, orthopnea, PND, syncope, lower extremity edema or melena.  Compliant with medications. ? ?Past Medical History:  ?Diagnosis Date  ? Anemia   ? "I see dr. Burr Medico @ [place]; I've had transfusion for low iron (04/17/2016)  ? Anxiety   ? Arthritis   ? "left hip, fingers"  (04/17/2016)  ? At risk for medication noncompliance   ? Atypical chest pain   ? a. atypical symptoms since prior cath.  ? CAD (coronary artery disease)   ? a. s/p 2007  LAD stent. b. s/p 07/2006 CABG with LIMA-LAD & SVG to Diag. c. 12/2006 diagonal branch w/ 2.5 x 12 mm Endeavor stent. d. s/p 2010 cath with small vessel disease treated with RX, stable by repeat 2015. e. CP/abnl nuc -> LHC 04/17/16: new ostial stenosis of LIMA-LAD s/p DES, EF 45%, chronically occ SVG-D2. f. Stable cath 12/2016.  ? Chronic combined systolic and diastolic CHF (congestive heart failure) ([place])   ? CKD (chronic kidney disease), stage II   ? Depression   ? GERD (gastroesophageal reflux disease)   ? Headache(784.0)   ? "maybe once/month" (04/17/2016)  ? Hyperlipidemia   ? Hypertension   ? Migraines   ? "couple times/yr"  ((04/17/2016)  ? Myocardial infarction [facility])  ?2008  ? Obesity   ? OSA on CPAP   ? "suppose to wear mask; can't find it" (10/17//2017)  ? Osteopenia 12/2010 &01/21/2013  ? Minimal of hip DEXA   ? Pneumonia   ? "several years" (04/17/2016)  ? Retinopathy 12/08  ? right  ? RLS (restless legs syndrome)   ? Thyroid disease   ? "blood work didn't show it but my dr said he can tell by looking at me; I had radiation" (04/17/2016)  ? Type II diabetes mellitus ([place])   ? ? ?Past Surgical History:  ?Procedure Laterality Date  ? CARDIAC CATHETERIZATION  02/03/2008  ? for CP patent LIMA w mid-vessel spasm.  ? CARDIAC CATHETERIZATION N/A 04/17/2016  ? Procedure: Left Heart Cath and Cors/Grafts Angiography;  Surgeon: Kelli M Martinique, MD;  Location: [place] CV LAB;  Service: Cardiovascular;  Laterality: N/A;  ? CARDIAC CATHETERIZATION N/A 04/17/2016  ? Procedure: Coronary Stent Intervention;  Surgeon: Kelli M Martinique, MD;  Location: [place] [facility];  Service: Cardiovascular;  Laterality: N/A;  ? COLONOSCOPY  10/2010  ? rectal polyp  ? CORONARY ANGIOPLASTY WITH STENT PLACEMENT    ? "I've had quite a few stents"   ? CORONARY ANGIOPLASTY WITH STENT PLACEMENT  04/17/2016  ? CORONARY ARTERY BYPASS GRAFT  ?2008  ? s/p two vessel CABG off pump LAD  diagonal and PTCA/DE Stent mid LAD/ DE stent LAD diagonal through previous stent sstruts,occluded SVG-diagonal  ? LAPAROSCOPIC CHOLECYSTECTOMY  1990's  ? LEFT HEART CATH AND CORS/GRAFTS ANGIOGRAPHY N/A 12/31/2016  ? Procedure: Left Heart Cath and Cors/Grafts Angiography;  Surgeon: [name], Kelli M, MD;  Location: [place] CV LAB;  Service: Cardiovascular;  Laterality: N/A;  ? LEFT HEART CATHETERIZATION WITH CORONARY ANGIOGRAM N/A 07/15/2013  ? Procedure: LEFT HEART CATHETERIZATION WITH CORONARY ANGIOGRAM;  Surgeon: Kelli M Martinique, MD;  Location: [facility] CATH LAB;  Service: Cardiovascular;  Laterality: N/A;  ? TUBAL LIGATION  1976  ? ? ?Current Medications: ?Current Meds  ?Medication Sig  ? acetaminophen (TYLENOL) 500 MG tablet Take 2 tablets  (1,000 mg total) by mouth every 6 (six) hours as needed for moderate pain or headache.  ? aspirin EC 81 MG tablet Take 1 tablet (81 mg total) by mouth daily.  ? atorvastatin (LIPITOR) 80 MG tablet Take 80 mg by mouth daily.  ? Empagliflozin-linaGLIPtin (GLYXAMBI) 25-5 MG TABS Take by mouth daily.  ? ezetimibe (ZETIA) 10 MG tablet Take 1 tablet (10 mg total) by mouth daily. Please make overdue appt with Dr Radford Pax before anymore refills. Thank you 2nd attempt  ? fenofibrate (TRICOR) 48 MG tablet TAKE 1 TABLET BY MOUTH  DAILY  ? furosemide (LASIX) 20 MG tablet   ? glimepiride (AMARYL) 4 MG tablet Take 4 mg by mouth 2 (two) times daily.  ? lisinopril (ZESTRIL) 5 MG tablet Take 5 mg by mouth daily.  ? Magnesium 250 MG TABS Take by mouth.  ? VITAMIN D PO Take 50 mcg by mouth.  ? [DISCONTINUED] metFORMIN (GLUCOPHAGE) 1000 MG tablet TAKE 1 TABLET BY MOUTH 2 TIMES DAILY WITH A MEAL. (Patient taking differently: Take 500 mg by mouth 2 (two) times daily with a meal.)  ?  ? ?Allergies:   Patient has no known allergies.  ? ?Social History  ? ?Socioeconomic History  ? Marital status: Married  ?  Spouse name: Not on file  ? Number of children: Not on file  ? Years of education: Not on file  ? Highest education level: Not on file  ?Occupational History  ? Not on file  ?Tobacco Use  ? Smoking status: Former  ?  Years: 25.00  ?  Types: Cigarettes  ?  Quit date: 07/02/1989  ?  Years since quitting: 32.3  ? Smokeless tobacco: Never  ? Tobacco comments:  ?  "smoked 2-3 cigarettes/week"  ?Substance and Sexual Activity  ? Alcohol use: No  ? Drug use: No  ? Sexual activity: Yes  ?Other Topics Concern  ? Not on file  ?Social History Narrative  ? Not on file  ? ?Social Determinants of Health  ? ?Financial Resource Strain: Not on file  ?Food Insecurity: Not on file  ?Transportation Needs: Not on file  ?Physical Activity: Not on file  ?Stress: Not on file  ?Social Connections: Not on file  ?  ? ?Family History: ?The patient's family history  includes Alzheimer's disease in her maternal grandmother; Cancer in her father and mother; Diabetes in her maternal grandmother and mother; Hypertension in her mother; Sleep apnea in her daughter and son.   ? ?ROS:   ?Please see the history of present illness.    ?All other systems reviewed and are negative.  ? ?EKGs/Labs/Other Studies Reviewed:   ? ?The following studies were reviewed today: ?As summarized above  ? ?EKG:  EKG is  ordered today.  The ekg ordered today demonstrates  NSR ? ?Recent Labs: ?No results found for requested labs within last 8760 hours.  ?Recent Lipid Panel ?   ?Component Value Date/Time  ? CHOL 78 02/18/2020 1037  ? CHOL 156 11/02/2019 1523  ? TRIG 102 02/18/2020 1037  ? HDL 29 (L) 02/18/2020 1037  ? HDL 36 (L) 11/02/2019 1523  ? CHOLHDL 2.7 02/18/2020 1037  ? VLDL 20 02/18/2020 1037  ? [place] 29 02/18/2020 1037  ? [place] 78 11/02/2019 1523  ? ? ?Physical Exam:   ? ?VS:  BP 112/68   Pulse 72   Ht 4' 11.5" (1.511 m)   Wt 190 lb (86.2 kg)   SpO2 98%   BMI 37.73 kg/m?    ? ?Wt Readings from Last 3 Encounters:  ?10/19/21 190 lb (86.2 kg)  ?09/28/21 196 lb 1.6 oz (89 kg)  ?05/30/21 186 lb 1.6 oz (84.4 kg)  ?  ? ?GEN:  Well nourished, well developed in no acute distress ?HEENT: Normal ?NECK: No JVD; No carotid bruits ?LYMPHATICS: No lymphadenopathy ?CARDIAC: RRR, no murmurs, rubs, gallops ?RESPIRATORY:  Clear to auscultation without rales, wheezing or rhonchi  ?ABDOMEN: Soft, non-tender, non-distended ?MUSCULOSKELETAL:  No edema; No deformity  ?SKIN: Warm and dry ?NEUROLOGIC:  Alert and oriented x 3 ?PSYCHIATRIC:  Normal affect  ? ?ASSESSMENT AND PLAN:  ? ? ?CAD ?- No angina. Continue ASA, statin , BB and Imdur.  ? ?2. HTN ?- BP stable on current medications ? ?3. HLD ?- No results found for requested labs within last 8760 hours.  ?- Followed by PCP ?- Continue statin  ? ?4. Chronic diastolic CHF ?- Continue lasix. Euvolemic.  ? ?Medication Adjustments/Labs and Tests Ordered: ?Current  medicines are reviewed at length with the patient today.  Concerns regarding medicines are outlined above.  ?No orders of the defined types were placed in this encounter. ? ?No orders of the defined types were placed in

## 2021-10-19 NOTE — Patient Instructions (Signed)
Medication Instructions:  Your physician recommends that you continue on your current medications as directed. Please refer to the Current Medication list given to you today. *If you need a refill on your cardiac medications before your next appointment, please call your pharmacy*   Lab Work: None Ordered   Testing/Procedures: None Ordered   Follow-Up: At [facility], you and your health needs are our priority.  As part of our continuing mission to provide you with exceptional heart care, we have created designated Provider Care Teams.  These Care Teams include your primary Cardiologist (physician) and Advanced Practice Providers (APPs -  Physician Assistants and Nurse Practitioners) who all work together to provide you with the care you need, when you need it.  We recommend signing up for the patient portal called "MyChart".  Sign up information is provided on this After Visit Summary.  MyChart is used to connect with patients for Virtual Visits (Telemedicine).  Patients are able to view lab/test results, encounter notes, upcoming appointments, etc.  Non-urgent messages can be sent to your provider as well.   To learn more about what you can do with MyChart, go to [email or web address].    Your next appointment:   1 year(s)  The format for your next appointment:   In Person  Provider:   Traci Turner, MD     Other Instructions   Important Information About Sugar       

## 2021-12-26 DIAGNOSIS — D122 Benign neoplasm of ascending colon: Secondary | ICD-10-CM | POA: Diagnosis not present

## 2021-12-26 DIAGNOSIS — Z09 Encounter for follow-up examination after completed treatment for conditions other than malignant neoplasm: Secondary | ICD-10-CM | POA: Diagnosis not present

## 2021-12-26 DIAGNOSIS — D120 Benign neoplasm of cecum: Secondary | ICD-10-CM | POA: Diagnosis not present

## 2021-12-26 DIAGNOSIS — K648 Other hemorrhoids: Secondary | ICD-10-CM | POA: Diagnosis not present

## 2021-12-26 DIAGNOSIS — Z86010 Personal history of colonic polyps: Secondary | ICD-10-CM | POA: Diagnosis not present

## 2021-12-28 DIAGNOSIS — H02831 Dermatochalasis of right upper eyelid: Secondary | ICD-10-CM | POA: Diagnosis not present

## 2021-12-28 DIAGNOSIS — E119 Type 2 diabetes mellitus without complications: Secondary | ICD-10-CM | POA: Diagnosis not present

## 2021-12-28 DIAGNOSIS — H25813 Combined forms of age-related cataract, bilateral: Secondary | ICD-10-CM | POA: Diagnosis not present

## 2021-12-28 DIAGNOSIS — H02834 Dermatochalasis of left upper eyelid: Secondary | ICD-10-CM | POA: Diagnosis not present

## 2021-12-28 DIAGNOSIS — D120 Benign neoplasm of cecum: Secondary | ICD-10-CM | POA: Diagnosis not present

## 2021-12-28 DIAGNOSIS — D122 Benign neoplasm of ascending colon: Secondary | ICD-10-CM | POA: Diagnosis not present

## 2021-12-28 DIAGNOSIS — H40013 Open angle with borderline findings, low risk, bilateral: Secondary | ICD-10-CM | POA: Diagnosis not present

## 2022-01-15 DIAGNOSIS — I10 Essential (primary) hypertension: Secondary | ICD-10-CM | POA: Diagnosis not present

## 2022-01-15 DIAGNOSIS — E1122 Type 2 diabetes mellitus with diabetic chronic kidney disease: Secondary | ICD-10-CM | POA: Diagnosis not present

## 2022-01-15 DIAGNOSIS — E7800 Pure hypercholesterolemia, unspecified: Secondary | ICD-10-CM | POA: Diagnosis not present

## 2022-01-15 DIAGNOSIS — N1832 Chronic kidney disease, stage 3b: Secondary | ICD-10-CM | POA: Diagnosis not present

## 2022-01-15 DIAGNOSIS — I2510 Atherosclerotic heart disease of native coronary artery without angina pectoris: Secondary | ICD-10-CM | POA: Diagnosis not present

## 2022-01-15 DIAGNOSIS — I5022 Chronic systolic (congestive) heart failure: Secondary | ICD-10-CM | POA: Diagnosis not present

## 2022-01-25 DIAGNOSIS — E875 Hyperkalemia: Secondary | ICD-10-CM | POA: Diagnosis not present

## 2022-03-06 DIAGNOSIS — E1122 Type 2 diabetes mellitus with diabetic chronic kidney disease: Secondary | ICD-10-CM | POA: Diagnosis not present

## 2022-03-06 DIAGNOSIS — I5032 Chronic diastolic (congestive) heart failure: Secondary | ICD-10-CM | POA: Diagnosis not present

## 2022-03-06 DIAGNOSIS — E7800 Pure hypercholesterolemia, unspecified: Secondary | ICD-10-CM | POA: Diagnosis not present

## 2022-03-06 DIAGNOSIS — I10 Essential (primary) hypertension: Secondary | ICD-10-CM | POA: Diagnosis not present

## 2022-03-13 DIAGNOSIS — D509 Iron deficiency anemia, unspecified: Secondary | ICD-10-CM | POA: Diagnosis not present

## 2022-03-22 DIAGNOSIS — Z23 Encounter for immunization: Secondary | ICD-10-CM | POA: Diagnosis not present

## 2022-06-07 DIAGNOSIS — I10 Essential (primary) hypertension: Secondary | ICD-10-CM | POA: Diagnosis not present

## 2022-06-07 DIAGNOSIS — I5032 Chronic diastolic (congestive) heart failure: Secondary | ICD-10-CM | POA: Diagnosis not present

## 2022-06-07 DIAGNOSIS — E7800 Pure hypercholesterolemia, unspecified: Secondary | ICD-10-CM | POA: Diagnosis not present

## 2022-06-07 DIAGNOSIS — N1832 Chronic kidney disease, stage 3b: Secondary | ICD-10-CM | POA: Diagnosis not present

## 2022-06-07 DIAGNOSIS — E1122 Type 2 diabetes mellitus with diabetic chronic kidney disease: Secondary | ICD-10-CM | POA: Diagnosis not present

## 2022-06-18 ENCOUNTER — Other Ambulatory Visit: Payer: Self-pay

## 2022-06-18 MED ORDER — EZETIMIBE 10 MG PO TABS: 10.0000 mg | ORAL_TABLET | Freq: Every day | ORAL | 1 refills | Status: DC

## 2022-08-15 DIAGNOSIS — Z0000 Encounter for general adult medical examination without abnormal findings: Secondary | ICD-10-CM | POA: Diagnosis not present

## 2022-08-15 DIAGNOSIS — I2510 Atherosclerotic heart disease of native coronary artery without angina pectoris: Secondary | ICD-10-CM | POA: Diagnosis not present

## 2022-08-15 DIAGNOSIS — Z23 Encounter for immunization: Secondary | ICD-10-CM | POA: Diagnosis not present

## 2022-08-15 DIAGNOSIS — E1122 Type 2 diabetes mellitus with diabetic chronic kidney disease: Secondary | ICD-10-CM | POA: Diagnosis not present

## 2022-08-15 DIAGNOSIS — N1832 Chronic kidney disease, stage 3b: Secondary | ICD-10-CM | POA: Diagnosis not present

## 2022-08-15 DIAGNOSIS — L989 Disorder of the skin and subcutaneous tissue, unspecified: Secondary | ICD-10-CM | POA: Diagnosis not present

## 2022-08-15 DIAGNOSIS — Z1239 Encounter for other screening for malignant neoplasm of breast: Secondary | ICD-10-CM | POA: Diagnosis not present

## 2022-08-15 DIAGNOSIS — I10 Essential (primary) hypertension: Secondary | ICD-10-CM | POA: Diagnosis not present

## 2022-08-15 DIAGNOSIS — E7800 Pure hypercholesterolemia, unspecified: Secondary | ICD-10-CM | POA: Diagnosis not present

## 2022-08-16 ENCOUNTER — Other Ambulatory Visit: Payer: Self-pay | Admitting: Family Medicine

## 2022-08-16 DIAGNOSIS — E2839 Other primary ovarian failure: Secondary | ICD-10-CM

## 2022-08-16 DIAGNOSIS — Z1231 Encounter for screening mammogram for malignant neoplasm of breast: Secondary | ICD-10-CM

## 2022-08-30 DIAGNOSIS — E1122 Type 2 diabetes mellitus with diabetic chronic kidney disease: Secondary | ICD-10-CM | POA: Diagnosis not present

## 2022-08-30 DIAGNOSIS — E7800 Pure hypercholesterolemia, unspecified: Secondary | ICD-10-CM | POA: Diagnosis not present

## 2022-08-30 DIAGNOSIS — I5032 Chronic diastolic (congestive) heart failure: Secondary | ICD-10-CM | POA: Diagnosis not present

## 2022-08-30 DIAGNOSIS — I10 Essential (primary) hypertension: Secondary | ICD-10-CM | POA: Diagnosis not present

## 2022-09-04 DIAGNOSIS — I10 Essential (primary) hypertension: Secondary | ICD-10-CM | POA: Diagnosis not present

## 2022-09-04 DIAGNOSIS — E7800 Pure hypercholesterolemia, unspecified: Secondary | ICD-10-CM | POA: Diagnosis not present

## 2022-09-04 DIAGNOSIS — E1122 Type 2 diabetes mellitus with diabetic chronic kidney disease: Secondary | ICD-10-CM | POA: Diagnosis not present

## 2022-09-04 DIAGNOSIS — N1832 Chronic kidney disease, stage 3b: Secondary | ICD-10-CM | POA: Diagnosis not present

## 2022-09-04 DIAGNOSIS — I2510 Atherosclerotic heart disease of native coronary artery without angina pectoris: Secondary | ICD-10-CM | POA: Diagnosis not present

## 2022-09-27 ENCOUNTER — Other Ambulatory Visit: Payer: Medicare Other

## 2022-09-27 ENCOUNTER — Ambulatory Visit
Admission: RE | Admit: 2022-09-27 | Discharge: 2022-09-27 | Disposition: A | Discharge status: Home / Self Care | Payer: Medicare Other | Source: Ambulatory Visit | Attending: Family Medicine | Admitting: Family Medicine

## 2022-09-27 DIAGNOSIS — Z1231 Encounter for screening mammogram for malignant neoplasm of breast: Secondary | ICD-10-CM | POA: Diagnosis not present

## 2022-10-08 ENCOUNTER — Telehealth: Payer: Self-pay | Admitting: Cardiology

## 2022-10-08 NOTE — Telephone Encounter (Signed)
Pt c/o of Chest Pain: STAT if CP now or developed within 24 hours  1. Are you having CP right now? No   2. Are you experiencing any other symptoms (ex. SOB, nausea, vomiting, sweating)? Sob, nausea, sweating   3. How long have you been experiencing CP? For couple days  4. Is your CP continuous or coming and going? Coming and going  5. Have you taken Nitroglycerin? yes ?  Patient is schedule for 4/12

## 2022-10-08 NOTE — Telephone Encounter (Signed)
Returned call to patient.  Patient reports last episode of chest pain was yesterday (10/07/22) around 2:00pm. She took nitro x1 dose with relief. She states CP lasted for about 5 minutes. She describes it as sharp pain under left breast and on right side of chest, states "it just took my breath away."  She reports a separate episode of feeling nauseated and sweating (not during episode of CP).  She denies any chest pain today thus far.  Patient has appointment with Jari Favre, PA-C on 10/12/22.  Advised patient to go to ED if chest pain reoccurs before her appointment on 10/12/22. Patient verbalized understanding.  Will forward to Dr. Mayford Knife to review.

## 2022-10-11 NOTE — Progress Notes (Signed)
Office Visit    Patient Name: [name] Date of Encounter: 10/12/2022  PCP:  Merri BrunetteSmith, Candace, MD   [facility]  Cardiologist:  Armanda Magicraci Turner, MD  Advanced Practice Provider:  No care team member to display Electrophysiologist:  None 6}  HPI    [name] is a 72 y.o. female with a past medical history of CAD, chronic diastolic CHF, probable CKD 2, DM, OSA, HTN and HLD presents today for follow-up appointment.  She has a history of CABG x 2 (LIMA to LAD and SVG to diagonal) in 2008 with subsequent stenting.  Most recent cath 12/2016 showed patent LIMA to LAD graft with known occlusion of SVG to diagonal.  LV EF 60 to 65% on most recent echocardiogram from 12/2018 (improved from 40 to 45% in 2016).  She presented for follow-up last year and did not have any regular exercise but does take care of her grandkids without any issues.  Denies shortness of breath, orthopnea, PND, syncope, lower extremity edema or melena.  Compliant with medications.  Today, she tells me that over the last few weeks she is having some sharp chest pains but then turned to pressure in the center of her chest. It is not exertional but she does have some associated diaphoresis and SOB as well that's associated. She usually has to hold on the the grocery cart when she is shopping due to the SOB. She has needed to take nitro a few times over the past week. We have renewed her prescription today for this. We discussed several options including echocardiogram, stress PET, and Lexiscan Myoview.  Reports no shortness of breath nor dyspnea on exertion. Reports no chest pain, pressure, or tightness. No edema, orthopnea, PND. Reports no palpitations.   Past Medical History    Past Medical History:  Diagnosis Date   Anemia    "[facility] I've had transfusion for low iron (04/17/2016)   Anxiety    Arthritis    "left hip, fingers"  (04/17/2016)   At risk for medication  noncompliance    Atypical chest pain    a. atypical symptoms since prior cath.   CAD (coronary artery disease)    a. s/p 2007  LAD stent. b. s/p 07/2006 CABG with LIMA-LAD & SVG to Diag. c. 12/2006 diagonal branch w/ 2.5 x 12 mm Endeavor stent. d. s/p 2010 cath with small vessel disease treated with RX, stable by repeat 2015. e. CP/abnl nuc -> LHC 04/17/16: new ostial stenosis of LIMA-LAD s/p DES, EF 45%, chronically occ SVG-D2. f. Stable cath 12/2016.   Chronic combined systolic and diastolic CHF (congestive heart failure)    CKD (chronic kidney disease), stage II    Depression    GERD (gastroesophageal reflux disease)    Headache(784.0)    "maybe once/month" (04/17/2016)   Hyperlipidemia    Hypertension    Migraines    "couple times/yr"  ((04/17/2016)   Myocardial infarction ?2008   Obesity    OSA on CPAP    "suppose to wear mask; can't find it" (10/17//2017)   Osteopenia 12/2010 &01/21/2013   Minimal of hip DEXA    Pneumonia    "several years" (04/17/2016)   Retinopathy 12/08   right   RLS (restless legs syndrome)    Thyroid disease    "blood work didn't show it but my dr said he can tell by looking at me; I had radiation" (04/17/2016)   Type II diabetes  mellitus    Past Surgical History:  Procedure Laterality Date   CARDIAC CATHETERIZATION  02/03/2008   for CP patent LIMA w mid-vessel spasm.   CARDIAC CATHETERIZATION N/A 04/17/2016   Procedure: Left Heart Cath and Cors/Grafts Angiography;  Surgeon: Peter M Swaziland, MD;  Location: [facility] INVASIVE CV LAB;  Service: Cardiovascular;  Laterality: N/A;   CARDIAC CATHETERIZATION N/A 04/17/2016   Procedure: Coronary Stent Intervention;  Surgeon: Peter M Swaziland, MD;  Location: [facility] INVASIVE CV LAB;  Service: Cardiovascular;  Laterality: N/A;   COLONOSCOPY  10/2010   rectal polyp   CORONARY ANGIOPLASTY WITH STENT PLACEMENT     "I've had quite a few stents"    CORONARY ANGIOPLASTY WITH STENT PLACEMENT  04/17/2016   CORONARY ARTERY BYPASS GRAFT   ?2008   s/p two vessel CABG off pump LAD diagonal and PTCA/DE Stent mid LAD/ DE stent LAD diagonal through previous stent sstruts,occluded SVG-diagonal   LAPAROSCOPIC CHOLECYSTECTOMY  1990's   LEFT HEART CATH AND CORS/GRAFTS ANGIOGRAPHY N/A 12/31/2016   Procedure: Left Heart Cath and Cors/Grafts Angiography;  Surgeon: Swaziland, Peter M, MD;  Location: [facility] INVASIVE CV LAB;  Service: Cardiovascular;  Laterality: N/A;   LEFT HEART CATHETERIZATION WITH CORONARY ANGIOGRAM N/A 07/15/2013   Procedure: LEFT HEART CATHETERIZATION WITH CORONARY ANGIOGRAM;  Surgeon: Peter M Swaziland, MD;  Location: [facility] CATH LAB;  Service: Cardiovascular;  Laterality: N/A;   TUBAL LIGATION  1976    Allergies  No Known Allergies EKGs/Labs/Other Studies Reviewed:   The following studies were reviewed today:  Echo 02/18/20 IMPRESSIONS     1. Left ventricular ejection fraction, by estimation, is 55 to 60%. The  left ventricle has normal function. The left ventricle has no regional  wall motion abnormalities. Left ventricular diastolic parameters were  normal.   2. Right ventricular systolic function is low normal. The right  ventricular size is mildly enlarged.   3. The mitral valve is normal in structure. No evidence of mitral valve  regurgitation.   4. The aortic valve is grossly normal. Aortic valve regurgitation is not  visualized.   Comparison(s): The left ventricular function is unchanged.   FINDINGS   Left Ventricle: Left ventricular ejection fraction, by estimation, is 55  to 60%. The left ventricle has normal function. The left ventricle has no  regional wall motion abnormalities. The left ventricular internal cavity  size was normal in size. There is   no left ventricular hypertrophy. Left ventricular diastolic parameters  were normal.   Right Ventricle: The right ventricular size is mildly enlarged. Right  vetricular wall thickness was not assessed. Right ventricular systolic  function is low normal.    Left Atrium: Left atrial size was normal in size.   Right Atrium: Right atrial size was normal in size.   Pericardium: There is no evidence of pericardial effusion.   Mitral Valve: The mitral valve is normal in structure. No evidence of  mitral valve regurgitation.   Tricuspid Valve: The tricuspid valve is normal in structure. Tricuspid  valve regurgitation is trivial.   Aortic Valve: The aortic valve is grossly normal. Aortic valve  regurgitation is not visualized.   Pulmonic Valve: The pulmonic valve was grossly normal. Pulmonic valve  regurgitation is not visualized.   Aorta: The aortic root and ascending aorta are structurally normal, with  no evidence of dilitation.   IAS/Shunts: The interatrial septum was not assessed.    EKG:  EKG is  ordered today.  The ekg ordered today demonstrates NSR rate 72  bpm  Recent Labs: No results found for requested labs within last 365 days.  Recent Lipid Panel    Component Value Date/Time   CHOL 78 02/18/2020 1037   CHOL 156 11/02/2019 1523   TRIG 102 02/18/2020 1037   HDL 29 (L) 02/18/2020 1037   HDL 36 (L) 11/02/2019 1523   CHOLHDL 2.7 02/18/2020 1037   VLDL 20 02/18/2020 1037   LDLCALC 29 02/18/2020 1037   LDLCALC 78 11/02/2019 1523    Home Medications   Current Meds  Medication Sig   acetaminophen (TYLENOL) 500 MG tablet Take 2 tablets (1,000 mg total) by mouth every 6 (six) hours as needed for moderate pain or headache.   aspirin EC 81 MG tablet Take 1 tablet (81 mg total) by mouth daily.   atorvastatin (LIPITOR) 80 MG tablet Take 80 mg by mouth daily.   cyanocobalamin (VITAMIN B12) 1000 MCG tablet Take 1,000 mcg by mouth daily.   Empagliflozin-linaGLIPtin (GLYXAMBI) 25-5 MG TABS Take by mouth daily.   ezetimibe (ZETIA) 10 MG tablet Take 1 tablet (10 mg total) by mouth daily.   furosemide (LASIX) 20 MG tablet    glimepiride (AMARYL) 4 MG tablet Take 4 mg by mouth 2 (two) times daily.   isosorbide mononitrate (IMDUR)  60 MG 24 hr tablet Take 1 tablet (60 mg total) by mouth daily.   lisinopril (ZESTRIL) 5 MG tablet Take 5 mg by mouth daily.   metFORMIN (GLUCOPHAGE) 500 MG tablet Take 1 tablet by mouth 2 (two) times daily with a meal.   metoprolol tartrate (LOPRESSOR) 25 MG tablet Take 1 tablet (25 mg total) by mouth 2 (two) times daily.   nitroGLYCERIN (NITROSTAT) 0.4 MG SL tablet Place 1 tablet (0.4 mg total) under the tongue every 5 (five) minutes as needed for chest pain.   VITAMIN D PO Take 50 mcg by mouth.     Review of Systems      All other systems reviewed and are otherwise negative except as noted above.  Physical Exam    VS:  BP 114/60   Pulse 72   Ht 4' 11.75" (1.518 m)   Wt 183 lb (83 kg)   BMI 36.04 kg/m  , BMI Body mass index is 36.04 kg/m.  Wt Readings from Last 3 Encounters:  10/12/22 183 lb (83 kg)  10/19/21 190 lb (86.2 kg)  09/28/21 196 lb 1.6 oz (89 kg)     GEN: Well nourished, well developed, in no acute distress. HEENT: normal. Neck: Supple, no JVD, carotid bruits, or masses. Cardiac: RRR, no murmurs, rubs, or gallops. No clubbing, cyanosis, edema.  Radials/PT 2+ and equal bilaterally.  Respiratory:  Respirations regular and unlabored, clear to auscultation bilaterally. GI: Soft, nontender, nondistended. MS: No deformity or atrophy. Skin: Warm and dry, no rash. Neuro:  Strength and sensation are intact. Psych: Normal affect.  Assessment & Plan    CAD -new chest pain/pressure -will plan for stress PET for evaluation -patient has remote hx of CABG and stenting -Continue aspirin 81 mg daily, Lipitor 80 mg daily, Jardiance 10 mg daily, Zetia 10 mg daily, fenofibrate 48 mg daily, Lasix 20 mg daily, Imdur 60 mg daily, lisinopril 5 mg daily, Lopressor 25 mg twice a day  HTN -well controlled today -continue current medications  HLD -Continue Lipitor 80 mg daily, Zetia 10 mg daily, and fenofibrate 48 mg daily -Most recent labs a month ago showed LDL 43,  triglycerides 192 (she has not been on her fenofibrate in the last  couple of months since she ran out of his medication) -repeat lipid panel in 8 weeks  Chronic diastolic CHF -euvolemic on exam -continue current medications         Disposition: Follow up 6 weeks following testing with Armanda Magic, MD or APP.  Signed, Sharlene Dory, PA-C 10/12/2022, 10:59 AM [facility]

## 2022-10-12 ENCOUNTER — Encounter: Payer: Self-pay | Admitting: Physician Assistant

## 2022-10-12 ENCOUNTER — Ambulatory Visit: Payer: Medicare Other | Attending: Physician Assistant | Admitting: Physician Assistant

## 2022-10-12 VITALS — BP 114/60 | HR 72 | Ht 59.75 in | Wt 183.0 lb

## 2022-10-12 DIAGNOSIS — I10 Essential (primary) hypertension: Secondary | ICD-10-CM

## 2022-10-12 DIAGNOSIS — I5032 Chronic diastolic (congestive) heart failure: Secondary | ICD-10-CM | POA: Diagnosis not present

## 2022-10-12 DIAGNOSIS — R079 Chest pain, unspecified: Secondary | ICD-10-CM | POA: Diagnosis not present

## 2022-10-12 DIAGNOSIS — E785 Hyperlipidemia, unspecified: Secondary | ICD-10-CM | POA: Diagnosis not present

## 2022-10-12 DIAGNOSIS — I2510 Atherosclerotic heart disease of native coronary artery without angina pectoris: Secondary | ICD-10-CM

## 2022-10-12 MED ORDER — NITROGLYCERIN 0.4 MG SL SUBL: 0.4000 mg | SUBLINGUAL_TABLET | SUBLINGUAL | 0 refills | Status: DC | PRN

## 2022-10-12 MED ORDER — FENOFIBRATE 48 MG PO TABS: 48.0000 mg | ORAL_TABLET | Freq: Every day | ORAL | 3 refills | Status: DC

## 2022-10-12 NOTE — Patient Instructions (Signed)
Medication Instructions:  Your physician recommends that you continue on your current medications as directed. Please refer to the Current Medication list given to you today.  *If you need a refill on your cardiac medications before your next appointment, please call your pharmacy*   Lab Work: None If you have labs (blood work) drawn today and your tests are completely normal, you will receive your results only by: MyChart Message (if you have MyChart) OR A paper copy in the mail If you have any lab test that is abnormal or we need to change your treatment, we will call you to review the results.   Testing/Procedures: How to Prepare for Your Cardiac PET/CT Stress Test:  1. Please do not take these medications before your test:   Medications that may interfere with the cardiac pharmacological stress agent (ex. nitrates - including erectile dysfunction medications, isosorbide mononitrate, tamulosin or beta-blockers) the day of the exam. (Erectile dysfunction medication should be held for at least 72 hrs prior to test) Theophylline containing medications for 12 hours. Dipyridamole 48 hours prior to the test. Your remaining medications may be taken with water.  2. Nothing to eat or drink, except water, 3 hours prior to arrival time.   NO caffeine/decaffeinated products, or chocolate 12 hours prior to arrival.  3. NO perfume, cologne or lotion  4. Total time is 1 to 2 hours; you may want to bring reading material for the waiting time.  5. Please report to Radiology at the [facility] Main Entrance 30 minutes early for your test.  [address] [place], [place] [address]  Diabetic Preparation:  Hold oral medications. You may take NPH and Lantus insulin. Do not take Humalog or Humulin R (Regular Insulin) the day of your test. Check blood sugars prior to leaving the house. If able to eat breakfast prior to 3 hour fasting, you may take all medications, including your  insulin, Do not worry if you miss your breakfast dose of insulin - start at your next meal.  IF YOU THINK YOU MAY BE PREGNANT, OR ARE NURSING PLEASE INFORM THE TECHNOLOGIST.  In preparation for your appointment, medication and supplies will be purchased.  Appointment availability is limited, so if you need to cancel or reschedule, please call the Radiology Department at [phone]  24 hours in advance to avoid a cancellation fee of $100.00  What to Expect After you Arrive:  Once you arrive and check in for your appointment, you will be taken to a preparation room within the Radiology Department.  A technologist or Nurse will obtain your medical history, verify that you are correctly prepped for the exam, and explain the procedure.  Afterwards,  an IV will be started in your arm and electrodes will be placed on your skin for EKG monitoring during the stress portion of the exam. Then you will be escorted to the PET/CT scanner.  There, staff will get you positioned on the scanner and obtain a blood pressure and EKG.  During the exam, you will continue to be connected to the EKG and blood pressure machines.  A small, safe amount of a radioactive tracer will be injected in your IV to obtain a series of pictures of your heart along with an injection of a stress agent.    After your Exam:  It is recommended that you eat a meal and drink a caffeinated beverage to counter act any effects of the stress agent.  Drink plenty of fluids for the remainder  of the day and urinate frequently for the first couple of hours after the exam.  Your doctor will inform you of your test results within 7-10 business days.  For questions about your test or how to prepare for your test, please call: Rockwell Alexandria, Cardiac Imaging Nurse Navigator  Larey Brick, Cardiac Imaging Nurse Navigator Office: [phone]    Follow-Up: At [facility], you and your health needs are our priority.  As part of our  continuing mission to provide you with exceptional heart care, we have created designated Provider Care Teams.  These Care Teams include your primary Cardiologist (physician) and Advanced Practice Providers (APPs -  Physician Assistants and Nurse Practitioners) who all work together to provide you with the care you need, when you need it.  We recommend signing up for the patient portal called "MyChart".  Sign up information is provided on this After Visit Summary.  MyChart is used to connect with patients for Virtual Visits (Telemedicine).  Patients are able to view lab/test results, encounter notes, upcoming appointments, etc.  Non-urgent messages can be sent to your provider as well.   To learn more about what you can do with MyChart, go to [email or web address].    Your next appointment:   6 week(s)  Provider:   Armanda Magic, MD  or APP

## 2022-11-19 ENCOUNTER — Telehealth: Payer: Self-pay | Admitting: Physician Assistant

## 2022-11-19 NOTE — Telephone Encounter (Signed)
New Message:     Daughter called and says the patient have an appointment on 11-22-22. She has a Pet Scan scheduled [phone]. Her question, does she need to have her appointment before her test or after her test?

## 2022-11-19 NOTE — Telephone Encounter (Signed)
Returned call to patient's daughter Yixin Culliton who is asking if patient needs to reschedule appt with Tessa on 5/23 for after cardiac PET scan on 5/28.  Reviewed last OV note and provider did want to follow-up after scan. Offered next available appt with Jari Favre, PA-C on 12/04/22 at 1:30pm. Elease Hashimoto accepted and states she will let patient know. She expressed appreciation for call.

## 2022-11-22 ENCOUNTER — Ambulatory Visit: Payer: Medicare Other | Admitting: Physician Assistant

## 2022-11-22 ENCOUNTER — Telehealth (HOSPITAL_COMMUNITY): Payer: Self-pay | Admitting: *Deleted

## 2022-11-22 NOTE — Telephone Encounter (Signed)
Reaching out to patient to offer assistance regarding upcoming cardiac imaging study; pt verbalizes understanding of appt date/time, parking situation and where to check in, pre-test NPO status and verified current allergies; name and call back number provided for further questions should they arise  Jumar Greenstreet RN Navigator Cardiac Imaging [facility] [phone] office [phone] cell  Patient aware to avoid caffeine 12 hours prior to cardiac PET scan. 

## 2022-11-27 ENCOUNTER — Encounter (HOSPITAL_COMMUNITY)
Admission: RE | Admit: 2022-11-27 | Discharge: 2022-11-27 | Disposition: A | Discharge status: Home / Self Care | Payer: Medicare Other | Source: Ambulatory Visit | Attending: Physician Assistant | Admitting: Physician Assistant

## 2022-11-27 DIAGNOSIS — R079 Chest pain, unspecified: Secondary | ICD-10-CM | POA: Diagnosis not present

## 2022-11-27 LAB — NM PET CT CARDIAC PERFUSION MULTI W/ABSOLUTE BLOODFLOW
LV dias vol: 83 mL (ref 46–106)
MBFR: 1.55
Nuc Rest EF: 61 %
Nuc Stress EF: 63 %
Peak HR: 102 {beats}/min
Rest HR: 85 {beats}/min
Rest MBF: 0.95 ml/g/min
Rest Nuclear Isotope Dose: 21.5 mCi
Rest perfusion cavity size (mL): 83 mL
ST Depression (mm): 0 mm
Stress MBF: 1.47 ml/g/min
Stress Nuclear Isotope Dose: 21.5 mCi
Stress perfusion cavity size (mL): 83 mL
TID: 1.05

## 2022-11-27 MED ORDER — RUBIDIUM RB82 GENERATOR (RUBYFILL): 21.5000 | PACK | Freq: Once | INTRAVENOUS | Status: DC

## 2022-11-27 MED ORDER — REGADENOSON 0.4 MG/5ML IV SOLN
INTRAVENOUS | Status: AC
  Filled 2022-11-27: qty 5

## 2022-11-27 MED ORDER — REGADENOSON 0.4 MG/5ML IV SOLN
0.4000 mg | Freq: Once | INTRAVENOUS | Status: AC
  Administered 2022-11-27: 0.4 mg via INTRAVENOUS

## 2022-11-27 MED ORDER — CAFFEINE CITRATE BASE COMPONENT 10 MG/ML IV SOLN
INTRAVENOUS | Status: AC
  Filled 2022-11-27: qty 3

## 2022-11-27 MED ORDER — DEXTROSE 5 % IV SOLN
INTRAVENOUS | Status: AC
  Filled 2022-11-27: qty 50

## 2022-11-27 MED ORDER — RUBIDIUM RB82 GENERATOR (RUBYFILL): 21.0000 | PACK | Freq: Once | INTRAVENOUS | Status: DC

## 2022-11-27 NOTE — Progress Notes (Signed)
Tolerated exam well

## 2022-11-28 ENCOUNTER — Other Ambulatory Visit: Payer: Self-pay | Admitting: Physician Assistant

## 2022-11-28 NOTE — Telephone Encounter (Signed)
Patient of Dr. Turner. Please review for refill. Thank you!  

## 2022-12-03 NOTE — Progress Notes (Unsigned)
Office Visit    Patient Name: [name] Date of Encounter: 12/04/2022  PCP:  Merri Brunette, MD   [facility]  Cardiologist:  Armanda Magic, MD  Advanced Practice Provider:  No care team member to display Electrophysiologist:  None 6}  HPI    [name] is a 72 y.o. female with a past medical history of CAD, chronic diastolic CHF, probable CKD 2, DM, OSA, HTN and HLD presents today for follow-up appointment.  She has a history of CABG x 2 (LIMA to LAD and SVG to diagonal) in 2008 with subsequent stenting.  Most recent cath 12/2016 showed patent LIMA to LAD graft with known occlusion of SVG to diagonal.  LV EF 60 to 65% on most recent echocardiogram from 12/2018 (improved from 40 to 45% in 2016).  She presented for follow-up last year and did not have any regular exercise but does take care of her grandkids without any issues.  Denies shortness of breath, orthopnea, PND, syncope, lower extremity edema or melena.  Compliant with medications.  Seen by me 4/12, she tells me that over the last few weeks she is having some sharp chest pains but then turned to pressure in the center of her chest. It is not exertional but she does have some associated diaphoresis and SOB as well that's associated. She usually has to hold on the the grocery cart when she is shopping due to the SOB. She has needed to take nitro a few times over the past week. We have renewed her prescription today for this. We discussed several options including echocardiogram, stress PET, and Lexiscan Myoview.  Today, she tells me that her blood pressure is usually a little.  She does have a way to take at home.  Typically takes it before medications.  Suggested taking an hour after morning medications to make sure is not too low. Discussed the risks of blood blood pressure dizziness/lightheadedness and increased risk of falls.  Sometimes, she does get short episodes likely multifactorial.  Went over  her cardiac PET/CT without any ischemia.  Reviewed her most recent labs.  LDL is at goal but her triglycerides are high.  Reports no shortness of breath nor dyspnea on exertion. Reports no chest pain, pressure, or tightness. No edema, orthopnea, PND. Reports no palpitations.   Past Medical History    Past Medical History:  Diagnosis Date   Anemia    "[facility] I've had transfusion for low iron (04/17/2016)   Anxiety    Arthritis    "left hip, fingers"  (04/17/2016)   At risk for medication noncompliance    Atypical chest pain    a. atypical symptoms since prior cath.   CAD (coronary artery disease)    a. s/p 2007  LAD stent. b. s/p 07/2006 CABG with LIMA-LAD & SVG to Diag. c. 12/2006 diagonal branch w/ 2.5 x 12 mm Endeavor stent. d. s/p 2010 cath with small vessel disease treated with RX, stable by repeat 2015. e. CP/abnl nuc -> LHC 04/17/16: new ostial stenosis of LIMA-LAD s/p DES, EF 45%, chronically occ SVG-D2. f. Stable cath 12/2016.   Chronic combined systolic and diastolic CHF (congestive heart failure) (HCC)    CKD (chronic kidney disease), stage II    Depression    GERD (gastroesophageal reflux disease)    Headache(784.0)    "maybe once/month" (04/17/2016)   Hyperlipidemia    Hypertension    Migraines    "couple  times/yr"  ((04/17/2016)   Myocardial infarction [facility]) ?2008   Obesity    OSA on CPAP    "suppose to wear mask; can't find it" (10/17//2017)   Osteopenia 12/2010 &01/21/2013   Minimal of hip DEXA    Pneumonia    "several years" (04/17/2016)   Retinopathy 12/08   right   RLS (restless legs syndrome)    Thyroid disease    "blood work didn't show it but my dr said he can tell by looking at me; I had radiation" (04/17/2016)   Type II diabetes mellitus (HCC)    Past Surgical History:  Procedure Laterality Date   CARDIAC CATHETERIZATION  02/03/2008   for CP patent LIMA w mid-vessel spasm.   CARDIAC CATHETERIZATION N/A 04/17/2016   Procedure:  Left Heart Cath and Cors/Grafts Angiography;  Surgeon: Peter M Swaziland, MD;  Location: [facility] INVASIVE CV LAB;  Service: Cardiovascular;  Laterality: N/A;   CARDIAC CATHETERIZATION N/A 04/17/2016   Procedure: Coronary Stent Intervention;  Surgeon: Peter M Swaziland, MD;  Location: [facility] INVASIVE CV LAB;  Service: Cardiovascular;  Laterality: N/A;   COLONOSCOPY  10/2010   rectal polyp   CORONARY ANGIOPLASTY WITH STENT PLACEMENT     "I've had quite a few stents"    CORONARY ANGIOPLASTY WITH STENT PLACEMENT  04/17/2016   CORONARY ARTERY BYPASS GRAFT  ?2008   s/p two vessel CABG off pump LAD diagonal and PTCA/DE Stent mid LAD/ DE stent LAD diagonal through previous stent sstruts,occluded SVG-diagonal   LAPAROSCOPIC CHOLECYSTECTOMY  1990's   LEFT HEART CATH AND CORS/GRAFTS ANGIOGRAPHY N/A 12/31/2016   Procedure: Left Heart Cath and Cors/Grafts Angiography;  Surgeon: Swaziland, Peter M, MD;  Location: [facility] INVASIVE CV LAB;  Service: Cardiovascular;  Laterality: N/A;   LEFT HEART CATHETERIZATION WITH CORONARY ANGIOGRAM N/A 07/15/2013   Procedure: LEFT HEART CATHETERIZATION WITH CORONARY ANGIOGRAM;  Surgeon: Peter M Swaziland, MD;  Location: [facility] CATH LAB;  Service: Cardiovascular;  Laterality: N/A;   TUBAL LIGATION  1976    Allergies  No Known Allergies EKGs/Labs/Other Studies Reviewed:   The following studies were reviewed today:  Echo 02/18/20 IMPRESSIONS     1. Left ventricular ejection fraction, by estimation, is 55 to 60%. The  left ventricle has normal function. The left ventricle has no regional  wall motion abnormalities. Left ventricular diastolic parameters were  normal.   2. Right ventricular systolic function is low normal. The right  ventricular size is mildly enlarged.   3. The mitral valve is normal in structure. No evidence of mitral valve  regurgitation.   4. The aortic valve is grossly normal. Aortic valve regurgitation is not  visualized.   Comparison(s): The left ventricular function is  unchanged.   FINDINGS   Left Ventricle: Left ventricular ejection fraction, by estimation, is 55  to 60%. The left ventricle has normal function. The left ventricle has no  regional wall motion abnormalities. The left ventricular internal cavity  size was normal in size. There is   no left ventricular hypertrophy. Left ventricular diastolic parameters  were normal.   Right Ventricle: The right ventricular size is mildly enlarged. Right  vetricular wall thickness was not assessed. Right ventricular systolic  function is low normal.   Left Atrium: Left atrial size was normal in size.   Right Atrium: Right atrial size was normal in size.   Pericardium: There is no evidence of pericardial effusion.   Mitral Valve: The mitral valve is normal in structure. No evidence of  mitral valve regurgitation.  Tricuspid Valve: The tricuspid valve is normal in structure. Tricuspid  valve regurgitation is trivial.   Aortic Valve: The aortic valve is grossly normal. Aortic valve  regurgitation is not visualized.   Pulmonic Valve: The pulmonic valve was grossly normal. Pulmonic valve  regurgitation is not visualized.   Aorta: The aortic root and ascending aorta are structurally normal, with  no evidence of dilitation.   IAS/Shunts: The interatrial septum was not assessed.    EKG:  EKG is  ordered today.  The ekg ordered today demonstrates NSR rate 72 bpm  Recent Labs: No results found for requested labs within last 365 days.  Recent Lipid Panel    Component Value Date/Time   CHOL 78 02/18/2020 1037   CHOL 156 11/02/2019 1523   TRIG 102 02/18/2020 1037   HDL 29 (L) 02/18/2020 1037   HDL 36 (L) 11/02/2019 1523   CHOLHDL 2.7 02/18/2020 1037   VLDL 20 02/18/2020 1037   LDLCALC 29 02/18/2020 1037   LDLCALC 78 11/02/2019 1523    Home Medications   Current Meds  Medication Sig   acetaminophen (TYLENOL) 500 MG tablet Take 2 tablets (1,000 mg total) by mouth every 6 (six) hours as  needed for moderate pain or headache.   aspirin EC 81 MG tablet Take 1 tablet (81 mg total) by mouth daily.   atorvastatin (LIPITOR) 80 MG tablet Take 80 mg by mouth daily.   cyanocobalamin (VITAMIN B12) 1000 MCG tablet Take 1,000 mcg by mouth daily.   Empagliflozin-linaGLIPtin (GLYXAMBI) 25-5 MG TABS Take by mouth daily.   ezetimibe (ZETIA) 10 MG tablet Take 1 tablet (10 mg total) by mouth daily.   fenofibrate (TRICOR) 145 MG tablet Take 1 tablet (145 mg total) by mouth daily.   furosemide (LASIX) 20 MG tablet Take 20 mg by mouth daily.   glimepiride (AMARYL) 4 MG tablet Take 4 mg by mouth 2 (two) times daily.   lisinopril (ZESTRIL) 5 MG tablet Take 5 mg by mouth daily.   Magnesium 250 MG TABS Take 1 tablet by mouth daily at 6 (six) AM.   metFORMIN (GLUCOPHAGE) 500 MG tablet Take 1 tablet by mouth 2 (two) times daily with a meal.   nitroGLYCERIN (NITROSTAT) 0.4 MG SL tablet DISSOLVE 1 TABLET UNDER THE  TONGUE EVERY 5 MINUTES AS NEEDED FOR CHEST PAIN. MAX OF 3 TABLETS IN 15 MINUTES. CALL 911 IF PAIN  PERSISTS.   VITAMIN D PO Take 50 mcg by mouth daily at 6 (six) AM.   [DISCONTINUED] fenofibrate (TRICOR) 48 MG tablet Take 1 tablet (48 mg total) by mouth daily.     Review of Systems      All other systems reviewed and are otherwise negative except as noted above.  Physical Exam    VS:  BP (!) 100/58   Pulse 87   Ht 4\' 11"  (1.499 m)   Wt 187 lb 3.2 oz (84.9 kg)   SpO2 97%   BMI 37.81 kg/m  , BMI Body mass index is 37.81 kg/m.  Wt Readings from Last 3 Encounters:  12/04/22 187 lb 3.2 oz (84.9 kg)  10/12/22 183 lb (83 kg)  10/19/21 190 lb (86.2 kg)     GEN: Well nourished, well developed, in no acute distress. HEENT: normal. Neck: Supple, no JVD, carotid bruits, or masses. Cardiac: RRR, no murmurs, rubs, or gallops. No clubbing, cyanosis, edema.  Radials/PT 2+ and equal bilaterally.  Respiratory:  Respirations regular and unlabored, clear to auscultation bilaterally. GI: Soft,  nontender,  nondistended. MS: No deformity or atrophy. Skin: Warm and dry, no rash. Neuro:  Strength and sensation are intact. Psych: Normal affect.  Assessment & Plan    CAD -n0 chest pain/pressure -has continued DOE, recommend pulm workup with PCP -patient has remote hx of CABG and stenting -Continue aspirin 81 mg daily, Lipitor 80 mg daily, Jardiance 10 mg daily, Zetia 10 mg daily, fenofibrate (increase to 145 mg daily), Lasix 20 mg daily, Imdur 60 mg daily, lisinopril 5 mg daily, Lopressor 25 mg twice a day  HTN -Low normal today -Continue to monitor at home and if remains low will discontinue her lisinopril -continue current medications for now  HLD -Continue Lipitor 80 mg daily, Zetia 10 mg daily, and fenofibrate 145 mg daily -Most recent labs a month ago showed LDL 43, triglycerides 192  -repeat lipid panel   Chronic diastolic CHF -euvolemic on exam -continue current medications       Disposition: Follow up 6 month following testing with Armanda Magic, MD or APP.  Signed, Sharlene Dory, PA-C 12/04/2022, 2:02 PM [facility]

## 2022-12-04 ENCOUNTER — Encounter: Payer: Self-pay | Admitting: Physician Assistant

## 2022-12-04 ENCOUNTER — Ambulatory Visit: Payer: Medicare Other | Attending: Physician Assistant | Admitting: Physician Assistant

## 2022-12-04 VITALS — BP 100/58 | HR 87 | Ht 59.0 in | Wt 187.2 lb

## 2022-12-04 DIAGNOSIS — E785 Hyperlipidemia, unspecified: Secondary | ICD-10-CM

## 2022-12-04 DIAGNOSIS — I2510 Atherosclerotic heart disease of native coronary artery without angina pectoris: Secondary | ICD-10-CM | POA: Diagnosis not present

## 2022-12-04 DIAGNOSIS — I5032 Chronic diastolic (congestive) heart failure: Secondary | ICD-10-CM

## 2022-12-04 DIAGNOSIS — I10 Essential (primary) hypertension: Secondary | ICD-10-CM | POA: Diagnosis not present

## 2022-12-04 MED ORDER — FENOFIBRATE 145 MG PO TABS: 145.0000 mg | ORAL_TABLET | Freq: Every day | ORAL | 3 refills | Status: DC

## 2022-12-04 NOTE — Patient Instructions (Signed)
Medication Instructions:  1.Increase fenofibrate to 145 mg daily *If you need a refill on your cardiac medications before your next appointment, please call your pharmacy*  Lab Work: Fasting lipids and lft's in 3 months If you have labs (blood work) drawn today and your tests are completely normal, you will receive your results only by: MyChart Message (if you have MyChart) OR A paper copy in the mail If you have any lab test that is abnormal or we need to change your treatment, we will call you to review the results.  Follow-Up: At [facility], you and your health needs are our priority.  As part of our continuing mission to provide you with exceptional heart care, we have created designated Provider Care Teams.  These Care Teams include your primary Cardiologist (physician) and Advanced Practice Providers (APPs -  Physician Assistants and Nurse Practitioners) who all work together to provide you with the care you need, when you need it.  We recommend signing up for the patient portal called "MyChart".  Sign up information is provided on this After Visit Summary.  MyChart is used to connect with patients for Virtual Visits (Telemedicine).  Patients are able to view lab/test results, encounter notes, upcoming appointments, etc.  Non-urgent messages can be sent to your provider as well.   To learn more about what you can do with MyChart, go to [email or web address].    Your next appointment:   6 month(s)  Provider:   Armanda Magic, MD    Other Instructions 1.Check your blood pressure daily, 1 hr after morning medications for 2 weeks, keep a log and bring it with you to your next office visit. If your blood pressure is less than 100 on the top number for a few readings in a row call and let us know.  Heart-Healthy Eating Plan Many factors influence your heart health, including eating and exercise habits. Heart health is also called coronary health. Coronary risk increases with  abnormal blood fat (lipid) levels. A heart-healthy eating plan includes limiting unhealthy fats, increasing healthy fats, limiting salt (sodium) intake, and making other diet and lifestyle changes. What is my plan? Your health care provider may recommend that: You limit your fat intake to _________% or less of your total calories each day. You limit your saturated fat intake to _________% or less of your total calories each day. You limit the amount of cholesterol in your diet to less than _________ mg per day. You limit the amount of sodium in your diet to less than _________ mg per day. What are tips for following this plan? Cooking Cook foods using methods other than frying. Baking, boiling, grilling, and broiling are all good options. Other ways to reduce fat include: Removing the skin from poultry. Removing all visible fats from meats. Steaming vegetables in water or broth. Meal planning  At meals, imagine dividing your plate into fourths: Fill one-half of your plate with vegetables and green salads. Fill one-fourth of your plate with whole grains. Fill one-fourth of your plate with lean protein foods. Eat 2-4 cups of vegetables per day. One cup of vegetables equals 1 cup (91 g) broccoli or cauliflower florets, 2 medium carrots, 1 large bell pepper, 1 large sweet potato, 1 large tomato, 1 medium white potato, 2 cups (150 g) raw leafy greens. Eat 1-2 cups of fruit per day. One cup of fruit equals 1 small apple, 1 large banana, 1 cup (237 g) mixed fruit, 1 large orange,  cup (82 g)  dried fruit, 1 cup (240 mL) 100% fruit juice. Eat more foods that contain soluble fiber. Examples include apples, broccoli, carrots, beans, peas, and barley. Aim to get 25-30 g of fiber per day. Increase your consumption of legumes, nuts, and seeds to 4-5 servings per week. One serving of dried beans or legumes equals  cup (90 g) cooked, 1 serving of nuts is  oz (12 almonds, 24 pistachios, or 7 walnut  halves), and 1 serving of seeds equals  oz (8 g). Fats Choose healthy fats more often. Choose monounsaturated and polyunsaturated fats, such as olive and canola oils, avocado oil, flaxseeds, walnuts, almonds, and seeds. Eat more omega-3 fats. Choose salmon, mackerel, sardines, tuna, flaxseed oil, and ground flaxseeds. Aim to eat fish at least 2 times each week. Check food labels carefully to identify foods with trans fats or high amounts of saturated fat. Limit saturated fats. These are found in animal products, such as meats, butter, and cream. Plant sources of saturated fats include palm oil, palm kernel oil, and coconut oil. Avoid foods with partially hydrogenated oils in them. These contain trans fats. Examples are stick margarine, some tub margarines, cookies, crackers, and other baked goods. Avoid fried foods. General information Eat more home-cooked food and less restaurant, buffet, and fast food. Limit or avoid alcohol. Limit foods that are high in added sugar and simple starches such as foods made using white refined flour (white breads, pastries, sweets). Lose weight if you are overweight. Losing just 5-10% of your body weight can help your overall health and prevent diseases such as diabetes and heart disease. Monitor your sodium intake, especially if you have high blood pressure. Talk with your health care provider about your sodium intake. Try to incorporate more vegetarian meals weekly. What foods should I eat? Fruits All fresh, canned (in natural juice), or frozen fruits. Vegetables Fresh or frozen vegetables (raw, steamed, roasted, or grilled). Green salads. Grains Most grains. Choose whole wheat and whole grains most of the time. Rice and pasta, including brown rice and pastas made with whole wheat. Meats and other proteins Lean, well-trimmed beef, veal, pork, and lamb. Chicken and [place] without skin. All fish and shellfish. Wild duck, rabbit, pheasant, and venison. Egg  whites or low-cholesterol egg substitutes. Dried beans, peas, lentils, and tofu. Seeds and most nuts. Dairy Low-fat or nonfat cheeses, including ricotta and mozzarella. Skim or 1% milk (liquid, powdered, or evaporated). Buttermilk made with low-fat milk. Nonfat or low-fat yogurt. Fats and oils Non-hydrogenated (trans-free) margarines. Vegetable oils, including soybean, sesame, sunflower, olive, avocado, peanut, safflower, corn, canola, and cottonseed. Salad dressings or mayonnaise made with a vegetable oil. Beverages Water (mineral or sparkling). Coffee and tea. Unsweetened ice tea. Diet beverages. Sweets and desserts Sherbet, gelatin, and fruit ice. Small amounts of dark chocolate. Limit all sweets and desserts. Seasonings and condiments All seasonings and condiments. The items listed above may not be a complete list of foods and beverages you can eat. Contact a dietitian for more options. What foods should I avoid? Fruits Canned fruit in heavy syrup. Fruit in cream or butter sauce. Fried fruit. Limit coconut. Vegetables Vegetables cooked in cheese, cream, or butter sauce. Fried vegetables. Grains Breads made with saturated or trans fats, oils, or whole milk. Croissants. Sweet rolls. Donuts. High-fat crackers, such as cheese crackers and chips. Meats and other proteins Fatty meats, such as hot dogs, ribs, sausage, bacon, rib-eye roast or steak. High-fat deli meats, such as salami and bologna. Caviar. Domestic duck and goose. Organ  meats, such as liver. Dairy Cream, sour cream, cream cheese, and creamed cottage cheese. Whole-milk cheeses. Whole or 2% milk (liquid, evaporated, or condensed). Whole buttermilk. Cream sauce or high-fat cheese sauce. Whole-milk yogurt. Fats and oils Meat fat, or shortening. Cocoa butter, hydrogenated oils, palm oil, coconut oil, palm kernel oil. Solid fats and shortenings, including bacon fat, salt pork, lard, and butter. Nondairy cream substitutes. Salad  dressings with cheese or sour cream. Beverages Regular sodas and any drinks with added sugar. Sweets and desserts Frosting. Pudding. Cookies. Cakes. Pies. Milk chocolate or white chocolate. Buttered syrups. Full-fat ice cream or ice cream drinks. The items listed above may not be a complete list of foods and beverages to avoid. Contact a dietitian for more information. Summary Heart-healthy meal planning includes limiting unhealthy fats, increasing healthy fats, limiting salt (sodium) intake and making other diet and lifestyle changes. Lose weight if you are overweight. Losing just 5-10% of your body weight can help your overall health and prevent diseases such as diabetes and heart disease. Focus on eating a balance of foods, including fruits and vegetables, low-fat or nonfat dairy, lean protein, nuts and legumes, whole grains, and heart-healthy oils and fats. This information is not intended to replace advice given to you by your health care provider. Make sure you discuss any questions you have with your health care provider. Document Revised: 07/24/2021 Document Reviewed: 07/24/2021 Elsevier Patient Education  2024 [employer]  Low-Sodium Eating Plan Salt (sodium) helps you keep a healthy balance of fluids in your body. Too much sodium can raise your blood pressure. It can also cause fluid and waste to be held in your body. Your health care provider or dietitian may recommend a low-sodium eating plan if you have high blood pressure (hypertension), kidney disease, liver disease, or heart failure. Eating less sodium can help lower your blood pressure and reduce swelling. It can also protect your heart, liver, and kidneys. What are tips for following this plan? Reading food labels  Check food labels for the amount of sodium per serving. If you eat more than one serving, you must multiply the listed amount by the number of servings. Choose foods with less than 140 milligrams (mg) of sodium  per serving. Avoid foods with 300 mg of sodium or more per serving. Always check how much sodium is in a product, even if the label says "unsalted" or "no salt added." Shopping  Buy products labeled as "low-sodium" or "no salt added." Buy fresh foods. Avoid canned foods and pre-made or frozen meals. Avoid canned, cured, or processed meats. Buy breads that have less than 80 mg of sodium per slice. Cooking  Eat more home-cooked food. Try to eat less restaurant, buffet, and fast food. Try not to add salt when you cook. Use salt-free seasonings or herbs instead of table salt or sea salt. Check with your provider or pharmacist before using salt substitutes. Cook with plant-based oils, such as canola, sunflower, or olive oil. Meal planning When eating at a restaurant, ask if your food can be made with less salt or no salt. Avoid dishes labeled as brined, pickled, cured, or smoked. Avoid dishes made with soy sauce, miso, or teriyaki sauce. Avoid foods that have monosodium glutamate (MSG) in them. MSG may be added to some restaurant food, sauces, soups, bouillon, and canned foods. Make meals that can be grilled, baked, poached, roasted, or steamed. These are often made with less sodium. General information Try to limit your sodium intake to 1,500-2,300 mg  each day, or the amount told by your provider. What foods should I eat? Fruits Fresh, frozen, or canned fruit. Fruit juice. Vegetables Fresh or frozen vegetables. "No salt added" canned vegetables. "No salt added" tomato sauce and paste. Low-sodium or reduced-sodium tomato and vegetable juice. Grains Low-sodium cereals, such as oats, puffed wheat and rice, and shredded wheat. Low-sodium crackers. Unsalted rice. Unsalted pasta. Low-sodium bread. Whole grain breads and whole grain pasta. Meats and other proteins Fresh or frozen meat, poultry, seafood, and fish. These should have no added salt. Low-sodium canned tuna and salmon. Unsalted nuts.  Dried peas, beans, and lentils without added salt. Unsalted canned beans. Eggs. Unsalted nut butters. Dairy Milk. Soy milk. Cheese that is naturally low in sodium, such as ricotta cheese, fresh mozzarella, or Swiss cheese. Low-sodium or reduced-sodium cheese. Cream cheese. Yogurt. Seasonings and condiments Fresh and dried herbs and spices. Salt-free seasonings. Low-sodium mustard and ketchup. Sodium-free salad dressing. Sodium-free light mayonnaise. Fresh or refrigerated horseradish. Lemon juice. Vinegar. Other foods Homemade, reduced-sodium, or low-sodium soups. Unsalted popcorn and pretzels. Low-salt or salt-free chips. The items listed above may not be all the foods and drinks you can have. Talk to a dietitian to learn more. What foods should I avoid? Vegetables Sauerkraut, pickled vegetables, and relishes. Olives. [place] fries. Onion rings. Regular canned vegetables, except low-sodium or reduced-sodium items. Regular canned tomato sauce and paste. Regular tomato and vegetable juice. Frozen vegetables in sauces. Grains Instant hot cereals. Bread stuffing, pancake, and biscuit mixes. Croutons. Seasoned rice or pasta mixes. Noodle soup cups. Boxed or frozen macaroni and cheese. Regular salted crackers. Self-rising flour. Meats and other proteins Meat or fish that is salted, canned, smoked, spiced, or pickled. Precooked or cured meat, such as sausages or meat loaves. Tomasa Blase. Ham. Pepperoni. Hot dogs. Corned beef. Chipped beef. Salt pork. Jerky. Pickled herring, anchovies, and sardines. Regular canned tuna. Salted nuts. Dairy Processed cheese and cheese spreads. Hard cheeses. Cheese curds. Blue cheese. Feta cheese. String cheese. Regular cottage cheese. Buttermilk. Canned milk. Fats and oils Salted butter. Regular margarine. Ghee. Bacon fat. Seasonings and condiments Onion salt, garlic salt, seasoned salt, table salt, and sea salt. Canned and packaged gravies. Worcestershire sauce. Tartar sauce.  Barbecue sauce. Teriyaki sauce. Soy sauce, including reduced-sodium soy sauce. Steak sauce. Fish sauce. Oyster sauce. Cocktail sauce. Horseradish that you find on the shelf. Regular ketchup and mustard. Meat flavorings and tenderizers. Bouillon cubes. Hot sauce. Pre-made or packaged marinades. Pre-made or packaged taco seasonings. Relishes. Regular salad dressings. Salsa. Other foods Salted popcorn and pretzels. Corn chips and puffs. Potato and tortilla chips. Canned or dried soups. Pizza. Frozen entrees and pot pies. The items listed above may not be all the foods and drinks you should avoid. Talk to a dietitian to learn more. This information is not intended to replace advice given to you by your health care provider. Make sure you discuss any questions you have with your health care provider. Document Revised: 07/05/2022 Document Reviewed: 07/05/2022 Elsevier Patient Education  2024 [employer].

## 2023-01-02 DIAGNOSIS — H25813 Combined forms of age-related cataract, bilateral: Secondary | ICD-10-CM | POA: Diagnosis not present

## 2023-01-02 DIAGNOSIS — E119 Type 2 diabetes mellitus without complications: Secondary | ICD-10-CM | POA: Diagnosis not present

## 2023-01-02 DIAGNOSIS — H40013 Open angle with borderline findings, low risk, bilateral: Secondary | ICD-10-CM | POA: Diagnosis not present

## 2023-01-02 DIAGNOSIS — H02831 Dermatochalasis of right upper eyelid: Secondary | ICD-10-CM | POA: Diagnosis not present

## 2023-01-02 DIAGNOSIS — H02834 Dermatochalasis of left upper eyelid: Secondary | ICD-10-CM | POA: Diagnosis not present

## 2023-02-14 DIAGNOSIS — G4733 Obstructive sleep apnea (adult) (pediatric): Secondary | ICD-10-CM | POA: Diagnosis not present

## 2023-02-14 DIAGNOSIS — E1122 Type 2 diabetes mellitus with diabetic chronic kidney disease: Secondary | ICD-10-CM | POA: Diagnosis not present

## 2023-02-14 DIAGNOSIS — I130 Hypertensive heart and chronic kidney disease with heart failure and stage 1 through stage 4 chronic kidney disease, or unspecified chronic kidney disease: Secondary | ICD-10-CM | POA: Diagnosis not present

## 2023-02-14 DIAGNOSIS — I2510 Atherosclerotic heart disease of native coronary artery without angina pectoris: Secondary | ICD-10-CM | POA: Diagnosis not present

## 2023-02-14 DIAGNOSIS — M8580 Other specified disorders of bone density and structure, unspecified site: Secondary | ICD-10-CM | POA: Diagnosis not present

## 2023-02-14 DIAGNOSIS — I5022 Chronic systolic (congestive) heart failure: Secondary | ICD-10-CM | POA: Diagnosis not present

## 2023-02-14 DIAGNOSIS — N183 Chronic kidney disease, stage 3 unspecified: Secondary | ICD-10-CM | POA: Diagnosis not present

## 2023-03-05 ENCOUNTER — Ambulatory Visit: Payer: Medicare Other | Attending: Physician Assistant

## 2023-03-12 DIAGNOSIS — N1832 Chronic kidney disease, stage 3b: Secondary | ICD-10-CM | POA: Diagnosis not present

## 2023-03-12 DIAGNOSIS — Z23 Encounter for immunization: Secondary | ICD-10-CM | POA: Diagnosis not present

## 2023-03-19 DIAGNOSIS — N1832 Chronic kidney disease, stage 3b: Secondary | ICD-10-CM | POA: Diagnosis not present

## 2023-04-04 ENCOUNTER — Ambulatory Visit
Admission: RE | Admit: 2023-04-04 | Discharge: 2023-04-04 | Disposition: A | Discharge status: Home / Self Care | Payer: Medicare Other | Source: Ambulatory Visit | Attending: Family Medicine | Admitting: Family Medicine

## 2023-04-04 DIAGNOSIS — E349 Endocrine disorder, unspecified: Secondary | ICD-10-CM | POA: Diagnosis not present

## 2023-04-04 DIAGNOSIS — M8588 Other specified disorders of bone density and structure, other site: Secondary | ICD-10-CM | POA: Diagnosis not present

## 2023-04-04 DIAGNOSIS — E2839 Other primary ovarian failure: Secondary | ICD-10-CM

## 2023-04-05 DIAGNOSIS — N1832 Chronic kidney disease, stage 3b: Secondary | ICD-10-CM | POA: Diagnosis not present

## 2023-04-05 DIAGNOSIS — N189 Chronic kidney disease, unspecified: Secondary | ICD-10-CM | POA: Diagnosis not present

## 2023-04-05 DIAGNOSIS — I129 Hypertensive chronic kidney disease with stage 1 through stage 4 chronic kidney disease, or unspecified chronic kidney disease: Secondary | ICD-10-CM | POA: Diagnosis not present

## 2023-04-05 DIAGNOSIS — E785 Hyperlipidemia, unspecified: Secondary | ICD-10-CM | POA: Diagnosis not present

## 2023-04-05 DIAGNOSIS — I509 Heart failure, unspecified: Secondary | ICD-10-CM | POA: Diagnosis not present

## 2023-04-05 DIAGNOSIS — E1129 Type 2 diabetes mellitus with other diabetic kidney complication: Secondary | ICD-10-CM | POA: Diagnosis not present

## 2023-04-05 DIAGNOSIS — I2510 Atherosclerotic heart disease of native coronary artery without angina pectoris: Secondary | ICD-10-CM | POA: Diagnosis not present

## 2023-04-05 DIAGNOSIS — N179 Acute kidney failure, unspecified: Secondary | ICD-10-CM | POA: Diagnosis not present

## 2023-05-02 DIAGNOSIS — M6281 Muscle weakness (generalized): Secondary | ICD-10-CM | POA: Diagnosis not present

## 2023-05-07 ENCOUNTER — Other Ambulatory Visit: Payer: Self-pay | Admitting: Cardiology

## 2023-05-07 DIAGNOSIS — M6281 Muscle weakness (generalized): Secondary | ICD-10-CM | POA: Diagnosis not present

## 2023-05-09 DIAGNOSIS — M6281 Muscle weakness (generalized): Secondary | ICD-10-CM | POA: Diagnosis not present

## 2023-05-15 DIAGNOSIS — N179 Acute kidney failure, unspecified: Secondary | ICD-10-CM | POA: Diagnosis not present

## 2023-05-21 DIAGNOSIS — E1122 Type 2 diabetes mellitus with diabetic chronic kidney disease: Secondary | ICD-10-CM | POA: Diagnosis not present

## 2023-05-24 ENCOUNTER — Telehealth: Payer: Self-pay | Admitting: *Deleted

## 2023-05-24 ENCOUNTER — Encounter: Payer: Self-pay | Admitting: Cardiology

## 2023-05-24 ENCOUNTER — Ambulatory Visit: Payer: Medicare Other | Attending: Cardiology | Admitting: Cardiology

## 2023-05-24 VITALS — BP 110/68 | HR 72 | Ht 59.0 in | Wt 179.0 lb

## 2023-05-24 DIAGNOSIS — I5032 Chronic diastolic (congestive) heart failure: Secondary | ICD-10-CM | POA: Diagnosis not present

## 2023-05-24 DIAGNOSIS — G4733 Obstructive sleep apnea (adult) (pediatric): Secondary | ICD-10-CM

## 2023-05-24 DIAGNOSIS — E785 Hyperlipidemia, unspecified: Secondary | ICD-10-CM | POA: Diagnosis not present

## 2023-05-24 DIAGNOSIS — I10 Essential (primary) hypertension: Secondary | ICD-10-CM

## 2023-05-24 DIAGNOSIS — E119 Type 2 diabetes mellitus without complications: Secondary | ICD-10-CM | POA: Diagnosis not present

## 2023-05-24 DIAGNOSIS — Z79899 Other long term (current) drug therapy: Secondary | ICD-10-CM | POA: Diagnosis not present

## 2023-05-24 DIAGNOSIS — I2510 Atherosclerotic heart disease of native coronary artery without angina pectoris: Secondary | ICD-10-CM | POA: Diagnosis not present

## 2023-05-24 MED ORDER — RANOLAZINE ER 500 MG PO TB12: 500.0000 mg | ORAL_TABLET | Freq: Two times a day (BID) | ORAL | 3 refills | Status: DC

## 2023-05-24 NOTE — Progress Notes (Signed)
Cardiology Office Note:    Date:  05/24/2023   ID:  [name], DOB Nov 28, 1950, MRN [id number]  PCP:  Kelli Brunette, MD  Cardiologist:  Armanda Magic, MD    Referring MD: Kelli Brunette, MD   Chief Complaint  Patient presents with   Coronary Artery Disease   Hypertension   Congestive Heart Failure   Hyperlipidemia    History of Present Illness:    [name] is a 72 y.o. female with a hx of  CAD (LAD stent 2007, CABG in 07/2006, Endeavor stent to diagonal 12/2006, cath 2010 with small vessel dz, stable cath 07/2013, s/p DES to the LIMA-LAD in 04/2016, LHC 12/31/16 due to CP showed patent LIMA-LAD, known occlusion of SVG-diagonal and normal LVF - CP felt due to GERD and was placed back on PPI),  HTN,  chronic LE (at end of the day), chronic CHF.  She has chronic CP felt related to microvascular angina.  She is not on Plavix due to financial constraints.    She was seen by Kelli Favre, PA June 2024 and was complaining of sharp chest pain which would then turn to pressure in the center of her chest but was not exertional but was associate with some diaphoresis and shortness of breath.  She also complained of shortness of breath when shopping.  She underwent a stress PET CT which showed no ischemia.    She is here today for followup.  She continues to have episode dull chest pain and usually comes on with stress. She has a lot of stress at home.  She also gets it with exertion.   Sometimes with break out in a sweat. She has chronic DOE which is unchanged.  She has chronic LE edema that is very mild in the evenings. She denies any  PND, orthopnea,  dizziness, palpitations or syncope. She is compliant with her meds and is tolerating meds with no SE.     Past Medical History:  Diagnosis Date   Anemia    [facility] I've had transfusion for low iron (04/17/2016)   Anxiety    Arthritis    "left hip, fingers"  (04/17/2016)   At risk for medication noncompliance     Atypical chest pain    a. atypical symptoms since prior cath.   CAD (coronary artery disease)    a. s/p 2007  LAD stent. b. s/p 07/2006 CABG with LIMA-LAD & SVG to Diag. c. 12/2006 diagonal branch w/ 2.5 x 12 mm Endeavor stent. d. s/p 2010 cath with small vessel disease treated with RX, stable by repeat 2015. e. CP/abnl nuc -> LHC 04/17/16: new ostial stenosis of LIMA-LAD s/p DES, EF 45%, chronically occ SVG-D2. f. Stable cath 12/2016.   Chronic combined systolic and diastolic CHF (congestive heart failure) (HCC)    CKD (chronic kidney disease), stage II    Depression    GERD (gastroesophageal reflux disease)    Headache(784.0)    "maybe once/month" (04/17/2016)   Hyperlipidemia    Hypertension    Migraines    "couple times/yr"  ((04/17/2016)   Myocardial infarction [facility]) ?2008   Obesity    OSA on CPAP    "suppose to wear mask; can't find it" (10/17//2017)   Osteopenia 12/2010 &01/21/2013   Minimal of hip DEXA    Pneumonia    "several years" (04/17/2016)   Retinopathy 12/08   right   RLS (restless legs syndrome)    Thyroid disease    "  blood work didn't show it but my dr said he can tell by looking at me; I had radiation" (04/17/2016)   Type II diabetes mellitus [facility])     Past Surgical History:  Procedure Laterality Date   CARDIAC CATHETERIZATION  02/03/2008   for CP patent LIMA w mid-vessel spasm.   CARDIAC CATHETERIZATION N/A 04/17/2016   Procedure: Left Heart Cath and Cors/Grafts Angiography;  Surgeon: Peter M Swaziland, MD;  Location: [facility] INVASIVE CV LAB;  Service: Cardiovascular;  Laterality: N/A;   CARDIAC CATHETERIZATION N/A 04/17/2016   Procedure: Coronary Stent Intervention;  Surgeon: Peter M Swaziland, MD;  Location: [facility] INVASIVE CV LAB;  Service: Cardiovascular;  Laterality: N/A;   COLONOSCOPY  10/2010   rectal polyp   CORONARY ANGIOPLASTY WITH STENT PLACEMENT     "I've had quite a few stents"    CORONARY ANGIOPLASTY WITH STENT PLACEMENT  04/17/2016   CORONARY ARTERY BYPASS GRAFT   ?2008   s/p two vessel CABG off pump LAD diagonal and PTCA/DE Stent mid LAD/ DE stent LAD diagonal through previous stent sstruts,occluded SVG-diagonal   LAPAROSCOPIC CHOLECYSTECTOMY  1990's   LEFT HEART CATH AND CORS/GRAFTS ANGIOGRAPHY N/A 12/31/2016   Procedure: Left Heart Cath and Cors/Grafts Angiography;  Surgeon: Swaziland, Peter M, MD;  Location: [facility] INVASIVE CV LAB;  Service: Cardiovascular;  Laterality: N/A;   LEFT HEART CATHETERIZATION WITH CORONARY ANGIOGRAM N/A 07/15/2013   Procedure: LEFT HEART CATHETERIZATION WITH CORONARY ANGIOGRAM;  Surgeon: Peter M Swaziland, MD;  Location: [facility] CATH LAB;  Service: Cardiovascular;  Laterality: N/A;   TUBAL LIGATION  1976    Current Medications: Current Meds  Medication Sig   acetaminophen (TYLENOL) 500 MG tablet Take 2 tablets (1,000 mg total) by mouth every 6 (six) hours as needed for moderate pain or headache.   aspirin EC 81 MG tablet Take 1 tablet (81 mg total) by mouth daily.   atorvastatin (LIPITOR) 80 MG tablet Take 80 mg by mouth daily.   cyanocobalamin (VITAMIN B12) 1000 MCG tablet Take 1,000 mcg by mouth daily.   Empagliflozin-linaGLIPtin (GLYXAMBI) 25-5 MG TABS Take by mouth daily.   ezetimibe (ZETIA) 10 MG tablet TAKE 1 TABLET BY MOUTH DAILY   fenofibrate (TRICOR) 145 MG tablet Take 1 tablet (145 mg total) by mouth daily.   furosemide (LASIX) 20 MG tablet Take 20 mg by mouth daily.   glimepiride (AMARYL) 4 MG tablet Take 4 mg by mouth 2 (two) times daily.   isosorbide mononitrate (IMDUR) 60 MG 24 hr tablet Take 1 tablet (60 mg total) by mouth daily.   Magnesium 250 MG TABS Take 1 tablet by mouth daily at 6 (six) AM.   metFORMIN (GLUCOPHAGE) 500 MG tablet Take 1 tablet by mouth 2 (two) times daily with a meal.   metoprolol tartrate (LOPRESSOR) 25 MG tablet Take 1 tablet (25 mg total) by mouth 2 (two) times daily.   nitroGLYCERIN (NITROSTAT) 0.4 MG SL tablet DISSOLVE 1 TABLET UNDER THE  TONGUE EVERY 5 MINUTES AS NEEDED FOR CHEST PAIN. MAX OF  3 TABLETS IN 15 MINUTES. CALL 911 IF PAIN  PERSISTS.   VITAMIN D PO Take 50 mcg by mouth daily at 6 (six) AM.     Allergies:   Patient has no known allergies.   Social History   Socioeconomic History   Marital status: Married    Spouse name: Not on file   Number of children: Not on file   Years of education: Not on file   Highest education level: Not on  file  Occupational History   Not on file  Tobacco Use   Smoking status: Former    Current packs/day: 0.00    Types: Cigarettes    Start date: 07/02/1964    Quit date: 07/02/1989    Years since quitting: 33.9   Smokeless tobacco: Never   Tobacco comments:    "smoked 2-3 cigarettes/week"  Substance and Sexual Activity   Alcohol use: No   Drug use: No   Sexual activity: Yes  Other Topics Concern   Not on file  Social History Narrative   Not on file   Social Determinants of Health   Financial Resource Strain: Not on file  Food Insecurity: Not on file  Transportation Needs: Not on file  Physical Activity: Not on file  Stress: Not on file  Social Connections: Not on file     Family History: The patient's family history includes Alzheimer's disease in her maternal grandmother; Cancer in her father and mother; Diabetes in her maternal grandmother and mother; Hypertension in her mother; Sleep apnea in her daughter and son.  ROS:   Please see the history of present illness.    ROS  All other systems reviewed and negative.   EKGs/Labs/Other Studies Reviewed:    The following studies were reviewed today: none  Recent Labs: No results found for requested labs within last 365 days.   Recent Lipid Panel    Component Value Date/Time   CHOL 78 02/18/2020 1037   CHOL 156 11/02/2019 1523   TRIG 102 02/18/2020 1037   HDL 29 (L) 02/18/2020 1037   HDL 36 (L) 11/02/2019 1523   CHOLHDL 2.7 02/18/2020 1037   VLDL 20 02/18/2020 1037   LDLCALC 29 02/18/2020 1037   LDLCALC 78 11/02/2019 1523    Physical Exam:    VS:  BP  110/68   Pulse 72   Ht 4\' 11"  (1.499 m)   Wt 179 lb (81.2 kg)   BMI 36.15 kg/m     Wt Readings from Last 3 Encounters:  05/24/23 179 lb (81.2 kg)  12/04/22 187 lb 3.2 oz (84.9 kg)  10/12/22 183 lb (83 kg)     GEN: Well nourished, well developed in no acute distress HEENT: Normal NECK: No JVD; No carotid bruits LYMPHATICS: No lymphadenopathy CARDIAC:RRR, no murmurs, rubs, gallops RESPIRATORY:  Clear to auscultation without rales, wheezing or rhonchi  ABDOMEN: Soft, non-tender, non-distended MUSCULOSKELETAL:  trace LE edema; No deformity  SKIN: Warm and dry NEUROLOGIC:  Alert and oriented x 3 PSYCHIATRIC:  Normal affect  ASSESSMENT:    1. Coronary artery disease involving native coronary artery of native heart without angina pectoris   2. Chronic diastolic CHF (congestive heart failure) (HCC)   3. Essential hypertension   4. Hyperlipidemia LDL goal <70   5. Diabetes mellitus type 2, noninsulin dependent (HCC)   6. OSA (obstructive sleep apnea)     PLAN:    In order of problems listed above:  1. CAD s/p CABG  -History of CABG x2 (LIMA-LAD and SVG-Diag) in 2008 with subsequent stenting.  -Most recent cath in 12/2016 showed patent LIMA-LAD graft with known occlusion of SVG-Diag. -Stress PET CT 2024 showed no ischemia -she continues to have a dull ache in her chest mainly when she has emotional stress and it has not changed any since she has her stress test.  It comes on mostly when she gets stressed and not as much with exertion.  I suspect this is mainly related to stress but could  be microvascular disease.  Myocardial blood flow could not be assessed during her PET CT. -start Ranexa 500mg  BID -would avoid uptitration of BB or nitrates due to soft BP and CKD -Continue prescription drug management with aspirin 81 mg daily, atorvastatin 80 mg daily, Imdur 60 mg daily and Lopressor 25 mg twice daily with as needed refills   2. Chronic Diastolic CHF/resolved ischemic  cardiomyopathy - EF-40 to 45% but normalized with LVEF of 60-65% on most recent Echo from 12/2018. - She does not appear volume overloaded on exam today - Continue drug management with Lasix 20 mg daily with as needed refills - Continue sodium/fluid restriction.   3. Hypertension - BP controlled exam today - Continue prescription drug management with Lopressor 25 mg twice daily with as needed refills   4. Hyperlipidemia -LDL goal less than 70 -I have personally reviewed and interpreted outside labs performed by patient's PCP which showed LDL 43 and HDL 36 on 08/15/2022 -Repeat FLP and ALT -Continue drug management atorvastatin 80 mg daily and Zetia 10 mg daily as well as fenofibrate 145 mg daily with as needed refills   5. Diabetes Mellitus - Hemoglobin A1c 7.5% on 02/14/2023- Managed by PCP.   6. Obstructive Sleep Apnea -She has not been using her PAP as her husband misplaced it -she had a repeat sleep study in 2022 which showed moderate OSA with an AHI of 22/hr and underwent CPAP titration -she was started on CPAP at 10cm H2O but apparently she says that she was not using it like she was supposed to and had to turn it back in.  I will call her DME to see what happened and what the next steps are to get her back on it    Medication Adjustments/Labs and Tests Ordered: Current medicines are reviewed at length with the patient today.  Concerns regarding medicines are outlined above.  No orders of the defined types were placed in this encounter.  No orders of the defined types were placed in this encounter.   Signed, Armanda Magic, MD  05/24/2023 1:44 PM    [facility]

## 2023-05-24 NOTE — Patient Instructions (Signed)
Medication Instructions:  Please START taking ranexa 500 mg twice a day.  *If you need a refill on your cardiac medications before your next appointment, please call your pharmacy*   Lab Work: Please return to our lab on a day when you have been fasting to complete your lipid panel and an ALT.  If you have labs (blood work) drawn today and your tests are completely normal, you will receive your results only by: MyChart Message (if you have MyChart) OR A paper copy in the mail If you have any lab test that is abnormal or we need to change your treatment, we will call you to review the results.   Testing/Procedures: None.   Follow-Up: At [facility], you and your health needs are our priority.  As part of our continuing mission to provide you with exceptional heart care, we have created designated Provider Care Teams.  These Care Teams include your primary Cardiologist (physician) and Advanced Practice Providers (APPs -  Physician Assistants and Nurse Practitioners) who all work together to provide you with the care you need, when you need it.  We recommend signing up for the patient portal called "MyChart".  Sign up information is provided on this After Visit Summary.  MyChart is used to connect with patients for Virtual Visits (Telemedicine).  Patients are able to view lab/test results, encounter notes, upcoming appointments, etc.  Non-urgent messages can be sent to your provider as well.   To learn more about what you can do with MyChart, go to [email or web address].    Your next appointment:   4 week(s)  Provider:   Jari Favre, PA-C. Then, please see  Armanda Magic, MD  in 6 months.

## 2023-05-24 NOTE — Telephone Encounter (Signed)
Per Dr Mayford Knife, she had a repeat sleep study in 2022 which showed moderate OSA with an AHI of 22/hr and underwent CPAP titration -she was started on CPAP at 10cm H2O but apparently she says that she was not using it like she was supposed to and had to turn it back in. please call her DME to see what happened and what the next steps are to get her back on it>>she does not know who her DME is.  she was with [employer] she has to start over with a in lab study  please order a split night sleep study   Patient notified

## 2023-05-24 NOTE — Addendum Note (Signed)
Addended by: Luellen Pucker on: 05/24/2023 02:02 PM   Modules accepted: Orders

## 2023-05-27 DIAGNOSIS — Z79899 Other long term (current) drug therapy: Secondary | ICD-10-CM | POA: Diagnosis not present

## 2023-05-27 DIAGNOSIS — E785 Hyperlipidemia, unspecified: Secondary | ICD-10-CM | POA: Diagnosis not present

## 2023-05-28 LAB — LIPID PANEL
Chol/HDL Ratio: 3.6 {ratio} (ref 0.0–4.4)
Cholesterol, Total: 112 mg/dL (ref 100–199)
HDL: 31 mg/dL — ABNORMAL LOW (ref >39)
LDL Chol Calc (NIH): 42 mg/dL (ref 0–99)
Triglycerides: 246 mg/dL — ABNORMAL HIGH (ref 0–149)
VLDL Cholesterol Cal: 39 mg/dL (ref 5–40)

## 2023-05-28 LAB — ALT: ALT: 21 [IU]/L (ref 0–32)

## 2023-05-29 ENCOUNTER — Telehealth: Payer: Self-pay

## 2023-05-29 NOTE — Telephone Encounter (Signed)
-----   Message from Armanda Magic sent at 05/28/2023  8:33 AM EST ----- TAGS high please find out if she was fasting for this

## 2023-05-29 NOTE — Telephone Encounter (Signed)
Called to discuss cholesterol labs, no answer, no VM picked up.

## 2023-06-04 ENCOUNTER — Telehealth: Payer: Self-pay

## 2023-06-04 NOTE — Telephone Encounter (Signed)
Call to patient to advise that triglyerides were elevated on her last labs. Patient confirms she was fasting. She states she does not drink alcohol and she is a diabetic so she tries to be very careful about her carb and sugar intake. Patient responses forwarded to Dr. Mayford Knife.

## 2023-06-04 NOTE — Telephone Encounter (Signed)
-----   Message from Armanda Magic sent at 05/28/2023  8:33 AM EST ----- TAGS high please find out if she was fasting for this

## 2023-06-07 ENCOUNTER — Other Ambulatory Visit (HOSPITAL_COMMUNITY): Payer: Self-pay

## 2023-06-07 ENCOUNTER — Encounter: Payer: Self-pay | Admitting: Hematology

## 2023-06-07 ENCOUNTER — Telehealth: Payer: Self-pay | Admitting: Pharmacy Technician

## 2023-06-07 MED ORDER — ICOSAPENT ETHYL 1 G PO CAPS: 2.0000 g | ORAL_CAPSULE | Freq: Two times a day (BID) | ORAL | 11 refills | Status: DC

## 2023-06-07 NOTE — Telephone Encounter (Signed)
Pharmacy Patient Advocate Encounter   Received notification from Pt Calls Messages that prior authorization for vascepa [facility]) is required/requested.   Insurance verification completed.   The patient is insured through  [employer]  .   Per test claim: The current 06/07/23 day co-pay is, $4.50- 120 capsules-one month.  No PA needed at this time. This test claim was processed through [facility]- copay amounts may vary at other pharmacies due to pharmacy/plan contracts, or as the patient moves through the different stages of their insurance plan.

## 2023-06-07 NOTE — Telephone Encounter (Signed)
Spoke to daughter, [name] does not require PA and 1 month supply cost is ~$5. Advice her to continue atorvastatin, ezetimibe and Tricor along with Vascepa 2 gm twice daily Prescription for Vascepa sent to [facility]. Follow up lipid lab and LFT due in 2-3 months.

## 2023-06-07 NOTE — Addendum Note (Signed)
Addended by: Tylene Fantasia on: 06/07/2023 12:19 PM   Modules accepted: Orders

## 2023-07-02 NOTE — Progress Notes (Deleted)
 Office Visit    Patient Name: [name] Date of Encounter: 07/02/2023  PCP:  Claudene Pellet, MD   [facility]  Cardiologist:  Wilbert Bihari, MD  Advanced Practice Provider:  No care team member to display Electrophysiologist:  None 6}  HPI    [name] is a 72 y.o. female with a past medical history of CAD, chronic diastolic CHF, probable CKD 2, DM, OSA, HTN and HLD presents today for follow-up appointment.  She has a history of CABG x 2 (LIMA to LAD and SVG to diagonal) in 2008 with subsequent stenting.  Most recent cath 12/2016 showed patent LIMA to LAD graft with known occlusion of SVG to diagonal.  LV EF 60 to 65% on most recent echocardiogram from 12/2018 (improved from 40 to 45% in 2016).  She presented for follow-up last year and did not have any regular exercise but does take care of her grandkids without any issues.  Denies shortness of breath, orthopnea, PND, syncope, lower extremity edema or melena.  Compliant with medications.  Seen by me 4/12, she tells me that over the last few weeks she is having some sharp chest pains but then turned to pressure in the center of her chest. It is not exertional but she does have some associated diaphoresis and SOB as well that's associated. She usually has to hold on the the grocery cart when she is shopping due to the SOB. She has needed to take nitro a few times over the past week. We have renewed her prescription today for this. We discussed several options including echocardiogram, stress PET, and Lexiscan  Myoview .  She was seen by me 6//24, she tells me that her blood pressure is usually a little.  She does have a way to take at home.  Typically takes it before medications.  Suggested taking an hour after morning medications to make sure is not too low. Discussed the risks of blood blood pressure dizziness/lightheadedness and increased risk of falls.  Sometimes, she does get short episodes likely  multifactorial.  Went over her cardiac PET/CT without any ischemia.  Reviewed her most recent labs.  LDL is at goal but her triglycerides are high.  Today, she ***  Past Medical History    Past Medical History:  Diagnosis Date   An[facility]    [facility] I've had transfusion for low iron (04/17/2016)   Anxiety    Arthritis    left hip, fingers  (04/17/2016)   At risk for medication noncompliance    Atypical chest pain    a. atypical symptoms since prior cath.   CAD (coronary artery disease)    a. s/p 2007  LAD stent. b. s/p 07/2006 CABG with LIMA-LAD & SVG to Diag. c. 12/2006 diagonal branch w/ 2.5 x 12 mm Endeavor stent. d. s/p 2010 cath with small vessel disease treated with RX, stable by repeat 2015. e. CP/abnl nuc -> LHC 04/17/16: new ostial stenosis of LIMA-LAD s/p DES, EF 45%, chronically occ SVG-D2. f. Stable cath 12/2016.   Chronic combined systolic and diastolic CHF (congestive heart failure) (HCC)    CKD (chronic kidney disease), stage II    Depression    GERD (gastroesophageal reflux disease)    Headache(784.0)    maybe once/month (04/17/2016)   Hyperlipidemia    Hypertension    Migraines    couple times/yr  ((04/17/2016)   Myocardial infarction [facility]) ?2008   Obesity    OSA  on CPAP    suppose to wear mask; can't find it (10/17//2017)   Osteopenia 12/2010 &01/21/2013   Minimal of hip DEXA    Pneumonia    several years (04/17/2016)   Retinopathy 12/08   right   RLS (restless legs syndrome)    Thyroid  disease    blood work didn't show it but my dr said he can tell by looking at me; I had radiation (04/17/2016)   Type II diabetes mellitus (HCC)    Past Surgical History:  Procedure Laterality Date   CARDIAC CATHETERIZATION  02/03/2008   for CP patent LIMA w mid-vessel spasm.   CARDIAC CATHETERIZATION N/A 04/17/2016   Procedure: Left Heart Cath and Cors/Grafts Angiography;  Surgeon: Peter M Jordan, MD;  Location: [facility] INVASIVE CV LAB;  Service:  Cardiovascular;  Laterality: N/A;   CARDIAC CATHETERIZATION N/A 04/17/2016   Procedure: Coronary Stent Intervention;  Surgeon: Peter M Jordan, MD;  Location: [facility] INVASIVE CV LAB;  Service: Cardiovascular;  Laterality: N/A;   COLONOSCOPY  10/2010   rectal polyp   CORONARY ANGIOPLASTY WITH STENT PLACEMENT     I've had quite a few stents    CORONARY ANGIOPLASTY WITH STENT PLACEMENT  04/17/2016   CORONARY ARTERY BYPASS GRAFT  ?2008   s/p two vessel CABG off pump LAD diagonal and PTCA/DE Stent mid LAD/ DE stent LAD diagonal through previous stent sstruts,occluded SVG-diagonal   LAPAROSCOPIC CHOLECYSTECTOMY  1990's   LEFT HEART CATH AND CORS/GRAFTS ANGIOGRAPHY N/A 12/31/2016   Procedure: Left Heart Cath and Cors/Grafts Angiography;  Surgeon: Jordan, Peter M, MD;  Location: [facility] INVASIVE CV LAB;  Service: Cardiovascular;  Laterality: N/A;   LEFT HEART CATHETERIZATION WITH CORONARY ANGIOGRAM N/A 07/15/2013   Procedure: LEFT HEART CATHETERIZATION WITH CORONARY ANGIOGRAM;  Surgeon: Peter M Jordan, MD;  Location: [facility] CATH LAB;  Service: Cardiovascular;  Laterality: N/A;   TUBAL LIGATION  1976    Allergies  No Known Allergies EKGs/Labs/Other Studies Reviewed:   The following studies were reviewed today:  Echo 02/18/20 IMPRESSIONS     1. Left ventricular ejection fraction, by estimation, is 55 to 60%. The  left ventricle has normal function. The left ventricle has no regional  wall motion abnormalities. Left ventricular diastolic parameters were  normal.   2. Right ventricular systolic function is low normal. The right  ventricular size is mildly enlarged.   3. The mitral valve is normal in structure. No evidence of mitral valve  regurgitation.   4. The aortic valve is grossly normal. Aortic valve regurgitation is not  visualized.   Comparison(s): The left ventricular function is unchanged.   FINDINGS   Left Ventricle: Left ventricular ejection fraction, by estimation, is 55  to 60%. The left  ventricle has normal function. The left ventricle has no  regional wall motion abnormalities. The left ventricular internal cavity  size was normal in size. There is   no left ventricular hypertrophy. Left ventricular diastolic parameters  were normal.   Right Ventricle: The right ventricular size is mildly enlarged. Right  vetricular wall thickness was not assessed. Right ventricular systolic  function is low normal.   Left Atrium: Left atrial size was normal in size.   Right Atrium: Right atrial size was normal in size.   Pericardium: There is no evidence of pericardial effusion.   Mitral Valve: The mitral valve is normal in structure. No evidence of  mitral valve regurgitation.   Tricuspid Valve: The tricuspid valve is normal in structure. Tricuspid  valve regurgitation is  trivial.   Aortic Valve: The aortic valve is grossly normal. Aortic valve  regurgitation is not visualized.   Pulmonic Valve: The pulmonic valve was grossly normal. Pulmonic valve  regurgitation is not visualized.   Aorta: The aortic root and ascending aorta are structurally normal, with  no evidence of dilitation.   IAS/Shunts: The interatrial septum was not assessed.    EKG:  EKG is  ordered today.  The ekg ordered today demonstrates NSR rate 72 bpm  Recent Labs: 05/27/2023: ALT 21  Recent Lipid Panel    Component Value Date/Time   CHOL 112 05/27/2023 0815   TRIG 246 (H) 05/27/2023 0815   HDL 31 (L) 05/27/2023 0815   CHOLHDL 3.6 05/27/2023 0815   CHOLHDL 2.7 02/18/2020 1037   VLDL 20 02/18/2020 1037   LDLCALC 42 05/27/2023 0815    Home Medications   No outpatient medications have been marked as taking for the 07/04/23 encounter (Appointment) with Lucien Orren SAILOR, PA-C.     Review of Systems      All other systems reviewed and are otherwise negative except as noted above.  Physical Exam    VS:  There were no vitals taken for this visit. , BMI There is no height or weight on file to  calculate BMI.  Wt Readings from Last 3 Encounters:  05/24/23 179 lb (81.2 kg)  12/04/22 187 lb 3.2 oz (84.9 kg)  10/12/22 183 lb (83 kg)     GEN: Well nourished, well developed, in no acute distress. HEENT: normal. Neck: Supple, no JVD, carotid bruits, or masses. Cardiac: RRR, no murmurs, rubs, or gallops. No clubbing, cyanosis, edema.  Radials/PT 2+ and equal bilaterally.  Respiratory:  Respirations regular and unlabored, clear to auscultation bilaterally. GI: Soft, nontender, nondistended. MS: No deformity or atrophy. Skin: Warm and dry, no rash. Neuro:  Strength and sensation are intact. Psych: Normal affect.  Assessment & Plan    CAD -n0 chest pain/pressure -has continued DOE, recommend pulm workup with PCP -patient has remote hx of CABG and stenting -Continue aspirin  81 mg daily, Lipitor  80 mg daily, Jardiance  10 mg daily, Zetia  10 mg daily, fenofibrate  (increase to 145 mg daily), Lasix  20 mg daily, Imdur  60 mg daily, lisinopril  5 mg daily, Lopressor  25 mg twice a day  HTN -Low normal today -Continue to monitor at home and if remains low will discontinue her lisinopril  -continue current medications for now  HLD -Continue Lipitor  80 mg daily, Zetia  10 mg daily, and fenofibrate  145 mg daily -Most recent labs a month ago showed LDL 43, triglycerides 192  -repeat lipid panel   Chronic diastolic CHF -euvolemic on exam -continue current medications  No BP recorded.  {Refresh Note OR Click here to enter BP  :1}***    Disposition: Follow up 6 month following testing with Wilbert Bihari, MD or APP.  Signed, Orren SAILOR Lucien, PA-C 07/02/2023, 11:49 AM [facility]

## 2023-07-04 ENCOUNTER — Ambulatory Visit: Payer: Medicare Other | Attending: Physician Assistant | Admitting: Physician Assistant

## 2023-07-04 DIAGNOSIS — I5032 Chronic diastolic (congestive) heart failure: Secondary | ICD-10-CM

## 2023-07-04 DIAGNOSIS — I2510 Atherosclerotic heart disease of native coronary artery without angina pectoris: Secondary | ICD-10-CM

## 2023-07-04 DIAGNOSIS — I10 Essential (primary) hypertension: Secondary | ICD-10-CM

## 2023-07-04 DIAGNOSIS — N179 Acute kidney failure, unspecified: Secondary | ICD-10-CM | POA: Diagnosis not present

## 2023-07-04 DIAGNOSIS — E785 Hyperlipidemia, unspecified: Secondary | ICD-10-CM

## 2023-07-09 DIAGNOSIS — N1831 Chronic kidney disease, stage 3a: Secondary | ICD-10-CM | POA: Diagnosis not present

## 2023-07-09 DIAGNOSIS — G459 Transient cerebral ischemic attack, unspecified: Secondary | ICD-10-CM | POA: Diagnosis not present

## 2023-07-09 DIAGNOSIS — E0500 Thyrotoxicosis with diffuse goiter without thyrotoxic crisis or storm: Secondary | ICD-10-CM | POA: Diagnosis not present

## 2023-07-09 DIAGNOSIS — E1129 Type 2 diabetes mellitus with other diabetic kidney complication: Secondary | ICD-10-CM | POA: Diagnosis not present

## 2023-07-09 DIAGNOSIS — E1122 Type 2 diabetes mellitus with diabetic chronic kidney disease: Secondary | ICD-10-CM | POA: Diagnosis not present

## 2023-07-09 DIAGNOSIS — N179 Acute kidney failure, unspecified: Secondary | ICD-10-CM | POA: Diagnosis not present

## 2023-07-09 DIAGNOSIS — I129 Hypertensive chronic kidney disease with stage 1 through stage 4 chronic kidney disease, or unspecified chronic kidney disease: Secondary | ICD-10-CM | POA: Diagnosis not present

## 2023-07-29 ENCOUNTER — Other Ambulatory Visit: Payer: Self-pay | Admitting: Physician Assistant

## 2023-07-30 NOTE — Telephone Encounter (Signed)
Prior Authorization for [facility] NIGHT sent to [facility] via web portal. Tracking Number .Decision ID #: [id number] Authorization/Notification is not required for the requested service(s).

## 2023-08-05 ENCOUNTER — Other Ambulatory Visit: Payer: Self-pay | Admitting: Physician Assistant

## 2023-08-05 ENCOUNTER — Other Ambulatory Visit: Payer: Self-pay | Admitting: Cardiology

## 2023-08-19 DIAGNOSIS — R0989 Other specified symptoms and signs involving the circulatory and respiratory systems: Secondary | ICD-10-CM | POA: Diagnosis not present

## 2023-08-21 ENCOUNTER — Other Ambulatory Visit: Payer: Self-pay | Admitting: Pharmacist

## 2023-08-21 DIAGNOSIS — E785 Hyperlipidemia, unspecified: Secondary | ICD-10-CM

## 2023-08-21 DIAGNOSIS — I2510 Atherosclerotic heart disease of native coronary artery without angina pectoris: Secondary | ICD-10-CM

## 2023-08-21 NOTE — Telephone Encounter (Signed)
 Call to remind about f/u lipid lab. Patient states she will be going on Monday Feb 25,2025.

## 2023-08-26 DIAGNOSIS — E785 Hyperlipidemia, unspecified: Secondary | ICD-10-CM | POA: Diagnosis not present

## 2023-08-26 DIAGNOSIS — I2510 Atherosclerotic heart disease of native coronary artery without angina pectoris: Secondary | ICD-10-CM | POA: Diagnosis not present

## 2023-08-26 LAB — LIPID PANEL
Chol/HDL Ratio: 3.6 ratio (ref 0.0–4.4)
Cholesterol, Total: 123 mg/dL (ref 100–199)
HDL: 34 mg/dL — ABNORMAL LOW (ref >39)
LDL Chol Calc (NIH): 52 mg/dL (ref 0–99)
Triglycerides: 230 mg/dL — ABNORMAL HIGH (ref 0–149)
VLDL Cholesterol Cal: 37 mg/dL (ref 5–40)

## 2023-08-26 LAB — HEPATIC FUNCTION PANEL
ALT: 21 IU/L (ref 0–32)
AST: 24 IU/L (ref 0–40)
Albumin: 4.3 g/dL (ref 3.8–4.8)
Alkaline Phosphatase: 47 IU/L (ref 44–121)
Bilirubin Total: 0.3 mg/dL (ref 0.0–1.2)
Bilirubin, Direct: 0.13 mg/dL (ref 0.00–0.40)
Total Protein: 6.2 g/dL (ref 6.0–8.5)

## 2023-09-02 DIAGNOSIS — G4733 Obstructive sleep apnea (adult) (pediatric): Secondary | ICD-10-CM | POA: Diagnosis not present

## 2023-09-02 DIAGNOSIS — I5022 Chronic systolic (congestive) heart failure: Secondary | ICD-10-CM | POA: Diagnosis not present

## 2023-09-02 DIAGNOSIS — I10 Essential (primary) hypertension: Secondary | ICD-10-CM | POA: Diagnosis not present

## 2023-09-02 DIAGNOSIS — I2510 Atherosclerotic heart disease of native coronary artery without angina pectoris: Secondary | ICD-10-CM | POA: Diagnosis not present

## 2023-09-02 DIAGNOSIS — E7800 Pure hypercholesterolemia, unspecified: Secondary | ICD-10-CM | POA: Diagnosis not present

## 2023-09-02 DIAGNOSIS — Z0000 Encounter for general adult medical examination without abnormal findings: Secondary | ICD-10-CM | POA: Diagnosis not present

## 2023-09-02 DIAGNOSIS — Z23 Encounter for immunization: Secondary | ICD-10-CM | POA: Diagnosis not present

## 2023-09-02 DIAGNOSIS — I5032 Chronic diastolic (congestive) heart failure: Secondary | ICD-10-CM | POA: Diagnosis not present

## 2023-09-02 DIAGNOSIS — E1122 Type 2 diabetes mellitus with diabetic chronic kidney disease: Secondary | ICD-10-CM | POA: Diagnosis not present

## 2023-09-02 DIAGNOSIS — K219 Gastro-esophageal reflux disease without esophagitis: Secondary | ICD-10-CM | POA: Diagnosis not present

## 2023-09-18 ENCOUNTER — Other Ambulatory Visit: Payer: Self-pay | Admitting: Orthopedic Surgery

## 2023-09-18 DIAGNOSIS — M533 Sacrococcygeal disorders, not elsewhere classified: Secondary | ICD-10-CM | POA: Diagnosis not present

## 2023-09-18 DIAGNOSIS — M545 Low back pain, unspecified: Secondary | ICD-10-CM | POA: Diagnosis not present

## 2023-09-19 ENCOUNTER — Ambulatory Visit (HOSPITAL_BASED_OUTPATIENT_CLINIC_OR_DEPARTMENT_OTHER): Payer: Medicare Other | Attending: Cardiology | Admitting: Cardiology

## 2023-09-19 VITALS — Ht 59.5 in | Wt 173.0 lb

## 2023-09-19 DIAGNOSIS — I5032 Chronic diastolic (congestive) heart failure: Secondary | ICD-10-CM | POA: Insufficient documentation

## 2023-09-19 DIAGNOSIS — I2510 Atherosclerotic heart disease of native coronary artery without angina pectoris: Secondary | ICD-10-CM | POA: Diagnosis not present

## 2023-09-19 DIAGNOSIS — I110 Hypertensive heart disease with heart failure: Secondary | ICD-10-CM | POA: Insufficient documentation

## 2023-09-19 DIAGNOSIS — G4733 Obstructive sleep apnea (adult) (pediatric): Secondary | ICD-10-CM | POA: Diagnosis not present

## 2023-09-30 ENCOUNTER — Other Ambulatory Visit: Payer: Self-pay | Admitting: *Deleted

## 2023-09-30 DIAGNOSIS — E785 Hyperlipidemia, unspecified: Secondary | ICD-10-CM

## 2023-10-01 DIAGNOSIS — M545 Low back pain, unspecified: Secondary | ICD-10-CM | POA: Diagnosis not present

## 2023-10-01 DIAGNOSIS — M533 Sacrococcygeal disorders, not elsewhere classified: Secondary | ICD-10-CM | POA: Diagnosis not present

## 2023-10-01 NOTE — Procedures (Signed)
    [facility] [address] [place], [place] [address] Tel: [phone]   Fax: [phone]  Titration Interpretation  Patient Name:  [name], [name] Date:  09/19/2023 Referring Physician:  Quintella Reichert Md  Indications for Polysomnography The patient is a 73 year-old Female who is 4\' 11"  and weighs 173.0 lbs. Her BMI equals 34.4.  A full night titration treatment study was performed.  Medication  Amaryl  Lopressor  Metformin  Ranexa  Vascep A   Lipitor   Polysomnogram Data A full night polysomnogram recorded the standard physiologic parameters including EEG, EOG, EMG, EKG, nasal and oral airflow.  Respiratory parameters of chest and abdominal movements were recorded with Respiratory Inductance Plethysmography belts.  Oxygen saturation was recorded by pulse oximetry.   Sleep Architecture The total recording time of the polysomnogram was 420.3 minutes.  The total sleep time was 399.0 minutes.  The patient spent 1.0% of total sleep time in Stage N1, 48.7% in Stage N2, 42.7% in Stages N3, and 7.5% in REM.  Sleep latency was 8.9 minutes.  REM latency was 45.5 minutes.  Sleep Efficiency was 94.9%.  Wake after Sleep Onset time was 12.0 minutes.  Titration Summary The patient was titrated at pressures ranging from 6 cm/H20 with supplemental oxygen at up to 19/15/16 cmH2O.  The last pressure used in the study was 19/15/16cm H2O.  Respiratory Events The polysomnogram revealed a presence of 14 obstructive, 67 central, and 0 mixed apneas resulting in an Apnea index of 12.2 events per hour.  There were 173 hypopneas (>=3% desaturation and/or arousal) resulting in an Apnea\Hypopnea Index (AHI >=3% desaturation and/or arousal) of 38.2 events per hour.  There were 103 hypopneas (>=4% desaturation) resulting in an Apnea\Hypopnea Index (AHI >=4% desaturation) of 27.7 events per hour.  There were 12 Respiratory Effort Related Arousals resulting in a RERA  index of 1.8 events per hour. The Respiratory Disturbance Index is 40.0 events per hour.  The snore index was 67.7 events per hour.  Mean oxygen saturation was 94.3%.  The lowest oxygen saturation during sleep was 84.0%.  Time spent <=88% oxygen saturation was 2.9 minutes (0.7%).  Limb Activity There were 0 limb movements recorded.    Cardiac Summary The average pulse rate was 74.6 bpm.  The minimum pulse rate was 65.0 bpm while the maximum pulse rate was 91.0 bpm.  Cardiac rhythm was normal.  Diagnosis:  Moderate Obstructive Sleep Apnea   Recommendations: Recommend a trial of ResMed BiPAP at 19/15cm H2O with heated humidity and small Airfit F10 Resmed mask. The patient should be counseled in good sleep hygiene and avoid sleeping in the supine position. The patient should be encouraged to avoid driving when sleepy. Followup in Sleep Medicine Clinic in 6 weeks.   This study was personally reviewed and electronically signed by: Armanda Magic, Md Accredited Board Certified in Sleep Medicine Date/Time: 10/01/2023 4:16PM

## 2023-10-03 ENCOUNTER — Ambulatory Visit
Admission: RE | Admit: 2023-10-03 | Discharge: 2023-10-03 | Disposition: A | Discharge status: Home / Self Care | Source: Ambulatory Visit | Attending: Orthopedic Surgery | Admitting: Orthopedic Surgery

## 2023-10-03 ENCOUNTER — Telehealth: Payer: Self-pay

## 2023-10-03 DIAGNOSIS — M533 Sacrococcygeal disorders, not elsewhere classified: Secondary | ICD-10-CM | POA: Diagnosis not present

## 2023-10-03 MED ORDER — IOPAMIDOL (ISOVUE-M 200) INJECTION 41%
1.0000 mL | Freq: Once | INTRAMUSCULAR | Status: AC
  Administered 2023-10-03: 1 mL via INTRA_ARTICULAR

## 2023-10-03 MED ORDER — METHYLPREDNISOLONE ACETATE 40 MG/ML INJ SUSP (RADIOLOG
80.0000 mg | Freq: Once | INTRAMUSCULAR | Status: AC
  Administered 2023-10-03: 80 mg via INTRA_ARTICULAR

## 2023-10-03 NOTE — Telephone Encounter (Signed)
-----   Message from Armanda Magic sent at 10/01/2023  4:17 PM EDT ----- Please let patient know that they have sleep apnea with successful PAP titration.  PAP ordered placed in Epic.  Followup in 6 weeks after starting PAP therapy

## 2023-10-03 NOTE — Telephone Encounter (Signed)
 Called patient to give sleep study results and recommendations. No answer, VM not set up.

## 2023-10-10 DIAGNOSIS — M545 Low back pain, unspecified: Secondary | ICD-10-CM | POA: Diagnosis not present

## 2023-10-10 DIAGNOSIS — M533 Sacrococcygeal disorders, not elsewhere classified: Secondary | ICD-10-CM | POA: Diagnosis not present

## 2023-10-14 DIAGNOSIS — M533 Sacrococcygeal disorders, not elsewhere classified: Secondary | ICD-10-CM | POA: Diagnosis not present

## 2023-10-14 DIAGNOSIS — M545 Low back pain, unspecified: Secondary | ICD-10-CM | POA: Diagnosis not present

## 2023-10-22 DIAGNOSIS — M545 Low back pain, unspecified: Secondary | ICD-10-CM | POA: Diagnosis not present

## 2023-10-29 DIAGNOSIS — M533 Sacrococcygeal disorders, not elsewhere classified: Secondary | ICD-10-CM | POA: Diagnosis not present

## 2023-10-29 DIAGNOSIS — M545 Low back pain, unspecified: Secondary | ICD-10-CM | POA: Diagnosis not present

## 2023-10-29 DIAGNOSIS — M5459 Other low back pain: Secondary | ICD-10-CM | POA: Diagnosis not present

## 2023-11-05 DIAGNOSIS — M533 Sacrococcygeal disorders, not elsewhere classified: Secondary | ICD-10-CM | POA: Diagnosis not present

## 2023-11-07 ENCOUNTER — Telehealth: Payer: Self-pay

## 2023-11-07 NOTE — Telephone Encounter (Signed)
 Patient notified of sleep Titration results and recommendations. All questions (if any) were answered and patient verbalized understanding. CPAP order sent to [facility].

## 2023-11-07 NOTE — Telephone Encounter (Signed)
-----   Message from Armanda Magic sent at 10/01/2023  4:17 PM EDT ----- Please let patient know that they have sleep apnea with successful PAP titration.  PAP ordered placed in Epic.  Followup in 6 weeks after starting PAP therapy

## 2023-11-12 ENCOUNTER — Other Ambulatory Visit: Payer: Self-pay | Admitting: Family Medicine

## 2023-11-12 DIAGNOSIS — Z1231 Encounter for screening mammogram for malignant neoplasm of breast: Secondary | ICD-10-CM

## 2023-11-14 NOTE — Telephone Encounter (Signed)
 [facility]ON WITH [facility] CHANGED TO [facility]  Upon patient request DME selection is [facility] Patient understands he will be contacted by [facility] to set up his cpap. Patient understands to call if [facility] does not contact him with new setup in a timely manner. Patient understands they will be called once confirmation has been received from [facility] that they have received their new machine to schedule 10 week follow up appointment.   [facility] notified of new cpap order  Please add to airview Patient was grateful for the call and thanked me

## 2023-11-16 DIAGNOSIS — M5459 Other low back pain: Secondary | ICD-10-CM | POA: Diagnosis not present

## 2023-11-22 DIAGNOSIS — M48062 Spinal stenosis, lumbar region with neurogenic claudication: Secondary | ICD-10-CM | POA: Diagnosis not present

## 2023-11-26 ENCOUNTER — Ambulatory Visit: Attending: Cardiology | Admitting: Cardiology

## 2023-11-26 VITALS — BP 112/56 | HR 71 | Ht 60.0 in | Wt 169.0 lb

## 2023-11-26 DIAGNOSIS — I5032 Chronic diastolic (congestive) heart failure: Secondary | ICD-10-CM | POA: Diagnosis not present

## 2023-11-26 DIAGNOSIS — E785 Hyperlipidemia, unspecified: Secondary | ICD-10-CM

## 2023-11-26 DIAGNOSIS — I10 Essential (primary) hypertension: Secondary | ICD-10-CM

## 2023-11-26 DIAGNOSIS — G4733 Obstructive sleep apnea (adult) (pediatric): Secondary | ICD-10-CM | POA: Diagnosis not present

## 2023-11-26 DIAGNOSIS — I2510 Atherosclerotic heart disease of native coronary artery without angina pectoris: Secondary | ICD-10-CM | POA: Diagnosis not present

## 2023-11-26 NOTE — Progress Notes (Signed)
 Cardiology Office Note:    Date:  11/26/2023   ID:  [name], DOB 1951/01/25, MRN [id number]  PCP:  Faustina Hood, MD  Cardiologist:  Gaylyn Keas, MD    Referring MD: Faustina Hood, MD   Chief Complaint  Patient presents with   Coronary Artery Disease   Hypertension   Hyperlipidemia   Sleep Apnea   Congestive Heart Failure    History of Present Illness:    [name] is a 73 y.o. female with a hx of  CAD (LAD stent 2007, CABG in 07/2006, Endeavor stent to diagonal 12/2006, cath 2010 with small vessel dz, stable cath 07/2013, s/p DES to the LIMA-LAD in 04/2016, LHC 12/31/16 due to CP showed patent LIMA-LAD, known occlusion of SVG-diagonal and normal LVF - CP felt due to GERD and was placed back on PPI),  HTN,  chronic LE (at end of the day), chronic CHF.  She has chronic CP felt related to microvascular angina.  She is not on Plavix  due to financial constraints.    She was seen by Lovette Rud, PA June 2024 and was complaining of sharp chest pain which would then turn to pressure in the center of her chest but was not exertional but was associate with some diaphoresis and shortness of breath.  She also complained of shortness of breath when shopping.  She underwent a stress PET CT which showed no ischemia.    At last office visit she stated she was not using her CPAP because her husband had misplaced it.  In order to get her a new CPAP we had her undergo a CPAP titration.  She underwent PAP ration and was titrated to BiPAP of 19 over 15 cm H2O.  Since her titration she has not heard from her DME about her new device.  She is here today for followup and is doing well.  SHe denies any chest pain or pressure, SOB, DOE, PND, orthopnea, LE edema, dizziness, palpitations or syncope. She is compliant with her meds and is tolerating meds with no SE.    Past Medical History:  Diagnosis Date   Anemia    [facility] I've had transfusion for low iron (04/17/2016)    Anxiety    Arthritis    "left hip, fingers"  (04/17/2016)   At risk for medication noncompliance    Atypical chest pain    a. atypical symptoms since prior cath.   CAD (coronary artery disease)    a. s/p 2007  LAD stent. b. s/p 07/2006 CABG with LIMA-LAD & SVG to Diag. c. 12/2006 diagonal branch w/ 2.5 x 12 mm Endeavor stent. d. s/p 2010 cath with small vessel disease treated with RX, stable by repeat 2015. e. CP/abnl nuc -> LHC 04/17/16: new ostial stenosis of LIMA-LAD s/p DES, EF 45%, chronically occ SVG-D2. f. Stable cath 12/2016.   Chronic combined systolic and diastolic CHF (congestive heart failure) (HCC)    CKD (chronic kidney disease), stage II    Depression    GERD (gastroesophageal reflux disease)    Headache(784.0)    "maybe once/month" (04/17/2016)   Hyperlipidemia    Hypertension    Migraines    "couple times/yr"  ((04/17/2016)   Myocardial infarction [facility]) ?2008   Obesity    OSA on CPAP    "suppose to wear mask; can't find it" (10/17//2017)   Osteopenia 12/2010 &01/21/2013   Minimal of hip DEXA    Pneumonia    "several years" (  04/17/2016)   Retinopathy 12/08   right   RLS (restless legs syndrome)    Thyroid  disease    "blood work didn't show it but my dr said he can tell by looking at me; I had radiation" (04/17/2016)   Type II diabetes mellitus (HCC)     Past Surgical History:  Procedure Laterality Date   CARDIAC CATHETERIZATION  02/03/2008   for CP patent LIMA w mid-vessel spasm.   CARDIAC CATHETERIZATION N/A 04/17/2016   Procedure: Left Heart Cath and Cors/Grafts Angiography;  Surgeon: Peter M Swaziland, MD;  Location: [facility] INVASIVE CV LAB;  Service: Cardiovascular;  Laterality: N/A;   CARDIAC CATHETERIZATION N/A 04/17/2016   Procedure: Coronary Stent Intervention;  Surgeon: Peter M Swaziland, MD;  Location: [facility] INVASIVE CV LAB;  Service: Cardiovascular;  Laterality: N/A;   COLONOSCOPY  10/2010   rectal polyp   CORONARY ANGIOPLASTY WITH STENT PLACEMENT     "I've had quite  a few stents"    CORONARY ANGIOPLASTY WITH STENT PLACEMENT  04/17/2016   CORONARY ARTERY BYPASS GRAFT  ?2008   s/p two vessel CABG off pump LAD diagonal and PTCA/DE Stent mid LAD/ DE stent LAD diagonal through previous stent sstruts,occluded SVG-diagonal   LAPAROSCOPIC CHOLECYSTECTOMY  1990's   LEFT HEART CATH AND CORS/GRAFTS ANGIOGRAPHY N/A 12/31/2016   Procedure: Left Heart Cath and Cors/Grafts Angiography;  Surgeon: Swaziland, Peter M, MD;  Location: [facility] INVASIVE CV LAB;  Service: Cardiovascular;  Laterality: N/A;   LEFT HEART CATHETERIZATION WITH CORONARY ANGIOGRAM N/A 07/15/2013   Procedure: LEFT HEART CATHETERIZATION WITH CORONARY ANGIOGRAM;  Surgeon: Peter M Swaziland, MD;  Location: [facility] CATH LAB;  Service: Cardiovascular;  Laterality: N/A;   TUBAL LIGATION  1976    Current Medications: Current Meds  Medication Sig   acetaminophen  (TYLENOL ) 500 MG tablet Take 2 tablets (1,000 mg total) by mouth every 6 (six) hours as needed for moderate pain or headache.   aspirin  EC 81 MG tablet Take 1 tablet (81 mg total) by mouth daily.   atorvastatin  (LIPITOR ) 80 MG tablet Take 80 mg by mouth daily.   calcium  carbonate (SUPER CALCIUM ) 1500 (600 Ca) MG TABS tablet 1 tablet with meals Orally once a day   cyanocobalamin (VITAMIN B12) 1000 MCG tablet Take 1,000 mcg by mouth daily.   Empagliflozin-linaGLIPtin (GLYXAMBI) 25-5 MG TABS Take by mouth daily.   ezetimibe  (ZETIA ) 10 MG tablet TAKE 1 TABLET BY MOUTH DAILY   fenofibrate  (TRICOR ) 145 MG tablet TAKE 1 TABLET BY MOUTH DAILY   furosemide  (LASIX ) 20 MG tablet Take 20 mg by mouth daily.   glimepiride (AMARYL) 4 MG tablet Take 4 mg by mouth 2 (two) times daily.   icosapent  Ethyl (VASCEPA ) 1 g capsule Take 2 capsules (2 g total) by mouth 2 (two) times daily.   isosorbide  mononitrate (IMDUR ) 60 MG 24 hr tablet Take 1 tablet (60 mg total) by mouth daily.   metFORMIN  (GLUCOPHAGE ) 500 MG tablet Take 1 tablet by mouth 2 (two) times daily with a meal.   metoprolol   tartrate (LOPRESSOR ) 25 MG tablet Take 1 tablet (25 mg total) by mouth 2 (two) times daily.   nitroGLYCERIN  (NITROSTAT ) 0.4 MG SL tablet DISSOLVE 1 TABLET UNDER THE  TONGUE EVERY 5 MINUTES AS NEEDED FOR CHEST PAIN. MAX OF 3 TABLETS IN 15 MINUTES. CALL 911 IF PAIN  PERSISTS.   ranolazine  (RANEXA ) 500 MG 12 hr tablet Take 1 tablet (500 mg total) by mouth 2 (two) times daily.   VITAMIN D PO Take 50  mcg by mouth daily at 6 (six) AM.     Allergies:   Patient has no known allergies.   Social History   Socioeconomic History   Marital status: Married    Spouse name: Not on file   Number of children: Not on file   Years of education: Not on file   Highest education level: Not on file  Occupational History   Not on file  Tobacco Use   Smoking status: Former    Current packs/day: 0.00    Types: Cigarettes    Start date: 07/02/1964    Quit date: 07/02/1989    Years since quitting: 34.4   Smokeless tobacco: Never   Tobacco comments:    "smoked 2-3 cigarettes/week"  Substance and Sexual Activity   Alcohol use: No   Drug use: No   Sexual activity: Yes  Other Topics Concern   Not on file  Social History Narrative   Not on file   Social Drivers of Health   Financial Resource Strain: Not on file  Food Insecurity: Not on file  Transportation Needs: Not on file  Physical Activity: Not on file  Stress: Not on file  Social Connections: Not on file     Family History: The patient's family history includes Alzheimer's disease in her maternal grandmother; Cancer in her father and mother; Diabetes in her maternal grandmother and mother; Hypertension in her mother; Sleep apnea in her daughter and son.  ROS:   Please see the history of present illness.    ROS  All other systems reviewed and negative.   EKGs/Labs/Other Studies Reviewed:    The following studies were reviewed today: BiPAP titration   EKG Interpretation Date/Time:  Tuesday Nov 26 2023 08:28:39 EDT Ventricular Rate:  71 PR  Interval:  160 QRS Duration:  80 QT Interval:  384 QTC Calculation: 417 R Axis:   65  Text Interpretation: Normal sinus rhythm Nonspecific ST abnormality When compared with ECG of 17-Feb-2020 11:35, PREVIOUS ECG IS PRESENT Confirmed by Gaylyn Keas ([id number]) on 11/26/2023 8:51:23 AM    Recent Labs: 08/26/2023: ALT 21   Recent Lipid Panel    Component Value Date/Time   CHOL 123 08/26/2023 0750   TRIG 230 (H) 08/26/2023 0750   HDL 34 (L) 08/26/2023 0750   CHOLHDL 3.6 08/26/2023 0750   CHOLHDL 2.7 02/18/2020 1037   VLDL 20 02/18/2020 1037   LDLCALC 52 08/26/2023 0750    Physical Exam:    VS:  BP (!) 112/56   Pulse 71   Ht 5' (1.524 m)   Wt 169 lb (76.7 kg)   SpO2 97%   BMI 33.01 kg/m     Wt Readings from Last 3 Encounters:  11/26/23 169 lb (76.7 kg)  09/19/23 173 lb (78.5 kg)  05/24/23 179 lb (81.2 kg)     GEN: Well nourished, well developed in no acute distress HEENT: Normal NECK: No JVD; No carotid bruits LYMPHATICS: No lymphadenopathy CARDIAC:RRR, no murmurs, rubs, gallops RESPIRATORY:  Clear to auscultation without rales, wheezing or rhonchi  ABDOMEN: Soft, non-tender, non-distended MUSCULOSKELETAL:  No edema; No deformity  SKIN: Warm and dry NEUROLOGIC:  Alert and oriented x 3 PSYCHIATRIC:  Normal affect  ASSESSMENT:    1. Coronary artery disease involving native coronary artery of native heart without angina pectoris   2. Chronic diastolic CHF (congestive heart failure) (HCC)   3. Essential hypertension   4. Hyperlipidemia LDL goal <70   5. OSA (obstructive sleep apnea)  PLAN:    In order of problems listed above:  1. CAD s/p CABG  -History of CABG x2 (LIMA-LAD and SVG-Diag) in 2008 with subsequent stenting.  -Most recent cath in 12/2016 showed patent LIMA-LAD graft with known occlusion of SVG-Diag. -Stress PET CT 2024 showed no ischemia -She has not had any further anginal symptoms - Continue atorvastatin  80 mg daily, Imdur  60 mg daily, Lopressor   25 mg twice daily, Ranexa  500 mg twice daily with as needed refills   2. Chronic Diastolic CHF/resolved ischemic cardiomyopathy - EF-40 to 45% but normalized with LVEF of 60-65% on most recent Echo from 12/2018. - She appears euvolemic on exam today - Continue Lasix  20 mg daily with as needed refills - Continue sodium/fluid restriction.   3. Hypertension - BP controlled on exam -Continue Imdur  60 mg daily, Lopressor  25 mg twice daily with as needed refills all   4. Hyperlipidemia -LDL goal less than 70 -I have personally reviewed and interpreted outside labs performed by patient's PCP which showed LDL 52, HDL 41, ALT 20 on 02/02/6961 -Continue atorvastatin  80 mg daily, Zetia  10 mg daily, fenofibrate  145 mg daily, Vascepa  2 g twice daily with as needed refills  5. OSA  -The patient had a repeat BiPAP titration and BIPAP was ordered but she has not received it. -will check with DME  6.  Preoperative cardiovascular examination Her perioperative risk of a major cardiac event is 6.6% according to the Revised Cardiac Risk Index (RCRI).  Therefore, the patient is at moderate risk for perioperative complications.   The patient's  functional capacity is Fair at 4.64 METs according to the Duke Activity Status Index (DASI). Recommendations: According to ACC/AHA guidelines, no further cardiovascular testing needed.  The patient may proceed to surgery at acceptable risk.   -OK to hold ASA for surgery  Medication Adjustments/Labs and Tests Ordered: Current medicines are reviewed at length with the patient today.  Concerns regarding medicines are outlined above.  Orders Placed This Encounter  Procedures   EKG 12-Lead   No orders of the defined types were placed in this encounter.   Signed, Gaylyn Keas, MD  11/26/2023 8:48 AM    [facility]

## 2023-11-26 NOTE — Patient Instructions (Signed)
 Medication Instructions:  Your physician recommends that you continue on your current medications as directed. Please refer to the Current Medication list given to you today.  *If you need a refill on your cardiac medications before your next appointment, please call your pharmacy*  Lab Work: None.  If you have labs (blood work) drawn today and your tests are completely normal, you will receive your results only by: MyChart Message (if you have MyChart) OR A paper copy in the mail If you have any lab test that is abnormal or we need to change your treatment, we will call you to review the results.  Testing/Procedures: None.  Follow-Up: At [facility], you and your health needs are our priority.  As part of our continuing mission to provide you with exceptional heart care, our providers are all part of one team.  This team includes your primary Cardiologist (physician) and Advanced Practice Providers or APPs (Physician Assistants and Nurse Practitioners) who all work together to provide you with the care you need, when you need it.  Your next appointment will be dependent upon delivery of your bipap machine and it will be with:    Provider:   Gaylyn Keas, MD    Other Instructions Please call our office if you have not received any details regarding delivery of your bipap in 2 weeks.

## 2023-11-26 NOTE — Telephone Encounter (Signed)
 Reached out to the patient and informed her that [facility] has been trying to reached her to set up her cpap machine. Gave her the number to reach them to get setup.

## 2023-11-28 ENCOUNTER — Telehealth: Payer: Self-pay | Admitting: *Deleted

## 2023-11-28 ENCOUNTER — Ambulatory Visit
Admission: RE | Admit: 2023-11-28 | Discharge: 2023-11-28 | Disposition: A | Discharge status: Home / Self Care | Source: Ambulatory Visit | Attending: Family Medicine | Admitting: Family Medicine

## 2023-11-28 DIAGNOSIS — Z1231 Encounter for screening mammogram for malignant neoplasm of breast: Secondary | ICD-10-CM

## 2023-11-28 NOTE — Telephone Encounter (Signed)
 Pt was seen today by Dr. Micael Adas. Her nurse Ardelia Beau, RN brought the clearance form to me as she states did not look like it was in the chart. RN states the paper form was placed into Dr. Charl Concha box for appt today. I assured RN I will follow the protocol for preop and get the request into the chart. I will also send to preop as protocol. I did review Dr. Charl Concha ov notes and she cleared the pt, however, did not note if ok to hold ASA. I stated to RN if Dr. Micael Adas will amend her ov notes to reflect if ok to hold ASA.      Pre-operative Risk Assessment    Patient Name: [name]  DOB: August 13, 1950 MRN: [id number]   Date of last office visit: 11/28/23 DR. TURNER Date of next office visit: NONE   Request for Surgical Clearance    Procedure:  L4-5 DECOMPRESSION  Date of Surgery:  Clearance TBD                                Surgeon:  DR. [facility] BROOKS Surgeon's Group or Practice Name:  [facility] Phone number:  [phone] Fax number:  [phone] ATTN: Amanda Jungling   Type of Clearance Requested:   - Medical  - Pharmacy:  Hold Aspirin      Type of Anesthesia:  Not Indicated   Additional requests/questions:    Princ[place] Broom   11/28/2023, 4:18 PM

## 2023-11-28 NOTE — Progress Notes (Signed)
 Will forward to pre op APP as well.

## 2023-11-29 NOTE — Telephone Encounter (Signed)
   Patient Name: [name]  DOB: 25-Nov-1950 MRN: [id number]  Primary Cardiologist: Gaylyn Keas, MD  Chart reviewed as part of pre-operative protocol coverage.  Patient was seen in clinic on 11/28/2023 by Dr. Micael Adas and was cleared for surgery.  I will route the note from most recent office visit to the requesting party via Epic fax function and remove from pre-op  pool.  Please call with questions.  Jude Norton, NP 11/29/2023, 7:59 AM

## 2023-11-29 NOTE — Progress Notes (Signed)
 Will send to preop APP to review if any additional notes are needed, if not will fax notes to requesting office

## 2023-12-04 ENCOUNTER — Ambulatory Visit: Attending: Cardiology | Admitting: Pharmacist

## 2023-12-04 DIAGNOSIS — E782 Mixed hyperlipidemia: Secondary | ICD-10-CM | POA: Diagnosis not present

## 2023-12-04 NOTE — Patient Instructions (Signed)
 Continue atorvastatin  80mg  daily, ezetimibe  10mg  daily, fenofibrate  145mg  daily and icosapent  ethyl 2g twice a day  Try some chair exercises/chair yoga. Squats to a chair holding onto counter

## 2023-12-04 NOTE — Progress Notes (Signed)
 Patient ID: [name]                 DOB: [birth year]                    MRN: [id number]      HPI: [name] is a 73 y.o. female patient of Dr. Micael [name] referred to lipid clinic by Kelli Rud, PA. PMH is significant for CAD (LAD stent 2007, CABG in 07/2006, Endeavor stent to diagonal 12/2006, cath 2010 with small vessel dz, stable cath 07/2013, s/p DES to the LIMA-LAD in 04/2016, LHC 12/31/16 due to CP showed patent LIMA-LAD, known occlusion of SVG-diagonal and normal LVF - CP felt due to GERD and was placed back on PPI),  HTN, chronic LE (at end of the day), chronic CHF.   Patient presents today to lipid clinic. She reports staying active with her grandkids. Doesn't like to walk outside due to concerns over her balance but states she walks inside the house constantly. Does not know her last A1C, but states her fasting BG is 80-120. She is compliant with her medications.   Does snack sometimes but states she only eats a small amount. Reports it doesn't take much to fill her up.   Current Medications: atorvastatin  80mg  daily, ezetimibe  10mg  daily, fenofibrate  145mg  daily and icosapent  ethyl 2g BID Intolerances:  Risk Factors: progressive ASCVD, DM LDL-C goal: <55 ApoB goal: <70 TG goal: <150  Diet: oatmeal: old fashion w/ some butter (< 1sp) and sugar or scrambled eggs Lunch: most of the time at home Dinner: spaghetti (small portion)- tries not to eat a lot of fried foods Snack: occasional cookie, cheese balls, popcorn, potato chips  Diet [name] and water  Exercise: house work, doesn't like to walk outside bc she is afraid she will get unbalanced  Family History:  Family History  Problem Relation Age of Onset   Hypertension Mother    Cancer Mother        Lung   Diabetes Mother    Cancer Father        Lung   Diabetes Maternal Grandmother    Alzheimer's disease Maternal Grandmother    Sleep apnea Son    Sleep apnea Daughter     Social History: no tobacco, no  ETOH  Labs: Lipid Panel     Component Value Date/Time   CHOL 123 08/26/2023 0750   TRIG 230 (H) 08/26/2023 0750   HDL 34 (L) 08/26/2023 0750   CHOLHDL 3.6 08/26/2023 0750   CHOLHDL 2.7 02/18/2020 1037   VLDL 20 02/18/2020 1037   LDLCALC 52 08/26/2023 0750   LABVLDL 37 08/26/2023 0750    Past Medical History:  Diagnosis Date   Anemia    [name] I've had transfusion for low iron (04/17/2016)   Anxiety    Arthritis    "left hip, fingers"  (04/17/2016)   At risk for medication noncompliance    Atypical chest pain    a. atypical symptoms since prior cath.   CAD (coronary artery disease)    a. s/p 2007  LAD stent. b. s/p 07/2006 CABG with LIMA-LAD & SVG to Diag. c. 12/2006 diagonal branch w/ 2.5 x 12 mm Endeavor stent. d. s/p 2010 cath with small vessel disease treated with RX, stable by repeat 2015. e. CP/abnl nuc -> LHC 04/17/16: new ostial stenosis of LIMA-LAD s/p DES, EF 45%, chronically occ SVG-D2. f. Stable cath 12/2016.   Chronic combined  systolic and diastolic CHF (congestive heart failure) (HCC)    CKD (chronic kidney disease), stage II    Depression    GERD (gastroesophageal reflux disease)    Headache(784.0)    "maybe once/month" (04/17/2016)   Hyperlipidemia    Hypertension    Migraines    "couple times/yr"  ((04/17/2016)   Myocardial infarction [facility]) ?2008   Obesity    OSA on CPAP    "suppose to wear mask; can't find it" (10/17//2017)   Osteopenia 12/2010 &01/21/2013   Minimal of hip DEXA    Pneumonia    "several years" (04/17/2016)   Retinopathy 12/08   right   RLS (restless legs syndrome)    Thyroid  disease    "blood work didn't show it but my dr said he can tell by looking at me; I had radiation" (04/17/2016)   Type II diabetes mellitus (HCC)     Current Outpatient Medications on File Prior to Visit  Medication Sig Dispense Refill   acetaminophen  (TYLENOL ) 500 MG tablet Take 2 tablets (1,000 mg total) by mouth every 6 (six) hours  as needed for moderate pain or headache.     aspirin  EC 81 MG tablet Take 1 tablet (81 mg total) by mouth daily.     atorvastatin  (LIPITOR ) 80 MG tablet Take 80 mg by mouth daily.     calcium  carbonate (SUPER CALCIUM ) 1500 (600 Ca) MG TABS tablet 1 tablet with meals Orally once a day     cyanocobalamin (VITAMIN B12) 1000 MCG tablet Take 1,000 mcg by mouth daily.     Empagliflozin-linaGLIPtin (GLYXAMBI) 25-5 MG TABS Take by mouth daily.     ezetimibe  (ZETIA ) 10 MG tablet TAKE 1 TABLET BY MOUTH DAILY 90 tablet 3   fenofibrate  (TRICOR ) 145 MG tablet TAKE 1 TABLET BY MOUTH DAILY 90 tablet 3   furosemide  (LASIX ) 20 MG tablet Take 20 mg by mouth daily.     glimepiride (AMARYL) 4 MG tablet Take 4 mg by mouth 2 (two) times daily.     icosapent  Ethyl (VASCEPA ) 1 g capsule Take 2 capsules (2 g total) by mouth 2 (two) times daily. 120 capsule 11   metFORMIN  (GLUCOPHAGE ) 500 MG tablet Take 1 tablet by mouth 2 (two) times daily with a meal.     ranolazine  (RANEXA ) 500 MG 12 hr tablet Take 1 tablet (500 mg total) by mouth 2 (two) times daily. 180 tablet 3   isosorbide  mononitrate (IMDUR ) 60 MG 24 hr tablet Take 1 tablet (60 mg total) by mouth daily. 90 tablet 3   Magnesium  250 MG TABS Take 1 tablet by mouth daily at 6 (six) AM. (Patient not taking: Reported on 12/04/2023)     metoprolol  tartrate (LOPRESSOR ) 25 MG tablet Take 1 tablet (25 mg total) by mouth 2 (two) times daily. 180 tablet 3   nitroGLYCERIN  (NITROSTAT ) 0.4 MG SL tablet DISSOLVE 1 TABLET UNDER THE  TONGUE EVERY 5 MINUTES AS NEEDED FOR CHEST PAIN. MAX OF 3 TABLETS IN 15 MINUTES. CALL 911 IF PAIN  PERSISTS. 100 tablet 3   VITAMIN D PO Take 50 mcg by mouth daily at 6 (six) AM.     No current facility-administered medications on file prior to visit.    No Known Allergies  Assessment/Plan:  1. Hyperlipidemia -  Hyperlipidemia Assessment: LDL-C is at goal of <55 TG are elevated Patient already on atorvastatin  80mg  daily, ezetimibe  10mg  daily,  fenofibrate  145mg  daily and icosapent  ethyl 2g BID Reports compliance with medications Reviewed diet Encouraged limited  saturated fat and high fiber- handout given Encouraged increase physical activity- chair yoga  Plan: Continue atorvastatin  80mg  daily, ezetimibe  10mg  daily, fenofibrate  145mg  daily and icosapent  ethyl 2g BID Increase physical activity Decrease saturated fats, increase fiber    Thank you,  Lesle Faron D Nachelle Negrette, Pharm.Monika Annas, CPP [facility] [address] [place], [place] [address]  Phone: [phone]; Fax: [phone]

## 2023-12-04 NOTE — Assessment & Plan Note (Signed)
 Assessment: LDL-C is at goal of <55 TG are elevated Patient already on atorvastatin  80mg  daily, ezetimibe  10mg  daily, fenofibrate  145mg  daily and icosapent  ethyl 2g BID Reports compliance with medications Reviewed diet Encouraged limited saturated fat and high fiber- handout given Encouraged increase physical activity- chair yoga  Plan: Continue atorvastatin  80mg  daily, ezetimibe  10mg  daily, fenofibrate  145mg  daily and icosapent  ethyl 2g BID Increase physical activity Decrease saturated fats, increase fiber

## 2023-12-10 ENCOUNTER — Telehealth: Payer: Self-pay | Admitting: Cardiology

## 2023-12-10 NOTE — Telephone Encounter (Signed)
 Patient called to report as requested that she has not received her Bipap machine as yet.

## 2023-12-17 DIAGNOSIS — E7800 Pure hypercholesterolemia, unspecified: Secondary | ICD-10-CM | POA: Diagnosis not present

## 2023-12-17 DIAGNOSIS — Z23 Encounter for immunization: Secondary | ICD-10-CM | POA: Diagnosis not present

## 2023-12-17 DIAGNOSIS — I2510 Atherosclerotic heart disease of native coronary artery without angina pectoris: Secondary | ICD-10-CM | POA: Diagnosis not present

## 2023-12-17 DIAGNOSIS — I10 Essential (primary) hypertension: Secondary | ICD-10-CM | POA: Diagnosis not present

## 2023-12-17 DIAGNOSIS — M5136 Other intervertebral disc degeneration, lumbar region with discogenic back pain only: Secondary | ICD-10-CM | POA: Diagnosis not present

## 2023-12-17 DIAGNOSIS — I5022 Chronic systolic (congestive) heart failure: Secondary | ICD-10-CM | POA: Diagnosis not present

## 2023-12-17 DIAGNOSIS — G4733 Obstructive sleep apnea (adult) (pediatric): Secondary | ICD-10-CM | POA: Diagnosis not present

## 2023-12-17 DIAGNOSIS — I5032 Chronic diastolic (congestive) heart failure: Secondary | ICD-10-CM | POA: Diagnosis not present

## 2023-12-17 DIAGNOSIS — E1122 Type 2 diabetes mellitus with diabetic chronic kidney disease: Secondary | ICD-10-CM | POA: Diagnosis not present

## 2023-12-30 DIAGNOSIS — I5022 Chronic systolic (congestive) heart failure: Secondary | ICD-10-CM | POA: Diagnosis not present

## 2023-12-30 DIAGNOSIS — N183 Chronic kidney disease, stage 3 unspecified: Secondary | ICD-10-CM | POA: Diagnosis not present

## 2023-12-30 DIAGNOSIS — N1832 Chronic kidney disease, stage 3b: Secondary | ICD-10-CM | POA: Diagnosis not present

## 2023-12-30 DIAGNOSIS — E7800 Pure hypercholesterolemia, unspecified: Secondary | ICD-10-CM | POA: Diagnosis not present

## 2023-12-31 ENCOUNTER — Other Ambulatory Visit: Payer: Self-pay | Admitting: Cardiology

## 2024-01-07 DIAGNOSIS — N1831 Chronic kidney disease, stage 3a: Secondary | ICD-10-CM | POA: Diagnosis not present

## 2024-01-16 DIAGNOSIS — H25813 Combined forms of age-related cataract, bilateral: Secondary | ICD-10-CM | POA: Diagnosis not present

## 2024-01-16 DIAGNOSIS — N1831 Chronic kidney disease, stage 3a: Secondary | ICD-10-CM | POA: Diagnosis not present

## 2024-01-16 DIAGNOSIS — H02831 Dermatochalasis of right upper eyelid: Secondary | ICD-10-CM | POA: Diagnosis not present

## 2024-01-16 DIAGNOSIS — H02834 Dermatochalasis of left upper eyelid: Secondary | ICD-10-CM | POA: Diagnosis not present

## 2024-01-16 DIAGNOSIS — I2510 Atherosclerotic heart disease of native coronary artery without angina pectoris: Secondary | ICD-10-CM | POA: Diagnosis not present

## 2024-01-16 DIAGNOSIS — H40013 Open angle with borderline findings, low risk, bilateral: Secondary | ICD-10-CM | POA: Diagnosis not present

## 2024-01-16 DIAGNOSIS — E119 Type 2 diabetes mellitus without complications: Secondary | ICD-10-CM | POA: Diagnosis not present

## 2024-01-16 DIAGNOSIS — I129 Hypertensive chronic kidney disease with stage 1 through stage 4 chronic kidney disease, or unspecified chronic kidney disease: Secondary | ICD-10-CM | POA: Diagnosis not present

## 2024-01-16 DIAGNOSIS — E1122 Type 2 diabetes mellitus with diabetic chronic kidney disease: Secondary | ICD-10-CM | POA: Diagnosis not present

## 2024-01-23 ENCOUNTER — Other Ambulatory Visit: Payer: Self-pay | Admitting: Cardiology

## 2024-01-30 DIAGNOSIS — N1832 Chronic kidney disease, stage 3b: Secondary | ICD-10-CM | POA: Diagnosis not present

## 2024-01-30 DIAGNOSIS — I5022 Chronic systolic (congestive) heart failure: Secondary | ICD-10-CM | POA: Diagnosis not present

## 2024-01-30 DIAGNOSIS — N183 Chronic kidney disease, stage 3 unspecified: Secondary | ICD-10-CM | POA: Diagnosis not present

## 2024-01-30 DIAGNOSIS — E7800 Pure hypercholesterolemia, unspecified: Secondary | ICD-10-CM | POA: Diagnosis not present

## 2024-03-01 DIAGNOSIS — N183 Chronic kidney disease, stage 3 unspecified: Secondary | ICD-10-CM | POA: Diagnosis not present

## 2024-03-01 DIAGNOSIS — E7800 Pure hypercholesterolemia, unspecified: Secondary | ICD-10-CM | POA: Diagnosis not present

## 2024-03-01 DIAGNOSIS — N1832 Chronic kidney disease, stage 3b: Secondary | ICD-10-CM | POA: Diagnosis not present

## 2024-03-01 DIAGNOSIS — I5022 Chronic systolic (congestive) heart failure: Secondary | ICD-10-CM | POA: Diagnosis not present

## 2024-03-05 DIAGNOSIS — N1832 Chronic kidney disease, stage 3b: Secondary | ICD-10-CM | POA: Diagnosis not present

## 2024-03-05 DIAGNOSIS — I10 Essential (primary) hypertension: Secondary | ICD-10-CM | POA: Diagnosis not present

## 2024-03-05 DIAGNOSIS — Z23 Encounter for immunization: Secondary | ICD-10-CM | POA: Diagnosis not present

## 2024-03-05 DIAGNOSIS — E7800 Pure hypercholesterolemia, unspecified: Secondary | ICD-10-CM | POA: Diagnosis not present

## 2024-03-05 DIAGNOSIS — G4733 Obstructive sleep apnea (adult) (pediatric): Secondary | ICD-10-CM | POA: Diagnosis not present

## 2024-03-05 DIAGNOSIS — I5032 Chronic diastolic (congestive) heart failure: Secondary | ICD-10-CM | POA: Diagnosis not present

## 2024-03-05 DIAGNOSIS — E1122 Type 2 diabetes mellitus with diabetic chronic kidney disease: Secondary | ICD-10-CM | POA: Diagnosis not present

## 2024-03-05 DIAGNOSIS — I2510 Atherosclerotic heart disease of native coronary artery without angina pectoris: Secondary | ICD-10-CM | POA: Diagnosis not present

## 2024-03-31 DIAGNOSIS — N1832 Chronic kidney disease, stage 3b: Secondary | ICD-10-CM | POA: Diagnosis not present

## 2024-03-31 DIAGNOSIS — N183 Chronic kidney disease, stage 3 unspecified: Secondary | ICD-10-CM | POA: Diagnosis not present

## 2024-03-31 DIAGNOSIS — I5022 Chronic systolic (congestive) heart failure: Secondary | ICD-10-CM | POA: Diagnosis not present

## 2024-03-31 DIAGNOSIS — E7800 Pure hypercholesterolemia, unspecified: Secondary | ICD-10-CM | POA: Diagnosis not present

## 2024-04-06 ENCOUNTER — Ambulatory Visit (HOSPITAL_COMMUNITY): Payer: Self-pay | Admitting: Physician Assistant

## 2024-04-07 ENCOUNTER — Ambulatory Visit (HOSPITAL_COMMUNITY): Payer: Self-pay | Admitting: Physician Assistant

## 2024-04-07 DIAGNOSIS — M545 Low back pain, unspecified: Secondary | ICD-10-CM | POA: Diagnosis not present

## 2024-04-09 NOTE — Pre-Procedure Instructions (Signed)
 Surgical Instructions   Your procedure is scheduled on April 13, 2024. Report to [facility] Main Entrance A at 9:00 A.M., then check in with the Admitting office. Any questions or running late day of surgery: call [phone]  Questions prior to your surgery date: call [phone], Monday-Friday, 8am-4pm. If you experience any cold or flu symptoms such as cough, fever, chills, shortness of breath, etc. between now and your scheduled surgery, please notify us  at the above number.     Remember:  Do not eat after midnight the night before your surgery  You may drink clear liquids until 8:00 AM the morning of your surgery.   Clear liquids allowed are: Water, Non-Citrus Juices (without pulp), Carbonated Beverages, Clear Tea (no milk, honey, etc.), Black Coffee Only (NO MILK, CREAM OR POWDERED CREAMER of any kind), and Gatorade.    Take these medicines the morning of surgery with A SIP OF WATER: ezetimibe  (ZETIA )  fenofibrate  (TRICOR )  isosorbide  mononitrate (IMDUR )  metoprolol  tartrate (LOPRESSOR )  ranolazine  (RANEXA )    May take these medicines IF NEEDED: acetaminophen  (TYLENOL )  nitroGLYCERIN  (NITROSTAT ) - if dose taken prior to surgery, please call [phone]   Follow your surgeon's instructions on when to stop Aspirin  and icosapent  Ethyl (VASCEPA ).  If no instructions were given by your surgeon then you will need to call the office to get those instructions.     One week prior to surgery, STOP taking any Aleve, Naproxen, Ibuprofen, Motrin, Advil, Goody's, BC's, all herbal medications, fish oil, and non-prescription vitamins.   WHAT DO I DO ABOUT MY DIABETES MEDICATION?   Do not take glimepiride (AMARYL) the evening before surgery or the morning of surgery.  Do not take metFORMIN  (GLUCOPHAGE ) the morning of surgery.     STOP taking your Empagliflozin-linaGLIPtin [facility]) three days prior to surgery. Your last dose will be October 9th.   HOW TO MANAGE YOUR  DIABETES BEFORE AND AFTER SURGERY  Why is it important to control my blood sugar before and after surgery? Improving blood sugar levels before and after surgery helps healing and can limit problems. A way of improving blood sugar control is eating a healthy diet by:  Eating less sugar and carbohydrates  Increasing activity/exercise  Talking with your doctor about reaching your blood sugar goals High blood sugars (greater than 180 mg/dL) can raise your risk of infections and slow your recovery, so you will need to focus on controlling your diabetes during the weeks before surgery. Make sure that the doctor who takes care of your diabetes knows about your planned surgery including the date and location.  How do I manage my blood sugar before surgery? Check your blood sugar at least 4 times a day, starting 2 days before surgery, to make sure that the level is not too high or low.  Check your blood sugar the morning of your surgery when you wake up and every 2 hours until you get to the Short Stay unit.  If your blood sugar is less than 70 mg/dL, you will need to treat for low blood sugar: Do not take insulin . Treat a low blood sugar (less than 70 mg/dL) with  cup of clear juice (cranberry or apple), 4 glucose tablets, OR glucose gel. Recheck blood sugar in 15 minutes after treatment (to make sure it is greater than 70 mg/dL). If your blood sugar is not greater than 70 mg/dL on recheck, call [phone] for further instructions. Report your blood sugar to the short stay nurse when you get  to Short Stay.  If you are admitted to the hospital after surgery: Your blood sugar will be checked by the staff and you will probably be given insulin  after surgery (instead of oral diabetes medicines) to make sure you have good blood sugar levels. The goal for blood sugar control after surgery is 80-180 mg/dL.                      Do NOT Smoke (Tobacco/Vaping) for 24 hours prior to your procedure.  If  you use a CPAP at night, you may bring your mask/headgear for your overnight stay.   You will be asked to remove any contacts, glasses, piercing's, hearing aid's, dentures/partials prior to surgery. Please bring cases for these items if needed.    Patients discharged the day of surgery will not be allowed to drive home, and someone needs to stay with them for 24 hours.  SURGICAL WAITING ROOM VISITATION Patients may have no more than 2 support people in the waiting area - these visitors may rotate.   Pre-op  nurse will coordinate an appropriate time for 1 ADULT support person, who may not rotate, to accompany patient in pre-op .  Children under the age of 20 must have an adult with them who is not the patient and must remain in the main waiting area with an adult.  If the patient needs to stay at the hospital during part of their recovery, the visitor guidelines for inpatient rooms apply.  Please refer to the [facility] website for the visitor guidelines for any additional information.   If you received a COVID test during your pre-op  visit  it is requested that you wear a mask when out in public, stay away from anyone that may not be feeling well and notify your surgeon if you develop symptoms. If you have been in contact with anyone that has tested positive in the last 10 days please notify you surgeon.      Pre-operative 4 CHG Bathing Instructions   You can play a key role in reducing the risk of infection after surgery. Your skin needs to be as free of germs as possible. You can reduce the number of germs on your skin by washing with CHG (chlorhexidine gluconate) soap before surgery. CHG is an antiseptic soap that kills germs and continues to kill germs even after washing.   DO NOT use if you have an allergy to chlorhexidine/CHG or antibacterial soaps. If your skin becomes reddened or irritated, stop using the CHG and notify one of our RNs at [phone].   Please shower with the CHG  soap starting 4 days before surgery using the following schedule:     Please keep in mind the following:  DO NOT shave, including legs and underarms, starting the day of your first shower.   You may shave your face at any point before/day of surgery.  Place clean sheets on your bed the day you start using CHG soap. Use a clean washcloth (not used since being washed) for each shower. DO NOT sleep with pets once you start using the CHG.   CHG Shower Instructions:  Wash your face and private area with normal soap. If you choose to wash your hair, wash first with your normal shampoo.  After you use shampoo/soap, rinse your hair and body thoroughly to remove shampoo/soap residue.  Turn the water OFF and apply  bottle of CHG soap to a CLEAN washcloth.  Apply CHG soap ONLY FROM YOUR  NECK DOWN TO YOUR TOES (washing for 3-5 minutes)  DO NOT use CHG soap on face, private areas, open wounds, or sores.  Pay special attention to the area where your surgery is being performed.  If you are having back surgery, having someone wash your back for you may be helpful. Wait 2 minutes after CHG soap is applied, then you may rinse off the CHG soap.  Pat dry with a clean towel  Put on clean clothes/pajamas   If you choose to wear lotion, please use ONLY the CHG-compatible lotions that are listed below.  Additional instructions for the day of surgery:  If you choose, you may shower the morning of surgery with an antibacterial soap.  DO NOT APPLY any lotions, deodorants, cologne, or perfumes.   Do not bring valuables to the hospital. [facility] is not responsible for any belongings/valuables. Do not wear nail polish, gel polish, artificial nails, or any other type of covering on natural nails (fingers and toes) Do not wear jewelry or makeup Put on clean/comfortable clothes.  Please brush your teeth.  Ask your nurse before applying any prescription medications to the skin.     CHG Compatible Lotions    Aveeno Moisturizing lotion  Cetaphil Moisturizing Cream  Cetaphil Moisturizing Lotion  Clairol Herbal Essence Moisturizing Lotion, Dry Skin  Clairol Herbal Essence Moisturizing Lotion, Extra Dry Skin  Clairol Herbal Essence Moisturizing Lotion, Normal Skin  Curel Age Defying Therapeutic Moisturizing Lotion with Alpha Hydroxy  Curel Extreme Care Body Lotion  Curel Soothing Hands Moisturizing Hand Lotion  Curel Therapeutic Moisturizing Cream, Fragrance-Free  Curel Therapeutic Moisturizing Lotion, Fragrance-Free  Curel Therapeutic Moisturizing Lotion, Original Formula  Eucerin Daily Replenishing Lotion  Eucerin Dry Skin Therapy Plus Alpha Hydroxy Crme  Eucerin Dry Skin Therapy Plus Alpha Hydroxy Lotion  Eucerin Original Crme  Eucerin Original Lotion  Eucerin Plus Crme Eucerin Plus Lotion  Eucerin TriLipid Replenishing Lotion  Keri Anti-Bacterial Hand Lotion  Keri Deep Conditioning Original Lotion Dry Skin Formula Softly Scented  Keri Deep Conditioning Original Lotion, Fragrance Free Sensitive Skin Formula  Keri Lotion Fast Absorbing Fragrance Free Sensitive Skin Formula  Keri Lotion Fast Absorbing Softly Scented Dry Skin Formula  Keri Original Lotion  Keri Skin Renewal Lotion Keri Silky Smooth Lotion  Keri Silky Smooth Sensitive Skin Lotion  Nivea Body Creamy Conditioning Oil  Nivea Body Extra Enriched Lotion  Nivea Body Original Lotion  Nivea Body Sheer Moisturizing Lotion Nivea Crme  Nivea Skin Firming Lotion  NutraDerm 30 Skin Lotion  NutraDerm Skin Lotion  NutraDerm Therapeutic Skin Cream  NutraDerm Therapeutic Skin Lotion  ProShield Protective Hand Cream  Provon moisturizing lotion  Please read over the following fact sheets that you were given.

## 2024-04-10 ENCOUNTER — Other Ambulatory Visit: Payer: Self-pay

## 2024-04-10 ENCOUNTER — Encounter (HOSPITAL_COMMUNITY): Payer: Self-pay

## 2024-04-10 ENCOUNTER — Encounter (HOSPITAL_COMMUNITY)
Admission: RE | Admit: 2024-04-10 | Discharge: 2024-04-10 | Disposition: A | Discharge status: Home / Self Care | Source: Ambulatory Visit | Attending: Orthopedic Surgery | Admitting: Orthopedic Surgery

## 2024-04-10 VITALS — BP 139/66 | HR 73 | Temp 98.0°F | Resp 17 | Ht 59.0 in | Wt 169.6 lb

## 2024-04-10 DIAGNOSIS — Z87891 Personal history of nicotine dependence: Secondary | ICD-10-CM | POA: Diagnosis not present

## 2024-04-10 DIAGNOSIS — E785 Hyperlipidemia, unspecified: Secondary | ICD-10-CM | POA: Diagnosis not present

## 2024-04-10 DIAGNOSIS — N182 Chronic kidney disease, stage 2 (mild): Secondary | ICD-10-CM | POA: Diagnosis not present

## 2024-04-10 DIAGNOSIS — I2510 Atherosclerotic heart disease of native coronary artery without angina pectoris: Secondary | ICD-10-CM | POA: Insufficient documentation

## 2024-04-10 DIAGNOSIS — M48062 Spinal stenosis, lumbar region with neurogenic claudication: Secondary | ICD-10-CM | POA: Insufficient documentation

## 2024-04-10 DIAGNOSIS — Z7982 Long term (current) use of aspirin: Secondary | ICD-10-CM | POA: Diagnosis not present

## 2024-04-10 DIAGNOSIS — E1122 Type 2 diabetes mellitus with diabetic chronic kidney disease: Secondary | ICD-10-CM | POA: Diagnosis not present

## 2024-04-10 DIAGNOSIS — Z01818 Encounter for other preprocedural examination: Secondary | ICD-10-CM | POA: Diagnosis present

## 2024-04-10 DIAGNOSIS — Z955 Presence of coronary angioplasty implant and graft: Secondary | ICD-10-CM | POA: Diagnosis not present

## 2024-04-10 DIAGNOSIS — Z01812 Encounter for preprocedural laboratory examination: Secondary | ICD-10-CM | POA: Diagnosis not present

## 2024-04-10 DIAGNOSIS — K219 Gastro-esophageal reflux disease without esophagitis: Secondary | ICD-10-CM | POA: Insufficient documentation

## 2024-04-10 DIAGNOSIS — Z79899 Other long term (current) drug therapy: Secondary | ICD-10-CM | POA: Insufficient documentation

## 2024-04-10 DIAGNOSIS — Z7984 Long term (current) use of oral hypoglycemic drugs: Secondary | ICD-10-CM | POA: Diagnosis not present

## 2024-04-10 DIAGNOSIS — Z951 Presence of aortocoronary bypass graft: Secondary | ICD-10-CM | POA: Insufficient documentation

## 2024-04-10 DIAGNOSIS — I5032 Chronic diastolic (congestive) heart failure: Secondary | ICD-10-CM | POA: Insufficient documentation

## 2024-04-10 DIAGNOSIS — I130 Hypertensive heart and chronic kidney disease with heart failure and stage 1 through stage 4 chronic kidney disease, or unspecified chronic kidney disease: Secondary | ICD-10-CM | POA: Insufficient documentation

## 2024-04-10 DIAGNOSIS — G4733 Obstructive sleep apnea (adult) (pediatric): Secondary | ICD-10-CM | POA: Diagnosis not present

## 2024-04-10 HISTORY — DX: Chronic kidney disease, unspecified: N18.9

## 2024-04-10 LAB — GLUCOSE, CAPILLARY
Glucose-Capillary: 42 mg/dL — CL (ref 70–99)
Glucose-Capillary: 58 mg/dL — ABNORMAL LOW (ref 70–99)
Glucose-Capillary: 62 mg/dL — ABNORMAL LOW (ref 70–99)

## 2024-04-10 LAB — BASIC METABOLIC PANEL WITH GFR
Anion gap: 11 (ref 5–15)
BUN: 30 mg/dL — ABNORMAL HIGH (ref 8–23)
CO2: 26 mmol/L (ref 22–32)
Calcium: 10.3 mg/dL (ref 8.9–10.3)
Chloride: 101 mmol/L (ref 98–111)
Creatinine, Ser: 1.72 mg/dL — ABNORMAL HIGH (ref 0.44–1.00)
GFR, Estimated: 31 mL/min — ABNORMAL LOW (ref >60)
Glucose, Bld: 74 mg/dL (ref 70–99)
Potassium: 4.7 mmol/L (ref 3.5–5.1)
Sodium: 138 mmol/L (ref 135–145)

## 2024-04-10 LAB — TYPE AND SCREEN
ABO/RH(D): A POS
Antibody Screen: NEGATIVE

## 2024-04-10 LAB — CBC
HCT: 39 % (ref 36.0–46.0)
Hemoglobin: 12.7 g/dL (ref 12.0–15.0)
MCH: 31.3 pg (ref 26.0–34.0)
MCHC: 32.6 g/dL (ref 30.0–36.0)
MCV: 96.1 fL (ref 80.0–100.0)
Platelets: 365 K/uL (ref 150–400)
RBC: 4.06 MIL/uL (ref 3.87–5.11)
RDW: 12.6 % (ref 11.5–15.5)
WBC: 8.5 K/uL (ref 4.0–10.5)
nRBC: 0 % (ref 0.0–0.2)

## 2024-04-10 LAB — SURGICAL PCR SCREEN
MRSA, PCR: NEGATIVE
Staphylococcus aureus: NEGATIVE

## 2024-04-10 NOTE — Anesthesia Preprocedure Evaluation (Addendum)
 Anesthesia Evaluation  Patient identified by MRN, date of birth, ID band Patient awake    Reviewed: Allergy & Precautions, NPO status , Patient's Chart, lab work & pertinent test results  History of Anesthesia Complications Negative for: history of anesthetic complications  Airway Mallampati: III  TM Distance: >3 FB Neck ROM: Full    Dental  (+) Dental Advisory Given, Teeth Intact   Pulmonary neg shortness of breath, sleep apnea , neg COPD, neg recent URI, former smoker   breath sounds clear to auscultation       Cardiovascular hypertension, Pt. on medications and Pt. on home beta blockers (-) angina + CAD, + Past MI, + Cardiac Stents, + CABG and +CHF   Rhythm:Regular  1. Left ventricular ejection fraction, by estimation, is 55 to 60%. The  left ventricle has normal function. The left ventricle has no regional  wall motion abnormalities. Left ventricular diastolic parameters were  normal.   2. Right ventricular systolic function is low normal. The right  ventricular size is mildly enlarged.   3. The mitral valve is normal in structure. No evidence of mitral valve  regurgitation.   4. The aortic valve is grossly normal. Aortic valve regurgitation is not  visualized.      Ost LM lesion, 40 %stenosed.  Mid LAD-1 lesion, 0 %stenosed.  Ost 2nd Diag to 2nd Diag lesion, 0 %stenosed.  Mid LAD-2 lesion, 80 %stenosed.  Prox RCA to Mid RCA lesion, 10 %stenosed.  SVG.  Origin lesion, 100 %stenosed.  LIMA and is normal in caliber.  Origin-2 lesion, 30 %stenosed.  Origin-1 lesion, 0 %stenosed.  A drug eluting .  The left ventricular systolic function is normal.  LV end diastolic pressure is normal.  The left ventricular ejection fraction is 55-65% by visual estimate.   1. Single vessel obstructive CAD involving LAD 2. Patent LIMA to LAD 3. Known occlusion of SVG to diagonal 4. Normal LV function 5. Normal LVEDP       Neuro/Psych  Headaches PSYCHIATRIC DISORDERS Anxiety Depression    TIA   GI/Hepatic Neg liver ROS,GERD  ,,  Endo/Other  diabetes, Type 2    Renal/GU CRFRenal diseaseLab Results      Component                Value               Date                      NA                       138                 04/10/2024                K                        4.7                 04/10/2024                CO2                      26                  04/10/2024  GLUCOSE                  74                  04/10/2024                BUN                      30 (H)              04/10/2024                CREATININE               1.72 (H)            04/10/2024                CALCIUM                   10.3                04/10/2024                GFR                      59.79 (L)           12/03/2014                EGFR                     60 (L)              12/21/2014                GFRNONAA                 31 (L)              04/10/2024                Musculoskeletal  (+) Arthritis ,    Abdominal   Peds  Hematology negative hematology ROS (+) Lab Results      Component                Value               Date                      WBC                      8.5                 04/10/2024                HGB                      12.7                04/10/2024                HCT                      39.0                04/10/2024                MCV  96.1                04/10/2024                PLT                      365                 04/10/2024              Anesthesia Other Findings   Reproductive/Obstetrics                              Anesthesia Physical Anesthesia Plan  ASA: 4  Anesthesia Plan: General   Post-op Pain Management: Ofirmev  IV (intra-op)*   Induction: Intravenous  PONV Risk Score and Plan: 4 or greater and Ondansetron  and Dexamethasone   Airway Management Planned: Oral ETT  Additional Equipment:  None  Intra-op Plan:   Post-operative Plan: Extubation in OR  Informed Consent: I have reviewed the patients History and Physical, chart, labs and discussed the procedure including the risks, benefits and alternatives for the proposed anesthesia with the patient or authorized representative who has indicated his/her understanding and acceptance.     Dental advisory given  Plan Discussed with: CRNA  Anesthesia Plan Comments: (PAT note written 04/10/2024 by Allison Zelenak, PA-C.  )         Anesthesia Quick Evaluation

## 2024-04-10 NOTE — Progress Notes (Signed)
 Patient stated she fell a week ago and landed on her left side onto pavement.  The charge nurse and this nurse looked at the bruising which is on her left leg just below her buttocks and spreads down to just above her knee.  Patient denies going to the doctor for it and was instructed to contact her surgeon and let the surgeon know.   PCP - Dr. Alberta Sharps Cardiologist -  Wilbert Turner LOV 11-26-23 - cleared for surgery.  PPM/ICD - Denies Device Orders - n/a Rep Notified - n/a  Chest x-ray - n/a EKG - 11-26-23 Stress Test - 11-27-22 ECHO - 02-18-20 Cardiac Cath - 12-31-16  Sleep Study - Yes, positive CPAP - does not wear one; working with physician to get a bipap  Fasting Blood Sugar - 84-105 Checks Blood Sugar 2 times a day  Last dose of GLP1 agonist-  Denies GLP1 instructions: n/a  Blood Thinner Instructions:Denies Aspirin  Instructions: Patient stated last dose was over a week ago  ERAS Protcol - clears until 0800 PRE-SURGERY Ensure or G2- none  COVID TEST- n/a   Anesthesia review: yes, cardiac clearance, HTN, CHF, OSA, and DM.  Patient denies shortness of breath, fever, cough and chest pain at PAT appointment. Patient denies any respiratory issues at this time.    All instructions explained to the patient, with a verbal understanding of the material. Patient agrees to go over the instructions while at home for a better understanding. Patient also instructed to self quarantine after being tested for COVID-19. The opportunity to ask questions was provided.

## 2024-04-10 NOTE — Progress Notes (Addendum)
 Patient had a blood sugar of 42 upon arrival to her appointment. She drank 4 oz of OJ. Blood sugar rechecked after 15 minutes and it had risen to 58. Patient then ate 4 oz of applesauce.  Blood sugar rechecked and it had gone up to 62.  Charge nurse notified.  Patient offered to go to the ER and patient denied.    Patient's husband Dasie, was in waiting room and was informed of his wife's blood sugar.  Dasie stated he was taking her to get something to eat when they left.

## 2024-04-10 NOTE — Progress Notes (Signed)
 Anesthesia Chart Review:  Case: [id number] Date/Time: 04/13/24 1044   Procedure: POSTERIOR LUMBAR FUSION 1 LEVEL - L4-5 decompression with insitu fusion   Anesthesia type: General   Diagnosis: Spinal stenosis of lumbar region with neurogenic claudication [M48.062]   Pre-op  diagnosis: lumbar spinal stenosis with neurogenic claudication   [facility]: [facility] ROOM 04 / [facility]   Surgeons: Burnetta Aures, MD       DISCUSSION: Patient is a 73 year old female scheduled for the above procedure.  History includes former smoker (quit 1991), CAD (CABG: LIMA-LAD, SVG-DIAG 2008; DES LIMA-LAD, CTO SVG-D2 04/17/2026), chronic diastolic CHF (resolved ischemic cardiomyopathy), HLD, OSA (she is working on getting BiPAP set up), CKD, GERD.   Preoperative evaluation done at 11/26/2023 cardiology visit with Dr. Shlomo. She wrote,  Preoperative cardiovascular examination Her perioperative risk of a major cardiac event is 6.6% according to the Revised Cardiac Risk Index (RCRI).  Therefore, the patient is at moderate risk for perioperative complications.   The patient's  functional capacity is Fair at 4.64 METs according to the Duke Activity Status Index (DASI). Recommendations: According to ACC/AHA guidelines, no further cardiovascular testing needed.  The patient may proceed to surgery at acceptable risk.   -OK to hold ASA for surgery.   Cr 1.72, consistent with results from [facility].  Anesthesia team to evaluate on the day of surgery.  She reported last aspirin  over a week ago.   VS: BP 139/66   Pulse 73   Temp 36.7 C   Resp 17   Ht 4' 11 (1.499 m)   Wt 76.9 kg   SpO2 100%   BMI 34.26 kg/m   PROVIDERS: Claudene Pellet, MD is PCP  Shlomo Corning, MD is cardiologist  LABS: Labs reviewed: Acceptable for surgery. (all labs ordered are listed, but only abnormal results are displayed)  Labs Reviewed  GLUCOSE, CAPILLARY - Abnormal; Notable for the following components:      Result Value    Glucose-Capillary 42 (*)    All other components within normal limits  BASIC METABOLIC PANEL WITH GFR - Abnormal; Notable for the following components:   BUN 30 (*)    Creatinine, Ser 1.72 (*)    GFR, Estimated 31 (*)    All other components within normal limits  GLUCOSE, CAPILLARY - Abnormal; Notable for the following components:   Glucose-Capillary 58 (*)    All other components within normal limits  GLUCOSE, CAPILLARY - Abnormal; Notable for the following components:   Glucose-Capillary 62 (*)    All other components within normal limits  SURGICAL PCR SCREEN  CBC  TYPE AND SCREEN   A1c 6.9%, BUN 31, Cr 1.76, glucose 150 on 98/2025 ([facility])  IMAGES:   EKG: 11/26/2023: Normal sinus rhythm Nonspecific ST abnormality When compared with ECG of 17-Feb-2020 11:35, PREVIOUS ECG IS PRESENT Confirmed by Shlomo Corning ([id number]) on 11/26/2023 8:51:23 AM  CV: NM PET CT Perfusion Study 11/27/2022:   Normal perfusion study. No ischemia or infarction. MBF is not accurate in the setting of CABG.   LV perfusion is normal. There is no evidence of ischemia. There is no evidence of infarction.   Rest left ventricular function is normal. Rest EF: 61 %. Stress left ventricular function is normal. Stress EF: 63 %. End diastolic cavity size is normal.   Myocardial blood flow was computed to be 0.95ml/g/min at rest and 1.47ml/g/min at stress. Global myocardial blood flow reserve was 1.55. Myocardial blood flow reserve is not reported in this patient due to technical  or patient-specific concerns that affect accuracy.   Coronary calcium  assessment not performed due to prior revascularization.   The study is normal. The study is low risk.   Echo 02/18/2020: IMPRESSIONS   1. Left ventricular ejection fraction, by estimation, is 55 to 60%. The  left ventricle has normal function. The left ventricle has no regional  wall motion abnormalities. Left ventricular diastolic parameters were  normal.   2. Right  ventricular systolic function is low normal. The right  ventricular size is mildly enlarged.   3. The mitral valve is normal in structure. No evidence of mitral valve  regurgitation.   4. The aortic valve is grossly normal. Aortic valve regurgitation is not  visualized.  - Comparison(s): The left ventricular function is unchanged.    Cardiac cath 12/31/2016: Ost LM lesion, 40 %stenosed. Mid LAD-1 lesion, 0 %stenosed. Ost 2nd Diag to 2nd Diag lesion, 0 %stenosed. Mid LAD-2 lesion, 80 %stenosed. Prox RCA to Mid RCA lesion, 10 %stenosed. SVG. Origin lesion, 100 %stenosed. LIMA and is normal in caliber. Origin-2 lesion, 30 %stenosed. Origin-1 lesion, 0 %stenosed. A drug eluting . The left ventricular systolic function is normal. LV end diastolic pressure is normal. The left ventricular ejection fraction is 55-65% by visual estimate.   1. Single vessel obstructive CAD involving LAD 2. Patent LIMA to LAD 3. Known occlusion of SVG to diagonal 4. Normal LV function 5. Normal LVEDP   Plan: medical therapy.  Past Medical History:  Diagnosis Date   [facility]    [facility] I've had transfusion for low iron (04/17/2016)   Anxiety    Arthritis    left hip, fingers  (04/17/2016)   At risk for medication noncompliance    Atypical chest pain    a. atypical symptoms since prior cath.   CAD (coronary artery disease)    a. s/p 2007  LAD stent. b. s/p 07/2006 CABG with LIMA-LAD & SVG to Diag. c. 12/2006 diagonal branch w/ 2.5 x 12 mm Endeavor stent. d. s/p 2010 cath with small vessel disease treated with RX, stable by repeat 2015. e. CP/abnl nuc -> LHC 04/17/16: new ostial stenosis of LIMA-LAD s/p DES, EF 45%, chronically occ SVG-D2. f. Stable cath 12/2016.   Chronic combined systolic and diastolic CHF (congestive heart failure) (HCC)    CKD (chronic kidney disease), stage II    Depression    GERD (gastroesophageal reflux disease)    Headache(784.0)    maybe once/month  (04/17/2016)   Hyperlipidemia    Hypertension    Migraines    couple times/yr  ((04/17/2016)   Myocardial infarction [facility]) ?2008   Obesity    OSA on CPAP    suppose to wear mask; can't find it (10/17//2017) in process of trying for bipap   Osteopenia 12/2010 &01/21/2013   Minimal of hip DEXA    Pneumonia    several years (04/17/2016)   Retinopathy 06/2007   right   RLS (restless legs syndrome)    Thyroid  disease    blood work didn't show it but my dr said he can tell by looking at me; I had radiation (04/17/2016)   Type II diabetes mellitus (HCC)     Past Surgical History:  Procedure Laterality Date   CARDIAC CATHETERIZATION  02/03/2008   for CP patent LIMA w mid-vessel spasm.   CARDIAC CATHETERIZATION N/A 04/17/2016   Procedure: Left Heart Cath and Cors/Grafts Angiography;  Surgeon: Peter M Swaziland, MD;  Location: [facility] INVASIVE CV LAB;  Service: Cardiovascular;  Laterality: N/A;   CARDIAC CATHETERIZATION N/A 04/17/2016   Procedure: Coronary Stent Intervention;  Surgeon: Peter M Swaziland, MD;  Location: [facility] INVASIVE CV LAB;  Service: Cardiovascular;  Laterality: N/A;   COLONOSCOPY  10/2010   rectal polyp   CORONARY ANGIOPLASTY WITH STENT PLACEMENT     I've had quite a few stents    CORONARY ANGIOPLASTY WITH STENT PLACEMENT  04/17/2016   CORONARY ARTERY BYPASS GRAFT  ?2008   s/p two vessel CABG off pump LAD diagonal and PTCA/DE Stent mid LAD/ DE stent LAD diagonal through previous stent sstruts,occluded SVG-diagonal   LAPAROSCOPIC CHOLECYSTECTOMY  1990's   LEFT HEART CATH AND CORS/GRAFTS ANGIOGRAPHY N/A 12/31/2016   Procedure: Left Heart Cath and Cors/Grafts Angiography;  Surgeon: Swaziland, Peter M, MD;  Location: [facility] INVASIVE CV LAB;  Service: Cardiovascular;  Laterality: N/A;   LEFT HEART CATHETERIZATION WITH CORONARY ANGIOGRAM N/A 07/15/2013   Procedure: LEFT HEART CATHETERIZATION WITH CORONARY ANGIOGRAM;  Surgeon: Peter M Swaziland, MD;  Location: [facility] CATH LAB;  Service:  Cardiovascular;  Laterality: N/A;   TUBAL LIGATION  1976    MEDICATIONS:  Empagliflozin-linaGLIPtin (GLYXAMBI) 25-5 MG TABS   acetaminophen  (TYLENOL ) 500 MG tablet   aspirin  EC 81 MG tablet   atorvastatin  (LIPITOR ) 80 MG tablet   Cholecalciferol (VITAMIN D3) 250 MCG (10000 UT) TABS   cyanocobalamin (VITAMIN B12) 1000 MCG tablet   ezetimibe  (ZETIA ) 10 MG tablet   fenofibrate  (TRICOR ) 145 MG tablet   furosemide  (LASIX ) 20 MG tablet   glimepiride (AMARYL) 4 MG tablet   icosapent  Ethyl (VASCEPA ) 1 g capsule   isosorbide  mononitrate (IMDUR ) 60 MG 24 hr tablet   Magnesium  400 MG CAPS   metFORMIN  (GLUCOPHAGE ) 500 MG tablet   metoprolol  tartrate (LOPRESSOR ) 25 MG tablet   nitroGLYCERIN  (NITROSTAT ) 0.4 MG SL tablet   ranolazine  (RANEXA ) 500 MG 12 hr tablet   No current facility-administered medications for this encounter.    Isaiah Ruder, PA-C Surgical Short Stay/Anesthesiology [facility] Phone [phone] [facility] Phone [phone] 04/10/2024 3:53 PM

## 2024-04-13 ENCOUNTER — Ambulatory Visit (HOSPITAL_COMMUNITY): Payer: Self-pay | Admitting: Vascular Surgery

## 2024-04-13 ENCOUNTER — Ambulatory Visit (HOSPITAL_COMMUNITY): Admitting: Anesthesiology

## 2024-04-13 ENCOUNTER — Other Ambulatory Visit: Payer: Self-pay

## 2024-04-13 ENCOUNTER — Encounter (HOSPITAL_COMMUNITY): Payer: Self-pay | Admitting: Orthopedic Surgery

## 2024-04-13 ENCOUNTER — Observation Stay (HOSPITAL_COMMUNITY)
Admission: RE | Admit: 2024-04-13 | Discharge: 2024-04-14 | Disposition: A | Attending: Orthopedic Surgery | Admitting: Orthopedic Surgery

## 2024-04-13 ENCOUNTER — Ambulatory Visit (HOSPITAL_COMMUNITY)

## 2024-04-13 ENCOUNTER — Encounter (HOSPITAL_COMMUNITY)
Admission: RE | Disposition: A | Discharge status: Home / Self Care | Payer: Self-pay | Source: Home / Self Care | Attending: Orthopedic Surgery

## 2024-04-13 DIAGNOSIS — Z951 Presence of aortocoronary bypass graft: Secondary | ICD-10-CM | POA: Diagnosis not present

## 2024-04-13 DIAGNOSIS — Z0189 Encounter for other specified special examinations: Secondary | ICD-10-CM | POA: Diagnosis not present

## 2024-04-13 DIAGNOSIS — I2510 Atherosclerotic heart disease of native coronary artery without angina pectoris: Secondary | ICD-10-CM | POA: Diagnosis not present

## 2024-04-13 DIAGNOSIS — M549 Dorsalgia, unspecified: Secondary | ICD-10-CM | POA: Diagnosis present

## 2024-04-13 DIAGNOSIS — Z7982 Long term (current) use of aspirin: Secondary | ICD-10-CM | POA: Insufficient documentation

## 2024-04-13 DIAGNOSIS — Z7984 Long term (current) use of oral hypoglycemic drugs: Secondary | ICD-10-CM | POA: Diagnosis not present

## 2024-04-13 DIAGNOSIS — M48062 Spinal stenosis, lumbar region with neurogenic claudication: Secondary | ICD-10-CM | POA: Diagnosis not present

## 2024-04-13 DIAGNOSIS — Z87891 Personal history of nicotine dependence: Secondary | ICD-10-CM | POA: Insufficient documentation

## 2024-04-13 DIAGNOSIS — M4800 Spinal stenosis, site unspecified: Principal | ICD-10-CM | POA: Diagnosis present

## 2024-04-13 DIAGNOSIS — I10 Essential (primary) hypertension: Secondary | ICD-10-CM | POA: Diagnosis not present

## 2024-04-13 DIAGNOSIS — N182 Chronic kidney disease, stage 2 (mild): Secondary | ICD-10-CM | POA: Diagnosis not present

## 2024-04-13 DIAGNOSIS — E1122 Type 2 diabetes mellitus with diabetic chronic kidney disease: Secondary | ICD-10-CM | POA: Insufficient documentation

## 2024-04-13 DIAGNOSIS — I130 Hypertensive heart and chronic kidney disease with heart failure and stage 1 through stage 4 chronic kidney disease, or unspecified chronic kidney disease: Secondary | ICD-10-CM | POA: Insufficient documentation

## 2024-04-13 DIAGNOSIS — M5416 Radiculopathy, lumbar region: Secondary | ICD-10-CM | POA: Diagnosis not present

## 2024-04-13 DIAGNOSIS — M4186 Other forms of scoliosis, lumbar region: Secondary | ICD-10-CM | POA: Diagnosis not present

## 2024-04-13 DIAGNOSIS — Z79899 Other long term (current) drug therapy: Secondary | ICD-10-CM | POA: Insufficient documentation

## 2024-04-13 DIAGNOSIS — I5042 Chronic combined systolic (congestive) and diastolic (congestive) heart failure: Secondary | ICD-10-CM | POA: Insufficient documentation

## 2024-04-13 DIAGNOSIS — M4156 Other secondary scoliosis, lumbar region: Secondary | ICD-10-CM | POA: Diagnosis not present

## 2024-04-13 LAB — GLUCOSE, CAPILLARY
Glucose-Capillary: 164 mg/dL — ABNORMAL HIGH (ref 70–99)
Glucose-Capillary: 144 mg/dL — ABNORMAL HIGH (ref 70–99)
Glucose-Capillary: 130 mg/dL — ABNORMAL HIGH (ref 70–99)
Glucose-Capillary: 145 mg/dL — ABNORMAL HIGH (ref 70–99)
Glucose-Capillary: 329 mg/dL — ABNORMAL HIGH (ref 70–99)

## 2024-04-13 LAB — HEMOGLOBIN A1C
Hgb A1c MFr Bld: 6.7 % — ABNORMAL HIGH (ref 4.8–5.6)
Mean Plasma Glucose: 145.59 mg/dL

## 2024-04-13 SURGERY — POSTERIOR LUMBAR FUSION 1 LEVEL
Anesthesia: General | Site: Spine Lumbar

## 2024-04-13 MED ORDER — LACTATED RINGERS IV SOLN: INTRAVENOUS | Status: DC

## 2024-04-13 MED ORDER — METHOCARBAMOL 500 MG PO TABS: 500.0000 mg | ORAL_TABLET | Freq: Three times a day (TID) | ORAL | 0 refills | Status: AC | PRN

## 2024-04-13 MED ORDER — CEFAZOLIN SODIUM-DEXTROSE 1-4 GM/50ML-% IV SOLN
1.0000 g | Freq: Three times a day (TID) | INTRAVENOUS | Status: AC
  Administered 2024-04-13 – 2024-04-14 (×2): 1 g via INTRAVENOUS
  Filled 2024-04-13 (×2): qty 50

## 2024-04-13 MED ORDER — GLIMEPIRIDE 2 MG PO TABS
4.0000 mg | ORAL_TABLET | Freq: Two times a day (BID) | ORAL | Status: DC
  Administered 2024-04-13 – 2024-04-14 (×2): 4 mg via ORAL
  Filled 2024-04-13 (×2): qty 2

## 2024-04-13 MED ORDER — INSULIN ASPART 100 UNIT/ML IJ SOLN: 0.0000 [IU] | INTRAMUSCULAR | Status: DC | PRN

## 2024-04-13 MED ORDER — OXYCODONE HCL 5 MG PO TABS
ORAL_TABLET | ORAL | Status: AC
  Filled 2024-04-13: qty 1

## 2024-04-13 MED ORDER — INSULIN ASPART 100 UNIT/ML IJ SOLN
0.0000 [IU] | Freq: Every day | INTRAMUSCULAR | Status: DC
  Administered 2024-04-13: 4 [IU] via SUBCUTANEOUS

## 2024-04-13 MED ORDER — POLYETHYLENE GLYCOL 3350 17 G PO PACK
17.0000 g | PACK | Freq: Every day | ORAL | Status: DC | PRN
  Administered 2024-04-13: 17 g via ORAL
  Filled 2024-04-13: qty 1

## 2024-04-13 MED ORDER — EZETIMIBE 10 MG PO TABS
10.0000 mg | ORAL_TABLET | Freq: Every day | ORAL | Status: DC
  Administered 2024-04-13 – 2024-04-14 (×2): 10 mg via ORAL
  Filled 2024-04-13 (×2): qty 1

## 2024-04-13 MED ORDER — ATORVASTATIN CALCIUM 80 MG PO TABS
80.0000 mg | ORAL_TABLET | Freq: Every day | ORAL | Status: DC
  Administered 2024-04-13 – 2024-04-14 (×2): 80 mg via ORAL
  Filled 2024-04-13 (×2): qty 1

## 2024-04-13 MED ORDER — CEFAZOLIN SODIUM-DEXTROSE 2-4 GM/100ML-% IV SOLN
2.0000 g | INTRAVENOUS | Status: AC
  Administered 2024-04-13: 2 g via INTRAVENOUS
  Filled 2024-04-13: qty 100

## 2024-04-13 MED ORDER — FENTANYL CITRATE (PF) 100 MCG/2ML IJ SOLN
INTRAMUSCULAR | Status: AC
  Filled 2024-04-13: qty 2

## 2024-04-13 MED ORDER — FUROSEMIDE 20 MG PO TABS
20.0000 mg | ORAL_TABLET | Freq: Every morning | ORAL | Status: DC
  Administered 2024-04-14: 20 mg via ORAL
  Filled 2024-04-13: qty 1

## 2024-04-13 MED ORDER — METHOCARBAMOL 500 MG PO TABS
500.0000 mg | ORAL_TABLET | Freq: Four times a day (QID) | ORAL | Status: DC | PRN
  Administered 2024-04-14: 500 mg via ORAL
  Filled 2024-04-13: qty 1

## 2024-04-13 MED ORDER — LIDOCAINE 2% (20 MG/ML) 5 ML SYRINGE
INTRAMUSCULAR | Status: AC
  Filled 2024-04-13: qty 5

## 2024-04-13 MED ORDER — BUPIVACAINE-EPINEPHRINE 0.25% -1:200000 IJ SOLN
INTRAMUSCULAR | Status: DC | PRN
  Administered 2024-04-13: 10 mL

## 2024-04-13 MED ORDER — METFORMIN HCL 500 MG PO TABS
500.0000 mg | ORAL_TABLET | Freq: Two times a day (BID) | ORAL | Status: DC
  Administered 2024-04-13 – 2024-04-14 (×2): 500 mg via ORAL
  Filled 2024-04-13 (×2): qty 1

## 2024-04-13 MED ORDER — ACETAMINOPHEN 10 MG/ML IV SOLN: 1000.0000 mg | Freq: Once | INTRAVENOUS | Status: DC | PRN

## 2024-04-13 MED ORDER — INSULIN ASPART 100 UNIT/ML IJ SOLN
0.0000 [IU] | Freq: Three times a day (TID) | INTRAMUSCULAR | Status: DC
  Administered 2024-04-13: 2 [IU] via SUBCUTANEOUS
  Administered 2024-04-14: 3 [IU] via SUBCUTANEOUS

## 2024-04-13 MED ORDER — SODIUM CHLORIDE 0.9% FLUSH
3.0000 mL | Freq: Two times a day (BID) | INTRAVENOUS | Status: DC
  Administered 2024-04-13 (×2): 3 mL via INTRAVENOUS

## 2024-04-13 MED ORDER — NITROGLYCERIN 0.4 MG SL SUBL: 0.4000 mg | SUBLINGUAL_TABLET | SUBLINGUAL | Status: DC | PRN

## 2024-04-13 MED ORDER — SUCCINYLCHOLINE CHLORIDE 200 MG/10ML IV SOSY
PREFILLED_SYRINGE | INTRAVENOUS | Status: AC
  Filled 2024-04-13: qty 10

## 2024-04-13 MED ORDER — ROCURONIUM BROMIDE 10 MG/ML (PF) SYRINGE
PREFILLED_SYRINGE | INTRAVENOUS | Status: AC
  Filled 2024-04-13: qty 10

## 2024-04-13 MED ORDER — OXYCODONE HCL 5 MG PO TABS: 10.0000 mg | ORAL_TABLET | ORAL | Status: DC | PRN

## 2024-04-13 MED ORDER — ROCURONIUM BROMIDE 100 MG/10ML IV SOLN
INTRAVENOUS | Status: DC | PRN
  Administered 2024-04-13: 70 mg via INTRAVENOUS
  Administered 2024-04-13: 20 mg via INTRAVENOUS

## 2024-04-13 MED ORDER — ACETAMINOPHEN 10 MG/ML IV SOLN
INTRAVENOUS | Status: DC | PRN
  Administered 2024-04-13: 1000 mg via INTRAVENOUS

## 2024-04-13 MED ORDER — ACETAMINOPHEN 10 MG/ML IV SOLN
INTRAVENOUS | Status: AC
  Filled 2024-04-13: qty 100

## 2024-04-13 MED ORDER — ONDANSETRON HCL 4 MG/2ML IJ SOLN: 4.0000 mg | Freq: Four times a day (QID) | INTRAMUSCULAR | Status: DC | PRN

## 2024-04-13 MED ORDER — ORAL CARE MOUTH RINSE
15.0000 mL | Freq: Once | OROMUCOSAL | Status: AC
Start: 2024-04-13 — End: 2024-04-13

## 2024-04-13 MED ORDER — METHOCARBAMOL 1000 MG/10ML IJ SOLN: 500.0000 mg | Freq: Four times a day (QID) | INTRAMUSCULAR | Status: DC | PRN

## 2024-04-13 MED ORDER — HYDROMORPHONE HCL 1 MG/ML IJ SOLN: 0.5000 mg | INTRAMUSCULAR | Status: DC | PRN

## 2024-04-13 MED ORDER — ISOSORBIDE MONONITRATE ER 60 MG PO TB24
60.0000 mg | ORAL_TABLET | Freq: Every day | ORAL | Status: DC
  Administered 2024-04-14: 60 mg via ORAL
  Filled 2024-04-13: qty 1

## 2024-04-13 MED ORDER — EMPAGLIFLOZIN-LINAGLIPTIN 25-5 MG PO TABS: 1.0000 | ORAL_TABLET | Freq: Every morning | ORAL | Status: DC

## 2024-04-13 MED ORDER — THROMBIN 20000 UNITS EX SOLR
CUTANEOUS | Status: AC
  Filled 2024-04-13: qty 20000

## 2024-04-13 MED ORDER — SODIUM CHLORIDE 0.9 % IV SOLN: INTRAVENOUS | Status: DC | PRN

## 2024-04-13 MED ORDER — LIDOCAINE 2% (20 MG/ML) 5 ML SYRINGE
INTRAMUSCULAR | Status: DC | PRN
Start: 2024-04-13 — End: 2024-04-13
  Administered 2024-04-13: 60 mg via INTRAVENOUS

## 2024-04-13 MED ORDER — ONDANSETRON HCL 4 MG PO TABS: 4.0000 mg | ORAL_TABLET | Freq: Four times a day (QID) | ORAL | Status: DC | PRN

## 2024-04-13 MED ORDER — LINAGLIPTIN 5 MG PO TABS
5.0000 mg | ORAL_TABLET | Freq: Every day | ORAL | Status: DC
  Administered 2024-04-14: 5 mg via ORAL
  Filled 2024-04-13: qty 1

## 2024-04-13 MED ORDER — SUGAMMADEX SODIUM 200 MG/2ML IV SOLN
INTRAVENOUS | Status: DC | PRN
Start: 2024-04-13 — End: 2024-04-13
  Administered 2024-04-13: 200 mg via INTRAVENOUS

## 2024-04-13 MED ORDER — ONDANSETRON HCL 4 MG/2ML IJ SOLN
INTRAMUSCULAR | Status: DC | PRN
Start: 2024-04-13 — End: 2024-04-13
  Administered 2024-04-13: 4 mg via INTRAVENOUS

## 2024-04-13 MED ORDER — BUPIVACAINE-EPINEPHRINE (PF) 0.25% -1:200000 IJ SOLN
INTRAMUSCULAR | Status: AC
  Filled 2024-04-13: qty 30

## 2024-04-13 MED ORDER — SENNOSIDES-DOCUSATE SODIUM 8.6-50 MG PO TABS
1.0000 | ORAL_TABLET | Freq: Two times a day (BID) | ORAL | Status: DC
  Administered 2024-04-13 – 2024-04-14 (×2): 1 via ORAL
  Filled 2024-04-13 (×2): qty 1

## 2024-04-13 MED ORDER — OXYCODONE HCL 5 MG PO TABS
5.0000 mg | ORAL_TABLET | Freq: Once | ORAL | Status: AC | PRN
  Administered 2024-04-13: 5 mg via ORAL

## 2024-04-13 MED ORDER — FENTANYL CITRATE (PF) 250 MCG/5ML IJ SOLN
INTRAMUSCULAR | Status: AC
  Filled 2024-04-13: qty 5

## 2024-04-13 MED ORDER — RANOLAZINE ER 500 MG PO TB12
500.0000 mg | ORAL_TABLET | Freq: Two times a day (BID) | ORAL | Status: DC
  Administered 2024-04-13 – 2024-04-14 (×2): 500 mg via ORAL
  Filled 2024-04-13 (×2): qty 1

## 2024-04-13 MED ORDER — OXYCODONE HCL 5 MG PO TABS
5.0000 mg | ORAL_TABLET | ORAL | Status: DC | PRN
  Administered 2024-04-13 – 2024-04-14 (×3): 5 mg via ORAL
  Filled 2024-04-13 (×3): qty 1

## 2024-04-13 MED ORDER — DEXAMETHASONE SOD PHOSPHATE PF 10 MG/ML IJ SOLN
INTRAMUSCULAR | Status: DC | PRN
  Administered 2024-04-13: 10 mg via INTRAVENOUS

## 2024-04-13 MED ORDER — ONDANSETRON HCL 4 MG PO TABS: 4.0000 mg | ORAL_TABLET | Freq: Three times a day (TID) | ORAL | 0 refills | Status: AC | PRN

## 2024-04-13 MED ORDER — 0.9 % SODIUM CHLORIDE (POUR BTL) OPTIME
TOPICAL | Status: DC | PRN
  Administered 2024-04-13: 1000 mL

## 2024-04-13 MED ORDER — OXYCODONE-ACETAMINOPHEN 10-325 MG PO TABS: 1.0000 | ORAL_TABLET | Freq: Four times a day (QID) | ORAL | 0 refills | Status: AC | PRN

## 2024-04-13 MED ORDER — PROPOFOL 10 MG/ML IV BOLUS
INTRAVENOUS | Status: AC
  Filled 2024-04-13: qty 20

## 2024-04-13 MED ORDER — SODIUM CHLORIDE 0.9 % IV SOLN
250.0000 mL | INTRAVENOUS | Status: DC
  Administered 2024-04-13: 250 mL via INTRAVENOUS

## 2024-04-13 MED ORDER — ACETAMINOPHEN 325 MG PO TABS: 650.0000 mg | ORAL_TABLET | ORAL | Status: DC | PRN

## 2024-04-13 MED ORDER — CHLORHEXIDINE GLUCONATE 0.12 % MT SOLN
OROMUCOSAL | Status: AC
Start: 2024-04-13 — End: 2024-04-13
  Filled 2024-04-13: qty 15

## 2024-04-13 MED ORDER — SURGIFLO WITH THROMBIN (HEMOSTATIC MATRIX KIT) OPTIME
TOPICAL | Status: DC | PRN
  Administered 2024-04-13: 1 via TOPICAL

## 2024-04-13 MED ORDER — CHLORHEXIDINE GLUCONATE 0.12 % MT SOLN
15.0000 mL | Freq: Once | OROMUCOSAL | Status: AC
  Administered 2024-04-13: 15 mL via OROMUCOSAL

## 2024-04-13 MED ORDER — ONDANSETRON HCL 4 MG/2ML IJ SOLN
INTRAMUSCULAR | Status: AC
  Filled 2024-04-13: qty 2

## 2024-04-13 MED ORDER — ACETAMINOPHEN 650 MG RE SUPP: 650.0000 mg | RECTAL | Status: DC | PRN

## 2024-04-13 MED ORDER — EMPAGLIFLOZIN 25 MG PO TABS
25.0000 mg | ORAL_TABLET | Freq: Every day | ORAL | Status: DC
  Administered 2024-04-14: 25 mg via ORAL
  Filled 2024-04-13: qty 1

## 2024-04-13 MED ORDER — SODIUM CHLORIDE 0.9% FLUSH: 3.0000 mL | INTRAVENOUS | Status: DC | PRN

## 2024-04-13 MED ORDER — FENTANYL CITRATE (PF) 250 MCG/5ML IJ SOLN
INTRAMUSCULAR | Status: DC | PRN
  Administered 2024-04-13: 100 ug via INTRAVENOUS

## 2024-04-13 MED ORDER — METOPROLOL TARTRATE 25 MG PO TABS
25.0000 mg | ORAL_TABLET | Freq: Two times a day (BID) | ORAL | Status: DC
  Administered 2024-04-13 – 2024-04-14 (×2): 25 mg via ORAL
  Filled 2024-04-13 (×2): qty 1

## 2024-04-13 MED ORDER — FENTANYL CITRATE (PF) 100 MCG/2ML IJ SOLN
25.0000 ug | INTRAMUSCULAR | Status: DC | PRN
  Administered 2024-04-13: 25 ug via INTRAVENOUS

## 2024-04-13 MED ORDER — OXYCODONE HCL 5 MG/5ML PO SOLN: 5.0000 mg | Freq: Once | ORAL | Status: AC | PRN

## 2024-04-13 MED ORDER — MAGNESIUM CITRATE PO SOLN
1.0000 | Freq: Once | ORAL | Status: AC | PRN
  Administered 2024-04-14: 1 via ORAL
  Filled 2024-04-13: qty 296

## 2024-04-13 MED ORDER — LIDOCAINE HCL (CARDIAC) PF 100 MG/5ML IV SOSY: PREFILLED_SYRINGE | INTRAVENOUS | Status: DC | PRN

## 2024-04-13 MED ORDER — THROMBIN 20000 UNITS EX SOLR
CUTANEOUS | Status: DC | PRN
  Administered 2024-04-13: 20 mL via TOPICAL

## 2024-04-13 MED ORDER — PROPOFOL 500 MG/50ML IV EMUL
INTRAVENOUS | Status: DC | PRN
  Administered 2024-04-13: 80 mg via INTRAVENOUS

## 2024-04-13 SURGICAL SUPPLY — 56 items
BAG COUNTER SPONGE SURGICOUNT (BAG) ×2 IMPLANT
BLADE CLIPPER SURG (BLADE) IMPLANT
BUR EGG ELITE 4.0 (BURR) ×2 IMPLANT
CABLE BIPOLOR RESECTION CORD (MISCELLANEOUS) ×2 IMPLANT
CANISTER SUCTION 3000ML PPV (SUCTIONS) ×2 IMPLANT
CLSR STERI-STRIP ANTIMIC 1/2X4 (GAUZE/BANDAGES/DRESSINGS) ×2 IMPLANT
CNTNR URN SCR LID CUP LEK RST (MISCELLANEOUS) IMPLANT
COVER SURGICAL LIGHT HANDLE (MISCELLANEOUS) ×2 IMPLANT
DRAPE C-ARM 42X72 X-RAY (DRAPES) ×2 IMPLANT
DRAPE C-ARMOR (DRAPES) ×2 IMPLANT
DRAPE POUCH INSTRU U-SHP 10X18 (DRAPES) ×2 IMPLANT
DRAPE SURG 17X23 STRL (DRAPES) ×2 IMPLANT
DRAPE U-SHAPE 47X51 STRL (DRAPES) ×2 IMPLANT
DRSG OPSITE POSTOP 4X6 (GAUZE/BANDAGES/DRESSINGS) IMPLANT
DRSG OPSITE POSTOP 4X8 (GAUZE/BANDAGES/DRESSINGS) ×2 IMPLANT
DURAPREP 26ML APPLICATOR (WOUND CARE) ×2 IMPLANT
ELECT BLADE 6.5 EXT (BLADE) IMPLANT
ELECT PENCIL ROCKER SW 15FT (MISCELLANEOUS) ×2 IMPLANT
ELECTRODE BLDE 4.0 EZ CLN MEGD (MISCELLANEOUS) ×2 IMPLANT
ELECTRODE REM PT RTRN 9FT ADLT (ELECTROSURGICAL) ×2 IMPLANT
GLOVE BIOGEL PI IND STRL 8.5 (GLOVE) ×2 IMPLANT
GLOVE SS BIOGEL STRL SZ 8.5 (GLOVE) ×2 IMPLANT
GOWN STRL REUS W/ TWL LRG LVL3 (GOWN DISPOSABLE) ×2 IMPLANT
GOWN STRL REUS W/TWL 2XL LVL3 (GOWN DISPOSABLE) ×4 IMPLANT
KIT BASIN OR (CUSTOM PROCEDURE TRAY) ×2 IMPLANT
KIT POSITIONER JACKSON TABLE (MISCELLANEOUS) IMPLANT
KIT TURNOVER KIT B (KITS) ×2 IMPLANT
NDL 22X1.5 STRL (OR ONLY) (MISCELLANEOUS) ×2 IMPLANT
NDL SPNL 18GX3.5 QUINCKE PK (NEEDLE) ×4 IMPLANT
NEEDLE 22X1.5 STRL (OR ONLY) (MISCELLANEOUS) ×1 IMPLANT
NEEDLE SPNL 18GX3.5 QUINCKE PK (NEEDLE) ×3 IMPLANT
PACK LAMINECTOMY ORTHO (CUSTOM PROCEDURE TRAY) ×2 IMPLANT
PACK UNIVERSAL I (CUSTOM PROCEDURE TRAY) ×2 IMPLANT
PAD ARMBOARD POSITIONER FOAM (MISCELLANEOUS) ×4 IMPLANT
PATTIES SURGICAL .5 X.5 (GAUZE/BANDAGES/DRESSINGS) IMPLANT
PATTIES SURGICAL .5 X1 (DISPOSABLE) ×2 IMPLANT
POSITIONER HEAD PRONE TRACH (MISCELLANEOUS) ×2 IMPLANT
SOLN 0.9% NACL 1000 ML (IV SOLUTION) ×1 IMPLANT
SOLN 0.9% NACL POUR BTL 1000ML (IV SOLUTION) ×2 IMPLANT
SOLN STERILE WATER 1000 ML (IV SOLUTION) ×1 IMPLANT
SOLN STERILE WATER BTL 1000 ML (IV SOLUTION) ×2 IMPLANT
SPONGE SURGIFOAM ABS GEL 100 (HEMOSTASIS) ×2 IMPLANT
SPONGE T-LAP 4X18 ~~LOC~~+RFID (SPONGE) ×4 IMPLANT
SURGIFLO W/THROMBIN 8M KIT (HEMOSTASIS) IMPLANT
SUT BONE WAX W31G (SUTURE) ×2 IMPLANT
SUT MNCRL AB 3-0 PS2 27 (SUTURE) ×4 IMPLANT
SUT STRATAFIX 1PDS 45CM VIOLET (SUTURE) IMPLANT
SUT VIC AB 1 CT1 18XCR BRD 8 (SUTURE) ×2 IMPLANT
SUT VIC AB 2-0 CT1 18 (SUTURE) ×2 IMPLANT
SYR 3ML LL SCALE MARK (SYRINGE) IMPLANT
SYR BULB IRRIG 60ML STRL (SYRINGE) ×2 IMPLANT
SYR CONTROL 10ML LL (SYRINGE) ×2 IMPLANT
TOWEL GREEN STERILE (TOWEL DISPOSABLE) ×2 IMPLANT
TOWEL GREEN STERILE FF (TOWEL DISPOSABLE) ×2 IMPLANT
TRAY FOLEY MTR SLVR 16FR STAT (SET/KITS/TRAYS/PACK) ×2 IMPLANT
YANKAUER SUCT BULB TIP NO VENT (SUCTIONS) ×2 IMPLANT

## 2024-04-13 NOTE — H&P (Signed)
 History:  The patient is a 73 year old female who presents for pre-operative visit in preparation for their L4-L5 decompression with insitu fusion, which is scheduled on 04/13/24 with Dr. Donaciano Sprang, MD at [facility]. The patient has had symptoms in the Back including pain and radiculopathy which has impacted their quality of life and ability to do activities of daily living. The patient currently has a diagnosis of lumbar spinal stenosis and has failed conservative treatments including activity modification, PT, and injections . The patient has had no previous surgery . The patient denies an active infection.  Past Medical History:  Diagnosis Date   An[facility]    [facility]; I've had transfusion for low iron (04/17/2016)   Anxiety    Arthritis    left hip, fingers  (04/17/2016)   At risk for medication noncompliance    Atypical chest pain    a. atypical symptoms since prior cath.   CAD (coronary artery disease)    a. s/p 2007  LAD stent. b. s/p 07/2006 CABG with LIMA-LAD & SVG to Diag. c. 12/2006 diagonal branch w/ 2.5 x 12 mm Endeavor stent. d. s/p 2010 cath with small vessel disease treated with RX, stable by repeat 2015. e. CP/abnl nuc -> LHC 04/17/16: new ostial stenosis of LIMA-LAD s/p DES, EF 45%, chronically occ SVG-D2. f. Stable cath 12/2016.   Chronic combined systolic and diastolic CHF (congestive heart failure) (HCC)    CKD (chronic kidney disease)    CKD (chronic kidney disease), stage II    Depression    GERD (gastroesophageal reflux disease)    Headache(784.0)    maybe once/month (04/17/2016)   Hyperlipidemia    Hypertension    Migraines    couple times/yr  ((04/17/2016)   Myocardial infarction [facility]) ?2008   Obesity    OSA on CPAP    suppose to wear mask; can't find it (10/17//2017) in process of trying for bipap   Osteopenia 12/2010 &01/21/2013   Minimal of hip DEXA    Pneumonia    several years (04/17/2016)   Retinopathy  06/2007   right   RLS (restless legs syndrome)    Thyroid  disease    blood work didn't show it but my dr said he can tell by looking at me; I had radiation (04/17/2016)   Type II diabetes mellitus (HCC)     No Known Allergies  No current facility-administered medications on file prior to encounter.   Current Outpatient Medications on File Prior to Encounter  Medication Sig Dispense Refill   acetaminophen  (TYLENOL ) 500 MG tablet Take 2 tablets (1,000 mg total) by mouth every 6 (six) hours as needed for moderate pain or headache.     aspirin  EC 81 MG tablet Take 1 tablet (81 mg total) by mouth daily.     atorvastatin  (LIPITOR ) 80 MG tablet Take 80 mg by mouth at bedtime.     Cholecalciferol (VITAMIN D3) 250 MCG (10000 UT) TABS Take 250 mcg by mouth in the morning.     cyanocobalamin (VITAMIN B12) 1000 MCG tablet Take 1,000 mcg by mouth in the morning.     Empagliflozin-linaGLIPtin (GLYXAMBI) 25-5 MG TABS Take 1 tablet by mouth in the morning.     ezetimibe  (ZETIA ) 10 MG tablet TAKE 1 TABLET BY MOUTH DAILY 90 tablet 3   fenofibrate  (TRICOR ) 145 MG tablet TAKE 1 TABLET BY MOUTH DAILY 90 tablet 3   furosemide  (LASIX ) 20 MG tablet Take  20 mg by mouth in the morning.     glimepiride (AMARYL) 4 MG tablet Take 4 mg by mouth 2 (two) times daily.     icosapent  Ethyl (VASCEPA ) 1 g capsule Take 2 capsules (2 g total) by mouth 2 (two) times daily. 120 capsule 11   isosorbide  mononitrate (IMDUR ) 60 MG 24 hr tablet Take 1 tablet (60 mg total) by mouth daily. 90 tablet 3   Magnesium  400 MG CAPS Take 400 mg by mouth in the morning.     metFORMIN  (GLUCOPHAGE ) 500 MG tablet Take 500 mg by mouth in the morning and at bedtime.     metoprolol  tartrate (LOPRESSOR ) 25 MG tablet Take 1 tablet (25 mg total) by mouth 2 (two) times daily. 180 tablet 3   ranolazine  (RANEXA ) 500 MG 12 hr tablet TAKE 1 TABLET BY MOUTH TWICE  DAILY 200 tablet 3   nitroGLYCERIN  (NITROSTAT ) 0.4 MG SL tablet DISSOLVE 1 TABLET UNDER THE   TONGUE EVERY 5 MINUTES AS NEEDED FOR CHEST PAIN. MAX OF 3 TABLETS IN 15 MINUTES. CALL 911 IF PAIN  PERSISTS. 75 tablet 3    Physical Exam: Vitals:   04/13/24 0851  BP: (!) 150/60  Pulse: 73  Resp: 18  Temp: 97.8 F (36.6 C)  SpO2: 98%   Body mass index is 34.13 kg/m. General: AAOX3, well developed and well nourished, NAD Ambulation Normal uses no assitive devices Heart: RRR. Lungs: Normal pulmonary effort, no signs of respiratory distress Abdomen: Normal bowel soundsX4, soft, non-tender, no hepatosplenomegaly.  Skin: No abnormal lesions, abrasions or contusions. MSK: No obvious bony abnormalities, tenderness to palpation over bilateral SI joints, R>L AROM Lumbar Spine: -Spine: normal ROM, pain elicited with fwd flexion and extension  - Knee: flexion and extension normal and pain free bilaterally.  - Ankle: Dorsiflexion, plantarflexion, inversion, eversion normal and pain free.   Dermatomes:Lower extremity sensation to light touch intact bilaterally with negative dysathesias   Myotomes:   - L2: Left 5/5, Right 5/5  -L3: Left 5/5, Right 5/5  - L4: Left 5/5, Right 5/5  - L5: Left 5/5, Right 5/5  - S1: Left 5/5, Right 5/5    Reflexes:   - Patella: Left2+, Right 2+   Achilles: Left2+, Right 2+  - Babinski: Left Negative, Right Negative  - Clonus: Negative  Special Tests:  - Straight Leg Raise: Left Negative, Right Negative   PV: Extremities warm and well perfused. Posterior and dorsalis pedis pulse 2+ bilaterally, No pitting Edema, discoloration, calf tenderness, or palpable cords. Homan's negative bilaterally.    X-Ray impression:   3vLumbar: Evidence of degenerative scoliosis. Bilateral SI joints visualized and show signs of joint space narrowing bilaterally. Bilateral Hip joints visualized and show no signs of joint space narrowing or other bony abnormalities bilaterally. No evidence of spondylolisthesis. No signs of chronic DDD, well preserved disc  spaces throughout without narrowing. No signs of fractures, bony lesions, or other bony abnormalities.   Lumbar MRI: Developmentally short pedicles. Multilevel facet arthrosis throughout the lumbar spine. Moderate central stenosis at L3-4. There is some crowding of the nerve roots but no severe stenosis. Moderate left foraminal stenosis. There is severe central canal stenosis with moderate biforaminal stenosis at L4-5. Patient does have slight degenerative scoliosis noted in the lumbar spine. No fracture is seen.    A/P: Patient has failed conservative management, therefore we have elected to proceed with the following surgical procedure L4-L5 decompression with in situ fusion on 04/13/2024 at [facility] by Dr. Burnetta.  The patient has not had any improvement of their symptoms with conservative treatment measures.  Helmi had a negative SI joint diagnostic injection. physical therapy for the lumbar spine and SI joint was started and continued for approx. 4 weeks and had worsening pain. She also failed OTC conservative treatment and activity modifications.   On exam, she is neurologically intact and not myelopathic. 5/5 strength in the bilateral lower extremities. negative babinski sign, negative SLR. Patient is tender to palpation of the SI joints and the lower lumbar spine. She does have occasional dysesthesias bilaterally.   3vLumbar: Evidence of degenerative scoliosis. Bilateral SI joints visualized and show signs of joint space narrowing bilaterally. Bilateral Hip joints visualized and show no signs of joint space narrowing or other bony abnormalities bilaterally. No evidence of spondylolisthesis. No signs of chronic DDD, well preserved disc spaces throughout without narrowing. No signs of fractures, bony lesions, or other bony abnormalities.  Lumbar MRI: completed on 11/16/2023: Developmentally short pedicles. Multilevel facet arthrosis throughout the lumbar spine. Moderate central stenosis at  L3-4. There is some crowding of the nerve roots but no severe stenosis. Moderate left foraminal stenosis. There is severe central canal stenosis with moderate biforaminal stenosis at L4-5. Patient does have slight degenerative scoliosis noted in the lumbar spine. No fracture is seen. Based on clinical exam findings and imaging studies we do believe that surgery is warranted at this time in order to decrease pain and loss of quality of life.  The surgical intervention that we would recommend would be Lumbar Four to Lumbar Five decompression with In Situ fusion  The risks of surgery were discussed today with the patient, below are the surgical risks we did discuss.  Risks and benefits of surgery were discussed with the patient. These include: Infection, bleeding, death, stroke, paralysis, ongoing or worse pain, need for additional surgery, leak of spinal fluid, adjacent segment degeneration requiring additional surgery, post-operative hematoma formation that can result in neurological compromise and the need for urgent/emergent re-operation. Loss in bowel and bladder control. Injury to major vessels that could result in the need for urgent abdominal surgery to stop bleeding. Risk of deep venous thrombosis (DVT) and the need for additional treatment. Recurrent disc herniation resulting in the need for revision surgery, which could include fusion surgery (utilizing instrumentation such as pedicle screws and intervertebral cages).  Additional risk: If instrumentation is used to address spinal stenosis there is a risk of migration, or breakage of that hardware that could require additional surgery.  Goal of surgery: Reduce (not eliminate) pain, and improve quality of life.   Diagnosis: Lumbar spinal stenosis and neurogenic claudication of L4-L5  Post-operative care plans were discussed with the patient today and all patient questions were answered.

## 2024-04-13 NOTE — Plan of Care (Signed)

## 2024-04-13 NOTE — Discharge Instructions (Signed)

## 2024-04-13 NOTE — Transfer of Care (Signed)
 Immediate Anesthesia Transfer of Care Note  Patient: [name]  Procedure(s) Performed: LUMBAR DECOMPRESSION OF LUMBAR FOUR-LUMBAR FIVE WITH INSTRUMENTED FUSION (Spine Lumbar)  Patient Location: PACU  Anesthesia Type:General  Level of Consciousness: sedated and responds to stimulation  Airway & Oxygen Therapy: Patient Spontanous Breathing and Patient connected to face mask oxygen  Post-op Assessment: Report given to RN and Post -op Vital signs reviewed and stable  Post vital signs: Reviewed and stable  Last Vitals:  Vitals Value Taken Time  BP 135/48 04/13/24 13:45  Temp 36.4 C 04/13/24 13:45  Pulse 68 04/13/24 13:47  Resp 16 04/13/24 13:47  SpO2 100 % 04/13/24 13:47  Vitals shown include unfiled device data.  Last Pain:  Vitals:   04/13/24 0940  TempSrc:   PainSc: 3       Patients Stated Pain Goal: 0 (04/13/24 0940)  Complications: No notable events documented.

## 2024-04-13 NOTE — Brief Op Note (Signed)
 03/05/2024   10:23 AM   PATIENT:  04/13/2024  1:27 PM  PATIENT:  [name]  73 y.o. female  PRE-OPERATIVE DIAGNOSIS:  lumbar spinal stenosis with neurogenic claudication  POST-OPERATIVE DIAGNOSIS:  lumbar spinal stenosis with neurogenic claudication  PROCEDURE:  Procedure(s): LUMBAR DECOMPRESSION OF LUMBAR FOUR-LUMBAR FIVE WITH INSTRUMENTED FUSION (N/A)  SURGEON:  Surgeons and Role:    DEWAINE Burnetta Aures, MD - Primary  PHYSICIAN ASSISTANT: Jeoffrey Sages, PA  ANESTHESIA:   general  EBL:  25 mL   BLOOD ADMINISTERED:none  DRAINS: none   LOCAL MEDICATIONS USED:  MARCAINE     SPECIMEN:  No Specimen  DISPOSITION OF SPECIMEN:  N/A  COUNTS:  YES  TOURNIQUET:  * No tourniquets in log *  DICTATION: .Dragon Dictation  PLAN OF CARE: Admit for overnight observation  PATIENT DISPOSITION:  PACU - hemodynamically stable.

## 2024-04-13 NOTE — Anesthesia Procedure Notes (Signed)
 Procedure Name: Intubation Date/Time: 04/13/2024 11:38 AM  Performed by: Obadiah Reyes BROCKS, CRNAPre-anesthesia Checklist: Patient identified, Emergency Drugs available, Suction available and Patient being monitored Patient Re-evaluated:Patient Re-evaluated prior to induction Oxygen Delivery Method: Circle System Utilized Preoxygenation: Pre-oxygenation with 100% oxygen Induction Type: IV induction Ventilation: Mask ventilation without difficulty Laryngoscope Size: Miller and 2 Grade View: Grade I Tube type: Oral Number of attempts: 1 Airway Equipment and Method: Stylet and Oral airway Placement Confirmation: ETT inserted through vocal cords under direct vision, positive ETCO2 and breath sounds checked- equal and bilateral Secured at: 20 cm Tube secured with: Tape Dental Injury: Teeth and Oropharynx as per pre-operative assessment

## 2024-04-13 NOTE — Op Note (Signed)
 OPERATIVE REPORT  DATE OF SURGERY: 04/13/2024  PATIENT NAME:  [name] MRN: [id number] DOB: 29-Sep-1950  PCP: Claudene Pellet, MD  PRE-OPERATIVE DIAGNOSIS: Degenerative lumbar scoliosis with spinal stenosis and neurogenic claudication L4-5  POST-OPERATIVE DIAGNOSIS: Same  PROCEDURE:   Lumbar decompression L4-5 with in situ fusion L4-5  SURGEON:  Donaciano Sprang, MD  PHYSICIAN ASSISTANT: Jeoffrey Sages, PA  ANESTHESIA:   General  EBL: 25 ml   Complications: None  Implants: None  Graft: Autograft from decompression  BRIEF HISTORY: [name] is a 73 y.o. female who presented to my office with complaints of significant back buttock and neuropathic leg pain.  Clinical exam was consistent with lumbar spinal stenosis with neurogenic claudication.  Imaging confirmed severe central, foraminal, and lateral recess stenosis L4-5 causing nerve compression.  As result of the failure of conservative management to improve her quality of life we elected to move forward with surgery.  X-rays also demonstrated a slight degenerative scoliosis at this level.  In order to prevent worsening of the deformity I elected to move forward with an in situ fusion following the decompression.  All risks, benefits, and alternatives to surgery were discussed with the patient and her family and consent was obtained.  PROCEDURE DETAILS: Patient was brought into the operating room and was properly positioned on the operating room table.  After induction with general anesthesia the patient was endotracheally intubated.  A timeout was taken to confirm all important data: including patient, procedure, and the level. Teds, SCD's were applied.   Foley was placed by the nurse when patient was turned prone onto the Wilson frame.  The back was prepped and draped in the standard fashion.  2 needles were then placed in the lumbar spine and x-ray was taken to localize our skin incision.  Once the skin incision was  localized I marked out my midline incision and infiltrated with quarter percent Marcaine with epinephrine.  Midline incision was made and sharp dissection was carried out down to the deep fascia.  I incised the deep fascia stripped the paraspinal muscles to expose spinous processes of L4 and L5 as well as the L4-5 lamina and the L4-5 and L3-4 facet complexes.  A Penfield 4 was placed under the L4 lamina and a second x-ray was taken confirming I was at the appropriate level.  Upon inspection there was significant thickening of the ligamentum flavum, marked facet arthrosis consistent with what was seen on the preoperative MRI.  The inferior two thirds of the spinous process of L4 and the superior one third of L5 was removed with a double-action Leksell rongeur.  Laminotomy was initially performed with a Leksell rongeur and continued with Kerrison rongeurs.  There was significant thickening of the ligamentum flavum.  Once I had completed laminotomy I then used a Penfield 4 to dissect through the central raphae of the ligamentum flavum and develop a plane tween the ligamentum flavum and the thecal sac.  There was significant thickening of the ligamentum flavum noted.  Using Kerrison rongeurs I removed all of the ligamentum flavum to complete my central decompression.  Were standing on the left-hand side I worked into the contralateral recess to further decompress the area.  I ultimately was able to identify the medial border of the L5 pedicle and I carried out my medial facetectomy until I was in line with this marker.  Large epidural veins came into view and they were isolated and coagulated with bipolar cautery.  The L5 nerve root  was then traced into the foramen and a foraminotomy was performed.  The leading edge of the L5 lamina was also resected to the completely decompressed the area.  Once this right side was fully decompressed I could easily pass my nerve hook superiorly towards the L4 foramen inferiorly along  the lateral recess and into the L5 foramen.  I completely decompressed the area of maximum compression is seen on the preoperative MRI.  I then went to the contralateral side and using the same technique performed a left lateral recess decompression, as well as a L5 foraminotomy.  I again the large epidural veins were isolated and coagulated with bipolar cautery.  The leading edge of the L5 lamina was taken down to complete my central decompression.  At this point I could easily pass my nerve hook superiorly and inferiorly and out the L5 foramen confirming my adequate decompression.  Once the decompression proved satisfactory I then exposed the L4-5 facet complex and removed it with the Bovie.  I then dissected laterally to expose the L5 transverse process.  I then identified the L3-4 facet and left the capsule intact.  I then dissected lateral until I could palpate the L4 transverse process.  Once this was exposed I used a high-speed bur to decorticate the L4 transverse process as well as the L5 and the lateral portion of the L4-5 facet complex.  I then packed the posterior lateral gutter with the bone graft that I harvested from the decompression.  I performed a similar posterior lateral arthrodesis on the contralateral side.  After the decompression proved satisfactory and the bone graft was then placed I irrigated again and confirmed that hemostasis.  I then placed a thrombin-soaked Gelfoam patty over the exposed laminotomy site and removed my retractors.  The deep fascia was then closed with interrupted #1 Vicryl sutures followed by interrupted 2-0 Vicryl sutures and finally a 3-0 Monocryl for the skin.  Steri-Strips and dry dressings were applied and the patient was ultimately extubated and transferred the PACU without incident.  At the end of the case all needle sponge counts were correct.  Donaciano Sprang, MD 04/13/2024 1:19 PM

## 2024-04-14 DIAGNOSIS — I130 Hypertensive heart and chronic kidney disease with heart failure and stage 1 through stage 4 chronic kidney disease, or unspecified chronic kidney disease: Secondary | ICD-10-CM | POA: Diagnosis not present

## 2024-04-14 DIAGNOSIS — I5042 Chronic combined systolic (congestive) and diastolic (congestive) heart failure: Secondary | ICD-10-CM | POA: Diagnosis not present

## 2024-04-14 DIAGNOSIS — N182 Chronic kidney disease, stage 2 (mild): Secondary | ICD-10-CM | POA: Diagnosis not present

## 2024-04-14 DIAGNOSIS — E1122 Type 2 diabetes mellitus with diabetic chronic kidney disease: Secondary | ICD-10-CM | POA: Diagnosis not present

## 2024-04-14 DIAGNOSIS — Z951 Presence of aortocoronary bypass graft: Secondary | ICD-10-CM | POA: Diagnosis not present

## 2024-04-14 DIAGNOSIS — I2510 Atherosclerotic heart disease of native coronary artery without angina pectoris: Secondary | ICD-10-CM | POA: Diagnosis not present

## 2024-04-14 DIAGNOSIS — Z7984 Long term (current) use of oral hypoglycemic drugs: Secondary | ICD-10-CM | POA: Diagnosis not present

## 2024-04-14 DIAGNOSIS — M4800 Spinal stenosis, site unspecified: Secondary | ICD-10-CM | POA: Diagnosis not present

## 2024-04-14 DIAGNOSIS — Z7982 Long term (current) use of aspirin: Secondary | ICD-10-CM | POA: Diagnosis not present

## 2024-04-14 DIAGNOSIS — M48062 Spinal stenosis, lumbar region with neurogenic claudication: Secondary | ICD-10-CM | POA: Diagnosis not present

## 2024-04-14 DIAGNOSIS — Z79899 Other long term (current) drug therapy: Secondary | ICD-10-CM | POA: Diagnosis not present

## 2024-04-14 DIAGNOSIS — M5416 Radiculopathy, lumbar region: Secondary | ICD-10-CM | POA: Diagnosis not present

## 2024-04-14 DIAGNOSIS — M4186 Other forms of scoliosis, lumbar region: Secondary | ICD-10-CM | POA: Diagnosis not present

## 2024-04-14 DIAGNOSIS — Z87891 Personal history of nicotine dependence: Secondary | ICD-10-CM | POA: Diagnosis not present

## 2024-04-14 LAB — GLUCOSE, CAPILLARY: Glucose-Capillary: 183 mg/dL — ABNORMAL HIGH (ref 70–99)

## 2024-04-14 NOTE — Progress Notes (Signed)
 Patient alert and oriented, mae's well, voiding adequate amount of urine, swallowing without difficulty, no c/o pain at time of discharge. Patient discharged home with family. Script and discharged instructions given to patient. Patient and family stated understanding of instructions given. Patient has an appointment with Dr. Burnetta In 2 weeks. RN gave patient magnesium  Citrate to use at home.

## 2024-04-14 NOTE — Progress Notes (Signed)
 PT Cancellation Note  Patient Details Name: [name] MRN: [id number] DOB: 19-Feb-1951   Cancelled Treatment:    Reason Eval/Treat Not Completed: PT screened, no needs identified, will sign off. Discussed pt case with OT who reports pt is currently mobilizing without assistance and does not require a formal PT evaluation at this time. PT signing off. If needs change, please reconsult.     Leita JONETTA Sable 04/14/2024, 8:31 AM  Leita Sable, PT, DPT Acute Rehabilitation Services Secure Chat Preferred Office: [phone]

## 2024-04-14 NOTE — Care Management Obs Status (Signed)
 MEDICARE OBSERVATION STATUS NOTIFICATION   Patient Details  Name: [name] MRN: [id number] Date of Birth: 1950/11/21   Medicare Observation Status Notification Given:  Yes    Annamary Buschman 04/14/2024, 10:34 AM

## 2024-04-14 NOTE — Evaluation (Addendum)
 Occupational Therapy Evaluation Patient Details Name: [name] MRN: [id number] DOB: [birth year] Today's Date: 04/14/2024   History of Present Illness   73 y/o F s/p L4-5 decompression and fusion on 10/13. PMH includes anxiety, arthritis, CAD, anemia, CKD, depression, GERD, HLD, HTN, OSA on CPAP     Clinical Impressions Pt ind at baseline with ADLs/functional mobility, will have 24/7 assist from family at home at d/c. Pt currently needs up to CGA for ADLs, mod I for bed mobility via log roll technique, and supervision for transfers without AD. Pt educated on back precautions (handout provided), brace wear, and compensatory strategies for ADLS/mobility and pt able to demo during session. Pt presenting with impairments listed below, however has no further OT needs at this time, will s/o. Please reconsult if there is a change in pt status. Anticipate no OT follow up needs at d/c.      If plan is discharge home, recommend the following:   A little help with walking and/or transfers;A little help with bathing/dressing/bathroom;Assistance with cooking/housework;Help with stairs or ramp for entrance;Assist for transportation     Functional Status Assessment   Patient has had a recent decline in their functional status and demonstrates the ability to make significant improvements in function in a reasonable and predictable amount of time.     Equipment Recommendations   None recommended by OT     Recommendations for Other Services         Precautions/Restrictions   Precautions Precautions: Back Precaution Booklet Issued: Yes (comment) Recall of Precautions/Restrictions: Intact Required Braces or Orthoses: Spinal Brace Spinal Brace: Lumbar corset;Applied in sitting position Restrictions Weight Bearing Restrictions Per Provider Order: No     Mobility Bed Mobility Overal bed mobility: Modified Independent                  Transfers Overall transfer level:  Needs assistance Equipment used: None Transfers: Sit to/from Stand Sit to Stand: Supervision                  Balance Overall balance assessment: Mild deficits observed, not formally tested                                         ADL either performed or assessed with clinical judgement   ADL Overall ADL's : Needs assistance/impaired Eating/Feeding: Independent   Grooming: Independent   Upper Body Bathing: Contact guard assist   Lower Body Bathing: Contact guard assist   Upper Body Dressing : Contact guard assist   Lower Body Dressing: Contact guard assist   Toilet Transfer: Contact guard assist   Toileting- Clothing Manipulation and Hygiene: Contact guard assist       Functional mobility during ADLs: Contact guard assist       Vision   Vision Assessment?: No apparent visual deficits     Perception Perception: Not tested       Praxis Praxis: Not tested       Pertinent Vitals/Pain Pain Assessment Pain Assessment: No/denies pain     Extremity/Trunk Assessment Upper Extremity Assessment Upper Extremity Assessment: Generalized weakness   Lower Extremity Assessment Lower Extremity Assessment: Generalized weakness   Cervical / Trunk Assessment Cervical / Trunk Assessment: Back Surgery   Communication Communication Communication: No apparent difficulties   Cognition Arousal: Alert Behavior During Therapy: WFL for tasks assessed/performed Cognition: No apparent impairments  Following commands: Intact       Cueing  General Comments   Cueing Techniques: Verbal cues  VSS on RA   Exercises     Shoulder Instructions      Home Living Family/patient expects to be discharged to:: Private residence Living Arrangements: Spouse/significant other;Children Available Help at Discharge: Family;Available 24 hours/day Type of Home: House Home Access: Stairs to enter [employer]  of Steps: 3 Entrance Stairs-Rails: Left Home Layout: One level     Bathroom Shower/Tub: Producer, television/film/video: Standard     Home Equipment: Agricultural consultant (2 wheels);Cane - single point;BSC/3in1;Wheelchair - manual          Prior Functioning/Environment Prior Level of Function : Independent/Modified Independent             Mobility Comments: no AD use ADLs Comments: ind    OT Problem List:     OT Treatment/Interventions:        OT Goals(Current goals can be found in the care plan section)   Acute Rehab OT Goals Patient Stated Goal: to go home OT Goal Formulation: With patient Time For Goal Achievement: 04/28/24 Potential to Achieve Goals: Good   OT Frequency:       Co-evaluation              AM-PAC OT 6 Clicks Daily Activity     Outcome Measure Help from another person eating meals?: None Help from another person taking care of personal grooming?: A Little Help from another person toileting, which includes using toliet, bedpan, or urinal?: A Little Help from another person bathing (including washing, rinsing, drying)?: A Little Help from another person to put on and taking off regular upper body clothing?: A Little Help from another person to put on and taking off regular lower body clothing?: A Little 6 Click Score: 19   End of Session Equipment Utilized During Treatment: Gait belt;Back brace Nurse Communication: Mobility status  Activity Tolerance: Patient tolerated treatment well Patient left: in bed;with call bell/phone within reach                   Time: 9254-9187 OT Time Calculation (min): 27 min Charges:  OT General Charges $OT Visit: 1 Visit OT Evaluation $OT Eval Low Complexity: 1 Low OT Treatments $Self Care/Home Management : 8-22 mins  Geniece Akers K, OTD, OTR/L SecureChat Preferred Acute Rehab [phone]   Yarnell Kozloski K Koonce 04/14/2024, 8:33 AM

## 2024-04-14 NOTE — Discharge Summary (Signed)
 Patient ID: [name] MRN: [id number] DOB/AGE: [birth year] 73 y.o.  Admit date: 04/13/2024 Discharge date: 04/14/2024  Admission Diagnoses:  Principal Problem:   Spinal stenosis   Discharge Diagnoses:  Principal Problem:   Spinal stenosis  status post Procedure(s): LUMBAR DECOMPRESSION OF LUMBAR FOUR-LUMBAR FIVE WITH INSTRUMENTED FUSION  Past Medical History:  Diagnosis Date   An[facility]    [facility] I've had transfusion for low iron (04/17/2016)   Anxiety    Arthritis    left hip, fingers  (04/17/2016)   At risk for medication noncompliance    Atypical chest pain    a. atypical symptoms since prior cath.   CAD (coronary artery disease)    a. s/p 2007  LAD stent. b. s/p 07/2006 CABG with LIMA-LAD & SVG to Diag. c. 12/2006 diagonal branch w/ 2.5 x 12 mm Endeavor stent. d. s/p 2010 cath with small vessel disease treated with RX, stable by repeat 2015. e. CP/abnl nuc -> LHC 04/17/16: new ostial stenosis of LIMA-LAD s/p DES, EF 45%, chronically occ SVG-D2. f. Stable cath 12/2016.   Chronic combined systolic and diastolic CHF (congestive heart failure) (HCC)    CKD (chronic kidney disease)    CKD (chronic kidney disease), stage II    Depression    GERD (gastroesophageal reflux disease)    Headache(784.0)    maybe once/month (04/17/2016)   Hyperlipidemia    Hypertension    Migraines    couple times/yr  ((04/17/2016)   Myocardial infarction [facility]) ?2008   Obesity    OSA on CPAP    suppose to wear mask; can't find it (10/17//2017) in process of trying for bipap   Osteopenia 12/2010 &01/21/2013   Minimal of hip DEXA    Pneumonia    several years (04/17/2016)   Retinopathy 06/2007   right   RLS (restless legs syndrome)    Thyroid  disease    blood work didn't show it but my dr said he can tell by looking at me; I had radiation (04/17/2016)   Type II diabetes mellitus (HCC)     Surgeries: Procedure(s): LUMBAR DECOMPRESSION OF LUMBAR  FOUR-LUMBAR FIVE WITH INSTRUMENTED FUSION on 04/13/2024   Consultants:   Discharged Condition: Improved  Hospital Course: [name] is an 73 y.o. female who was admitted 04/13/2024 for operative treatment of Spinal stenosis. Patient failed conservative treatments (please see the history and physical for the specifics) and had severe unremitting pain that affects sleep, daily activities and work/hobbies. After pre-op  clearance, the patient was taken to the operating room on 04/13/2024 and underwent  Procedure(s): LUMBAR DECOMPRESSION OF LUMBAR FOUR-LUMBAR FIVE WITH INSTRUMENTED FUSION.    Patient was given perioperative antibiotics:  Anti-infectives (From admission, onward)    Start     Dose/Rate Route Frequency Ordered Stop   04/13/24 2000  ceFAZolin (ANCEF) IVPB 1 g/50 mL premix        1 g 100 mL/hr over 30 Minutes Intravenous Every 8 hours 04/13/24 1456 04/14/24 0343   04/13/24 1015  ceFAZolin (ANCEF) IVPB 2g/100 mL premix        2 g 200 mL/hr over 30 Minutes Intravenous 30 min pre-op  04/13/24 0930 04/13/24 1144        Patient was given sequential compression devices and early ambulation to prevent DVT.   Patient benefited maximally from hospital stay and there were no complications. At the time of discharge, the patient was urinating/moving their bowels without difficulty, tolerating a regular diet, pain is  controlled with oral pain medications and they have been cleared by PT/OT.   Recent vital signs: Patient Vitals for the past 24 hrs:  BP Temp Temp src Pulse Resp SpO2 Height Weight  04/14/24 0429 (!) 145/70 (!) 97.4 F (36.3 C) Oral 81 18 99 % -- --  04/13/24 2347 (!) 115/52 98.4 F (36.9 C) Oral 87 16 97 % -- --  04/13/24 1942 (!) 125/44 98.4 F (36.9 C) Oral 91 16 97 % -- --  04/13/24 1553 (!) 123/53 97.6 F (36.4 C) Oral 71 19 100 % -- --  04/13/24 1518 (!) 155/59 98.8 F (37.1 C) Oral 70 19 -- -- --  04/13/24 1445 (!) 148/52 (!) 97.5 F (36.4 C) -- 69 12 95 %  -- --  04/13/24 1430 (!) 145/71 -- -- 77 20 92 % -- --  04/13/24 1415 138/63 -- -- 71 20 95 % -- --  04/13/24 1400 (!) 136/54 -- -- 73 18 95 % -- --  04/13/24 1345 (!) 135/48 97.6 F (36.4 C) -- 67 16 100 % -- --  04/13/24 0851 (!) 150/60 97.8 F (36.6 C) Oral 73 18 98 % 4' 11 (1.499 m) 76.7 kg     Recent laboratory studies: No results for input(s): WBC, HGB, HCT, PLT, NA, K, CL, CO2, BUN, CREATININE, GLUCOSE, INR, CALCIUM  in the last 72 hours.  Invalid input(s): PT, 2   Discharge Medications:   Allergies as of 04/14/2024   No Known Allergies      Medication List     TAKE these medications    acetaminophen  500 MG tablet Commonly known as: TYLENOL  Take 2 tablets (1,000 mg total) by mouth every 6 (six) hours as needed for moderate pain or headache.   aspirin  EC 81 MG tablet Take 1 tablet (81 mg total) by mouth daily.   atorvastatin  80 MG tablet Commonly known as: LIPITOR  Take 80 mg by mouth at bedtime.   cyanocobalamin 1000 MCG tablet Commonly known as: VITAMIN B12 Take 1,000 mcg by mouth in the morning.   ezetimibe  10 MG tablet Commonly known as: ZETIA  TAKE 1 TABLET BY MOUTH DAILY   fenofibrate  145 MG tablet Commonly known as: TRICOR  TAKE 1 TABLET BY MOUTH DAILY   furosemide  20 MG tablet Commonly known as: LASIX  Take 20 mg by mouth in the morning.   glimepiride 4 MG tablet Commonly known as: AMARYL Take 4 mg by mouth 2 (two) times daily.   Glyxambi 25-5 MG Tabs Generic drug: Empagliflozin-linaGLIPtin Take 1 tablet by mouth in the morning.   icosapent  Ethyl 1 g capsule Commonly known as: Vascepa  Take 2 capsules (2 g total) by mouth 2 (two) times daily.   isosorbide  mononitrate 60 MG 24 hr tablet Commonly known as: IMDUR  Take 1 tablet (60 mg total) by mouth daily.   Magnesium  400 MG Caps Take 400 mg by mouth in the morning.   metFORMIN  500 MG tablet Commonly known as: GLUCOPHAGE  Take 500 mg by mouth in the morning  and at bedtime.   methocarbamol 500 MG tablet Commonly known as: ROBAXIN Take 1 tablet (500 mg total) by mouth every 8 (eight) hours as needed for up to 5 days for muscle spasms.   metoprolol  tartrate 25 MG tablet Commonly known as: LOPRESSOR  Take 1 tablet (25 mg total) by mouth 2 (two) times daily.   nitroGLYCERIN  0.4 MG SL tablet Commonly known as: NITROSTAT  DISSOLVE 1 TABLET UNDER THE  TONGUE EVERY 5 MINUTES AS NEEDED FOR CHEST PAIN. MAX  OF 3 TABLETS IN 15 MINUTES. CALL 911 IF PAIN  PERSISTS.   ondansetron  4 MG tablet Commonly known as: Zofran  Take 1 tablet (4 mg total) by mouth every 8 (eight) hours as needed for nausea or vomiting.   oxyCODONE-acetaminophen  10-325 MG tablet Commonly known as: Percocet Take 1 tablet by mouth every 6 (six) hours as needed for up to 5 days for pain.   ranolazine  500 MG 12 hr tablet Commonly known as: RANEXA  TAKE 1 TABLET BY MOUTH TWICE  DAILY   Vitamin D3 250 MCG (10000 UT) Tabs Take 250 mcg by mouth in the morning.        Diagnostic Studies: DG Lumbar Spine 2-3 Views Result Date: 04/13/2024 CLINICAL DATA:  Elective surgery. EXAM: LUMBAR SPINE - 2-3 VIEW COMPARISON:  None Available. FINDINGS: Two portable cross-table lateral views of the lumbar spine submitted from the operating room. Film 1 demonstrates surgical instruments localizing posteriorly at the L4-L5 and L5-S1 level. Film 2 demonstrates surgical instruments posteriorly at the L5 level. IMPRESSION: Intraoperative localization during lumbar surgery. Electronically Signed   By: Andrea Gasman M.D.   On: 04/13/2024 17:02       Follow-up Information     Burnetta Aures, MD. Schedule an appointment as soon as possible for a visit in 2 week(s).   Specialty: Orthopedic Surgery Why: If symptoms worsen, For suture removal, For wound re-check Contact information: [address] [place] [address] [phone]                 Discharge Plan:  discharge to  home today   Disposition:  Javon is a very pleasant 73  year old female who is POD 1 from Lumbar decompression of L4-L5 with in situ fusion. Surgical intervention was successful and without complications. Her hospital course has been uncomplicated. She states that her pre-operative leg pain is much improved since surgery. She is ambulating on her own. She is tolerating oral intake well. She is compliant with the LSO brace. She is complaint with the incentive spirometer. She reports that her pain is well controled with oral pain medications. Dressing is c/d/I. Positive void, positive flatus, negative BM. Patient understands that she can shower 5 days after surgery. She will see us  in clinic in 2 weeks for her first post-op appointment. All questions welcomed and answered.     Signed: Jeoffrey LOISE Sages PA-C for Dr. Aures Burnetta [facility] [phone] 04/14/2024, 7:37 AM

## 2024-04-16 NOTE — Anesthesia Postprocedure Evaluation (Signed)
 Anesthesia Post Note  Patient: [name]  Procedure(s) Performed: LUMBAR DECOMPRESSION OF LUMBAR FOUR-LUMBAR FIVE WITH INSTRUMENTED FUSION (Spine Lumbar)     Patient location during evaluation: PACU Anesthesia Type: General Level of consciousness: awake and alert Pain management: pain level controlled Vital Signs Assessment: post-procedure vital signs reviewed and stable Respiratory status: spontaneous breathing, nonlabored ventilation and respiratory function stable Cardiovascular status: blood pressure returned to baseline and stable Postop Assessment: no apparent nausea or vomiting Anesthetic complications: no   No notable events documented.                Maryanne Huneycutt

## 2024-04-17 ENCOUNTER — Other Ambulatory Visit: Payer: Self-pay | Admitting: Cardiology

## 2024-06-12 ENCOUNTER — Other Ambulatory Visit: Payer: Self-pay | Admitting: Cardiology

## 2024-07-15 NOTE — Progress Notes (Signed)
 [name]                                          MRN: [id number]   07/15/2024   The [facility] Quality Team Specialist reviewed this patient medical record for the purposes of chart review for care gap closure. The following were reviewed: abstraction for care gap closure-glycemic status assessment.    [facility] Quality Team

## 2024-07-22 ENCOUNTER — Other Ambulatory Visit: Payer: Self-pay | Admitting: Cardiology

## 2024-07-29 NOTE — Telephone Encounter (Signed)
 Lipids Completed on 08/26/23

## 2024-08-04 ENCOUNTER — Telehealth: Payer: Self-pay | Admitting: Cardiology

## 2024-08-04 NOTE — Telephone Encounter (Signed)
" °*  STAT* If patient is at the pharmacy, call can be transferred to refill team.   1. Which medications need to be refilled? (please list name of each medication and dose if known)   VASCEPA  1 g capsule    2. Would you like to learn more about the convenience, safety, & potential cost savings by using the [facility]? no   3. Are you open to using the [facility] (Type [facility]. no   4. Which pharmacy/location (including street and city if local pharmacy) is medication to be sent to?   [id number] - [place], [place] - [address]    5. Do they need a 30 day or 90 day supply? 90 day     Pt is completely out.  "

## 2024-08-05 NOTE — Telephone Encounter (Signed)
 Pt aware that [facility] has [facility] prescription for Vascepa  that was written 06/2024 with 3 additional refills.  I did confirm with the pharmacy.

## 2024-08-06 ENCOUNTER — Other Ambulatory Visit: Payer: Self-pay | Admitting: Cardiology
